# Patient Record
Sex: Male | Born: 1965
Health system: Southern US, Community
[De-identification: ages and names within clinical notes are randomized; demographics above are authoritative.]

## PROBLEM LIST (undated history)

## (undated) DIAGNOSIS — R197 Diarrhea, unspecified: Secondary | ICD-10-CM

## (undated) DIAGNOSIS — N400 Enlarged prostate without lower urinary tract symptoms: Secondary | ICD-10-CM

## (undated) DIAGNOSIS — Z8659 Personal history of other mental and behavioral disorders: Secondary | ICD-10-CM

## (undated) DIAGNOSIS — E785 Hyperlipidemia, unspecified: Secondary | ICD-10-CM

## (undated) DIAGNOSIS — F32A Depression, unspecified: Secondary | ICD-10-CM

## (undated) DIAGNOSIS — I10 Essential (primary) hypertension: Secondary | ICD-10-CM

## (undated) DIAGNOSIS — M545 Low back pain, unspecified: Secondary | ICD-10-CM

## (undated) DIAGNOSIS — F419 Anxiety disorder, unspecified: Secondary | ICD-10-CM

## (undated) DIAGNOSIS — R112 Nausea with vomiting, unspecified: Secondary | ICD-10-CM

## (undated) DIAGNOSIS — F329 Major depressive disorder, single episode, unspecified: Secondary | ICD-10-CM

## (undated) DIAGNOSIS — G4733 Obstructive sleep apnea (adult) (pediatric): Secondary | ICD-10-CM

## (undated) DIAGNOSIS — Z9889 Other specified postprocedural states: Secondary | ICD-10-CM

## (undated) DIAGNOSIS — G8929 Other chronic pain: Secondary | ICD-10-CM

## (undated) DIAGNOSIS — IMO0002 Reserved for concepts with insufficient information to code with codable children: Secondary | ICD-10-CM

## (undated) DIAGNOSIS — Z8489 Family history of other specified conditions: Secondary | ICD-10-CM

## (undated) DIAGNOSIS — K219 Gastro-esophageal reflux disease without esophagitis: Secondary | ICD-10-CM

## (undated) DIAGNOSIS — Z9989 Dependence on other enabling machines and devices: Secondary | ICD-10-CM

## (undated) HISTORY — DX: Other chronic pain: G89.29

## (undated) HISTORY — DX: Low back pain: M54.5

## (undated) HISTORY — DX: Low back pain, unspecified: M54.50

---
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
## 2001-09-01 HISTORY — PX: APPENDECTOMY: SHX54

## 2007-03-25 ENCOUNTER — Ambulatory Visit: Payer: Self-pay | Admitting: Cardiovascular Disease

## 2007-03-25 ENCOUNTER — Inpatient Hospital Stay (HOSPITAL_COMMUNITY): Admission: EM | Admit: 2007-03-25 | Discharge: 2007-03-27 | Payer: Self-pay | Admitting: Emergency Medicine

## 2007-03-26 ENCOUNTER — Encounter (INDEPENDENT_AMBULATORY_CARE_PROVIDER_SITE_OTHER): Payer: Self-pay | Admitting: *Deleted

## 2007-03-26 HISTORY — PX: TRANSTHORACIC ECHOCARDIOGRAM: SHX275

## 2007-03-30 ENCOUNTER — Ambulatory Visit: Payer: Self-pay | Admitting: Internal Medicine

## 2007-03-30 LAB — CONVERTED CEMR LAB
Hemoglobin, Urine: NEGATIVE
Nitrite: NEGATIVE
Urine Glucose: NEGATIVE mg/dL
WBC, UA: NONE SEEN cells/hpf

## 2007-04-06 ENCOUNTER — Ambulatory Visit: Payer: Self-pay | Admitting: Pulmonary Disease

## 2007-04-13 ENCOUNTER — Ambulatory Visit: Payer: Self-pay | Admitting: Internal Medicine

## 2007-04-28 ENCOUNTER — Ambulatory Visit (HOSPITAL_BASED_OUTPATIENT_CLINIC_OR_DEPARTMENT_OTHER): Admission: RE | Admit: 2007-04-28 | Discharge: 2007-04-28 | Payer: Self-pay | Admitting: Pulmonary Disease

## 2007-04-28 ENCOUNTER — Encounter: Payer: Self-pay | Admitting: Pulmonary Disease

## 2007-05-07 ENCOUNTER — Ambulatory Visit: Payer: Self-pay | Admitting: Pulmonary Disease

## 2007-05-12 ENCOUNTER — Ambulatory Visit: Payer: Self-pay | Admitting: Pulmonary Disease

## 2007-05-28 DIAGNOSIS — F32A Depression, unspecified: Secondary | ICD-10-CM | POA: Insufficient documentation

## 2007-05-28 DIAGNOSIS — Z87448 Personal history of other diseases of urinary system: Secondary | ICD-10-CM | POA: Insufficient documentation

## 2007-05-28 DIAGNOSIS — I10 Essential (primary) hypertension: Secondary | ICD-10-CM | POA: Insufficient documentation

## 2007-05-28 DIAGNOSIS — F329 Major depressive disorder, single episode, unspecified: Secondary | ICD-10-CM

## 2007-05-28 DIAGNOSIS — F411 Generalized anxiety disorder: Secondary | ICD-10-CM | POA: Insufficient documentation

## 2007-05-28 DIAGNOSIS — Z8739 Personal history of other diseases of the musculoskeletal system and connective tissue: Secondary | ICD-10-CM | POA: Insufficient documentation

## 2007-05-28 DIAGNOSIS — J301 Allergic rhinitis due to pollen: Secondary | ICD-10-CM | POA: Insufficient documentation

## 2007-05-29 ENCOUNTER — Encounter: Payer: Self-pay | Admitting: Internal Medicine

## 2007-05-29 DIAGNOSIS — G4733 Obstructive sleep apnea (adult) (pediatric): Secondary | ICD-10-CM | POA: Insufficient documentation

## 2007-06-22 ENCOUNTER — Ambulatory Visit: Payer: Self-pay | Admitting: Pulmonary Disease

## 2007-07-13 ENCOUNTER — Ambulatory Visit: Payer: Self-pay | Admitting: Internal Medicine

## 2007-07-13 DIAGNOSIS — J309 Allergic rhinitis, unspecified: Secondary | ICD-10-CM | POA: Insufficient documentation

## 2007-07-13 DIAGNOSIS — R42 Dizziness and giddiness: Secondary | ICD-10-CM | POA: Insufficient documentation

## 2007-09-16 ENCOUNTER — Encounter: Payer: Self-pay | Admitting: Internal Medicine

## 2007-10-14 ENCOUNTER — Ambulatory Visit: Payer: Self-pay | Admitting: Internal Medicine

## 2007-10-21 ENCOUNTER — Telehealth: Payer: Self-pay | Admitting: Pulmonary Disease

## 2007-11-23 ENCOUNTER — Ambulatory Visit: Payer: Self-pay | Admitting: Internal Medicine

## 2008-01-08 ENCOUNTER — Emergency Department (HOSPITAL_COMMUNITY): Admission: EM | Admit: 2008-01-08 | Discharge: 2008-01-08 | Payer: Self-pay | Admitting: Emergency Medicine

## 2008-01-10 ENCOUNTER — Telehealth (INDEPENDENT_AMBULATORY_CARE_PROVIDER_SITE_OTHER): Payer: Self-pay | Admitting: *Deleted

## 2008-01-11 ENCOUNTER — Encounter: Payer: Self-pay | Admitting: Endocrinology

## 2008-01-11 ENCOUNTER — Ambulatory Visit: Payer: Self-pay | Admitting: Internal Medicine

## 2008-01-11 DIAGNOSIS — K1370 Unspecified lesions of oral mucosa: Secondary | ICD-10-CM | POA: Insufficient documentation

## 2008-01-11 DIAGNOSIS — B009 Herpesviral infection, unspecified: Secondary | ICD-10-CM | POA: Insufficient documentation

## 2008-02-23 ENCOUNTER — Ambulatory Visit: Payer: Self-pay | Admitting: Internal Medicine

## 2008-02-24 ENCOUNTER — Encounter: Payer: Self-pay | Admitting: Internal Medicine

## 2008-03-09 ENCOUNTER — Telehealth: Payer: Self-pay | Admitting: Internal Medicine

## 2008-04-12 ENCOUNTER — Ambulatory Visit: Payer: Self-pay | Admitting: Internal Medicine

## 2008-04-12 DIAGNOSIS — L989 Disorder of the skin and subcutaneous tissue, unspecified: Secondary | ICD-10-CM | POA: Insufficient documentation

## 2008-04-12 LAB — CONVERTED CEMR LAB
ALT: 24 units/L (ref 0–53)
BUN: 12 mg/dL (ref 6–23)
Basophils Relative: 0.6 % (ref 0.0–3.0)
Bilirubin Urine: NEGATIVE
Bilirubin, Direct: 0.1 mg/dL (ref 0.0–0.3)
CO2: 30 meq/L (ref 19–32)
Chloride: 103 meq/L (ref 96–112)
Creatinine, Ser: 1.3 mg/dL (ref 0.4–1.5)
Direct LDL: 146.3 mg/dL
GFR calc Af Amer: 78 mL/min
GFR calc non Af Amer: 65 mL/min
HCT: 42.6 % (ref 39.0–52.0)
HDL: 37.2 mg/dL — ABNORMAL LOW (ref >39.0)
Hemoglobin, Urine: NEGATIVE
Ketones, ur: NEGATIVE mg/dL
Monocytes Relative: 9.6 % (ref 3.0–12.0)
Neutrophils Relative %: 62.8 % (ref 43.0–77.0)
PSA: 0.7 ng/mL (ref 0.10–4.00)
Platelets: 280 10*3/uL (ref 150–400)
Potassium: 4.5 meq/L (ref 3.5–5.1)
RBC: 5.23 M/uL (ref 4.22–5.81)
RDW: 12.7 % (ref 11.5–14.6)
Specific Gravity, Urine: 1.02 (ref 1.000–1.03)
TSH: 1.04 microintl units/mL (ref 0.35–5.50)
Total Protein, Urine: NEGATIVE mg/dL
Total Protein: 7.5 g/dL (ref 6.0–8.3)
Triglycerides: 125 mg/dL (ref 0–149)
Urine Glucose: NEGATIVE mg/dL

## 2008-04-21 ENCOUNTER — Encounter: Payer: Self-pay | Admitting: Internal Medicine

## 2008-11-09 ENCOUNTER — Telehealth: Payer: Self-pay | Admitting: Internal Medicine

## 2008-11-22 ENCOUNTER — Telehealth (INDEPENDENT_AMBULATORY_CARE_PROVIDER_SITE_OTHER): Payer: Self-pay | Admitting: *Deleted

## 2008-12-18 ENCOUNTER — Telehealth: Payer: Self-pay | Admitting: Internal Medicine

## 2008-12-25 ENCOUNTER — Telehealth (INDEPENDENT_AMBULATORY_CARE_PROVIDER_SITE_OTHER): Payer: Self-pay | Admitting: *Deleted

## 2008-12-27 ENCOUNTER — Telehealth (INDEPENDENT_AMBULATORY_CARE_PROVIDER_SITE_OTHER): Payer: Self-pay | Admitting: *Deleted

## 2009-01-11 ENCOUNTER — Telehealth: Payer: Self-pay | Admitting: Internal Medicine

## 2009-02-08 ENCOUNTER — Ambulatory Visit: Payer: Self-pay | Admitting: Internal Medicine

## 2009-02-08 DIAGNOSIS — R609 Edema, unspecified: Secondary | ICD-10-CM | POA: Insufficient documentation

## 2009-04-09 ENCOUNTER — Ambulatory Visit: Payer: Self-pay | Admitting: Internal Medicine

## 2009-04-09 LAB — CONVERTED CEMR LAB
ALT: 21 units/L (ref 0–53)
AST: 18 units/L (ref 0–37)
Basophils Absolute: 0.1 10*3/uL (ref 0.0–0.1)
Bilirubin Urine: NEGATIVE
Bilirubin, Direct: 0.2 mg/dL (ref 0.0–0.3)
CO2: 32 meq/L (ref 19–32)
Chloride: 105 meq/L (ref 96–112)
Cholesterol: 221 mg/dL — ABNORMAL HIGH (ref 0–200)
Eosinophils Relative: 3 % (ref 0.0–5.0)
GFR calc non Af Amer: 59.04 mL/min (ref >60)
HDL: 37.6 mg/dL — ABNORMAL LOW (ref >39.00)
Lymphocytes Relative: 21.4 % (ref 12.0–46.0)
Monocytes Relative: 8 % (ref 3.0–12.0)
Neutro Abs: 4.8 10*3/uL (ref 1.4–7.7)
Potassium: 4.2 meq/L (ref 3.5–5.1)
RDW: 12.3 % (ref 11.5–14.6)
Total Bilirubin: 0.9 mg/dL (ref 0.3–1.2)
Total Protein, Urine: NEGATIVE mg/dL
Urine Glucose: NEGATIVE mg/dL
WBC: 7.2 10*3/uL (ref 4.5–10.5)

## 2009-04-11 ENCOUNTER — Ambulatory Visit: Payer: Self-pay | Admitting: Internal Medicine

## 2009-04-11 DIAGNOSIS — R6882 Decreased libido: Secondary | ICD-10-CM | POA: Insufficient documentation

## 2009-04-12 LAB — CONVERTED CEMR LAB: Testosterone: 208.09 ng/dL — ABNORMAL LOW (ref 350.00–890.00)

## 2009-04-16 ENCOUNTER — Telehealth: Payer: Self-pay | Admitting: Internal Medicine

## 2009-04-19 ENCOUNTER — Encounter: Payer: Self-pay | Admitting: Internal Medicine

## 2009-04-20 ENCOUNTER — Ambulatory Visit: Payer: Self-pay | Admitting: Internal Medicine

## 2009-04-20 DIAGNOSIS — E291 Testicular hypofunction: Secondary | ICD-10-CM | POA: Insufficient documentation

## 2009-04-20 LAB — CONVERTED CEMR LAB
FSH: 2.4 milliintl units/mL (ref 1.4–18.1)
LH: 2.87 milliintl units/mL (ref 1.50–9.30)

## 2009-04-23 ENCOUNTER — Encounter: Payer: Self-pay | Admitting: Internal Medicine

## 2009-04-25 ENCOUNTER — Encounter: Payer: Self-pay | Admitting: Internal Medicine

## 2009-04-26 LAB — CONVERTED CEMR LAB
Sex Hormone Binding: 16 nmol/L (ref 13–71)
Testosterone-% Free: 2.7 % (ref 1.6–2.9)
Testosterone: 163.15 ng/dL — ABNORMAL LOW (ref 350–890)

## 2009-05-15 ENCOUNTER — Telehealth: Payer: Self-pay | Admitting: Endocrinology

## 2009-05-15 ENCOUNTER — Ambulatory Visit: Payer: Self-pay | Admitting: Endocrinology

## 2009-05-15 DIAGNOSIS — E230 Hypopituitarism: Secondary | ICD-10-CM | POA: Insufficient documentation

## 2009-05-15 LAB — CONVERTED CEMR LAB
Iron: 73 ug/dL (ref 42–165)
Transferrin: 248.5 mg/dL (ref 212.0–360.0)

## 2009-05-17 ENCOUNTER — Telehealth: Payer: Self-pay | Admitting: Endocrinology

## 2009-05-31 ENCOUNTER — Telehealth: Payer: Self-pay | Admitting: Endocrinology

## 2009-06-01 ENCOUNTER — Telehealth: Payer: Self-pay | Admitting: Endocrinology

## 2009-06-01 ENCOUNTER — Ambulatory Visit: Payer: Self-pay | Admitting: Endocrinology

## 2009-06-01 LAB — CONVERTED CEMR LAB
BUN: 20 mg/dL (ref 6–23)
Creatinine, Ser: 1.4 mg/dL (ref 0.4–1.5)

## 2009-06-03 ENCOUNTER — Encounter: Admission: RE | Admit: 2009-06-03 | Discharge: 2009-06-03 | Payer: Self-pay | Admitting: Endocrinology

## 2009-06-04 ENCOUNTER — Telehealth: Payer: Self-pay | Admitting: Internal Medicine

## 2009-06-11 ENCOUNTER — Encounter: Admission: RE | Admit: 2009-06-11 | Discharge: 2009-06-11 | Payer: Self-pay | Admitting: Endocrinology

## 2009-06-12 ENCOUNTER — Ambulatory Visit: Payer: Self-pay | Admitting: Endocrinology

## 2009-06-13 LAB — CONVERTED CEMR LAB: Testosterone: 292.39 ng/dL — ABNORMAL LOW (ref 350.00–890.00)

## 2009-07-12 ENCOUNTER — Ambulatory Visit: Payer: Self-pay | Admitting: Endocrinology

## 2009-07-12 LAB — CONVERTED CEMR LAB: Testosterone: 352.25 ng/dL (ref 350.00–890.00)

## 2009-07-13 ENCOUNTER — Ambulatory Visit: Payer: Self-pay | Admitting: Internal Medicine

## 2009-07-13 DIAGNOSIS — R5383 Other fatigue: Secondary | ICD-10-CM

## 2009-07-13 DIAGNOSIS — R5381 Other malaise: Secondary | ICD-10-CM | POA: Insufficient documentation

## 2009-10-09 ENCOUNTER — Ambulatory Visit: Payer: Self-pay | Admitting: Internal Medicine

## 2009-10-09 DIAGNOSIS — E785 Hyperlipidemia, unspecified: Secondary | ICD-10-CM | POA: Insufficient documentation

## 2009-10-09 LAB — CONVERTED CEMR LAB
Cholesterol: 203 mg/dL — ABNORMAL HIGH (ref 0–200)
Direct LDL: 145.2 mg/dL
HDL: 40.1 mg/dL (ref >39.00)
Total CHOL/HDL Ratio: 5
Triglycerides: 165 mg/dL — ABNORMAL HIGH (ref 0.0–149.0)

## 2009-11-06 ENCOUNTER — Ambulatory Visit: Payer: Self-pay | Admitting: Internal Medicine

## 2009-11-06 LAB — CONVERTED CEMR LAB
ALT: 26 units/L (ref 0–53)
AST: 21 units/L (ref 0–37)
Cholesterol: 146 mg/dL (ref 0–200)
HDL: 48.1 mg/dL (ref >39.00)
Total CHOL/HDL Ratio: 3
Triglycerides: 118 mg/dL (ref 0.0–149.0)
VLDL: 23.6 mg/dL (ref 0.0–40.0)

## 2009-11-13 ENCOUNTER — Telehealth: Payer: Self-pay | Admitting: Internal Medicine

## 2010-01-03 ENCOUNTER — Ambulatory Visit: Payer: Self-pay | Admitting: Endocrinology

## 2010-01-03 LAB — CONVERTED CEMR LAB: Testosterone: 372.9 ng/dL (ref 350.00–890.00)

## 2010-02-13 ENCOUNTER — Encounter: Payer: Self-pay | Admitting: Internal Medicine

## 2010-02-14 ENCOUNTER — Ambulatory Visit: Payer: Self-pay | Admitting: Internal Medicine

## 2010-04-26 ENCOUNTER — Telehealth: Payer: Self-pay | Admitting: Internal Medicine

## 2010-05-14 ENCOUNTER — Encounter: Payer: Self-pay | Admitting: Internal Medicine

## 2010-06-10 ENCOUNTER — Ambulatory Visit: Payer: Self-pay | Admitting: Pulmonary Disease

## 2010-10-02 NOTE — Assessment & Plan Note (Signed)
Summary: needs new c-pap/jd   Visit Type:  Initial Consult Copy to:  Dr Oliver Barre Primary Heaton Sarin/Referring Alejandro Adcox:  Corwin Levins MD  CC:  Pt here to become re-established. Needs supplies for CPAP.  History of Present Illness: 45/M , originally from from [place] to [place]  with severe obstructive sleep apnea .  PSG in 2008 showed AHI 29/h, wt was 252 lbs, CPAP was initiated at 12 cm based on autotitration data, then lowered to 10 cm  His usual bedtime is around 11 p.m. to midnight.  He  generally sleeps on his side.  He used to take a couple of hours sometimes to fall asleep, but now that he takes Ativan at night, his sleep latency is considerably decreased.  He wakes up 3  times occasionally due to back pain but mostly for bathroom visits.  He denies any postvoid sleep latency.  He finally gets out of bed at around 7 a.m. feeling tired, sleepy, with an occasional headache.  He has gained about 35 pounds since his last visit in 2008. Epworth Sleepiness Score 11/24 He has switched from [facility] to [facility] , since it is cheaper for him to obtain supplies. Wife has not noted snoring on current settings.  Preventive Screening-Counseling & Management  Alcohol-Tobacco     Smoking Status: never    What time do you typically go to bed?(between what hours): 11-12  How long does it take you to fall asleep? 5 min  How many times during the night do you wake up? 3x  What time do you get out of bed to start your day? 9:00 am  Do you drive or operate heavy machinery in your occupation? no  How much has your weight changed (up or down) over the past two years? (in pounds): 30 lbs increase  Have you ever had a sleep study before?  If yes,when and where: yes-2008  Do you currently use CPAP ? If so , at what pressure? yes-10  Do you wear oxygen at any time? If yes, how many liters per minute? no Current Medications (verified): 1)  Benazepril Hcl 40 Mg  Tabs (Benazepril Hcl) .Marland Kitchen.. 1  By Mouth Once Daily 2)  Cymbalta 60 Mg  Cpep (Duloxetine Hcl) .Marland Kitchen.. 1 By Mouth Once Daily 3)  Amlodipine Besylate 10 Mg  Tabs (Amlodipine Besylate) .... 1/2  By Mouth Once Daily 4)  Lorazepam 2 Mg  Tabs (Lorazepam) .... 1/2 By Mouth Two Times A Day As Needed 5)  Bystolic 10 Mg Tabs (Nebivolol Hcl) .Marland Kitchen.. 1 By Mouth Once Daily 6)  Hydrochlorothiazide 25 Mg Tabs (Hydrochlorothiazide) .... 1/2 By Mouth Once Daily 7)  Vitamin D3 .... Once Daily 8)  Clomid 50 Mg Tabs (Clomiphene Citrate) .... 1/2 Tab Qd 9)  Omega 3, 6, 9 10)  Multivitamin .Marland Kitchen.. 1 Daily 11)  Pravachol 40 Mg Tabs (Pravastatin Sodium) .Marland Kitchen.. 1 By Mouth Once Daily  Allergies (verified): No Known Drug Allergies  Past History:  Past Medical History: Last updated: 10/09/2009 Depression Hypertension Anxiety/panic UTI, hx of Chronic back pain, hx of Obstructive Sleep Apnea- CPAP Allergic rhinitis Hyperlipidemia  Past Surgical History: Last updated: 05/28/2007 Appendectomy  Family History: Last updated: 05/15/2009 Family History of CAD Male 1st degree relative Family History of Stroke M 1st degree relative Family History Diabetes 1st degree relative Family History Hypertension Family History High cholesterol father with CAD , CHF, s/p stent father with anaplastic thyroid cancer no testosterone or pituitary probs  Social History: Last updated: 10/14/2007  Never Smoked Alcohol use-yes - rare Married no children disabled - LBP and anxiety  Review of Systems       The patient complains of anxiety and depression.  The patient denies shortness of breath with activity, shortness of breath at rest, productive cough, non-productive cough, coughing up blood, chest pain, irregular heartbeats, acid heartburn, indigestion, loss of appetite, weight change, abdominal pain, difficulty swallowing, sore throat, tooth/dental problems, headaches, nasal congestion/difficulty breathing through nose, sneezing, itching, ear ache, hand/feet  swelling, joint stiffness or pain, rash, change in color of mucus, and fever.    Vital Signs:  Patient profile:   45 year old male Height:      67 inches Weight:      287 pounds O2 Sat:      94 % on Room air Temp:     98.7 degrees F oral Pulse rate:   67 / minute BP sitting:   122 / 86  (right arm) Cuff size:   large  Vitals Entered By: Zackery Barefoot CMA (June 10, 2010 4:26 PM)  O2 Flow:  Room air CC: Pt here to become re-established. Needs supplies for CPAP Comments Medications reviewed with patient Verified contact number and pharmacy with patient Zackery Barefoot [facility]  June 10, 2010 4:26 PM    Physical Exam  Additional Exam:  wt 287 June 10, 2010 Gen. Pleasant, well-nourished, in no distress, normal affect ENT - no lesions, no post nasal drip, class 3 airway Neck: No JVD, no thyromegaly, no carotid bruits Lungs: no use of accessory muscles, no dullness to percussion, clear without rales or rhonchi  Cardiovascular: Rhythm regular, heart sounds  normal, no murmurs or gallops, no peripheral edema Abdomen: soft and non-tender, no hepatosplenomegaly, BS normal. Musculoskeletal: No deformities, no cyanosis or clubbing Neuro:  alert, non focal     Impression & Recommendations:  Problem # 1:  SLEEP APNEA, OBSTRUCTIVE (ICD-327.23)  The pathophysiology of obstructive sleep apnea, it's cardiovascular consequences and modes of treatment including CPAP were discussed with the patient in great detail.  Compliance encouraged, wt loss emphasized, asked to avoid meds with sedative side effects, cautioned against driving when sleepy.  ct CPAP 10 cm, obtian download & increase if needed - he may need higher pressure due to wt gain CMN fille dout.  Orders: Consultation Level III (16109)  Patient Instructions: 1)  Copy sent to: dr Jonny Ruiz 2)  Please schedule a follow-up appointment in 1 year. 3)  Send in the chip to [facility] so I can look at download 4)  New mask  will be provided   Not Administered:    Pneumonia Vaccine not given due to: declined    Influenza Vaccine not given due to: declined   Appended Document: Orders Update    Clinical Lists Changes  Orders: Added new Referral order of DME Referral (DME) - Signed

## 2010-10-02 NOTE — Letter (Signed)
Summary: Physician's statement/[facility]  Physician's statement/[facility]   Imported By: Lester Reinbeck 02/15/2010 08:27:37  _____________________________________________________________________  External Attachment:    Type:   Image     Comment:   External Document

## 2010-10-02 NOTE — Letter (Signed)
Summary: LMN/[facility]  LMN/[facility]   Imported By: Lester  07/30/2010 12:49:27  _____________________________________________________________________  External Attachment:    Type:   Image     Comment:   External Document

## 2010-10-02 NOTE — Assessment & Plan Note (Signed)
Summary: 6 mo ov /nws $50   Vital Signs:  Patient profile:   45 year old male Height:      67 inches Weight:      282 pounds BMI:     44.33 O2 Sat:      96 % on Room air Temp:     97 degrees F oral Pulse rate:   64 / minute BP sitting:   118 / 90  (left arm) Cuff size:   large  Vitals Entered ByZella Ball Ewing (October 09, 2009 9:08 AM)  O2 Flow:  Room air CC: 6 Mo ROV/RE   CC:  6 Mo ROV/RE.  History of Present Illness: overall doing well, no complaints, Pt denies CP, sob, doe, wheezing, orthopnea, pnd, worsening LE edema, palps, dizziness or syncope  Pt denies new neuro symptoms such as headache, facial or extremity weakness   Trying to follow low chol diet but occasinally admits to noncompliacne.  Depressive  symptoms improved and less anxious, no panic recent although remains an issue.  No suicidal ideation.    Problems Prior to Update: 1)  Hyperlipidemia  (ICD-272.4) 2)  Fatigue  (ICD-780.79) 3)  Hypopituitarism  (ICD-253.2) 4)  Hypogonadism  (ICD-257.2) 5)  Libido, Decreased  (ICD-799.81) 6)  Peripheral Edema  (ICD-782.3) 7)  Skin Lesion  (ICD-709.9) 8)  Preventive Health Care  (ICD-V70.0) 9)  Hsv  (ICD-054.9) 10)  Oral Ulcer  (ICD-528.9) 11)  Dizziness  (ICD-780.4) 12)  Allergic Rhinitis  (ICD-477.9) 13)  Family History Diabetes 1st Degree Relative  (ICD-V18.0) 14)  Family History of Cad Male 1st Degree Relative <50  (ICD-V17.3) 15)  Sleep Apnea, Obstructive  (ICD-327.23) 16)  Back Pain, Chronic, Hx of  (ICD-V13.5) 17)  Anxiety  (ICD-300.00) 18)  Urinary Tract Infection, Hx of  (ICD-V13.00) 19)  Hay Fever  (ICD-477.0) 20)  Hypertension  (ICD-401.9) 21)  Depression  (ICD-311)  Medications Prior to Update: 1)  Benazepril Hcl 40 Mg  Tabs (Benazepril Hcl) .Marland Kitchen.. 1 By Mouth Once Daily 2)  Cymbalta 60 Mg  Cpep (Duloxetine Hcl) .Marland Kitchen.. 1 By Mouth Once Daily 3)  Amlodipine Besylate 10 Mg  Tabs (Amlodipine Besylate) .... 1/2  By Mouth Once Daily 4)  Lorazepam 2 Mg  Tabs  (Lorazepam) .... 1/2 By Mouth Two Times A Day As Needed 5)  Bystolic 10 Mg Tabs (Nebivolol Hcl) .Marland Kitchen.. 1 By Mouth Once Daily 6)  Hydrochlorothiazide 25 Mg Tabs (Hydrochlorothiazide) .... 1/2 By Mouth Once Daily 7)  Vitamin D3 .... Once Daily 8)  Clomid 50 Mg Tabs (Clomiphene Citrate) .... 1/2 Tab Qd 9)  Omega 3, 6, 9 10)  Multivitamin .Marland Kitchen.. 1 Daily  Current Medications (verified): 1)  Benazepril Hcl 40 Mg  Tabs (Benazepril Hcl) .Marland Kitchen.. 1 By Mouth Once Daily 2)  Cymbalta 60 Mg  Cpep (Duloxetine Hcl) .Marland Kitchen.. 1 By Mouth Once Daily 3)  Amlodipine Besylate 10 Mg  Tabs (Amlodipine Besylate) .... 1/2  By Mouth Once Daily 4)  Lorazepam 2 Mg  Tabs (Lorazepam) .... 1/2 By Mouth Two Times A Day As Needed 5)  Bystolic 10 Mg Tabs (Nebivolol Hcl) .Marland Kitchen.. 1 By Mouth Once Daily 6)  Hydrochlorothiazide 25 Mg Tabs (Hydrochlorothiazide) .... 1/2 By Mouth Once Daily 7)  Vitamin D3 .... Once Daily 8)  Clomid 50 Mg Tabs (Clomiphene Citrate) .... 1/2 Tab Qd 9)  Omega 3, 6, 9 10)  Multivitamin .Marland Kitchen.. 1 Daily 11)  Simvastatin 40 Mg Tabs (Simvastatin) .Marland Kitchen.. 1po Once Daily  Allergies (verified): No Known  Drug Allergies  Past History:  Past Surgical History: Last updated: 05/28/2007 Appendectomy  Social History: Last updated: 10/14/2007 Never Smoked Alcohol use-yes - rare Married no children disabled - LBP and anxiety  Risk Factors: Smoking Status: never (07/13/2007)  Past Medical History: Depression Hypertension Anxiety/panic UTI, hx of Chronic back pain, hx of Obstructive Sleep Apnea- CPAP Allergic rhinitis Hyperlipidemia  Review of Systems       all otherwise negative per pt -   Physical Exam  General:  alert and overweight-appearing.   Head:  normocephalic and atraumatic.   Eyes:  vision grossly intact, pupils equal, and pupils round.   Ears:  R ear normal and L ear normal.   Nose:  no external deformity and no nasal discharge.   Mouth:  no gingival abnormalities and pharynx pink and moist.     Neck:  supple and no masses.   Lungs:  normal respiratory effort and normal breath sounds.   Heart:  normal rate and regular rhythm.   Extremities:  no edema, no erythema  Psych:  not anxious appearing and not depressed appearing.     Impression & Recommendations:  Problem # 1:  HYPERTENSION (ICD-401.9)  His updated medication list for this problem includes:    Benazepril Hcl 40 Mg Tabs (Benazepril hcl) .Marland Kitchen... 1 by mouth once daily    Amlodipine Besylate 10 Mg Tabs (Amlodipine besylate) .Marland Kitchen... 1/2  by mouth once daily    Bystolic 10 Mg Tabs (Nebivolol hcl) .Marland Kitchen... 1 by mouth once daily    Hydrochlorothiazide 25 Mg Tabs (Hydrochlorothiazide) .Marland Kitchen... 1/2 by mouth once daily  BP today: 118/90 Prior BP: 130/86 (07/13/2009)  Labs Reviewed: K+: 4.2 (04/09/2009) Creat: : 1.4 (06/01/2009)   Chol: 221 (04/09/2009)   HDL: 37.60 (04/09/2009)   LDL: DEL (04/12/2008)   TG: 175.0 (04/09/2009) stable overall by hx and exam, ok to continue meds/tx as is   Problem # 2:  HYPERLIPIDEMIA (ICD-272.4)  Labs Reviewed: SGOT: 18 (04/09/2009)   SGPT: 21 (04/09/2009)   HDL:37.60 (04/09/2009), 37.2 (04/12/2008)  LDL:DEL (04/12/2008)  Chol:221 (04/09/2009), 205 (04/12/2008)  Trig:175.0 (04/09/2009), 125 (04/12/2008) to f/u lipids today, goal ldl les than 100;  consider simvastatin 40 mg per day  His updated medication list for this problem includes:    Simvastatin 40 Mg Tabs (Simvastatin) .Marland Kitchen... 1po once daily  Problem # 3:  DEPRESSION (ICD-311)  His updated medication list for this problem includes:    Cymbalta 60 Mg Cpep (Duloxetine hcl) .Marland Kitchen... 1 by mouth once daily    Lorazepam 2 Mg Tabs (Lorazepam) .Marland Kitchen... 1/2 by mouth two times a day as needed treat as above, f/u any worsening signs or symptoms   Complete Medication List: 1)  Benazepril Hcl 40 Mg Tabs (Benazepril hcl) .Marland Kitchen.. 1 by mouth once daily 2)  Cymbalta 60 Mg Cpep (Duloxetine hcl) .Marland Kitchen.. 1 by mouth once daily 3)  Amlodipine Besylate 10 Mg Tabs  (Amlodipine besylate) .... 1/2  by mouth once daily 4)  Lorazepam 2 Mg Tabs (Lorazepam) .... 1/2 by mouth two times a day as needed 5)  Bystolic 10 Mg Tabs (Nebivolol hcl) .Marland Kitchen.. 1 by mouth once daily 6)  Hydrochlorothiazide 25 Mg Tabs (Hydrochlorothiazide) .... 1/2 by mouth once daily 7)  Vitamin D3  .... Once daily 8)  Clomid 50 Mg Tabs (Clomiphene citrate) .... 1/2 tab qd 9)  Omega 3, 6, 9  10)  Multivitamin  .Marland Kitchen.. 1 daily 11)  Simvastatin 40 Mg Tabs (Simvastatin) .Marland Kitchen.. 1po once daily  Patient Instructions:  1)  Continue all previous medications as before this visit  2)  we will consider adding the simvastatin pending the cholesterol results 3)  Please schedule a follow-up appointment in 6 months with: 4)  CPX labs and: 5)  total testosterone 607.84 Prescriptions: LORAZEPAM 2 MG  TABS (LORAZEPAM) 1/2 by mouth two times a day as needed  #30 x 2   Entered and Authorized by:   Corwin Levins MD   Signed by:   Corwin Levins MD on 10/09/2009   Method used:   Print then Give to Patient   RxID:   [id number] HYDROCHLOROTHIAZIDE 25 MG TABS (HYDROCHLOROTHIAZIDE) 1/2 by mouth once daily  #90 x 3   Entered and Authorized by:   Corwin Levins MD   Signed by:   Corwin Levins MD on 10/09/2009   Method used:   Print then Give to Patient   RxID:   [id number] BYSTOLIC 10 MG TABS (NEBIVOLOL HCL) 1 by mouth once daily  #90 x 3   Entered and Authorized by:   Corwin Levins MD   Signed by:   Corwin Levins MD on 10/09/2009   Method used:   Print then Give to Patient   RxID:   [id number] AMLODIPINE BESYLATE 10 MG  TABS (AMLODIPINE BESYLATE) 1/2  by mouth once daily  #45 x 3   Entered and Authorized by:   Corwin Levins MD   Signed by:   Corwin Levins MD on 10/09/2009   Method used:   Print then Give to Patient   RxID:   [id number] CYMBALTA 60 MG  CPEP (DULOXETINE HCL) 1 by mouth once daily  #90 x 3   Entered and Authorized by:   Corwin Levins MD   Signed by:   Corwin Levins MD on 10/09/2009    Method used:   Print then Give to Patient   RxID:   [id number] BENAZEPRIL HCL 40 MG  TABS (BENAZEPRIL HCL) 1 by mouth once daily  #90 x 3   Entered and Authorized by:   Corwin Levins MD   Signed by:   Corwin Levins MD on 10/09/2009   Method used:   Print then Give to Patient   RxID:   [id number]

## 2010-10-02 NOTE — Letter (Signed)
Summary: Request for Sleep Study & LMN/[facility]  Request for Sleep Study & LMN/[facility]   Imported By: Sherian Rein 05/16/2010 13:23:58  _____________________________________________________________________  External Attachment:    Type:   Image     Comment:   External Document

## 2010-10-02 NOTE — Progress Notes (Signed)
Summary: alt RX  Phone Note Call from Patient Call back at Home Phone [phone]   Caller: Spouse Summary of Call: pt's spouse called stating that Simvastatin is causing stomach upset. pt is requesting alt medication. Initial call taken by: Margaret Pyle, CMA,  November 13, 2009 2:03 PM  Follow-up for Phone Call        ok to change to pravachol 40 once daily  - to robin to handle Follow-up by: Corwin Levins MD,  November 13, 2009 5:31 PM    New/Updated Medications: PRAVACHOL 40 MG TABS (PRAVASTATIN SODIUM) 1 by mouth once daily Prescriptions: PRAVACHOL 40 MG TABS (PRAVASTATIN SODIUM) 1 by mouth once daily  #30 x 11   Entered by:   Scharlene Gloss   Authorized by:   Corwin Levins MD   Signed by:   Scharlene Gloss on 11/14/2009   Method used:   F[facility]ed to ...       [facility]       [address]       [place], [place]  [address]       Ph: [phone]       Fax: [phone]   RxID:   [id number]   Appended Document: alt RX pt's spouse informed

## 2010-10-02 NOTE — Progress Notes (Signed)
Summary: Cymbalta  Phone Note From Pharmacy   [facility]: [facility] [phone] Call For: fax:  [phone]  Summary of Call: Byrd Hesselbach with the pharmacy above called requesting prescription refill for the patient on Cymbalta 40 Initial call taken by: Lucious Groves CMA,  April 26, 2010 2:05 PM  Follow-up for Phone Call        I have confirmed with patient this pharmacy is what he is now using.  Follow-up by: Zella Ball Ewing CMA Duncan Dull),  April 26, 2010 2:32 PM  Additional Follow-up for Phone Call Additional follow up Details #1::        cymbalta does not come in 40 mg - only in 30 and 60's;  ok to refill the 60's Additional Follow-up by: Corwin Levins MD,  April 26, 2010 2:39 PM    Prescriptions: CYMBALTA 60 MG  CPEP (DULOXETINE HCL) 1 by mouth once daily  #90 x 1   Entered by:   Scharlene Gloss CMA (AAMA)   Authorized by:   Corwin Levins MD   Signed by:   Scharlene Gloss CMA (AAMA) on 04/26/2010   Method used:   Printed then faxed to ...       [facility]       [address]       [place], [place]  [address]       Ph: [phone]       Fax: [phone]   RxID:   [id number]   Appended Document: Cymbalta Faxed hardcopy to [facility] [phone], [address]. [place], [place]

## 2010-10-02 NOTE — Assessment & Plan Note (Signed)
Summary: f/u appt/#/cd   Vital Signs:  Patient profile:   45 year old male Height:      67 inches (170.18 cm) Weight:      278.13 pounds (126.42 kg) O2 Sat:      96 % on Room air Temp:     98.0 degrees F (36.67 degrees C) oral Pulse rate:   64 / minute BP sitting:   124 / 84  (left arm) Cuff size:   large  Vitals Entered By: Josph Macho RMA (Jan 03, 2010 10:17 AM)  O2 Flow:  Room air CC: Follow-up visit/ pt is no longer taking Simvastatin/ CF   Referring Provider:  Dr Oliver Barre  CC:  Follow-up visit/ pt is no longer taking Simvastatin/ CF.  History of Present Illness: pt says he feels well except for decreased libido, but no erectile dysfunction.  Current Medications (verified): 1)  Benazepril Hcl 40 Mg  Tabs (Benazepril Hcl) .Marland Kitchen.. 1 By Mouth Once Daily 2)  Cymbalta 60 Mg  Cpep (Duloxetine Hcl) .Marland Kitchen.. 1 By Mouth Once Daily 3)  Amlodipine Besylate 10 Mg  Tabs (Amlodipine Besylate) .... 1/2  By Mouth Once Daily 4)  Lorazepam 2 Mg  Tabs (Lorazepam) .... 1/2 By Mouth Two Times A Day As Needed 5)  Bystolic 10 Mg Tabs (Nebivolol Hcl) .Marland Kitchen.. 1 By Mouth Once Daily 6)  Hydrochlorothiazide 25 Mg Tabs (Hydrochlorothiazide) .... 1/2 By Mouth Once Daily 7)  Vitamin D3 .... Once Daily 8)  Clomid 50 Mg Tabs (Clomiphene Citrate) .... 1/2 Tab Qd 9)  Omega 3, 6, 9 10)  Multivitamin .Marland Kitchen.. 1 Daily 11)  Simvastatin 40 Mg Tabs (Simvastatin) .Marland Kitchen.. 1po Once Daily 12)  Pravachol 40 Mg Tabs (Pravastatin Sodium) .Marland Kitchen.. 1 By Mouth Once Daily  Allergies (verified): No Known Drug Allergies  Past History:  Past Medical History: Last updated: 10/09/2009 Depression Hypertension Anxiety/panic UTI, hx of Chronic back pain, hx of Obstructive Sleep Apnea- CPAP Allergic rhinitis Hyperlipidemia  Review of Systems       pt has fatigue  Physical Exam  Genitalia:  Normal external male genitalia with no urethral discharge.  Additional Exam:  Testosterone              372.90 ng/dL                 045.40-981.19 PSA-Hyb                   0.72 ng/mL                  0.10-4.00 Vitamin B12               500 pg/mL           Impression & Recommendations:  Problem # 1:  HYPOGONADISM (ICD-257.2) well-controlled  Other Orders: TLB-Testosterone, Total (84403-TESTO) TLB-PSA (Prostate Specific Antigen) (84153-PSA) TLB-B12 + Folate Pnl ([id number]_95621-H08/MVH) Est. Patient Level III (84696)  Patient Instructions: 1)  tests are being ordered for you today.  a few days after the test(s), please call [phone] to hear your test results. 2)  pending the test results, please continue clomiphine 1/2 of 50 mg once daily. 3)  Please schedule a follow-up appointment in 1 year. 4)  (update: i left message on phone-tree:  rx as we discussed)

## 2010-10-09 ENCOUNTER — Ambulatory Visit (INDEPENDENT_AMBULATORY_CARE_PROVIDER_SITE_OTHER): Payer: No Typology Code available for payment source | Admitting: Internal Medicine

## 2010-10-09 ENCOUNTER — Ambulatory Visit (INDEPENDENT_AMBULATORY_CARE_PROVIDER_SITE_OTHER)
Admission: RE | Admit: 2010-10-09 | Discharge: 2010-10-09 | Disposition: A | Discharge status: Home / Self Care | Payer: No Typology Code available for payment source | Source: Ambulatory Visit | Attending: Internal Medicine | Admitting: Internal Medicine

## 2010-10-09 ENCOUNTER — Encounter: Payer: Self-pay | Admitting: Internal Medicine

## 2010-10-09 ENCOUNTER — Other Ambulatory Visit: Payer: Self-pay | Admitting: Internal Medicine

## 2010-10-09 DIAGNOSIS — J209 Acute bronchitis, unspecified: Secondary | ICD-10-CM

## 2010-10-09 DIAGNOSIS — F411 Generalized anxiety disorder: Secondary | ICD-10-CM

## 2010-10-09 DIAGNOSIS — I10 Essential (primary) hypertension: Secondary | ICD-10-CM

## 2010-10-17 NOTE — Assessment & Plan Note (Signed)
Summary: cold wont go away   Vital Signs:  Patient profile:   45 year old male Height:      67 inches Weight:      277.13 pounds BMI:     43.56 O2 Sat:      94 % on Room air Temp:     99.2 degrees F oral Pulse rate:   60 / minute BP sitting:   100 / 68  (left arm) Cuff size:   large  Vitals Entered By: Zella Ball Ewing CMA (AAMA) (October 09, 2010 3:28 PM)  O2 Flow:  Room air CC: Congestion, cough, ear pain and fever/RE   Primary Care Provider:  Corwin Levins MD  CC:  Congestion, cough, and ear pain and fever/RE.  History of Present Illness: here with c/o 10 days ongoing acute illness, mild to mod but grad worsening HA, fever, ear pain, ST, and not prod cough greenish sputum and mild sob;  Pt denies CP, wheezing, orthopnea, pnd, worsening LE edema, palps, dizziness or syncope  .  Pt denies new neuro symptoms such as headache, facial or extremity weakness  Pt denies polydipsia, polyuria  Overall good compliance with meds, trying to follow low chol diet, wt stable, little excercise however .  Overall good compliance with meds, and good tolerability.  No recent wt loss, night sweats, loss of appetite or other constitutional symptoms  Denies worsening depressive symptoms, suicidal ideation, or panic.    Preventive Screening-Counseling & Management      Drug Use:  no.    Problems Prior to Update: 1)  Bronchitis-acute  (ICD-466.0) 2)  Special Screening Malignant Neoplasm of Prostate  (ICD-V76.44) 3)  Hyperlipidemia  (ICD-272.4) 4)  Fatigue  (ICD-780.79) 5)  Hypopituitarism  (ICD-253.2) 6)  Hypogonadism  (ICD-257.2) 7)  Libido, Decreased  (ICD-799.81) 8)  Peripheral Edema  (ICD-782.3) 9)  Skin Lesion  (ICD-709.9) 10)  Preventive Health Care  (ICD-V70.0) 11)  Hsv  (ICD-054.9) 12)  Oral Ulcer  (ICD-528.9) 13)  Dizziness  (ICD-780.4) 14)  Allergic Rhinitis  (ICD-477.9) 15)  Family History Diabetes 1st Degree Relative  (ICD-V18.0) 16)  Family History of Cad Male 1st Degree Relative  <50  (ICD-V17.3) 17)  Sleep Apnea, Obstructive  (ICD-327.23) 18)  Back Pain, Chronic, Hx of  (ICD-V13.5) 19)  Anxiety  (ICD-300.00) 20)  Urinary Tract Infection, Hx of  (ICD-V13.00) 21)  Hay Fever  (ICD-477.0) 22)  Hypertension  (ICD-401.9) 23)  Depression  (ICD-311)  Medications Prior to Update: 1)  Benazepril Hcl 40 Mg  Tabs (Benazepril Hcl) .Marland Kitchen.. 1 By Mouth Once Daily 2)  Cymbalta 60 Mg  Cpep (Duloxetine Hcl) .Marland Kitchen.. 1 By Mouth Once Daily 3)  Amlodipine Besylate 10 Mg  Tabs (Amlodipine Besylate) .... 1/2  By Mouth Once Daily 4)  Lorazepam 2 Mg  Tabs (Lorazepam) .... 1/2 By Mouth Two Times A Day As Needed 5)  Bystolic 10 Mg Tabs (Nebivolol Hcl) .Marland Kitchen.. 1 By Mouth Once Daily 6)  Hydrochlorothiazide 25 Mg Tabs (Hydrochlorothiazide) .... 1/2 By Mouth Once Daily 7)  Vitamin D3 .... Once Daily 8)  Clomid 50 Mg Tabs (Clomiphene Citrate) .... 1/2 Tab Qd 9)  Omega 3, 6, 9 10)  Multivitamin .Marland Kitchen.. 1 Daily 11)  Pravachol 40 Mg Tabs (Pravastatin Sodium) .Marland Kitchen.. 1 By Mouth Once Daily  Current Medications (verified): 1)  Benazepril Hcl 40 Mg  Tabs (Benazepril Hcl) .Marland Kitchen.. 1 By Mouth Once Daily 2)  Cymbalta 60 Mg  Cpep (Duloxetine Hcl) .Marland Kitchen.. 1 By Mouth Once Daily  3)  Amlodipine Besylate 10 Mg  Tabs (Amlodipine Besylate) .... 1/2  By Mouth Once Daily 4)  Lorazepam 2 Mg  Tabs (Lorazepam) .... 1/2 By Mouth Two Times A Day As Needed 5)  Bystolic 10 Mg Tabs (Nebivolol Hcl) .Marland Kitchen.. 1 By Mouth Once Daily 6)  Hydrochlorothiazide 25 Mg Tabs (Hydrochlorothiazide) .... 1/2 By Mouth Once Daily 7)  Vitamin D3 .... Once Daily 8)  Clomid 50 Mg Tabs (Clomiphene Citrate) .... 1/2 Tab Qd 9)  Omega 3, 6, 9 10)  Multivitamin .Marland Kitchen.. 1 Daily 11)  Pravachol 40 Mg Tabs (Pravastatin Sodium) .Marland Kitchen.. 1 By Mouth Once Daily 12)  Levofloxacin 500 Mg Tabs (Levofloxacin) .Marland Kitchen.. 1po Once Daily 13)  Tussionex Pennkinetic Er 10-8 Mg/72ml Lqcr (Hydrocod Polst-Chlorphen Polst) .Marland Kitchen.. 1 Tsp By Mouth Two Times A Day As Needed  Allergies (verified): No Known  Drug Allergies  Past History:  Past Medical History: Last updated: 10/09/2009 Depression Hypertension Anxiety/panic UTI, hx of Chronic back pain, hx of Obstructive Sleep Apnea- CPAP Allergic rhinitis Hyperlipidemia  Past Surgical History: Last updated: 05/28/2007 Appendectomy  Social History: Last updated: 10/09/2010 Never Smoked Alcohol use-yes - rare Married no children disabled - LBP and anxiety Drug use-no  Risk Factors: Smoking Status: never (06/10/2010)  Social History: Never Smoked Alcohol use-yes - rare Married no children disabled - LBP and anxiety Drug use-no Drug Use:  no  Review of Systems       all otherwise negative per pt -    Physical Exam  General:  alert and overweight-appearing.  , mild to mod ill  Head:  normocephalic and atraumatic.   Eyes:  vision grossly intact, pupils equal, and pupils round.   Ears:  bilat tm's mild red, sinus nontender Nose:  nasal dischargemucosal pallor and mucosal edema.   Mouth:  pharyngeal erythema and fair dentition.   Neck:  supple and no masses.   Lungs:  normal respiratory effort and normal breath sounds.   Heart:  normal rate and regular rhythm.   Extremities:  no edema, no erythema  Psych:  not depressed appearing and slightly anxious.     Impression & Recommendations:  Problem # 1:  BRONCHITIS-ACUTE (ICD-466.0)  cant r/o pna - for  cxr;    His updated medication list for this problem includes:    Levofloxacin 500 Mg Tabs (Levofloxacin) .Marland Kitchen... 1po once daily    Tussionex Pennkinetic Er 10-8 Mg/42ml Lqcr (Hydrocod polst-chlorphen polst) .Marland Kitchen... 1 tsp by mouth two times a day as needed  Orders: T-2 View CXR, Same Day (71020.5TC)  Problem # 2:  HYPERTENSION (ICD-401.9)  His updated medication list for this problem includes:    Benazepril Hcl 40 Mg Tabs (Benazepril hcl) .Marland Kitchen... 1 by mouth once daily    Amlodipine Besylate 10 Mg Tabs (Amlodipine besylate) .Marland Kitchen... 1/2  by mouth once daily    Bystolic  10 Mg Tabs (Nebivolol hcl) .Marland Kitchen... 1 by mouth once daily    Hydrochlorothiazide 25 Mg Tabs (Hydrochlorothiazide) .Marland Kitchen... 1/2 by mouth once daily bordeline low today - ok to hold the HCT for 1 wk  Problem # 3:  ANXIETY (ICD-300.00)  His updated medication list for this problem includes:    Cymbalta 60 Mg Cpep (Duloxetine hcl) .Marland Kitchen... 1 by mouth once daily    Lorazepam 2 Mg Tabs (Lorazepam) .Marland Kitchen... 1/2 by mouth two times a day as needed  Discussed medication use and relaxation techniques.  stable overall by hx and exam, ok to continue meds/tx as is   Complete Medication List:  1)  Benazepril Hcl 40 Mg Tabs (Benazepril hcl) .Marland Kitchen.. 1 by mouth once daily 2)  Cymbalta 60 Mg Cpep (Duloxetine hcl) .Marland Kitchen.. 1 by mouth once daily 3)  Amlodipine Besylate 10 Mg Tabs (Amlodipine besylate) .... 1/2  by mouth once daily 4)  Lorazepam 2 Mg Tabs (Lorazepam) .... 1/2 by mouth two times a day as needed 5)  Bystolic 10 Mg Tabs (Nebivolol hcl) .Marland Kitchen.. 1 by mouth once daily 6)  Hydrochlorothiazide 25 Mg Tabs (Hydrochlorothiazide) .... 1/2 by mouth once daily 7)  Vitamin D3  .... Once daily 8)  Clomid 50 Mg Tabs (Clomiphene citrate) .... 1/2 tab qd 9)  Omega 3, 6, 9  10)  Multivitamin  .Marland Kitchen.. 1 daily 11)  Pravachol 40 Mg Tabs (Pravastatin sodium) .Marland Kitchen.. 1 by mouth once daily 12)  Levofloxacin 500 Mg Tabs (Levofloxacin) .Marland Kitchen.. 1po once daily 13)  Tussionex Pennkinetic Er 10-8 Mg/10ml Lqcr (Hydrocod polst-chlorphen polst) .Marland Kitchen.. 1 tsp by mouth two times a day as needed  Patient Instructions: 1)  Please take all new medications as prescribed 2)  Continue all previous medications as before this visit, except you can hold on taking the fluid pill for 1 wk 3)  Please go to Radiology in the basement level for your X-Ray today  4)  Please call the number on the [facility] for results of your testing  5)  Please schedule a follow-up appointment as needed. Prescriptions: Sandria Senter ER 10-8 MG/5ML LQCR (HYDROCOD POLST-CHLORPHEN  POLST) 1 tsp by mouth two times a day as needed  #6oz x 1   Entered and Authorized by:   Corwin Levins MD   Signed by:   Corwin Levins MD on 10/09/2010   Method used:   Print then Give to Patient   RxID:   [id number] LEVOFLOXACIN 500 MG TABS (LEVOFLOXACIN) 1po once daily  #10 x 0   Entered and Authorized by:   Corwin Levins MD   Signed by:   Corwin Levins MD on 10/09/2010   Method used:   Print then Give to Patient   RxID:   [id number]    Orders Added: 1)  T-2 View CXR, Same Day [71020.5TC] 2)  Est. Patient Level IV [84696]

## 2010-12-09 ENCOUNTER — Other Ambulatory Visit: Payer: Self-pay | Admitting: Internal Medicine

## 2011-01-14 NOTE — Discharge Summary (Signed)
NAME:  Darren [name], CREDIT NO.:  [id number]   MEDICAL RECORD NO.:  [id number]          PATIENT TYPE:  INP   LOCATION:  1424                         FACILITY:  [facility] [place]   PHYSICIAN:  Beckey Rutter, MD  DATE OF BIRTH:  1966/04/28   DATE OF ADMISSION:  03/25/2007  DATE OF DISCHARGE:  03/27/2007                               DISCHARGE SUMMARY   CHIEF COMPLAINT ON PRESENTATION:  Elevated blood pressure.   BRIEF HISTORY OF PRESENT ILLNESS:  This is a 45 years old gentleman sent  from the urologist's office because of elevated blood pressure.   HOSPITAL COURSE:  During hospitalization the patient was admitted to the  step-down unit.  He was started on labetalol drip and nitro as well with  minimal response in the blood pressure.  The patient then was given  Ativan and with that the blood pressure quickly trended towards normal.  It seems the patient is suffering from elevated blood pressure, mainly  secondary to the state of anxiety that he is experiencing.  The patient  is known to have a history of panic attacks and he was under a  psychiatrist's management for some time but since he moved down from [place] state to our state here he did not follow-up and he is not taking  medication for those panic attacks.   The patient then transferred to the floor and he is now stable for  discharge to follow up with his primary physician.  The patient does not  have a primary but he was recommended to follow up with the primary so  he can get his blood pressure under control.   It was also noted that he had some elevation in creatinine 1.49, but  secondary to his body habitus and the fact that he is a well-developed  man, I suspect this is likely his baseline and that will need to be  looked into as well and the patient and his wife are aware and agreeable  to this plan.   1. Overweight problem:  The patient was counseled to reduce weight      since he is obese now and with  that will come help to control his      blood pressure as well.   1. The second problem is the patient is obese with a thick neck and a      crowded oropharynx and he was snoring loudly on a daily basis.  I      suspect the patient had an element of  obstructive sleep apnea and      for that I recommended him to follow with a primary physician so he      can be sent for a sleep study.  Again the patient is aware and      agreeable to this plan.   1. Urinary tract infection:  The patient has a white blood count in      his urine and he was diagnosed in the urologist office.  I am not      sure why he would develop a urinary tract infection  being a male      with no obvious urological anomalies in his tract.  The patient's      plan is to follow up with his urologist and at this time he will be      discharged on ciprofloxacin 500 mg twice daily for three more days.      He was started on Cipro since admission and that will complete five      days of Cipro.   DISCHARGE DIAGNOSES:  1. Urinary tract infection.  2. Hypertensive urgency.  3. Panic attacks, likely causing number two.  4. Obesity.  5. Questionable obstructive sleep apnea, the patient was planned for a      sleep study.   DISCHARGE MEDICATIONS:  1. Metoprolol 50 mg p.o. b.i.d.  2. Norvasc 5 mg p.o. daily.  3. Lisinopril 10 mg p.o. daily.  4. Cipro 500 mg p.o. b.i.d. for three days.  5. Ativan 2 mg p.o. b.i.d. p.r.n., to dispense 20.   DISCHARGE PLAN:  The importance of follow-up with the primary physician  was stressed to him as well as the wife and he is agreeable to follow up  with his primary physician, though he does not have any as of now to  continue managing all this problem mentioned and encountered during this  hospitalization.      Beckey Rutter, MD  Electronically Signed     EME/MEDQ  D:  03/27/2007  T:  03/28/2007  Job:  [id number]   cc:   Beckey Rutter, MD

## 2011-01-14 NOTE — Procedures (Signed)
NAME:  Darren [name], Darren [name] NO.:  [id number]   MEDICAL RECORD NO.:  [id number]          PATIENT TYPE:  OUT   LOCATION:  SLEEP CENTER                 FACILITY:  [facility]   PHYSICIAN:  [name], MD      DATE OF BIRTH:  26-Feb-1966   DATE OF STUDY:  04/28/2007                            NOCTURNAL POLYSOMNOGRAM   REFERRING PHYSICIAN:   INDICATION FOR STUDY:  The patient is a 45 year old with panic  disorders, loud snoring, choking episodes and frequent arousals.   EPWORTH SLEEPINESS SCORE:  11.   Weight is 249 pounds, height 5 feet 7 inches. Neck size 17 inches.   This baseline polysomnogram was recorded with a sleep technologist in  attendance.  EEG, EOG, EMG, EKG and respiratory parameters and oximetry  were recorded. Sleep stages, respiratory data and arousals were scored  according to criteria laid out by the American Academy of Sleep  Medicine.   MEDICATIONS:  At home  1. Cymbalta.  2. Amlodipine.  3. Benazepril.  4. Lorazepam.  5. Septra.   SLEEP ARCHITECTURE:  Lights out was at 10:13 p.m. Sleep onset was at  11:27 p.m. with a sleep latency of 73 minutes. Sleep period time was  281.5 minutes with a total sleep time of 181 minutes leading to a sleep  maintenance efficiency of 64%. Awake during sleep was 103.5 minutes.  Only 2 minutes of REM sleep were recorded with a REM latency of 187  minutes. Sleep stages as a percentage of total sleep time was 27% stage  1 sleep, 72% stage 2 sleep and 1% of REM sleep.   Arousal data, there were a total of 91 arousals of which 69 were  nonspecific, 16 were associated with respiratory events and 6 PLMs.  The  arousal index was 30.2/hour.   Body position data, he spent 39 minutes of sleep in the supine position.   RESPIRATORY DATA:  There were a total of 5 obstructive apneas, 82  hypopneas, 0 central apneas and 0 mixed apneas leading to a total AHI of  28.8/hour. During 2 minutes of REM sleep 2 hypopneas were seen.  During  39 minutes of supine sleep 4 obstructive apneas and 25 hypopneas were  seen leading to a supine AHI of 44.6/hour.   OXYGEN DATA:  The lowest 02 saturation during sleep was 80% during stage  2 sleep with a respiratory event. The mean saturation during sleep was  90%. He spent 116.3 minutes with a saturation below 90%.   CARDIAC DATA:  Occasional PVCs were noted. The low heart rate was 48  beats per minute during non REM and 53 beats per minute during REM. The  high heart rate was 77 beats per minute during non REM and 69 beats per  minute during REM sleep.   MOVEMENT-PARASOMNIA:  He had a total of 16 limb movements of which 6  were associated with arousal leading to a limb-movement index of  5.3/hour and a limb-movement arousal index of 2/hour.   DISCUSSION:  Prolonged sleep-onset latency, severe sleep fragmentation,  frequent arousals, mostly nonspecific but quite a few associated with  respiratory events also, moderate-to-severe respiratory  disturbance with  apneas and hypopneas causing oxygen desaturations. This was worse in the  supine position   IMPRESSIONS-RECOMMENDATIONS:  1. Moderate-to-severe obstructive sleep apnea, worse in supine      position.  2. Insomnia of sleep onset and sleep maintenance with frequent      nonspecific arousal. This may be related to anxiety.  3. REM suppression which may be related to medications.   Recommendations  1. The treatment for this degree of sleep apnea would be weight loss      and CPAP therapy. Given his poor sleep efficiency in the lab I      might consider auto CPAP titration at home.  2. Maintain lean body weight.  3. Tools of sleep hygiene should be discussed.  4. Treatment of anxiety disorder.      [name], MD  Electronically Signed     RVA/MEDQ  D:  05/07/2007 12:58:02  T:  05/07/2007 16:01:44  Job:  [id number]   cc:   Corwin Levins, MD  [address]  [place]  [place] [address]

## 2011-01-14 NOTE — Assessment & Plan Note (Signed)
[facility]                             PULMONARY OFFICE NOTE   NAME:AFRICANOOrlan, [name]                        MRN:          [id number]  DATE:04/06/2007                            DOB:          October 17, 1965    HISTORY OF PRESENT ILLNESS:  Darren [name] is a 45 year old gentleman who  has moved down from [place] to [place] and presents for an  evaluation of obstructive sleep apnea as a cause of hypertension.  He  was recently hospitalized for hypertensive crisis requiring labetalol  and nitro drip.  This was accidentally detected at the urologist's  office.  During his hospital admission, loud snoring was noted.  It was  probably due to his obese body habitus that he may have some element of  obstructive sleep apnea.   He reports the sleepiness to be a 13/24 and describes sleepiness in  various situations such as sitting and reading, watching TV, or as a  passenger in a car.  He does admit to taking 2-3 naps in a day.  Waking  up not refreshed.  His usual bedtime is around 11 p.m. to midnight.  He  generally sleeps on his side.  He used to take a couple of hours  sometimes to fall asleep, but now that he takes Ativan at night, his  sleep latency is considerably decreased.  He wakes up 4-5 times  occasionally due to back pain but mostly for bathroom visits.  He denies  any postvoid sleep latency.  He finally gets out of bed at around 7 a.m.  feeling tired, sleepy, with an occasional headache.  He has gained about  35 pounds since he stopped working three years ago.   PAST MEDICAL HISTORY:  1. Uncontrolled hypertension with recent hypertensive crisis.  2. Chronic back pain due to lumbosacral radiculopathy with      ilioinguinal/genitofemoral peripheral nerve entrapment.  3. Testicular mass for which he was seeing the urologist.  4. Recent UTI.  5. Panic attacks and anxiety disorder.   PAST SURGICAL HISTORY:  Appendectomy in 2003.   ALLERGIES:   None known.   CURRENT MEDICATIONS:  1. Benazepril 40 mg daily.  2. Amlodipine 10 mg p.o. daily.  3. Cymbalta 60 mg daily.  4. Septra 800/160 1 tablet b.i.d.  5. Lorazepam 2 mg p.o. b.i.d. p.r.n. anxiety.  6. Hydrocodone/APAP 7.5/750 1 tablet q.3-4h. p.r.n.  7. Oxycodone/APAP 5/325 mg 1 tablet q.4-6h. p.r.n.   SOCIAL HISTORY:  He is a lifetime never-smoker.  He is married and lives  with his wife.  He is a social drinker.  He is disabled ever since he  developed chronic back pain while lifting a load during work.   FAMILY HISTORY:  Heart disease in his father.  Clotting disorders in his  sister.  Cancer in a paternal grandfather, unknown type.   REVIEW OF SYSTEMS:  His wife reports loud snoring, sleepiness during the  day.  Witnessed pause during sleep.  Occasional indigestion and teeth  grinding in sleep.  His legs do not bother him in sleep.  There is no  history of cataplexy, sleep paralysis, or parasomnias.   PHYSICAL EXAMINATION:  Weight 252 pounds.  Blood pressure 164/112, heart  rate 103 per minute, oxygen saturation 97% on room air.  HEENT:  Narrow pharyngeal space.  NECK:  Supple.  No JVD.  No lymphadenopathy.  Neck circumference was 18  inches.  CVS:  S1 and S2.  Normal chest.  Clear to auscultation.  ABDOMEN:  Soft, nontender.  NEUROLOGIC:  Nonfocal.  EXTREMITIES:  No edema.   Lab data from his recent admission showed a BUN and creatinine of 11 and  1.4.  Bicarbonate of 30.  Hemoglobin 15.2.  Cardiac enzymes were  negative.   Chest x-ray showed minimal cardiac enlargement.  No pulmonary  infiltrates.   IMPRESSION/PLAN:  Given his excessive daytime somnolence, loud snoring,  obese body habitus, pharyngeal exam, and witnessed apneas, obstructive  sleep apnea seems very likely, and an overnight polysomnogram will be  scheduled.  The pathophysiology of sleep apnea, its cardiovascular  consequences, and modes of treatment, including CPAP, were discussed  with the  patient in great detail.  He evidenced understanding.   If the AHI is significant, CPAP will be titrated during the same night.  Hopefully with treatment of his sleep apnea, we will be able to better  control his hypertension and chronic pain.  These issues were also  discussed with the patient.     [name], MD  Electronically Signed    RVA/MedQ  DD: 04/06/2007  DT: 04/06/2007  Job #: [id number]   cc:   Corwin Levins, MD

## 2011-01-14 NOTE — H&P (Signed)
NAME:  BYNUM, MCCULLARS NO.:  [id number]   MEDICAL RECORD NO.:  [id number]          PATIENT TYPE:  EMS   LOCATION:  ED                           FACILITY:  [facility]   PHYSICIAN:  Hind I Eda Paschal, MD      DATE OF BIRTH:  [birth year]   DATE OF ADMISSION:  03/25/2007  DATE OF DISCHARGE:                              HISTORY & PHYSICAL   PRIMARY CARE PHYSICIAN:  Unassigned.   UROLOGIST:  Excell Seltzer. Annabell Howells, M.D.   HISTORY OF PRESENT ILLNESS:  A 45 year old obese male with history of  panic attacks, history of chronic back pain, who was transferred from  the urologist's office, Dr. Annabell Howells, found to have extremely hypertensive,  and was sent to the ED for further evaluation.  The patient did see Dr.  Annabell Howells today for evaluation of what was felt by the patient as a right  testicular mass according to the patient, seen by Dr. Annabell Howells, where he  thought this could be due to folliculitis, and he was given p.o.  antibiotics for possible urinary tract infection.  The patient was seen  at Dr. Belva Crome office, where found systolic blood pressure of 260 and  then further evaluated by the emergency room.  The patient was seen in  the emergency room.  Denies any blurring of vision.  Denies any  headache.  Denies any shortness of breath or chest pain.  Denies any  numbness or weakness of his extremities.  Denies any nausea or vomiting  or abdominal pain.  The patient was completely asymptomatic when he was  seen, but systolic blood pressure was 250/142.  The patient denies any  previous history of hypertension.   PAST MEDICAL HISTORY:  1. History of back pain, which is chronic.  2. Panic attacks.   ALLERGIES:  No known drug allergies.   MEDICATIONS:  1. Vicodin as needed.  2. Percocet as needed.  3. Depo-Medrol 30% and lidocaine 70% injection at the backside every 6      months.   PAST SURGICAL HISTORY:  Appendectomy.   FAMILY HISTORY:  Positive for history of hypertension.  Mother has  a  history of hypertension.  Father history of hypertension, stroke and  heart attack.  Sister has a history of hypertension and  hypercholesterolemia.   SOCIAL HISTORY:  Lives with his wife.  Patient on disability for 5  years.  Used to work as a Engineer, production.  No history of smoking, alcohol or IV  drug use.   PHYSICAL EXAMINATION:  GENERAL:  The patient is lying comfortably on  bed.  No respiratory distress or shortness of breath.  VITAL SIGNS:  Temperature 98.1, blood pressure 238/164, pulse rate 106,  saturating 99% on room air.  HEENT:  Normocephalic, atraumatic.  Pupils equal, reactive to light and  accommodation.  There is no papal edema.  NECK:  No JVD.  No thyromegaly.  No lymphadenopathy.  HEART:  S1 and S2.  Loud second heart sound.  No added murmurs.  LUNGS:  Normal work of breathing.  Equal air entry.  ABDOMEN:  Soft, nontender.  Bowel  sound positive, and there is no  evidence of abdominal bruit.  GENITALIA:  Right testicular lump 1.1 cm, it looks like on the out  surface of the skin.  There is no discharge, no penile discharge, and no  inguinal lymphadenopathy.  EXTREMITIES:  Peripheral pulses intact, and there is no abnormal rashes.  CNS:  Alert and oriented x3 with no focal neurological finding.   LABORATORY DATA:  White blood cells 7.8, hemoglobin 15.2, hematocrit  44.2, platelet 223.  Sodium 140, potassium 3.8, chloride 104, carbon  dioxide 30, glucose 105, BUN 11 and creatinine 1.41.  Calcium 9.6, total  protein 7, albumin 4.1, AST 22, ALT 27.  Cardiac enzymes x1 negative.  Urinalysis that resulted protein of 300, white blood cells of 3-6.  Chest x-ray:  Minimal cardiac enlargement.  EKG:  Normal sinus rhythm  with nonspecific ST and T wave abnormalities.   ASSESSMENT AND PLAN:  1. Hypertensive urgency:  We will start the patient on labetalol drip.      Aim is keep the systolic blood pressure above 161 and less than      200.  We will start the patient on labetalol  100 mg p.o. b.i.d.,      and lisinopril 5 mg p.o. b.i.d. Further titration as the patient in      hospital.  At this time, there is no evidence of pulmonary edema or      metabolic cerebral encephalopathy.  We will get 2-D echo.  If blood      pressure not responsive to the above medical treatment, we will      consider ultrasound of the kidney to evaluate for renal artery      stenosis.  2. Positive urinary tract infection, as per patient's doctor, Dr.      Annabell Howells, prescribed some antibiotics for positive urinary tract      infection.  We will get urinalysis and culture, and start the      patient on Cipro p.o.  3. Obesity, weight loss.  4. Chronic back pain, to continue his home medication.  5. Deep venous thrombosis and gastrointestinal prophylaxis.      Hind Bosie Helper, MD  Electronically Signed     HIE/MEDQ  D:  03/25/2007  T:  03/26/2007  Job:  [id number]

## 2011-01-15 DIAGNOSIS — Z0279 Encounter for issue of other medical certificate: Secondary | ICD-10-CM

## 2011-02-05 ENCOUNTER — Other Ambulatory Visit: Payer: Self-pay | Admitting: Internal Medicine

## 2011-02-13 ENCOUNTER — Other Ambulatory Visit: Payer: Self-pay

## 2011-02-13 MED ORDER — DULOXETINE HCL 60 MG PO CPEP: 60.0000 mg | ORAL_CAPSULE | Freq: Every day | ORAL | Status: DC

## 2011-03-11 ENCOUNTER — Telehealth: Payer: Self-pay

## 2011-03-11 NOTE — Telephone Encounter (Signed)
Ok for letter to bank to the effect;  Pt last seen (date).  Pt has been determined to be completely disabled in [place] state prior to moving to [place], and is receiving Soc Sec Disability payments.  Given his last visit and review of his record, I agree that this patient is completely disabled permanently, but is able to manage his financial affairs.

## 2011-03-11 NOTE — Telephone Encounter (Signed)
Is the patient currently getting Soc Sec Disability payments?

## 2011-03-11 NOTE — Telephone Encounter (Signed)
The patient is receiving payments.

## 2011-03-11 NOTE — Telephone Encounter (Signed)
Patients wife is requesting a letter stating the patient is permanently disabled, signed and dated by MD. The patient and his wife are refinancing their home and the bank is requesting a letter signed by pts. MD. Donnella Sham he is permanently disabled. The Social Security award letter from 2006 was not enough for the bank, they also need one from his MD.

## 2011-03-12 NOTE — Telephone Encounter (Signed)
Completed letter, put on MD's desk to sign.

## 2011-03-12 NOTE — Telephone Encounter (Signed)
Called the patient informed wife letter is completed to come by the office to pickup.

## 2011-04-22 ENCOUNTER — Other Ambulatory Visit: Payer: Self-pay | Admitting: Internal Medicine

## 2011-04-22 ENCOUNTER — Other Ambulatory Visit (INDEPENDENT_AMBULATORY_CARE_PROVIDER_SITE_OTHER): Payer: No Typology Code available for payment source

## 2011-04-22 ENCOUNTER — Ambulatory Visit (INDEPENDENT_AMBULATORY_CARE_PROVIDER_SITE_OTHER): Payer: No Typology Code available for payment source | Admitting: Internal Medicine

## 2011-04-22 ENCOUNTER — Encounter: Payer: Self-pay | Admitting: Internal Medicine

## 2011-04-22 VITALS — BP 126/88 | HR 68 | Temp 99.4°F | Ht 67.0 in | Wt 279.1 lb

## 2011-04-22 DIAGNOSIS — M25519 Pain in unspecified shoulder: Secondary | ICD-10-CM

## 2011-04-22 DIAGNOSIS — Z0001 Encounter for general adult medical examination with abnormal findings: Secondary | ICD-10-CM | POA: Insufficient documentation

## 2011-04-22 DIAGNOSIS — M25521 Pain in right elbow: Secondary | ICD-10-CM

## 2011-04-22 DIAGNOSIS — M545 Low back pain, unspecified: Secondary | ICD-10-CM

## 2011-04-22 DIAGNOSIS — M25511 Pain in right shoulder: Secondary | ICD-10-CM | POA: Insufficient documentation

## 2011-04-22 DIAGNOSIS — M25522 Pain in left elbow: Secondary | ICD-10-CM

## 2011-04-22 DIAGNOSIS — M25529 Pain in unspecified elbow: Secondary | ICD-10-CM

## 2011-04-22 DIAGNOSIS — Z0000 Encounter for general adult medical examination without abnormal findings: Secondary | ICD-10-CM

## 2011-04-22 DIAGNOSIS — I10 Essential (primary) hypertension: Secondary | ICD-10-CM

## 2011-04-22 LAB — HEPATIC FUNCTION PANEL
AST: 20 U/L (ref 0–37)
Alkaline Phosphatase: 37 U/L — ABNORMAL LOW (ref 39–117)
Bilirubin, Direct: 0.1 mg/dL (ref 0.0–0.3)
Total Bilirubin: 0.5 mg/dL (ref 0.3–1.2)

## 2011-04-22 LAB — CBC WITH DIFFERENTIAL/PLATELET
Basophils Absolute: 0 10*3/uL (ref 0.0–0.1)
HCT: 44.3 % (ref 39.0–52.0)
Hemoglobin: 14.7 g/dL (ref 13.0–17.0)
Lymphs Abs: 2.1 10*3/uL (ref 0.7–4.0)
MCHC: 33.3 g/dL (ref 30.0–36.0)
MCV: 80.2 fl (ref 78.0–100.0)
Monocytes Absolute: 0.8 10*3/uL (ref 0.1–1.0)
Monocytes Relative: 8.2 % (ref 3.0–12.0)
Neutro Abs: 6.3 10*3/uL (ref 1.4–7.7)
RDW: 13.7 % (ref 11.5–14.6)

## 2011-04-22 LAB — LIPID PANEL
HDL: 41.7 mg/dL (ref >39.00)
Total CHOL/HDL Ratio: 5
Triglycerides: 268 mg/dL — ABNORMAL HIGH (ref 0.0–149.0)

## 2011-04-22 LAB — PSA: PSA: 0.71 ng/mL (ref 0.10–4.00)

## 2011-04-22 LAB — BASIC METABOLIC PANEL
Calcium: 9.4 mg/dL (ref 8.4–10.5)
GFR: 55.71 mL/min — ABNORMAL LOW (ref >60.00)
Potassium: 4 mEq/L (ref 3.5–5.1)
Sodium: 141 mEq/L (ref 135–145)

## 2011-04-22 LAB — URINALYSIS, ROUTINE W REFLEX MICROSCOPIC
Bilirubin Urine: NEGATIVE
Ketones, ur: NEGATIVE
Specific Gravity, Urine: 1.01 (ref 1.000–1.030)
Urine Glucose: NEGATIVE
Urobilinogen, UA: 0.2 (ref 0.0–1.0)

## 2011-04-22 MED ORDER — CARVEDILOL 6.25 MG PO TABS: 6.2500 mg | ORAL_TABLET | Freq: Two times a day (BID) | ORAL | Status: DC

## 2011-04-22 MED ORDER — BENAZEPRIL HCL 40 MG PO TABS: 40.0000 mg | ORAL_TABLET | Freq: Every day | ORAL | Status: DC

## 2011-04-22 NOTE — Progress Notes (Signed)
Subjective:    Patient ID: Darren Black, male    DOB: [birth year], 45 y.o.   MRN: [id number]  HPI Here for wellness and f/u;  Overall doing ok;  Pt denies CP, worsening SOB, DOE, wheezing, orthopnea, PND, worsening LE edema, palpitations, dizziness or syncope.  Pt denies neurological change such as new Headache, facial or extremity weakness.  Pt denies polydipsia, polyuria, or low sugar symptoms. Pt states overall good compliance with treatment and medications, good tolerability, and trying to follow lower cholesterol diet.  Pt denies worsening depressive symptoms, suicidal ideation or panic. No fever, wt loss, night sweats, loss of appetite, or other constitutional symptoms.  Pt states good ability with ADL's, low fall risk, home safety reviewed and adequate, no significant changes in hearing or vision, and occasionally active with exercise. Bystolic too epensive - needs to change.  Also with right shoudler pain, and decr ROM for 1 -2 mo, moderate, also with at same time pain to bilat epicondylar pain and swelling. Past Medical History  Diagnosis Date  . ALLERGIC RHINITIS 07/13/2007  . ANXIETY 05/28/2007  . BACK PAIN, CHRONIC, HX OF 05/28/2007  . DEPRESSION 05/28/2007  . DIZZINESS 07/13/2007  . FATIGUE 07/13/2009  . HAY FEVER 05/28/2007  . HSV 01/11/2008  . HYPERLIPIDEMIA 10/09/2009  . HYPERTENSION 05/28/2007  . HYPOGONADISM 04/20/2009  . HYPOPITUITARISM 05/15/2009  . LIBIDO, DECREASED 04/11/2009  . ORAL ULCER 01/11/2008  . PERIPHERAL EDEMA 02/08/2009  . SKIN LESION 04/12/2008  . SLEEP APNEA, OBSTRUCTIVE 05/29/2007  . URINARY TRACT INFECTION, HX OF 05/28/2007   No past surgical history on file.  reports that he has never smoked. He does not have any smokeless tobacco history on file. He reports that he drinks alcohol. He reports that he does not use illicit drugs. family history includes Cancer in his father; Coronary artery disease in his father; Diabetes in his other; Hyperlipidemia in his other;  Hypertension in his other; and Stroke in his other. No Known Allergies Current Outpatient Prescriptions on File Prior to Visit  Medication Sig Dispense Refill  . BYSTOLIC 10 MG tablet TAKE ONE TABLET BY MOUTH EVERY DAY  90 each  3  . DULoxetine (CYMBALTA) 60 MG capsule Take 1 capsule (60 mg total) by mouth daily.  90 capsule  2  . pravastatin (PRAVACHOL) 40 MG tablet Take 1 tablet (40 mg total) by mouth daily.  90 tablet  2   Review of Systems Review of Systems  Constitutional: Negative for diaphoresis, activity change, appetite change and unexpected weight change.  HENT: Negative for hearing loss, ear pain, facial swelling, mouth sores and neck stiffness.   Eyes: Negative for pain, redness and visual disturbance.  Respiratory: Negative for shortness of breath and wheezing.   Cardiovascular: Negative for chest pain and palpitations.  Gastrointestinal: Negative for diarrhea, blood in stool, abdominal distention and rectal pain.  Genitourinary: Negative for hematuria, flank pain and decreased urine volume.  Musculoskeletal: Negative for myalgias and joint swelling.  Skin: Negative for color change and wound.  Neurological: Negative for syncope and numbness.  Hematological: Negative for adenopathy.  Psychiatric/Behavioral: Negative for hallucinations, self-injury, decreased concentration and agitation.      Objective:   Physical Exam BP 126/88  Pulse 68  Temp(Src) 99.4 F (37.4 C) (Oral)  Ht 5\' 7"  (1.702 m)  Wt 279 lb 1.9 oz (126.608 kg)  BMI 43.72 kg/m2  SpO2 96% Physical Exam  VS noted Constitutional: Pt is oriented to person, place, and time. Appears well-developed and well-nourished.  HENT:  Head: Normocephalic and atraumatic.  Right Ear: External ear normal.  Left Ear: External ear normal.  Nose: Nose normal.  Mouth/Throat: Oropharynx is clear and moist.  Eyes: Conjunctivae and EOM are normal. Pupils are equal, round, and reactive to light.  Neck: Normal range of motion.  Neck supple. No JVD present. No tracheal deviation present.  Cardiovascular: Normal rate, regular rhythm, normal heart sounds and intact distal pulses.   Pulmonary/Chest: Effort normal and breath sounds normal.  Abdominal: Soft. Bowel sounds are normal. There is no tenderness.  Musculoskeletal: Normal range of motion. Exhibits no edema.  Lymphadenopathy:  Has no cervical adenopathy.  Neurological: Pt is alert and oriented to person, place, and time. Pt has normal reflexes. No cranial nerve deficit. Motor/dtr intact Skin: Skin is warm and dry. No rash noted.  Psychiatric:  Has  normal mood and affect. Behavior is normal.  1+ nervous Right shoudler with decreased ROM and tender rotater cuff, as well as bilat lateral epicondylar areas      Assessment & Plan:

## 2011-04-22 NOTE — Assessment & Plan Note (Signed)
bystolic too expensive - to change to coreg bid

## 2011-04-22 NOTE — Assessment & Plan Note (Signed)

## 2011-04-22 NOTE — Assessment & Plan Note (Signed)
?   rotater cuff tear - for ortho referral

## 2011-04-22 NOTE — Patient Instructions (Addendum)
Stop the bystolic when the current bottle runs out Start the generic for coreg instead of the bystolic Continue all other medications as before Please go to LAB in the Basement for the blood and/or urine tests to be done today Please call the phone number [phone] (the PhoneTree System) for results of testing in 2-3 days;  When calling, simply dial the number, and when prompted enter the MRN number above (the Medical Record Number) and the # key, then the message should start. You will be contacted regarding the referral for: orthopedic for the shoulder and arms and lower back Please return in 6 months, or sooner if needed Please check your blood pressure on a regular basis at home;  Your goal is to be most often less than 140/90

## 2011-06-16 LAB — BASIC METABOLIC PANEL
GFR calc Af Amer: >60
GFR calc non Af Amer: 50 — ABNORMAL LOW
Glucose, Bld: 88
Potassium: 3.3 — ABNORMAL LOW
Sodium: 138

## 2011-06-16 LAB — CARDIAC PANEL(CRET KIN+CKTOT+MB+TROPI)
Relative Index: INVALID
Relative Index: INVALID
Total CK: 51
Total CK: 57
Total CK: 73

## 2011-06-16 LAB — DIFFERENTIAL
Basophils Absolute: 0
Basophils Relative: 0
Monocytes Relative: 5
Neutro Abs: 9 — ABNORMAL HIGH
Neutrophils Relative %: 84 — ABNORMAL HIGH

## 2011-06-16 LAB — CBC
HCT: 44.5
Hemoglobin: 15.2
MCHC: 34.1
MCHC: 34.5
MCV: 77.1 — ABNORMAL LOW
MCV: 77.7 — ABNORMAL LOW
Platelets: 208
RDW: 13.3
RDW: 13.5

## 2011-06-16 LAB — COMPREHENSIVE METABOLIC PANEL
AST: 19
Albumin: 3.7
Alkaline Phosphatase: 68
BUN: 11
Calcium: 8.8
Creatinine, Ser: 1.49
GFR calc Af Amer: >60
Glucose, Bld: 105 — ABNORMAL HIGH
Potassium: 3.8
Total Protein: 7

## 2011-06-16 LAB — TSH: TSH: 2.16

## 2011-06-16 LAB — LIPID PANEL
HDL: 33 — ABNORMAL LOW
LDL Cholesterol: 139 — ABNORMAL HIGH
Total CHOL/HDL Ratio: 6.5
VLDL: 41 — ABNORMAL HIGH

## 2011-06-16 LAB — URINALYSIS, ROUTINE W REFLEX MICROSCOPIC
Bilirubin Urine: NEGATIVE
Glucose, UA: NEGATIVE
Hgb urine dipstick: NEGATIVE
Ketones, ur: NEGATIVE
Protein, ur: >300 — AB

## 2011-06-16 LAB — PHOSPHORUS: Phosphorus: 4.3

## 2011-06-16 LAB — POCT CARDIAC MARKERS
CKMB, poc: 1.2
Myoglobin, poc: 84.7

## 2011-06-16 LAB — URINE MICROSCOPIC-ADD ON

## 2011-08-07 ENCOUNTER — Ambulatory Visit (INDEPENDENT_AMBULATORY_CARE_PROVIDER_SITE_OTHER): Payer: No Typology Code available for payment source | Admitting: Pulmonary Disease

## 2011-08-07 ENCOUNTER — Encounter: Payer: Self-pay | Admitting: Pulmonary Disease

## 2011-08-07 VITALS — BP 138/92 | HR 73 | Temp 98.3°F | Ht 67.0 in | Wt 286.6 lb

## 2011-08-07 DIAGNOSIS — G4733 Obstructive sleep apnea (adult) (pediatric): Secondary | ICD-10-CM

## 2011-08-07 NOTE — Progress Notes (Signed)
  Subjective:    Patient ID: Darren Black, male    DOB: [birth year], 45 y.o.   MRN: [id number]  HPI  45/M , originally from from [place] for FU of severe obstructive sleep apnea .  PSG in 2008 showed AHI 29/h, wt was 252 lbs, CPAP was initiated at 12 cm based on autotitration data, then lowered to 10 cm  His usual bedtime is around 11 p.m. to midnight. He generally sleeps on his side. He used to take a couple of hours sometimes to fall asleep, but now that he takes Ativan at night, his sleep latency is considerably decreased. He wakes up 3 times occasionally due to back pain but mostly for bathroom visits. He denies any postvoid sleep latency. He finally gets out of bed at around 7 a.m. feeling tired, sleepy, with an occasional headache. He has gained about 35 pounds since his last visit in 2008.  Epworth Sleepiness Score 11/24  He has switched from [facility] to [facility] , since it is cheaper for him to obtain supplies.   08/07/2011 Wt 286, Wife has not noted snoring on current settings. Wakes up rested, no leak, mask ok, pressure ok, no daytime somnolence. On 3 meds for BP Takes ativan 3-4 times/ wk for anxiety  Review of Systems Patient denies significant dyspnea,cough, hemoptysis,  chest pain, palpitations, pedal edema, orthopnea, paroxysmal nocturnal dyspnea, lightheadedness, nausea, vomiting, abdominal or  leg pains      Objective:   Physical Exam Gen. Pleasant, obese, in no distress ENT - no lesions, no post nasal drip, class 2 airway Neck: No JVD, no thyromegaly, no carotid bruits Lungs: no use of accessory muscles, no dullness to percussion, decreased without rales or rhonchi  Cardiovascular: Rhythm regular, heart sounds  normal, no murmurs or gallops, no peripheral edema Musculoskeletal: No deformities, no cyanosis or clubbing , no tremors        Assessment & Plan:

## 2011-08-07 NOTE — Assessment & Plan Note (Signed)
PSG 2008 >> AHI 29/h, wt 252 lbs, CPAP initiated at 12 cm based on autotitration, then lowered to 10 cm  Recehck settings with download  - may need higher pressure again due to wt gain since study. Weight loss encouraged, compliance with goal of at least 4-6 hrs every night is the expectation. Advised against medications with sedative side effects Cautioned against driving when sleepy - understanding that sleepiness will vary on a day to day basis Discussed supplies

## 2011-08-07 NOTE — Patient Instructions (Signed)
We will get download data from the machine & adjust settings as needed

## 2011-08-28 ENCOUNTER — Other Ambulatory Visit (INDEPENDENT_AMBULATORY_CARE_PROVIDER_SITE_OTHER): Payer: No Typology Code available for payment source

## 2011-08-28 ENCOUNTER — Encounter: Payer: Self-pay | Admitting: Internal Medicine

## 2011-08-28 ENCOUNTER — Ambulatory Visit (INDEPENDENT_AMBULATORY_CARE_PROVIDER_SITE_OTHER): Payer: No Typology Code available for payment source | Admitting: Internal Medicine

## 2011-08-28 ENCOUNTER — Telehealth: Payer: Self-pay | Admitting: *Deleted

## 2011-08-28 VITALS — BP 122/80 | HR 76 | Temp 98.2°F | Ht 67.0 in | Wt 295.2 lb

## 2011-08-28 DIAGNOSIS — E785 Hyperlipidemia, unspecified: Secondary | ICD-10-CM

## 2011-08-28 DIAGNOSIS — E230 Hypopituitarism: Secondary | ICD-10-CM

## 2011-08-28 DIAGNOSIS — Z23 Encounter for immunization: Secondary | ICD-10-CM

## 2011-08-28 DIAGNOSIS — Z0000 Encounter for general adult medical examination without abnormal findings: Secondary | ICD-10-CM

## 2011-08-28 DIAGNOSIS — I10 Essential (primary) hypertension: Secondary | ICD-10-CM

## 2011-08-28 LAB — BASIC METABOLIC PANEL
Chloride: 100 mEq/L (ref 96–112)
GFR: 71.82 mL/min (ref >60.00)
Potassium: 3.2 mEq/L — ABNORMAL LOW (ref 3.5–5.1)

## 2011-08-28 MED ORDER — POTASSIUM CHLORIDE 10 MEQ PO TBCR: 10.0000 meq | EXTENDED_RELEASE_TABLET | Freq: Every day | ORAL | Status: DC

## 2011-08-28 NOTE — Progress Notes (Signed)
Subjective:    Patient ID: Darren Black, male    DOB: [birth year], 45 y.o.   MRN: [id number]  HPI  Here to f/u; stopped the clomid after a chiropracter rec'd and herbal suppelment.  Today is interested in information about the lap band, has tried [employer] and lower fat/lower chol diet but unalbe to lose wt.  No hx of DM, trying to avoid this.  BP has been well controlled on current meds.  Pt denies chest pain, increased sob or doe, wheezing, orthopnea, PND, increased LE swelling, palpitations, dizziness or syncope.  Pt denies new neurological symptoms such as new headache, or facial or extremity weakness or numbness   Pt denies polydipsia, polyuria,  Pt states overall good compliance with meds, trying to follow lower cholesterol diet, wt overall stable but little exercise however.  Does have sense of ongoing fatigue, but denies signficant hypersomnolence. Past Medical History  Diagnosis Date  . ALLERGIC RHINITIS 07/13/2007  . ANXIETY 05/28/2007  . BACK PAIN, CHRONIC, HX OF 05/28/2007  . DEPRESSION 05/28/2007  . DIZZINESS 07/13/2007  . FATIGUE 07/13/2009  . HAY FEVER 05/28/2007  . HSV 01/11/2008  . HYPERLIPIDEMIA 10/09/2009  . HYPERTENSION 05/28/2007  . HYPOGONADISM 04/20/2009  . HYPOPITUITARISM 05/15/2009  . LIBIDO, DECREASED 04/11/2009  . ORAL ULCER 01/11/2008  . PERIPHERAL EDEMA 02/08/2009  . SKIN LESION 04/12/2008  . SLEEP APNEA, OBSTRUCTIVE 05/29/2007  . URINARY TRACT INFECTION, HX OF 05/28/2007   No past surgical history on file.  reports that he has never smoked. He has never used smokeless tobacco. He reports that he drinks alcohol. He reports that he does not use illicit drugs. family history includes Cancer in his father; Coronary artery disease in his father; Diabetes in his other; Hyperlipidemia in his other; Hypertension in his other; and Stroke in his other. No Known Allergies Current Outpatient Prescriptions on File Prior to Visit  Medication Sig Dispense Refill  . amLODipine  (NORVASC) 10 MG tablet Take 1/2 by mouth daily       . benazepril (LOTENSIN) 40 MG tablet Take 1 tablet (40 mg total) by mouth daily.  90 tablet  3  . carvedilol (COREG) 6.25 MG tablet Take 1 tablet (6.25 mg total) by mouth 2 (two) times daily.  180 tablet  3  . Cholecalciferol (VITAMIN D-3 PO) Take by mouth daily.        . DULoxetine (CYMBALTA) 60 MG capsule Take 1 capsule (60 mg total) by mouth daily.  90 capsule  2  . hydrochlorothiazide 25 MG tablet Take 1/2 by mouth daily       . LORazepam (ATIVAN) 2 MG tablet Take 1/2 by mouth two times a day as needed       . Multiple Vitamin (MULTIVITAMIN) tablet Take 1 tablet by mouth daily.        . Omega 3-6-9 Fatty Acids (OMEGA 3-6-9 COMPLEX PO) Take by mouth daily.        . pravastatin (PRAVACHOL) 40 MG tablet Take 1 tablet (40 mg total) by mouth daily.  90 tablet  2  . clomiPHENE (CLOMID) 50 MG tablet Take 1/2 by mouth daily        Review of Systems Review of Systems  Constitutional: Negative for diaphoresis and unexpected weight change.  HENT: Negative for drooling and tinnitus.   Eyes: Negative for photophobia and visual disturbance.  Respiratory: Negative for choking and stridor.   Gastrointestinal: Negative for vomiting and blood in stool.  Genitourinary: Negative for hematuria and decreased urine  volume.  Musculoskeletal: Negative for gait problem.    Objective:   Physical Exam BP 122/80  Pulse 76  Temp(Src) 98.2 F (36.8 C) (Oral)  Ht 5\' 7"  (1.702 m)  Wt 295 lb 4 oz (133.925 kg)  BMI 46.24 kg/m2  SpO2 95% Physical Exam  VS noted, morbid obese Constitutional: Pt appears well-developed and well-nourished.  HENT: Head: Normocephalic.  Right Ear: External ear normal.  Left Ear: External ear normal.  Eyes: Conjunctivae and EOM are normal. Pupils are equal, round, and reactive to light.  Neck: Normal range of motion. Neck supple.  Cardiovascular: Normal rate and regular rhythm.   Pulmonary/Chest: Effort normal and breath sounds  normal.  Abd:  Soft, NT, non-distended, + BS Neurological: Pt is alert. No cranial nerve deficit.  Skin: Skin is warm. No erythema.  Psychiatric: Pt behavior is normal. Thought content normal.     Assessment & Plan:

## 2011-08-28 NOTE — Assessment & Plan Note (Addendum)
stable overall by hx and exam, most recent data reviewed with pt, and pt to continue medical treatment as before, for lab today, Pt states will re-start clomid if testosterone low

## 2011-08-28 NOTE — Assessment & Plan Note (Signed)
Ok to refer to bariatric surgury for evaluation for wt loss surgury such as lap band

## 2011-08-28 NOTE — Telephone Encounter (Signed)
[name] to call pt for new blood draw please

## 2011-08-28 NOTE — Assessment & Plan Note (Signed)
stable overall by hx and exam, most recent data reviewed with pt, and pt to continue medical treatment as before  BP Readings from Last 3 Encounters:  08/28/11 122/80  08/07/11 138/92  04/22/11 126/88

## 2011-08-28 NOTE — Assessment & Plan Note (Signed)
stable overall by hx and exam, most recent data reviewed with pt, and pt to continue medical treatment as before  Lab Results  Component Value Date   LDLCALC 74 11/06/2009

## 2011-08-28 NOTE — Patient Instructions (Signed)
You had the flu shot today Please go to LAB in the Basement for the blood and/or urine tests to be done today Please call the phone number [phone] (the PhoneTree System) for results of testing in 2-3 days;  When calling, simply dial the number, and when prompted enter the MRN number above (the Medical Record Number) and the # key, then the message should start. Continue all other medications as before If the testosterone is low, you should re-start the clomid You will be contacted regarding the referral for: Bariatric Surgury evaluation at [facility]

## 2011-08-28 NOTE — Telephone Encounter (Signed)
Darren Black downstairs in the lab called and states she received a lab add on sheet for a Hgb A1C that cannot be added on because the previous lab testing pt had done were not drawn in a purple tube. Pt will have to come in and be redrawn for the Hgb A1C. FYI

## 2011-09-01 NOTE — Telephone Encounter (Signed)
Patient informed. 

## 2011-09-05 ENCOUNTER — Other Ambulatory Visit: Payer: No Typology Code available for payment source

## 2011-09-05 DIAGNOSIS — E230 Hypopituitarism: Secondary | ICD-10-CM

## 2011-09-08 LAB — TESTOSTERONE, FREE, TOTAL, SHBG
Sex Hormone Binding: 18 nmol/L (ref 13–71)
Testosterone-% Free: 2.6 % (ref 1.6–2.9)
Testosterone: 258.91 ng/dL (ref 250–890)

## 2011-09-11 ENCOUNTER — Telehealth: Payer: Self-pay

## 2011-09-11 NOTE — Telephone Encounter (Signed)
[name] spouse called regarding A1C that pt was ordered to have?

## 2011-09-11 NOTE — Telephone Encounter (Signed)
Called the patients wife informed to have the patient to come to lab.

## 2011-09-11 NOTE — Telephone Encounter (Signed)
Put order in for lab. And called the patient to inform

## 2011-09-11 NOTE — Telephone Encounter (Signed)
Apparently we tried to do an addon that was not able to be done (sample too old?)  Ok for hgba1c 790.2 - to robin to coordinate

## 2011-09-15 ENCOUNTER — Other Ambulatory Visit (INDEPENDENT_AMBULATORY_CARE_PROVIDER_SITE_OTHER): Payer: No Typology Code available for payment source

## 2011-09-15 DIAGNOSIS — R7309 Other abnormal glucose: Secondary | ICD-10-CM

## 2011-09-15 LAB — HEMOGLOBIN A1C: Hgb A1c MFr Bld: 6.3 % (ref 4.6–6.5)

## 2011-09-18 ENCOUNTER — Ambulatory Visit: Payer: No Typology Code available for payment source | Admitting: Internal Medicine

## 2011-10-08 ENCOUNTER — Other Ambulatory Visit: Payer: Self-pay | Admitting: Internal Medicine

## 2011-10-10 ENCOUNTER — Encounter: Payer: Self-pay | Admitting: Internal Medicine

## 2011-10-10 ENCOUNTER — Ambulatory Visit (INDEPENDENT_AMBULATORY_CARE_PROVIDER_SITE_OTHER): Payer: No Typology Code available for payment source | Admitting: Internal Medicine

## 2011-10-10 DIAGNOSIS — I10 Essential (primary) hypertension: Secondary | ICD-10-CM

## 2011-10-10 NOTE — Patient Instructions (Signed)
Continue all other medications as before Your forms are to be filled out Please follow 1500 calorie diet, low fat/low cholesterol Please exercise by walking at least 30 minutes for 3 times per week Please return in 1 month

## 2011-10-12 ENCOUNTER — Encounter: Payer: Self-pay | Admitting: Internal Medicine

## 2011-10-12 NOTE — Assessment & Plan Note (Signed)
stable overall by hx and exam, most recent data reviewed with pt, and pt to continue medical treatment as before  BP Readings from Last 3 Encounters:  10/10/11 112/84  08/28/11 122/80  08/07/11 138/92

## 2011-10-12 NOTE — Assessment & Plan Note (Signed)
For initial visit today; d/w pt diet and excericse plan consisting of 1500 cal diet, and walking 30 min 3 times per wk; current wt and BMI noted as above adn pt advised to not eat other than three meals daily.

## 2011-10-12 NOTE — Progress Notes (Signed)
Subjective:    Patient ID: Darren Black, male    DOB: 1966/01/22, 46 y.o.   MRN: [id number]  HPI  Here for first visit under med supervised wt management;  To date pt has not tried any specific diet or exercise, seems unable to lose wt on his own, has ongoing HTN he hopes to improve as well as reduce wt by lap band in order to avoid risk of DM, worsening back pain, and cancer.  Pt denies chest pain, increased sob or doe, wheezing, orthopnea, PND, increased LE swelling, palpitations, dizziness or syncope.  Pt denies new neurological symptoms such as new headache, or facial or extremity weakness or numbness   Pt denies polydipsia, polyuria.  Pt denies fever, wt loss, night sweats, loss of appetite, or other constitutional symptoms  Overall good compliance with treatment, and good medicine tolerability. Past Medical History  Diagnosis Date  . ALLERGIC RHINITIS 07/13/2007  . ANXIETY 05/28/2007  . BACK PAIN, CHRONIC, HX OF 05/28/2007  . DEPRESSION 05/28/2007  . DIZZINESS 07/13/2007  . FATIGUE 07/13/2009  . HAY FEVER 05/28/2007  . HSV 01/11/2008  . HYPERLIPIDEMIA 10/09/2009  . HYPERTENSION 05/28/2007  . HYPOGONADISM 04/20/2009  . HYPOPITUITARISM 05/15/2009  . LIBIDO, DECREASED 04/11/2009  . ORAL ULCER 01/11/2008  . PERIPHERAL EDEMA 02/08/2009  . SKIN LESION 04/12/2008  . SLEEP APNEA, OBSTRUCTIVE 05/29/2007  . URINARY TRACT INFECTION, HX OF 05/28/2007   No past surgical history on file.  reports that he has never smoked. He has never used smokeless tobacco. He reports that he drinks alcohol. He reports that he does not use illicit drugs. family history includes Cancer in his father; Coronary artery disease in his father; Diabetes in his other; Hyperlipidemia in his other; Hypertension in his other; and Stroke in his other. No Known Allergies Current Outpatient Prescriptions on File Prior to Visit  Medication Sig Dispense Refill  . amLODipine (NORVASC) 10 MG tablet TAKE ONE-HALF TABLET BY MOUTH EVERY DAY   45 tablet  3  . benazepril (LOTENSIN) 40 MG tablet Take 1 tablet (40 mg total) by mouth daily.  90 tablet  3  . carvedilol (COREG) 6.25 MG tablet Take 1 tablet (6.25 mg total) by mouth 2 (two) times daily.  180 tablet  3  . Cholecalciferol (VITAMIN D-3 PO) Take by mouth daily.        . clomiPHENE (CLOMID) 50 MG tablet Take 1/2 by mouth daily       . DULoxetine (CYMBALTA) 60 MG capsule Take 1 capsule (60 mg total) by mouth daily.  90 capsule  2  . hydrochlorothiazide 25 MG tablet Take 1/2 by mouth daily       . LORazepam (ATIVAN) 2 MG tablet Take 1/2 by mouth two times a day as needed       . Multiple Vitamin (MULTIVITAMIN) tablet Take 1 tablet by mouth daily.        . Omega 3-6-9 Fatty Acids (OMEGA 3-6-9 COMPLEX PO) Take by mouth daily.        . potassium chloride (KLOR-CON 10) 10 MEQ CR tablet Take 1 tablet (10 mEq total) by mouth daily.  90 tablet  3  . pravastatin (PRAVACHOL) 40 MG tablet Take 1 tablet (40 mg total) by mouth daily.  90 tablet  2   Review of Systems All otherwise neg per pt     Objective:   Physical Exam BP 112/84  Pulse 74  Temp(Src) 97.8 F (36.6 C) (Oral)  Ht 5\' 7"  (1.702 m)  Wt 295 lb 8 oz (134.038 kg)  BMI 46.28 kg/m2  SpO2 93% Physical Exam  VS noted Constitutional: Pt appears well-developed and well-nourished.  HENT: Head: Normocephalic.  Right Ear: External ear normal.  Left Ear: External ear normal.  Eyes: Conjunctivae and EOM are normal. Pupils are equal, round, and reactive to light.  Neck: Normal range of motion. Neck supple.  Cardiovascular: Normal rate and regular rhythm.   Pulmonary/Chest: Effort normal and breath sounds normal.  Abd:  Soft, NT, non-distended, + BS Neurological: Pt is alert. No cranial nerve deficit.  Skin: Skin is warm. No erythema.  Psychiatric: Pt behavior is normal. Thought content normal.     Assessment & Plan:

## 2011-10-28 ENCOUNTER — Ambulatory Visit: Payer: No Typology Code available for payment source | Admitting: Internal Medicine

## 2011-11-07 ENCOUNTER — Telehealth: Payer: Self-pay | Admitting: Pulmonary Disease

## 2011-11-07 ENCOUNTER — Ambulatory Visit (INDEPENDENT_AMBULATORY_CARE_PROVIDER_SITE_OTHER): Payer: No Typology Code available for payment source | Admitting: Internal Medicine

## 2011-11-07 VITALS — BP 108/70 | HR 70 | Temp 97.9°F | Ht 67.0 in | Wt 281.1 lb

## 2011-11-07 DIAGNOSIS — S80219A Abrasion, unspecified knee, initial encounter: Secondary | ICD-10-CM

## 2011-11-07 DIAGNOSIS — IMO0002 Reserved for concepts with insufficient information to code with codable children: Secondary | ICD-10-CM

## 2011-11-07 DIAGNOSIS — I10 Essential (primary) hypertension: Secondary | ICD-10-CM

## 2011-11-07 DIAGNOSIS — S46911A Strain of unspecified muscle, fascia and tendon at shoulder and upper arm level, right arm, initial encounter: Secondary | ICD-10-CM

## 2011-11-07 NOTE — Progress Notes (Signed)
Subjective:    Patient ID: Darren Black, male    DOB: Aug 31, 1966, 46 y.o.   MRN: [id number]  HPI  Here to f/u; overall doing well but unfortunately incidentally fell mar 4 to right side with right shoulder strain and abrasion to below the right knee, pain overall improved adn right shoudler ROM improving last few days but still stiff, sore.  Has successfully lost 14 lbs with better diet, 1800 cal, walking 30 min 3 times per wk.  Pt denies chest pain, increased sob or doe, wheezing, orthopnea, PND, increased LE swelling, palpitations, dizziness or syncope.  Pt denies new neurological symptoms such as new headache, or facial or extremity weakness or numbness   Pt denies polydipsia, polyuria.   Past Medical History  Diagnosis Date  . ALLERGIC RHINITIS 07/13/2007  . ANXIETY 05/28/2007  . BACK PAIN, CHRONIC, HX OF 05/28/2007  . DEPRESSION 05/28/2007  . DIZZINESS 07/13/2007  . FATIGUE 07/13/2009  . HAY FEVER 05/28/2007  . HSV 01/11/2008  . HYPERLIPIDEMIA 10/09/2009  . HYPERTENSION 05/28/2007  . HYPOGONADISM 04/20/2009  . HYPOPITUITARISM 05/15/2009  . LIBIDO, DECREASED 04/11/2009  . ORAL ULCER 01/11/2008  . PERIPHERAL EDEMA 02/08/2009  . SKIN LESION 04/12/2008  . SLEEP APNEA, OBSTRUCTIVE 05/29/2007  . URINARY TRACT INFECTION, HX OF 05/28/2007   No past surgical history on file.  reports that he has never smoked. He has never used smokeless tobacco. He reports that he drinks alcohol. He reports that he does not use illicit drugs. family history includes Cancer in his father; Coronary artery disease in his father; Diabetes in his other; Hyperlipidemia in his other; Hypertension in his other; and Stroke in his other. No Known Allergies Current Outpatient Prescriptions on File Prior to Visit  Medication Sig Dispense Refill  . amLODipine (NORVASC) 10 MG tablet TAKE ONE-HALF TABLET BY MOUTH EVERY DAY  45 tablet  3  . benazepril (LOTENSIN) 40 MG tablet Take 1 tablet (40 mg total) by mouth daily.  90 tablet  3    . carvedilol (COREG) 6.25 MG tablet Take 1 tablet (6.25 mg total) by mouth 2 (two) times daily.  180 tablet  3  . Cholecalciferol (VITAMIN D-3 PO) Take by mouth daily.        . clomiPHENE (CLOMID) 50 MG tablet Take 1/2 by mouth daily       . DULoxetine (CYMBALTA) 60 MG capsule Take 1 capsule (60 mg total) by mouth daily.  90 capsule  2  . hydrochlorothiazide 25 MG tablet Take 1/2 by mouth daily       . LORazepam (ATIVAN) 2 MG tablet Take 1/2 by mouth two times a day as needed       . Multiple Vitamin (MULTIVITAMIN) tablet Take 1 tablet by mouth daily.        . Omega 3-6-9 Fatty Acids (OMEGA 3-6-9 COMPLEX PO) Take by mouth daily.        . potassium chloride (KLOR-CON 10) 10 MEQ CR tablet Take 1 tablet (10 mEq total) by mouth daily.  90 tablet  3  . pravastatin (PRAVACHOL) 40 MG tablet Take 1 tablet (40 mg total) by mouth daily.  90 tablet  2   Review of Systems Review of Systems  Constitutional: Negative for diaphoresis and unexpected weight change.  HENT: Negative for drooling and tinnitus.   Eyes: Negative for photophobia and visual disturbance.  Respiratory: Negative for choking and stridor.   Gastrointestinal: Negative for vomiting and blood in stool.  Genitourinary: Negative for hematuria and decreased  urine volume.     Objective:   Physical Exam BP 108/70  Pulse 70  Temp(Src) 97.9 F (36.6 C) (Oral)  Ht 5\' 7"  (1.702 m)  Wt 281 lb 2 oz (127.517 kg)  BMI 44.03 kg/m2  SpO2 94% Physical Exam  VS noted, obese, not ill appearing Constitutional: Pt appears well-developed and well-nourished.  HENT: Head: Normocephalic.  Right Ear: External ear normal.  Left Ear: External ear normal.  Eyes: Conjunctivae and EOM are normal. Pupils are equal, round, and reactive to light.  Neck: Normal range of motion. Neck supple.  Cardiovascular: Normal rate and regular rhythm.   Pulmonary/Chest: Effort normal and breath sounds normal.  Neurological: Pt is alert. No cranial nerve deficit.  Skin:  Skin is warm. No erythema. except for abrasion below right knee with scabbing but no red, tedner, swelling, d/c Psychiatric: Pt behavior is normal. Thought content normal.  Right shoulder with FROM but some discomfort , RUE o/w neurovasc intact    Assessment & Plan:

## 2011-11-07 NOTE — Patient Instructions (Signed)
Continue all other medications as before Your form was filled out today Please check your blood pressure on a regular basis; if becomes less than 100/90, you can start taking 1/2 of the amlodipine 10 mg pill Please return in 3 mo with Lab testing done 3-5 days before

## 2011-11-08 ENCOUNTER — Encounter: Payer: Self-pay | Admitting: Internal Medicine

## 2011-11-08 DIAGNOSIS — S80219A Abrasion, unspecified knee, initial encounter: Secondary | ICD-10-CM | POA: Insufficient documentation

## 2011-11-08 DIAGNOSIS — S46911A Strain of unspecified muscle, fascia and tendon at shoulder and upper arm level, right arm, initial encounter: Secondary | ICD-10-CM | POA: Insufficient documentation

## 2011-11-08 NOTE — Assessment & Plan Note (Signed)
BP borderline low, to cont meds as is for now, asympt, consider decreased amlodipine with any dizziness with BP decr with wt loss

## 2011-11-08 NOTE — Assessment & Plan Note (Signed)
S/p fall, mild, FROM, for tylenol prn,  to f/u any worsening symptoms or concerns, should not have to have PT or further evaluation

## 2011-11-08 NOTE — Assessment & Plan Note (Signed)
For cont'd attention to diet, increased activity, wt loss,  to f/u any worsening symptoms or concerns

## 2011-11-08 NOTE — Assessment & Plan Note (Signed)
Right, mild, no evidence cellulitis, reassured, cont neosporin daily ,  to f/u any worsening symptoms or concerns

## 2011-11-10 ENCOUNTER — Telehealth: Payer: Self-pay | Admitting: Pulmonary Disease

## 2011-11-10 NOTE — Telephone Encounter (Signed)
Results were to be faxed for dr Vassie Loll to review Tobe Sos

## 2011-11-10 NOTE — Telephone Encounter (Signed)
Download 1/30-2/13/13 He has mild leak on his mask but events seem well controlled on cpap 10 cm

## 2011-11-12 ENCOUNTER — Encounter: Payer: Self-pay | Admitting: *Deleted

## 2011-11-12 NOTE — Telephone Encounter (Signed)
ATC pt at home # but states it is a [employer] # and tried his cell and was disconnected. Will send letter to pt advising him to call office back

## 2011-11-17 ENCOUNTER — Telehealth: Payer: Self-pay | Admitting: Pulmonary Disease

## 2011-11-17 ENCOUNTER — Encounter: Payer: Self-pay | Admitting: Pulmonary Disease

## 2011-11-17 NOTE — Telephone Encounter (Signed)
LMOM for pt TCB....  Need to inform him of cpap download results which were---- Download 1/30-2/13/13  He has mild leak on his mask but events seem well controlled on cpap 10 cm

## 2011-11-18 NOTE — Telephone Encounter (Signed)
Spoke with pt's spouse and notified of recs per RA. She verbalized understanding and states nothing further needed.

## 2011-12-08 ENCOUNTER — Encounter: Payer: Self-pay | Admitting: Internal Medicine

## 2011-12-08 ENCOUNTER — Ambulatory Visit (INDEPENDENT_AMBULATORY_CARE_PROVIDER_SITE_OTHER): Payer: No Typology Code available for payment source | Admitting: Internal Medicine

## 2011-12-08 DIAGNOSIS — M25511 Pain in right shoulder: Secondary | ICD-10-CM | POA: Insufficient documentation

## 2011-12-08 DIAGNOSIS — M25519 Pain in unspecified shoulder: Secondary | ICD-10-CM

## 2011-12-08 DIAGNOSIS — I10 Essential (primary) hypertension: Secondary | ICD-10-CM

## 2011-12-08 MED ORDER — PRAVASTATIN SODIUM 40 MG PO TABS: 40.0000 mg | ORAL_TABLET | Freq: Every day | ORAL | Status: DC

## 2011-12-08 MED ORDER — NAPROXEN 500 MG PO TABS: 500.0000 mg | ORAL_TABLET | Freq: Two times a day (BID) | ORAL | Status: DC

## 2011-12-08 NOTE — Assessment & Plan Note (Signed)
stable overall by hx and exam, most recent data reviewed with pt, and pt to continue medical treatment as before  BP Readings from Last 3 Encounters:  12/08/11 110/70  11/07/11 108/70  10/10/11 112/84

## 2011-12-08 NOTE — Patient Instructions (Signed)
Take all new medications as prescribed - the anti-inflammatory You will be contacted regarding the referral for: orthopedic, and MRI right shoulder Continue all other medications as before Your weight loss form was filled out today Your pravastatin was refilled as well Please return in 1 month

## 2011-12-08 NOTE — Assessment & Plan Note (Addendum)
?   Ac joint djd flare vs rot cuff tear - for MRI right shoudler, refer ortho - [facility] per pt request, nsaid prn,  to f/u any worsening symptoms or concerns

## 2011-12-08 NOTE — Assessment & Plan Note (Signed)
Wt now down to 279 for start 295, but has not been excercising due to social factors, stress and right shoulder pain continuing, to increase exercise 30 min 3xwkly,  to f/u any worsening symptoms or concerns in 1 mo, for 1800 cal diet

## 2011-12-08 NOTE — Progress Notes (Signed)
Subjective:    Patient ID: Darren Black, male    DOB: [birth year], 46 y.o.   MRN: [id number]  HPI  Here to f/u wt loss, has lost 2 more lbs in the past month with attention to diet - low carb/1800 cal, low fat/chol diet;  But has not been walking for exercise as right shoulder pain has persisted, in fact worse in that he has a swollen area not improveing to the right ant shoulder and cannot abduct past about 90 degree.  Did have fall approx 1 mo, pain ongoing since that time, now moderate, chronic persistent, nothing makes better, laying on right shoulder makes worse/ Pt denies chest pain, increased sob or doe, wheezing, orthopnea, PND, increased LE swelling, palpitations, dizziness or syncope.  Pt denies new neurological symptoms such as new headache, or facial or extremity weakness or numbness   Pt denies polydipsia, polyuria/. Past Medical History  Diagnosis Date  . ALLERGIC RHINITIS 07/13/2007  . ANXIETY 05/28/2007  . BACK PAIN, CHRONIC, HX OF 05/28/2007  . DEPRESSION 05/28/2007  . DIZZINESS 07/13/2007  . FATIGUE 07/13/2009  . HAY FEVER 05/28/2007  . HSV 01/11/2008  . HYPERLIPIDEMIA 10/09/2009  . HYPERTENSION 05/28/2007  . HYPOGONADISM 04/20/2009  . HYPOPITUITARISM 05/15/2009  . LIBIDO, DECREASED 04/11/2009  . ORAL ULCER 01/11/2008  . PERIPHERAL EDEMA 02/08/2009  . SKIN LESION 04/12/2008  . SLEEP APNEA, OBSTRUCTIVE 05/29/2007  . URINARY TRACT INFECTION, HX OF 05/28/2007   No past surgical history on file.  reports that he has never smoked. He has never used smokeless tobacco. He reports that he drinks alcohol. He reports that he does not use illicit drugs. family history includes Cancer in his father; Coronary artery disease in his father; Diabetes in his other; Hyperlipidemia in his other; Hypertension in his other; and Stroke in his other. No Known Allergies Current Outpatient Prescriptions on File Prior to Visit  Medication Sig Dispense Refill  . amLODipine (NORVASC) 10 MG tablet TAKE ONE-HALF  TABLET BY MOUTH EVERY DAY  45 tablet  3  . benazepril (LOTENSIN) 40 MG tablet Take 1 tablet (40 mg total) by mouth daily.  90 tablet  3  . carvedilol (COREG) 6.25 MG tablet Take 1 tablet (6.25 mg total) by mouth 2 (two) times daily.  180 tablet  3  . Cholecalciferol (VITAMIN D-3 PO) Take by mouth daily.        . clomiPHENE (CLOMID) 50 MG tablet Take 1/2 by mouth daily       . DULoxetine (CYMBALTA) 60 MG capsule Take 1 capsule (60 mg total) by mouth daily.  90 capsule  2  . hydrochlorothiazide 25 MG tablet Take 1/2 by mouth daily       . LORazepam (ATIVAN) 2 MG tablet Take 1/2 by mouth two times a day as needed       . Multiple Vitamin (MULTIVITAMIN) tablet Take 1 tablet by mouth daily.        . Omega 3-6-9 Fatty Acids (OMEGA 3-6-9 COMPLEX PO) Take by mouth daily.        . potassium chloride (KLOR-CON 10) 10 MEQ CR tablet Take 1 tablet (10 mEq total) by mouth daily.  90 tablet  3  . DISCONTD: pravastatin (PRAVACHOL) 40 MG tablet Take 1 tablet (40 mg total) by mouth daily.  90 tablet  2   Review of Systems Review of Systems  Constitutional: Negative for diaphoresis and unexpected weight change.  HENT: Negative for drooling and tinnitus.   Eyes: Negative for photophobia and  visual disturbance.  Respiratory: Negative for choking and stridor.   Gastrointestinal: Negative for vomiting and blood in stool.  Genitourinary: Negative for hematuria and decreased urine volume.  Musculoskeletal: Negative for gait problem.    Objective:   Physical Exam BP 110/70  Pulse 70  Temp(Src) 98.2 F (36.8 C) (Oral)  Ht 5\' 7"  (1.702 m)  Wt 279 lb (126.554 kg)  BMI 43.70 kg/m2  SpO2 92% BP 110/70  Pulse 70  Temp(Src) 98.2 F (36.8 C) (Oral)  Ht 5\' 7"  (1.702 m)  Wt 279 lb (126.554 kg)  BMI 43.70 kg/m2  SpO2 92% Physical Exam  VS noted Constitutional: Pt appears well-developed and well-nourished.  HENT: Head: Normocephalic.  Right Ear: External ear normal.  Left Ear: External ear normal.  Eyes:  Conjunctivae and EOM are normal. Pupils are equal, round, and reactive to light.  Neck: Normal range of motion. Neck supple.  Cardiovascular: Normal rate and regular rhythm.   Pulmonary/Chest: Effort normal and breath sounds normal.  Abd:  Soft, NT, non-distended, + BS Neurological: Pt is alert. No cranial nerve deficit. motor/sens/dtr intact Skin: Skin is warm. No erythema.  Psychiatric: Pt behavior is normal. Thought content normal. 1+ nervous Right shouder with pain to abuct and forward elev to 90 degrees, tender with nondiscrete swelling I think mostly at the [facility] joint    Assessment & Plan:

## 2011-12-11 ENCOUNTER — Other Ambulatory Visit: Payer: Self-pay | Admitting: Orthopedic Surgery

## 2011-12-11 DIAGNOSIS — M25511 Pain in right shoulder: Secondary | ICD-10-CM

## 2011-12-14 ENCOUNTER — Other Ambulatory Visit: Payer: No Typology Code available for payment source

## 2011-12-20 ENCOUNTER — Ambulatory Visit
Admission: RE | Admit: 2011-12-20 | Discharge: 2011-12-20 | Disposition: A | Discharge status: Home / Self Care | Payer: No Typology Code available for payment source | Source: Ambulatory Visit | Attending: Orthopedic Surgery | Admitting: Orthopedic Surgery

## 2011-12-20 DIAGNOSIS — M25511 Pain in right shoulder: Secondary | ICD-10-CM

## 2012-01-07 ENCOUNTER — Encounter: Payer: Self-pay | Admitting: Internal Medicine

## 2012-01-07 ENCOUNTER — Ambulatory Visit (INDEPENDENT_AMBULATORY_CARE_PROVIDER_SITE_OTHER): Payer: No Typology Code available for payment source | Admitting: Internal Medicine

## 2012-01-07 VITALS — BP 112/70 | HR 67 | Temp 99.1°F | Ht 67.0 in | Wt 277.2 lb

## 2012-01-07 DIAGNOSIS — M25511 Pain in right shoulder: Secondary | ICD-10-CM

## 2012-01-07 DIAGNOSIS — M25519 Pain in unspecified shoulder: Secondary | ICD-10-CM

## 2012-01-07 DIAGNOSIS — I10 Essential (primary) hypertension: Secondary | ICD-10-CM

## 2012-01-07 DIAGNOSIS — F329 Major depressive disorder, single episode, unspecified: Secondary | ICD-10-CM

## 2012-01-07 NOTE — Patient Instructions (Signed)
Your form was filled out today Continue all other medications as before No need for other changes today Good Luck with your surgury next week of the right shoulder Please return in 1 month

## 2012-01-17 ENCOUNTER — Encounter: Payer: Self-pay | Admitting: Internal Medicine

## 2012-01-17 NOTE — Assessment & Plan Note (Signed)
Form for obesity/wt loss management filled out, to cont efforts at decreased calories, increased activity and wt loss

## 2012-01-17 NOTE — Assessment & Plan Note (Signed)
stable overall by hx and exam, pain ongoing, limits activity somewhat at this time, for surgury soon, to f/u any worsening symptoms or concerns

## 2012-01-17 NOTE — Assessment & Plan Note (Signed)
stable overall by hx and exam, most recent data reviewed with pt, and pt to continue medical treatment as before BP Readings from Last 3 Encounters:  01/07/12 112/70  12/08/11 110/70  11/07/11 108/70

## 2012-01-17 NOTE — Progress Notes (Signed)
Subjective:    Patient ID: Darren Black, male    DOB: 1966-02-12, 46 y.o.   MRN: [id number]  HPI  Here to f/u; unfortunately suffered the right shoudler injury recently now for surgury next wk, and has complicated his efforts at wt loss, has essentialy been unable to follow through as well as hoped with level of exercise, though has been trying to watch diet.  Plans to do better postop as he can.  Pt denies chest pain, increased sob or doe, wheezing, orthopnea, PND, increased LE swelling, palpitations, dizziness or syncope.  Pt denies new neurological symptoms such as new headache, or facial or extremity weakness or numbness  Pt denies polydipsia, polyuria.   Pt denies fever, wt loss, night sweats, loss of appetite, or other constitutional symptoms  Denies worsening depressive symptoms, suicidal ideation, or panic, though has ongoing anxiety, not increased recently.  Past Medical History  Diagnosis Date  . ALLERGIC RHINITIS 07/13/2007  . ANXIETY 05/28/2007  . BACK PAIN, CHRONIC, HX OF 05/28/2007  . DEPRESSION 05/28/2007  . DIZZINESS 07/13/2007  . FATIGUE 07/13/2009  . HAY FEVER 05/28/2007  . HSV 01/11/2008  . HYPERLIPIDEMIA 10/09/2009  . HYPERTENSION 05/28/2007  . HYPOGONADISM 04/20/2009  . HYPOPITUITARISM 05/15/2009  . LIBIDO, DECREASED 04/11/2009  . ORAL ULCER 01/11/2008  . PERIPHERAL EDEMA 02/08/2009  . SKIN LESION 04/12/2008  . SLEEP APNEA, OBSTRUCTIVE 05/29/2007  . URINARY TRACT INFECTION, HX OF 05/28/2007   No past surgical history on file.  reports that he has never smoked. He has never used smokeless tobacco. He reports that he drinks alcohol. He reports that he does not use illicit drugs. family history includes Cancer in his father; Coronary artery disease in his father; Diabetes in his other; Hyperlipidemia in his other; Hypertension in his other; and Stroke in his other. No Known Allergies Current Outpatient Prescriptions on File Prior to Visit  Medication Sig Dispense Refill  .  amLODipine (NORVASC) 10 MG tablet TAKE ONE-HALF TABLET BY MOUTH EVERY DAY  45 tablet  3  . benazepril (LOTENSIN) 40 MG tablet Take 1 tablet (40 mg total) by mouth daily.  90 tablet  3  . carvedilol (COREG) 6.25 MG tablet Take 1 tablet (6.25 mg total) by mouth 2 (two) times daily.  180 tablet  3  . Cholecalciferol (VITAMIN D-3 PO) Take by mouth daily.        . clomiPHENE (CLOMID) 50 MG tablet Take 1/2 by mouth daily       . DULoxetine (CYMBALTA) 60 MG capsule Take 1 capsule (60 mg total) by mouth daily.  90 capsule  2  . hydrochlorothiazide 25 MG tablet Take 1/2 by mouth daily       . LORazepam (ATIVAN) 2 MG tablet Take 1/2 by mouth two times a day as needed       . Multiple Vitamin (MULTIVITAMIN) tablet Take 1 tablet by mouth daily.        . naproxen (NAPROSYN) 500 MG tablet Take 1 tablet (500 mg total) by mouth 2 (two) times daily with a meal.  60 tablet  2  . Omega 3-6-9 Fatty Acids (OMEGA 3-6-9 COMPLEX PO) Take by mouth daily.        . potassium chloride (KLOR-CON 10) 10 MEQ CR tablet Take 1 tablet (10 mEq total) by mouth daily.  90 tablet  3  . pravastatin (PRAVACHOL) 40 MG tablet Take 1 tablet (40 mg total) by mouth daily.  90 tablet  3   Review of Systems Review  of Systems  Constitutional: Negative for diaphoresis  HENT: Negative for drooling and tinnitus.   Eyes: Negative for photophobia and visual disturbance.  Respiratory: Negative for choking and stridor.   Gastrointestinal: Negative for vomiting and blood in stool.  Genitourinary: Negative for hematuria and decreased urine volume.  Musculoskeletal: Negative for gait problem.  Skin: Negative for color change and wound.  Neurological: Negative for tremors and numbness.  Psychiatric/Behavioral: Negative for decreased concentration. The patient is not hyperactive.       Objective:   Physical Exam BP 112/70  Pulse 67  Temp(Src) 99.1 F (37.3 C) (Oral)  Ht 5\' 7"  (1.702 m)  Wt 277 lb 4 oz (125.76 kg)  BMI 43.42 kg/m2  SpO2  94% Physical Exam  VS noted, not ill appearing Constitutional: Pt appears well-developed and well-nourished.  HENT: Head: Normocephalic.  Right Ear: External ear normal.  Left Ear: External ear normal.  Eyes: Conjunctivae and EOM are normal. Pupils are equal, round, and reactive to light.  Neck: Normal range of motion. Neck supple.  Cardiovascular: Normal rate and regular rhythm.   Pulmonary/Chest: Effort normal and breath sounds normal.  Abd:  Soft, NT, non-distended, + BS Right shoulder not examined Neurological: Pt is alert. Not confused  Skin: Skin is warm. No erythema. No rash Psychiatric: Pt behavior is normal. Thought content normal. 1+ nervous    Assessment & Plan:

## 2012-01-17 NOTE — Assessment & Plan Note (Signed)
stable overall by hx and exam, most recent data reviewed with pt, and pt to continue medical treatment as before Lab Results  Component Value Date   WBC 9.4 04/22/2011   HGB 14.7 04/22/2011   HCT 44.3 04/22/2011   PLT 244.0 04/22/2011   GLUCOSE 165* 08/28/2011   CHOL 189 04/22/2011   TRIG 268.0* 04/22/2011   HDL 41.70 04/22/2011   LDLDIRECT 115.3 04/22/2011   LDLCALC 74 11/06/2009   ALT 19 04/22/2011   AST 20 04/22/2011   NA 138 08/28/2011   K 3.2* 08/28/2011   CL 100 08/28/2011   CREATININE 1.2 08/28/2011   BUN 18 08/28/2011   CO2 30 08/28/2011   TSH 1.45 08/28/2011   PSA 0.71 04/22/2011   HGBA1C 6.3 09/15/2011    

## 2012-01-21 HISTORY — PX: ROTATOR CUFF REPAIR: SHX139

## 2012-01-29 ENCOUNTER — Other Ambulatory Visit: Payer: Self-pay | Admitting: Internal Medicine

## 2012-02-09 ENCOUNTER — Encounter: Payer: Self-pay | Admitting: Internal Medicine

## 2012-02-09 ENCOUNTER — Ambulatory Visit (INDEPENDENT_AMBULATORY_CARE_PROVIDER_SITE_OTHER): Payer: No Typology Code available for payment source | Admitting: Internal Medicine

## 2012-02-09 DIAGNOSIS — F411 Generalized anxiety disorder: Secondary | ICD-10-CM

## 2012-02-09 DIAGNOSIS — I10 Essential (primary) hypertension: Secondary | ICD-10-CM

## 2012-02-09 MED ORDER — LORAZEPAM 2 MG PO TABS: 1.0000 mg | ORAL_TABLET | Freq: Two times a day (BID) | ORAL | Status: DC | PRN

## 2012-02-09 NOTE — Assessment & Plan Note (Signed)
stable overall by hx and exam, most recent data reviewed with pt, and pt to continue medical treatment as before BP Readings from Last 3 Encounters:  02/09/12 118/78  01/07/12 112/70  12/08/11 110/70

## 2012-02-09 NOTE — Assessment & Plan Note (Signed)
To cont wt loss efforts with less calories, more activity,  to f/u any worsening symptoms or concerns

## 2012-02-09 NOTE — Assessment & Plan Note (Signed)
stable overall by hx and exam, most recent data reviewed with pt, and pt to continue medical treatment as before Lab Results  Component Value Date   WBC 9.4 04/22/2011   HGB 14.7 04/22/2011   HCT 44.3 04/22/2011   PLT 244.0 04/22/2011   GLUCOSE 165* 08/28/2011   CHOL 189 04/22/2011   TRIG 268.0* 04/22/2011   HDL 41.70 04/22/2011   LDLDIRECT 115.3 04/22/2011   LDLCALC 74 11/06/2009   ALT 19 04/22/2011   AST 20 04/22/2011   NA 138 08/28/2011   K 3.2* 08/28/2011   CL 100 08/28/2011   CREATININE 1.2 08/28/2011   BUN 18 08/28/2011   CO2 30 08/28/2011   TSH 1.45 08/28/2011   PSA 0.71 04/22/2011   HGBA1C 6.3 09/15/2011   , for med refill today

## 2012-02-09 NOTE — Patient Instructions (Addendum)
Continue all other medications as before, and your lower calorie, low cholesterol diet You are given the lorazepam refill today Please have the pharmacy call with any refills you may need. Please continue your PT as you are doing Please keep your appointments with your specialists as you have planned - orthopedic Please start to be more active as you are able, with walking up to 40 min 3 times weekly

## 2012-02-09 NOTE — Progress Notes (Signed)
Subjective:    Patient ID: Darren Black, male    DOB: 07/08/1966, 46 y.o.   MRN: [id number]  HPI  Here to f/u with wife, Now s/p right shoulder surgury, getting PT, improving, only on ibuprofen now.  Pt denies chest pain, increased sob or doe, wheezing, orthopnea, PND, increased LE swelling, palpitations, dizziness or syncope.  Pt denies new neurological symptoms such as new headache, or facial or extremity weakness or numbness   Pt denies polydipsia, polyuria.  Denies worsening depressive symptoms, suicidal ideation,  though has ongoing anxiety, not increased recently, though needs lorazepam refill as has had several panic attacks.  Is ready to start being more active with walking as pain is better.  Overall has lost 20 lbs since start of wt loss supervision with less calorie and increased activity since feb 2013. Past Medical History  Diagnosis Date  . ALLERGIC RHINITIS 07/13/2007  . ANXIETY 05/28/2007  . BACK PAIN, CHRONIC, HX OF 05/28/2007  . DEPRESSION 05/28/2007  . DIZZINESS 07/13/2007  . FATIGUE 07/13/2009  . HAY FEVER 05/28/2007  . HSV 01/11/2008  . HYPERLIPIDEMIA 10/09/2009  . HYPERTENSION 05/28/2007  . HYPOGONADISM 04/20/2009  . HYPOPITUITARISM 05/15/2009  . LIBIDO, DECREASED 04/11/2009  . ORAL ULCER 01/11/2008  . PERIPHERAL EDEMA 02/08/2009  . SKIN LESION 04/12/2008  . SLEEP APNEA, OBSTRUCTIVE 05/29/2007  . URINARY TRACT INFECTION, HX OF 05/28/2007   No past surgical history on file.  reports that he has never smoked. He has never used smokeless tobacco. He reports that he drinks alcohol. He reports that he does not use illicit drugs. family history includes Cancer in his father; Coronary artery disease in his father; Diabetes in his other; Hyperlipidemia in his other; Hypertension in his other; and Stroke in his other. No Known Allergies Current Outpatient Prescriptions on File Prior to Visit  Medication Sig Dispense Refill  . amLODipine (NORVASC) 10 MG tablet TAKE ONE-HALF TABLET BY  MOUTH EVERY DAY  45 tablet  3  . benazepril (LOTENSIN) 40 MG tablet Take 1 tablet (40 mg total) by mouth daily.  90 tablet  3  . carvedilol (COREG) 6.25 MG tablet Take 1 tablet (6.25 mg total) by mouth 2 (two) times daily.  180 tablet  3  . Cholecalciferol (VITAMIN D-3 PO) Take by mouth daily.        . clomiPHENE (CLOMID) 50 MG tablet Take 1/2 by mouth daily       . DULoxetine (CYMBALTA) 60 MG capsule Take 1 capsule (60 mg total) by mouth daily.  90 capsule  2  . hydrochlorothiazide (HYDRODIURIL) 25 MG tablet TAKE ONE-HALF TABLET BY MOUTH EVERY DAY  90 tablet  3  . Multiple Vitamin (MULTIVITAMIN) tablet Take 1 tablet by mouth daily.        . Omega 3-6-9 Fatty Acids (OMEGA 3-6-9 COMPLEX PO) Take by mouth daily.        . potassium chloride (KLOR-CON 10) 10 MEQ CR tablet Take 1 tablet (10 mEq total) by mouth daily.  90 tablet  3  . pravastatin (PRAVACHOL) 40 MG tablet Take 1 tablet (40 mg total) by mouth daily.  90 tablet  3   Review of Systems Review of Systems  Constitutional: Negative for diaphoresis and unexpected weight change.  HENT: Negative for drooling and tinnitus.   Eyes: Negative for photophobia and visual disturbance.  Respiratory: Negative for choking and stridor.   Gastrointestinal: Negative for vomiting and blood in stool.  Genitourinary: Negative for hematuria and decreased urine volume.  Musculoskeletal:  Negative for gait problem.  Psychiatric/Behavioral: Negative for decreased concentration. The patient is not hyperactive.      Objective:   Physical Exam BP 118/78  Pulse 74  Temp(Src) 98.4 F (36.9 C) (Oral)  Resp 16  Wt 275 lb 8 oz (124.966 kg)  SpO2 96% Physical Exam  VS noted Constitutional: Pt appears well-developed and well-nourished.  HENT: Head: Normocephalic.  Right Ear: External ear normal.  Left Ear: External ear normal.  Eyes: Conjunctivae and EOM are normal. Pupils are equal, round, and reactive to light.  Neck: Normal range of motion. Neck supple.    Cardiovascular: Normal rate and regular rhythm.   Pulmonary/Chest: Effort normal and breath sounds normal.  Neurological: Pt is alert. No cranial nerve deficit.  \Right shoulder forward elev to 90 degrees only, but little pain Skin: Skin is warm. No erythema.  Psychiatric: Pt behavior is normal. Thought content normal. 1+ nervous    Assessment & Plan:

## 2012-03-10 ENCOUNTER — Ambulatory Visit (INDEPENDENT_AMBULATORY_CARE_PROVIDER_SITE_OTHER): Payer: No Typology Code available for payment source | Admitting: Internal Medicine

## 2012-03-10 ENCOUNTER — Encounter: Payer: Self-pay | Admitting: Internal Medicine

## 2012-03-10 VITALS — BP 110/84 | HR 75 | Temp 98.4°F | Ht 67.0 in | Wt 277.5 lb

## 2012-03-10 DIAGNOSIS — F3289 Other specified depressive episodes: Secondary | ICD-10-CM

## 2012-03-10 DIAGNOSIS — F329 Major depressive disorder, single episode, unspecified: Secondary | ICD-10-CM

## 2012-03-10 DIAGNOSIS — Z0000 Encounter for general adult medical examination without abnormal findings: Secondary | ICD-10-CM

## 2012-03-10 DIAGNOSIS — I10 Essential (primary) hypertension: Secondary | ICD-10-CM

## 2012-03-10 MED ORDER — DULOXETINE HCL 60 MG PO CPEP: 60.0000 mg | ORAL_CAPSULE | Freq: Every day | ORAL | Status: DC

## 2012-03-10 NOTE — Patient Instructions (Addendum)
Continue all other medications as before Please return in 1 month, or sooner if needed

## 2012-03-14 ENCOUNTER — Encounter: Payer: Self-pay | Admitting: Internal Medicine

## 2012-03-14 NOTE — Assessment & Plan Note (Signed)
Has lost some wt to date but now stablized more or less at current wt, form filled out, to check again next month but liekly will need gastric bypass to which he is interested

## 2012-03-14 NOTE — Progress Notes (Signed)
Subjective:    Patient ID: Darren Black, male    DOB: Apr 14, 1966, 46 y.o.   MRN: [id number]  HPI   Here to f/u, overall doing ok,  Right shoudler now out of sling, still doing PT for the shoulder and more active overall , feels brighter as far as outlook, Overall good compliance with treatment, and good medicine tolerability.  .Pt denies chest pain, increased sob or doe, wheezing, orthopnea, PND, increased LE swelling, palpitations, dizziness or syncope.  Pt denies new neurological symptoms such as new headache, or facial or extremity weakness or numbness   Pt denies polydipsia, polyuria.  Wt overall up 2 lbs from last visit despite following 1500 cal diet, trying to walk up to 3 times per wk at 30 min,  But overall wt still down from 295.8 to start.   Pt denies fever, wt loss, night sweats, loss of appetite, or other constitutional symptoms  Denies worsening depressive symptoms, suicidal ideation, or panic, though has ongoing anxiety, not increased recently.  Past Medical History  Diagnosis Date  . ALLERGIC RHINITIS 07/13/2007  . ANXIETY 05/28/2007  . BACK PAIN, CHRONIC, HX OF 05/28/2007  . DEPRESSION 05/28/2007  . DIZZINESS 07/13/2007  . FATIGUE 07/13/2009  . HAY FEVER 05/28/2007  . HSV 01/11/2008  . HYPERLIPIDEMIA 10/09/2009  . HYPERTENSION 05/28/2007  . HYPOGONADISM 04/20/2009  . HYPOPITUITARISM 05/15/2009  . LIBIDO, DECREASED 04/11/2009  . ORAL ULCER 01/11/2008  . PERIPHERAL EDEMA 02/08/2009  . SKIN LESION 04/12/2008  . SLEEP APNEA, OBSTRUCTIVE 05/29/2007  . URINARY TRACT INFECTION, HX OF 05/28/2007   No past surgical history on file.  reports that he has never smoked. He has never used smokeless tobacco. He reports that he drinks alcohol. He reports that he does not use illicit drugs. family history includes Cancer in his father; Coronary artery disease in his father; Diabetes in his other; Hyperlipidemia in his other; Hypertension in his other; and Stroke in his other. No Known  Allergies Current Outpatient Prescriptions on File Prior to Visit  Medication Sig Dispense Refill  . amLODipine (NORVASC) 10 MG tablet TAKE ONE-HALF TABLET BY MOUTH EVERY DAY  45 tablet  3  . benazepril (LOTENSIN) 40 MG tablet Take 1 tablet (40 mg total) by mouth daily.  90 tablet  3  . carvedilol (COREG) 6.25 MG tablet Take 1 tablet (6.25 mg total) by mouth 2 (two) times daily.  180 tablet  3  . Cholecalciferol (VITAMIN D-3 PO) Take by mouth daily.        . clomiPHENE (CLOMID) 50 MG tablet Take 1/2 by mouth daily       . hydrochlorothiazide (HYDRODIURIL) 25 MG tablet TAKE ONE-HALF TABLET BY MOUTH EVERY DAY  90 tablet  3  . LORazepam (ATIVAN) 2 MG tablet Take 0.5 tablets (1 mg total) by mouth 2 (two) times daily as needed for anxiety. Take 1/2 by mouth two times a day as needed  60 tablet  2  . Multiple Vitamin (MULTIVITAMIN) tablet Take 1 tablet by mouth daily.        . Omega 3-6-9 Fatty Acids (OMEGA 3-6-9 COMPLEX PO) Take by mouth daily.        . potassium chloride (KLOR-CON 10) 10 MEQ CR tablet Take 1 tablet (10 mEq total) by mouth daily.  90 tablet  3  . pravastatin (PRAVACHOL) 40 MG tablet Take 1 tablet (40 mg total) by mouth daily.  90 tablet  3  . DULoxetine (CYMBALTA) 60 MG capsule Take 1 capsule (60 mg  total) by mouth daily.  90 capsule  3   Review of Systems Review of Systems  Constitutional: Negative for diaphoresis and unexpected weight change.  HENT: Negative for tinnitus.   Eyes: Negative for photophobia and visual disturbance.  Respiratory: Negative for choking and stridor.   Gastrointestinal: Negative for vomiting and blood in stool.  Genitourinary: Negative for hematuria and decreased urine volume.  Musculoskeletal: Negative for gait problem.  Skin: Negative for color change and wound.  Neurological: Negative for tremors and numbness.  Psychiatric/Behavioral: Negative for decreased concentration. The patient is not hyperactive.      Objective:   Physical Exam BP 110/84   Pulse 75  Temp 98.4 F (36.9 C) (Oral)  Ht 5\' 7"  (1.702 m)  Wt 277 lb 8 oz (125.873 kg)  BMI 43.46 kg/m2  SpO2 96% Physical Exam  VS noted, not ill appearing Constitutional: Pt appears well-developed and well-nourished. Lavella Lemons HENT: Head: Normocephalic.  Right Ear: External ear normal.  Left Ear: External ear normal.  Eyes: Conjunctivae and EOM are normal. Pupils are equal, round, and reactive to light.  Neck: Normal range of motion. Neck supple.  Cardiovascular: Normal rate and regular rhythm.   Pulmonary/Chest: Effort normal and breath sounds normal.  Neurological: Pt is alert. Not confused Skin: Skin is warm. No erythema. No rash Psychiatric: Pt behavior is normal. Thought content normal.1+ nervous     Assessment & Plan:

## 2012-03-14 NOTE — Assessment & Plan Note (Signed)
stable overall by hx and exam, most recent data reviewed with pt, and pt to continue medical treatment as before Lab Results  Component Value Date   WBC 9.4 04/22/2011   HGB 14.7 04/22/2011   HCT 44.3 04/22/2011   PLT 244.0 04/22/2011   GLUCOSE 165* 08/28/2011   CHOL 189 04/22/2011   TRIG 268.0* 04/22/2011   HDL 41.70 04/22/2011   LDLDIRECT 115.3 04/22/2011   LDLCALC 74 11/06/2009   ALT 19 04/22/2011   AST 20 04/22/2011   NA 138 08/28/2011   K 3.2* 08/28/2011   CL 100 08/28/2011   CREATININE 1.2 08/28/2011   BUN 18 08/28/2011   CO2 30 08/28/2011   TSH 1.45 08/28/2011   PSA 0.71 04/22/2011   HGBA1C 6.3 09/15/2011

## 2012-03-14 NOTE — Assessment & Plan Note (Signed)
stable overall by hx and exam, most recent data reviewed with pt, and pt to continue medical treatment as before BP Readings from Last 3 Encounters:  03/10/12 110/84  02/09/12 118/78  01/07/12 112/70

## 2012-03-30 ENCOUNTER — Telehealth: Payer: Self-pay | Admitting: Internal Medicine

## 2012-03-30 DIAGNOSIS — Z0279 Encounter for issue of other medical certificate: Secondary | ICD-10-CM

## 2012-03-30 NOTE — Telephone Encounter (Signed)
Not sure to what she refers, beyond the monthly paperwork we have been filling out for his weight evaluations monthly;  I understood he had one more visit for this, and then a final letter of medical necessity for bariatric surgury was to be done  Please let me know if this is not what she refers to

## 2012-03-30 NOTE — Telephone Encounter (Signed)
Forms have been completed and patients wife contacted.

## 2012-03-30 NOTE — Telephone Encounter (Signed)
Caller: Kelly/Spouse; Phone Number: [phone]; Message from caller: Wife states that papers from insurance company were left last week to be completed and she wants to know if they have been completed.

## 2012-04-05 ENCOUNTER — Other Ambulatory Visit (INDEPENDENT_AMBULATORY_CARE_PROVIDER_SITE_OTHER): Payer: No Typology Code available for payment source

## 2012-04-05 DIAGNOSIS — Z0000 Encounter for general adult medical examination without abnormal findings: Secondary | ICD-10-CM

## 2012-04-05 LAB — CBC WITH DIFFERENTIAL/PLATELET
Basophils Relative: 0.5 % (ref 0.0–3.0)
Eosinophils Relative: 2.2 % (ref 0.0–5.0)
Hemoglobin: 14.8 g/dL (ref 13.0–17.0)
MCV: 80.1 fl (ref 78.0–100.0)
Monocytes Absolute: 0.5 10*3/uL (ref 0.1–1.0)
Neutrophils Relative %: 72.4 % (ref 43.0–77.0)
RBC: 5.54 Mil/uL (ref 4.22–5.81)
WBC: 7.2 10*3/uL (ref 4.5–10.5)

## 2012-04-05 LAB — BASIC METABOLIC PANEL
BUN: 18 mg/dL (ref 6–23)
Calcium: 9.4 mg/dL (ref 8.4–10.5)
Chloride: 102 mEq/L (ref 96–112)
Creatinine, Ser: 1.4 mg/dL (ref 0.4–1.5)
GFR: 58.22 mL/min — ABNORMAL LOW (ref >60.00)

## 2012-04-05 LAB — URINALYSIS, ROUTINE W REFLEX MICROSCOPIC
Bilirubin Urine: NEGATIVE
Nitrite: NEGATIVE
pH: 6.5 (ref 5.0–8.0)

## 2012-04-05 LAB — HEPATIC FUNCTION PANEL
ALT: 25 U/L (ref 0–53)
Total Bilirubin: 0.8 mg/dL (ref 0.3–1.2)

## 2012-04-05 LAB — LIPID PANEL
Cholesterol: 184 mg/dL (ref 0–200)
HDL: 42.6 mg/dL (ref >39.00)
LDL Cholesterol: 113 mg/dL — ABNORMAL HIGH (ref 0–99)
Triglycerides: 144 mg/dL (ref 0.0–149.0)
VLDL: 28.8 mg/dL (ref 0.0–40.0)

## 2012-04-05 LAB — PSA: PSA: 0.72 ng/mL (ref 0.10–4.00)

## 2012-04-09 ENCOUNTER — Encounter: Payer: Self-pay | Admitting: Internal Medicine

## 2012-04-09 ENCOUNTER — Ambulatory Visit (INDEPENDENT_AMBULATORY_CARE_PROVIDER_SITE_OTHER): Payer: No Typology Code available for payment source | Admitting: Internal Medicine

## 2012-04-09 VITALS — BP 112/84 | HR 68 | Temp 98.5°F | Ht 67.0 in | Wt 280.5 lb

## 2012-04-09 DIAGNOSIS — I10 Essential (primary) hypertension: Secondary | ICD-10-CM

## 2012-04-09 DIAGNOSIS — F329 Major depressive disorder, single episode, unspecified: Secondary | ICD-10-CM

## 2012-04-09 DIAGNOSIS — Z0000 Encounter for general adult medical examination without abnormal findings: Secondary | ICD-10-CM

## 2012-04-09 NOTE — Patient Instructions (Addendum)
Continue all other medications as before, for now Your form will be filled out and sent Good Luck with your surgury Please return in 6 months, or sooner if needed

## 2012-04-09 NOTE — Assessment & Plan Note (Signed)

## 2012-04-11 ENCOUNTER — Encounter: Payer: Self-pay | Admitting: Internal Medicine

## 2012-04-11 NOTE — Assessment & Plan Note (Signed)
Unable to lose significant wt with diet and exercise in the past 6 months, forms to be filled out, letter of med necessity to be done and sent with recommendation for surgical intervention

## 2012-04-11 NOTE — Progress Notes (Signed)
Subjective:    Patient ID: Darren Black, male    DOB: 11-May-1966, 46 y.o.   MRN: [id number]  HPI  Here to f/u for 7th/7 visits for medically supervised weight loss, unfortunately despite good faith effort at low carb/1500 cal diet with exercise by walking 30-45 min 3 times per wk, he has increased wt by 3 lbs since last visit and remains morbid obese, though has overall lost 15 lbs since the start.  Pt denies chest pain, increased sob or doe, wheezing, orthopnea, PND, increased LE swelling, palpitations, dizziness or syncope.  Pt denies new neurological symptoms such as new headache, or facial or extremity weakness or numbness   Pt denies polydipsia, polyuria.  Pt denies fever, wt loss, night sweats, loss of appetite, or other constitutional symptoms  Right shoulder now back to nearly FROM. Denies worsening depressive symptoms, suicidal ideation, or panic, though has ongoing anxiety. Past Medical History  Diagnosis Date  . ALLERGIC RHINITIS 07/13/2007  . ANXIETY 05/28/2007  . BACK PAIN, CHRONIC, HX OF 05/28/2007  . DEPRESSION 05/28/2007  . DIZZINESS 07/13/2007  . FATIGUE 07/13/2009  . HAY FEVER 05/28/2007  . HSV 01/11/2008  . HYPERLIPIDEMIA 10/09/2009  . HYPERTENSION 05/28/2007  . HYPOGONADISM 04/20/2009  . HYPOPITUITARISM 05/15/2009  . LIBIDO, DECREASED 04/11/2009  . ORAL ULCER 01/11/2008  . PERIPHERAL EDEMA 02/08/2009  . SKIN LESION 04/12/2008  . SLEEP APNEA, OBSTRUCTIVE 05/29/2007  . URINARY TRACT INFECTION, HX OF 05/28/2007   No past surgical history on file.  reports that he has never smoked. He has never used smokeless tobacco. He reports that he drinks alcohol. He reports that he does not use illicit drugs. family history includes Cancer in his father; Coronary artery disease in his father; Diabetes in his other; Hyperlipidemia in his other; Hypertension in his other; and Stroke in his other. No Known Allergies Current Outpatient Prescriptions on File Prior to Visit  Medication Sig Dispense  Refill  . amLODipine (NORVASC) 10 MG tablet TAKE ONE-HALF TABLET BY MOUTH EVERY DAY  45 tablet  3  . benazepril (LOTENSIN) 40 MG tablet Take 1 tablet (40 mg total) by mouth daily.  90 tablet  3  . carvedilol (COREG) 6.25 MG tablet Take 1 tablet (6.25 mg total) by mouth 2 (two) times daily.  180 tablet  3  . Cholecalciferol (VITAMIN D-3 PO) Take by mouth daily.        . clomiPHENE (CLOMID) 50 MG tablet Take 1/2 by mouth daily       . DULoxetine (CYMBALTA) 60 MG capsule Take 1 capsule (60 mg total) by mouth daily.  90 capsule  3  . hydrochlorothiazide (HYDRODIURIL) 25 MG tablet TAKE ONE-HALF TABLET BY MOUTH EVERY DAY  90 tablet  3  . LORazepam (ATIVAN) 2 MG tablet Take 0.5 tablets (1 mg total) by mouth 2 (two) times daily as needed for anxiety. Take 1/2 by mouth two times a day as needed  60 tablet  2  . Multiple Vitamin (MULTIVITAMIN) tablet Take 1 tablet by mouth daily.        . Omega 3-6-9 Fatty Acids (OMEGA 3-6-9 COMPLEX PO) Take by mouth daily.        . potassium chloride (KLOR-CON 10) 10 MEQ CR tablet Take 1 tablet (10 mEq total) by mouth daily.  90 tablet  3  . pravastatin (PRAVACHOL) 40 MG tablet Take 1 tablet (40 mg total) by mouth daily.  90 tablet  3   Review of Systems Review of Systems  Constitutional: Negative  for diaphoresis and unexpected weight change.  HENT: Negative for drooling and tinnitus.   Eyes: Negative for photophobia and visual disturbance.  Respiratory: Negative for choking and stridor.   Gastrointestinal: Negative for vomiting and blood in stool.  Genitourinary: Negative for hematuria and decreased urine volume.  Musculoskeletal: Negative for gait problem.  Skin: Negative for color change and wound.  Neurological: Negative for tremors and numbness.  Psychiatric/Behavioral: Negative for decreased concentration. The patient is not hyperactive.      Objective:   Physical Exam BP 112/84  Pulse 68  Temp 98.5 F (36.9 C) (Oral)  Ht 5\' 7"  (1.702 m)  Wt 280 lb 8  oz (127.234 kg)  BMI 43.93 kg/m2  SpO2 94% Physical Exam  VS noted Constitutional: Pt appears well-developed and well-nourished.  HENT: Head: Normocephalic.  Right Ear: External ear normal.  Left Ear: External ear normal.  Eyes: Conjunctivae and EOM are normal. Pupils are equal, round, and reactive to light.  Neck: Normal range of motion. Neck supple.  Cardiovascular: Normal rate and regular rhythm.   Pulmonary/Chest: Effort normal and breath sounds normal.  Abd:  Soft, NT, non-distended, + BS Neurological: Pt is alert. No cranial nerve deficit.  Skin: Skin is warm. No erythema.  Psychiatric: Pt behavior is normal. Thought content normal.  Not depressed affect    Assessment & Plan:

## 2012-04-11 NOTE — Assessment & Plan Note (Signed)
stable overall by hx and exam, most recent data reviewed with pt, and pt to continue medical treatment as before BP Readings from Last 3 Encounters:  04/09/12 112/84  03/10/12 110/84  02/09/12 118/78

## 2012-04-11 NOTE — Assessment & Plan Note (Signed)
stable overall by hx and exam, most recent data reviewed with pt, and pt to continue medical treatment as before Lab Results  Component Value Date   WBC 7.2 04/05/2012   HGB 14.8 04/05/2012   HCT 44.3 04/05/2012   PLT 230.0 04/05/2012   GLUCOSE 103* 04/05/2012   CHOL 184 04/05/2012   TRIG 144.0 04/05/2012   HDL 42.60 04/05/2012   LDLDIRECT 115.3 04/22/2011   LDLCALC 113* 04/05/2012   ALT 25 04/05/2012   AST 19 04/05/2012   NA 140 04/05/2012   K 4.2 04/05/2012   CL 102 04/05/2012   CREATININE 1.4 04/05/2012   BUN 18 04/05/2012   CO2 30 04/05/2012   TSH 1.12 04/05/2012   PSA 0.72 04/05/2012   HGBA1C 6.3 09/15/2011

## 2012-04-22 ENCOUNTER — Ambulatory Visit (INDEPENDENT_AMBULATORY_CARE_PROVIDER_SITE_OTHER): Payer: Medicare (Managed Care) | Admitting: Surgery

## 2012-04-22 ENCOUNTER — Encounter (INDEPENDENT_AMBULATORY_CARE_PROVIDER_SITE_OTHER): Payer: Self-pay | Admitting: Surgery

## 2012-04-22 DIAGNOSIS — Z6841 Body Mass Index (BMI) 40.0 and over, adult: Secondary | ICD-10-CM

## 2012-04-22 LAB — T4: T4, Total: 7.4 ug/dL (ref 5.0–12.5)

## 2012-04-22 NOTE — Progress Notes (Signed)
Chief Complaint:  Morbid obesity BMI 45   History of Present Illness:  Darren Black is an 46 y.o. male patient of Dr. Oliver Barre.  The patient and his wife came in today and they're obviously well versed with numerous questions regarding lapband. He has had several friends that have had this procedure with good success. We discussed expectations and his needs. He has had obese he has a problem for a number of years and has not been able to satisfactorily reduce his weight. [place]ci good They understand the fundamentals lapband we went over the again. This is what he was to proceeded to a point where we could begin workup.  Past Medical History  Diagnosis Date  . ALLERGIC RHINITIS 07/13/2007  . ANXIETY 05/28/2007  . BACK PAIN, CHRONIC, HX OF 05/28/2007  . DEPRESSION 05/28/2007  . DIZZINESS 07/13/2007  . FATIGUE 07/13/2009  . HAY FEVER 05/28/2007  . HSV 01/11/2008  . HYPERLIPIDEMIA 10/09/2009  . HYPERTENSION 05/28/2007  . HYPOGONADISM 04/20/2009  . HYPOPITUITARISM 05/15/2009  . LIBIDO, DECREASED 04/11/2009  . ORAL ULCER 01/11/2008  . PERIPHERAL EDEMA 02/08/2009  . SKIN LESION 04/12/2008  . SLEEP APNEA, OBSTRUCTIVE 05/29/2007  . URINARY TRACT INFECTION, HX OF 05/28/2007    Past Surgical History  Procedure Date  . Appendectomy 2003  . Shoulder surgery 01/21/2012    retator cuff RT    Current Outpatient Prescriptions  Medication Sig Dispense Refill  . amLODipine (NORVASC) 10 MG tablet TAKE ONE-HALF TABLET BY MOUTH EVERY DAY  45 tablet  3  . Cholecalciferol (VITAMIN D-3 PO) Take by mouth daily.        . DULoxetine (CYMBALTA) 60 MG capsule Take 1 capsule (60 mg total) by mouth daily.  90 capsule  3  . hydrochlorothiazide (HYDRODIURIL) 25 MG tablet TAKE ONE-HALF TABLET BY MOUTH EVERY DAY  90 tablet  3  . KLOR-CON M10 10 MEQ tablet       . LORazepam (ATIVAN) 2 MG tablet Take 0.5 tablets (1 mg total) by mouth 2 (two) times daily as needed for anxiety. Take 1/2 by mouth two times a day as needed  60  tablet  2  . Multiple Vitamin (MULTIVITAMIN) tablet Take 1 tablet by mouth daily.        . Omega 3-6-9 Fatty Acids (OMEGA 3-6-9 COMPLEX PO) Take by mouth daily.        Marland Kitchen oxyCODONE-acetaminophen (PERCOCET/ROXICET) 5-325 MG per tablet       . potassium chloride (KLOR-CON 10) 10 MEQ CR tablet Take 1 tablet (10 mEq total) by mouth daily.  90 tablet  3  . pravastatin (PRAVACHOL) 40 MG tablet Take 1 tablet (40 mg total) by mouth daily.  90 tablet  3  . benazepril (LOTENSIN) 40 MG tablet Take 1 tablet (40 mg total) by mouth daily.  90 tablet  3  . carvedilol (COREG) 6.25 MG tablet Take 1 tablet (6.25 mg total) by mouth 2 (two) times daily.  180 tablet  3  . clomiPHENE (CLOMID) 50 MG tablet Take 1/2 by mouth daily        Review of patient's allergies indicates no known allergies. Family History  Problem Relation Age of Onset  . Coronary artery disease Father   . Cancer Father     anaplastic thyroid cancer  . Stroke Other     1st degree relative  . Diabetes Other     1st degree relative  . Hypertension Other   . Hyperlipidemia Other    Social History:  reports that he has never smoked. He has never used smokeless tobacco. He reports that he drinks alcohol. He reports that he does not use illicit drugs.   REVIEW OF SYSTEMS - PERTINENT POSITIVES ONLY: noncontributory  Physical Exam:   Blood pressure 124/80, pulse 66, temperature 97 F (36.1 C), temperature source Temporal, height 5\' 7"  (1.702 m), weight 287 lb 9.6 oz (130.455 kg). Body mass index is 45.04 kg/(m^2).  Gen:  WDWN WMcommon lateral along NAD  Neurological: Alert and oriented to person, place, and time. Motor and sensory function is grossly intact  Head: Normocephalic and atraumatic.  Eyes: Conjunctivae are normal. Pupils are equal, round, and reactive to light. No scleral icterus.  Neck: Normal range of motion. Neck supple. No tracheal deviation or thyromegaly present.  Cardiovascular:  SR without murmurs or gallops.  No  carotid bruits Respiratory: Effort normal.  No respiratory distress. No chest wall tenderness. Breath sounds normal.  No wheezes, rales or rhonchi.  Abdomen:  Obese, nontender GU: Musculoskeletal: Normal range of motion. Extremities are nontender. No cyanosis, edema or clubbing noted Lymphadenopathy: No cervical, preauricular, postauricular or axillary adenopathy is present Skin: Skin is warm and dry. No rash noted. No diaphoresis. No erythema. No pallor. Pscyh: Normal mood and affect. Behavior is normal. Judgment and thought content normal.   LABORATORY RESULTS: No results found for this or any previous visit (from the past 48 hour(s)).  RADIOLOGY RESULTS: No results found.  Problem List: Patient Active Problem List  Diagnosis  . HSV  . HYPOPITUITARISM  . HYPOGONADISM  . HYPERLIPIDEMIA  . ANXIETY  . DEPRESSION  . SLEEP APNEA, OBSTRUCTIVE  . HYPERTENSION  . HAY FEVER  . ALLERGIC RHINITIS  . ORAL ULCER  . SKIN LESION  . FATIGUE  . PERIPHERAL EDEMA  . LIBIDO, DECREASED  . URINARY TRACT INFECTION, HX OF  . BACK PAIN, CHRONIC, HX OF  . Preventative health care  . Morbid obesity  . Knee abrasion  . Right shoulder pain    Assessment & Plan: Morbid obesity  Lapband    Matt B. Daphine Deutscher, MD, [facility] [phone] beeper [phone]  04/22/2012 11:45 AM

## 2012-04-22 NOTE — Patient Instructions (Addendum)

## 2012-04-27 ENCOUNTER — Encounter (HOSPITAL_COMMUNITY)
Admission: RE | Disposition: A | Discharge status: Home / Self Care | Payer: Self-pay | Source: Ambulatory Visit | Attending: Surgery

## 2012-04-27 ENCOUNTER — Ambulatory Visit (HOSPITAL_COMMUNITY)
Admission: RE | Admit: 2012-04-27 | Discharge: 2012-04-27 | Disposition: A | Discharge status: Home / Self Care | Payer: Medicare (Managed Care) | Source: Ambulatory Visit | Attending: Surgery | Admitting: Surgery

## 2012-04-27 DIAGNOSIS — Z01818 Encounter for other preprocedural examination: Secondary | ICD-10-CM | POA: Insufficient documentation

## 2012-04-27 HISTORY — PX: BREATH TEK H PYLORI: SHX5422

## 2012-04-27 SURGERY — BREATH TEST, FOR HELICOBACTER PYLORI

## 2012-04-28 ENCOUNTER — Encounter (HOSPITAL_COMMUNITY): Payer: Self-pay | Admitting: Surgery

## 2012-05-05 ENCOUNTER — Ambulatory Visit (HOSPITAL_COMMUNITY): Payer: Medicare (Managed Care)

## 2012-05-05 ENCOUNTER — Other Ambulatory Visit: Payer: Self-pay | Admitting: Internal Medicine

## 2012-05-08 ENCOUNTER — Ambulatory Visit: Payer: Medicare (Managed Care) | Admitting: *Deleted

## 2012-05-25 ENCOUNTER — Ambulatory Visit (HOSPITAL_COMMUNITY): Payer: Medicare (Managed Care)

## 2012-05-28 ENCOUNTER — Ambulatory Visit: Payer: No Typology Code available for payment source | Admitting: Internal Medicine

## 2012-06-05 ENCOUNTER — Ambulatory Visit: Payer: Medicare (Managed Care) | Admitting: *Deleted

## 2012-06-21 ENCOUNTER — Telehealth: Payer: Self-pay | Admitting: Internal Medicine

## 2012-06-21 MED ORDER — DULOXETINE HCL 60 MG PO CPEP: 60.0000 mg | ORAL_CAPSULE | Freq: Every day | ORAL | Status: DC

## 2012-06-21 NOTE — Telephone Encounter (Signed)
Done hardcopy to robin  

## 2012-06-21 NOTE — Telephone Encounter (Signed)
Spouse/Kalli calling about prescription for generic Cymbalta.  Has gotten this in the past from [place], now has found a place in [place] to obtain the prescription.   Would like Rx for generic Cymbalta either Faxed to [facility]  [phone]  (phone # [phone]).   If unable to send prescription to [facility], asking that prescription be written and she will pick it up to mail.  Wife/Kalli can be reached at  [phone] as needed.

## 2012-06-22 NOTE — Telephone Encounter (Signed)
Faxed hardcopy to number requested and called the patients wife to pickup hardcopy as well to mail

## 2012-08-23 ENCOUNTER — Other Ambulatory Visit: Payer: Self-pay | Admitting: Internal Medicine

## 2012-09-27 ENCOUNTER — Encounter: Payer: Self-pay | Admitting: Pulmonary Disease

## 2012-09-27 ENCOUNTER — Ambulatory Visit (INDEPENDENT_AMBULATORY_CARE_PROVIDER_SITE_OTHER): Payer: Medicare (Managed Care) | Admitting: Pulmonary Disease

## 2012-09-27 VITALS — BP 128/82 | HR 74 | Temp 98.1°F | Ht 67.0 in | Wt 272.0 lb

## 2012-09-27 DIAGNOSIS — G4733 Obstructive sleep apnea (adult) (pediatric): Secondary | ICD-10-CM

## 2012-09-27 NOTE — Patient Instructions (Signed)
Your CPAP is at 10 cm

## 2012-09-27 NOTE — Assessment & Plan Note (Signed)
Weight loss encouraged, compliance with goal of at least 4-6 hrs every night is the expectation. Advised against medications with sedative side effects Cautioned against driving when sleepy - understanding that sleepiness will vary on a day to day basis Ct cpap 10 cm - ok to get new CPAP if he qualifies

## 2012-09-27 NOTE — Progress Notes (Signed)
  Subjective:    Patient ID: Darren Black, male    DOB: 14-Jun-1966, 47 y.o.   MRN: [id number]  HPI 45/M , originally from from [place] for FU of severe obstructive sleep apnea .  PSG in 2008 showed AHI 29/h, wt was 252 lbs, CPAP was initiated at 12 cm based on autotitration data, then lowered to 10 cm  His usual bedtime is around 11 p.m. to midnight. He generally sleeps on his side. He used to take a couple of hours sometimes to fall asleep, but now that he takes Ativan at night, his sleep latency is considerably decreased. He wakes up 3 times occasionally due to back pain but mostly for bathroom visits. He denies any postvoid sleep latency. He finally gets out of bed at around 7 a.m. feeling tired, sleepy, with an occasional headache. He has gained about 35 pounds since his last visit in 2008.  Epworth Sleepiness Score 11/24  He has switched from [facility] to [facility] , since it is cheaper for him to obtain supplies.  Download 1/30-2/13/13  He has mild leak on his mask but events seem well controlled on cpap 10 cm    09/27/2012 Lost 14 lbs, 286-> 272 Sleeping well , Wife has not noted snoring on current settings.  Wakes up rested, no leak, mask ok, pressure ok, no daytime somnolence.  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 Also denies presyncope, palpitations, heartburn, abdominal pain, nausea, vomiting, diarrhea or change in bowel or urinary habits, dysuria,hematuria, rash, arthralgias, visual complaints, headache, numbness weakness or ataxia.     Objective:   Physical Exam  Gen. Pleasant, obese, in no distress ENT - no lesions, no post nasal drip Neck:  No JVD, no thyromegaly, no carotid bruits Lungs: no use of accessory muscles, no dullness to percussion, decreased without rales or rhonchi  Cardiovascular: Rhythm regular, heart sounds  normal, no murmurs or gallops, no peripheral edema Musculoskeletal: No deformities, no cyanosis or clubbing , no tremors        Assessment & Plan:

## 2012-10-06 ENCOUNTER — Other Ambulatory Visit: Payer: Self-pay | Admitting: Internal Medicine

## 2012-10-11 ENCOUNTER — Ambulatory Visit: Payer: No Typology Code available for payment source | Admitting: Internal Medicine

## 2012-10-19 ENCOUNTER — Telehealth: Payer: Self-pay

## 2012-10-19 NOTE — Telephone Encounter (Signed)
The patient would like a refill on his Clomid 50 mg please advise.  Request faxed to [facility] pharmacy in [place] [phone]

## 2012-10-19 NOTE — Telephone Encounter (Signed)
Called pt spoke with wife gave md response.../lmb 

## 2012-10-19 NOTE — Telephone Encounter (Signed)
I normally dont prescribe this  I believe he may need to call Dr Everardo All or his endocrinologist

## 2012-11-02 ENCOUNTER — Other Ambulatory Visit: Payer: Self-pay | Admitting: Internal Medicine

## 2013-01-12 DIAGNOSIS — Z0279 Encounter for issue of other medical certificate: Secondary | ICD-10-CM

## 2013-01-31 ENCOUNTER — Other Ambulatory Visit (INDEPENDENT_AMBULATORY_CARE_PROVIDER_SITE_OTHER): Payer: Medicare Other

## 2013-01-31 ENCOUNTER — Encounter: Payer: Self-pay | Admitting: Internal Medicine

## 2013-01-31 ENCOUNTER — Ambulatory Visit (INDEPENDENT_AMBULATORY_CARE_PROVIDER_SITE_OTHER): Payer: Medicare Other | Admitting: Internal Medicine

## 2013-01-31 VITALS — BP 110/72 | HR 74 | Temp 98.2°F | Ht 67.0 in | Wt 244.4 lb

## 2013-01-31 DIAGNOSIS — E785 Hyperlipidemia, unspecified: Secondary | ICD-10-CM

## 2013-01-31 DIAGNOSIS — I10 Essential (primary) hypertension: Secondary | ICD-10-CM

## 2013-01-31 DIAGNOSIS — M5416 Radiculopathy, lumbar region: Secondary | ICD-10-CM | POA: Insufficient documentation

## 2013-01-31 DIAGNOSIS — E230 Hypopituitarism: Secondary | ICD-10-CM

## 2013-01-31 DIAGNOSIS — F329 Major depressive disorder, single episode, unspecified: Secondary | ICD-10-CM

## 2013-01-31 LAB — HEPATIC FUNCTION PANEL
ALT: 67 U/L — ABNORMAL HIGH (ref 0–53)
AST: 39 U/L — ABNORMAL HIGH (ref 0–37)
Alkaline Phosphatase: 66 U/L (ref 39–117)
Bilirubin, Direct: 0.1 mg/dL (ref 0.0–0.3)
Total Bilirubin: 0.7 mg/dL (ref 0.3–1.2)

## 2013-01-31 LAB — CBC WITH DIFFERENTIAL/PLATELET
Basophils Absolute: 0.1 10*3/uL (ref 0.0–0.1)
Eosinophils Absolute: 0.2 10*3/uL (ref 0.0–0.7)
Lymphs Abs: 1.4 10*3/uL (ref 0.7–4.0)
MCHC: 34.7 g/dL (ref 30.0–36.0)
MCV: 77.8 fl — ABNORMAL LOW (ref 78.0–100.0)
Monocytes Absolute: 0.6 10*3/uL (ref 0.1–1.0)
Neutrophils Relative %: 72.3 % (ref 43.0–77.0)
Platelets: 206 10*3/uL (ref 150.0–400.0)
RDW: 15.3 % — ABNORMAL HIGH (ref 11.5–14.6)
WBC: 8.1 10*3/uL (ref 4.5–10.5)

## 2013-01-31 LAB — LIPID PANEL
HDL: 36.4 mg/dL — ABNORMAL LOW (ref >39.00)
LDL Cholesterol: 77 mg/dL (ref 0–99)
Total CHOL/HDL Ratio: 4
Triglycerides: 145 mg/dL (ref 0.0–149.0)

## 2013-01-31 LAB — BASIC METABOLIC PANEL
BUN: 23 mg/dL (ref 6–23)
Chloride: 101 mEq/L (ref 96–112)
Potassium: 4 mEq/L (ref 3.5–5.1)
Sodium: 139 mEq/L (ref 135–145)

## 2013-01-31 NOTE — Progress Notes (Signed)
Subjective:    Patient ID: Darren Black, male    DOB: July 19, 1966, 47 y.o.   MRN: [id number]  HPI  Here to f/u; overall doing ok,  Pt denies chest pain, increased sob or doe, wheezing, orthopnea, PND, increased LE swelling, palpitations, or syncope, but has had several episodes dizziness, worse with standing more quickly, worse in the past month, no syncope or falls or injury.   Pt denies polydipsia, polyuria, or low sugar symptoms such as weakness or confusion improved with po intake.  Pt denies new neurological symptoms such as new headache, or facial or extremity weakness or numbness.   Pt states overall good compliance with meds, has been trying to follow lower cholesterol diet.  Pt lost from 295 to 244 intentionally since aug last yr. Mentions chronic right sciatica/RLE weakness ongoing permanent after extensive prior surgical eval.  Denies worsening depressive symptoms, suicidal ideation, or panic Past Medical History  Diagnosis Date  . ALLERGIC RHINITIS 07/13/2007  . ANXIETY 05/28/2007  . BACK PAIN, CHRONIC, HX OF 05/28/2007  . DEPRESSION 05/28/2007  . DIZZINESS 07/13/2007  . FATIGUE 07/13/2009  . HAY FEVER 05/28/2007  . HSV 01/11/2008  . HYPERLIPIDEMIA 10/09/2009  . HYPERTENSION 05/28/2007  . HYPOGONADISM 04/20/2009  . HYPOPITUITARISM 05/15/2009  . LIBIDO, DECREASED 04/11/2009  . ORAL ULCER 01/11/2008  . PERIPHERAL EDEMA 02/08/2009  . SKIN LESION 04/12/2008  . SLEEP APNEA, OBSTRUCTIVE 05/29/2007  . URINARY TRACT INFECTION, HX OF 05/28/2007   Past Surgical History  Procedure Laterality Date  . Appendectomy  2003  . Shoulder surgery  01/21/2012    retator cuff RT  . Breath tek h pylori  04/27/2012    Procedure: BREATH TEK H PYLORI;  Surgeon: Valarie Merino, MD;  Location: [facility];  Service: General;  Laterality: N/A;    reports that he has never smoked. He has never used smokeless tobacco. He reports that  drinks alcohol. He reports that he does not use illicit drugs. family history  includes Cancer in his father; Coronary artery disease in his father; Diabetes in his other; Hyperlipidemia in his other; Hypertension in his other; and Stroke in his other. No Known Allergies Current Outpatient Prescriptions on File Prior to Visit  Medication Sig Dispense Refill  . amLODipine (NORVASC) 10 MG tablet TAKE ONE-HALF TABLET BY MOUTH EVERY DAY  45 tablet  3  . benazepril (LOTENSIN) 40 MG tablet TAKE ONE TABLET BY MOUTH EVERY DAY  90 tablet  3  . DULoxetine (CYMBALTA) 60 MG capsule Take 1 capsule (60 mg total) by mouth daily.  90 capsule  3  . hydrochlorothiazide (HYDRODIURIL) 25 MG tablet TAKE ONE-HALF TABLET BY MOUTH EVERY DAY  90 tablet  3  . KLOR-CON M10 10 MEQ tablet TAKE ONE TABLET BY MOUTH EVERY DAY  90 tablet  2  . LORazepam (ATIVAN) 2 MG tablet Take 0.5 tablets (1 mg total) by mouth 2 (two) times daily as needed for anxiety. Take 1/2 by mouth two times a day as needed  60 tablet  2  . Multiple Vitamin (MULTIVITAMIN) tablet Take 1 tablet by mouth daily.        . Omega 3-6-9 Fatty Acids (OMEGA 3-6-9 COMPLEX PO) Take by mouth daily.        Marland Kitchen oxyCODONE-acetaminophen (PERCOCET/ROXICET) 5-325 MG per tablet       . pravastatin (PRAVACHOL) 40 MG tablet TAKE ONE TABLET BY MOUTH EVERY DAY  90 tablet  2  . [DISCONTINUED] potassium chloride (KLOR-CON 10) 10 MEQ CR tablet  Take 1 tablet (10 mEq total) by mouth daily.  90 tablet  3   No current facility-administered medications on file prior to visit.   Review of Systems  Constitutional: Negative for unexpected weight change, or unusual diaphoresis  HENT: Negative for tinnitus.   Eyes: Negative for photophobia and visual disturbance.  Respiratory: Negative for choking and stridor.   Gastrointestinal: Negative for vomiting and blood in stool.  Genitourinary: Negative for hematuria and decreased urine volume.  Musculoskeletal: Negative for acute joint swelling Skin: Negative for color change and wound.  Neurological: Negative for  tremors and numbness other than noted  Psychiatric/Behavioral: Negative for decreased concentration or  hyperactivity.       Objective:   Physical Exam BP 110/72  Pulse 74  Temp(Src) 98.2 F (36.8 C) (Oral)  Ht 5\' 7"  (1.702 m)  Wt 244 lb 6 oz (110.848 kg)  BMI 38.27 kg/m2  SpO2 97% VS noted, less obese, not ill appearing Constitutional: Pt appears well-developed and well-nourished.  HENT: Head: NCAT.  Right Ear: External ear normal.  Left Ear: External ear normal.  Eyes: Conjunctivae and EOM are normal. Pupils are equal, round, and reactive to light.  Neck: Normal range of motion. Neck supple.  Cardiovascular: Normal rate and regular rhythm.   Pulmonary/Chest: Effort normal and breath sounds normal.  Abd:  Soft, NT, non-distended, + BS Neurological: Pt is alert. Not confused, motor 4+/5 RLE - chronic  Skin: Skin is warm. No erythema.  Psychiatric: Pt behavior is normal. Thought content normal. not depressed affect    Assessment & Plan:

## 2013-01-31 NOTE — Assessment & Plan Note (Addendum)
prob overcontrolled overall by history and exam with recent wt loss, recent data reviewed with pt, and pt to d/c coreg, consider d/c amlodipine as well,  to f/u any worsening symptoms or concerns BP Readings from Last 3 Encounters:  01/31/13 110/72  09/27/12 128/82  04/22/12 124/80

## 2013-01-31 NOTE — Assessment & Plan Note (Signed)
Due for f/u, will refer to endo,  to f/u any worsening symptoms or concerns

## 2013-01-31 NOTE — Patient Instructions (Signed)
OK to stop the carvedilol Please monitor you Blood Pressures, especially in about 5-7 days and after, and especially if any further dizzy episodes, b/c if the BP is still on the Lower side (such as 90-100 with the top number), we may need to stop the amlodipine as well Please continue all other medications as before You will be contacted regarding the referral for: endocrinology regarding the low pituitary function Please go to the LAB in the Basement (turn left off the elevator) for the tests to be done at the time you may have other testing with endocrinology soon  You will be contacted by phone if any changes need to be made immediately.  Otherwise, you will receive a letter about your results with an explanation  Please remember to sign up for My Chart if you have not done so, as this will be important to you in the future with finding out test results, communicating by private email, and scheduling acute appointments online when needed.  Please return in 6 months, or sooner if needed

## 2013-01-31 NOTE — Assessment & Plan Note (Signed)
stable overall by history and exam, recent data reviewed with pt, and pt to continue medical treatment as before,  to f/u any worsening symptoms or concerns Lab Results  Component Value Date   LDLCALC 77 01/31/2013

## 2013-01-31 NOTE — Assessment & Plan Note (Signed)
stable overall by history and exam, recent data reviewed with pt, and pt to continue medical treatment as before,  to f/u any worsening symptoms or concerns Lab Results  Component Value Date   WBC 8.1 01/31/2013   HGB 14.8 01/31/2013   HCT 42.7 01/31/2013   PLT 206.0 01/31/2013   GLUCOSE 91 01/31/2013   CHOL 142 01/31/2013   TRIG 145.0 01/31/2013   HDL 36.40* 01/31/2013   LDLDIRECT 115.3 04/22/2011   LDLCALC 77 01/31/2013   ALT 67* 01/31/2013   AST 39* 01/31/2013   NA 139 01/31/2013   K 4.0 01/31/2013   CL 101 01/31/2013   CREATININE 1.4 01/31/2013   BUN 23 01/31/2013   CO2 30 01/31/2013   TSH 1.12 04/05/2012   PSA 0.72 04/05/2012   HGBA1C 6.3 09/15/2011

## 2013-02-07 ENCOUNTER — Ambulatory Visit (INDEPENDENT_AMBULATORY_CARE_PROVIDER_SITE_OTHER): Payer: Medicare Other | Admitting: Internal Medicine

## 2013-02-07 ENCOUNTER — Encounter: Payer: Self-pay | Admitting: Internal Medicine

## 2013-02-07 VITALS — BP 122/74 | HR 75 | Temp 99.0°F | Resp 12 | Ht 67.0 in | Wt 240.0 lb

## 2013-02-07 DIAGNOSIS — E280 Estrogen excess: Secondary | ICD-10-CM

## 2013-02-07 DIAGNOSIS — E291 Testicular hypofunction: Secondary | ICD-10-CM

## 2013-02-07 DIAGNOSIS — E230 Hypopituitarism: Secondary | ICD-10-CM

## 2013-02-07 NOTE — Progress Notes (Signed)
Subjective:     Patient ID: Darren Black, male   DOB: [birth year], 47 y.o.   MRN: [id number]  HPI Darren Black is a pleasant 47 year old man, referred by his PCP, Dr. Jonny Ruiz, for evaluation and treatment of hypogonadism, with questionable hypopituitarism.  He was diagnosed with hypogonadism in 2010 by Dr. Everardo All. At that time, he was complaining of fatigue, weight gain, low libido, problems with erections. She was initially started on AndroGel, however this did not improve his symptoms, and he was also afraid not to transfer its to his wife. He was then started on Clomid, on which he felt a little better. She was on it for approximately 3 years, until he ran out on 10/2012.   I reviewed his testosterone levels available in Epic:  The patient has been trying hard to lose weight, and he was successful to lose 30 pounds in the last 4 months, and even more in the last year. He succeeded in doing that by mostly diet. He also uses their  [employer] vibrating platform and walks around the neighborhood, but this is limited by his back pain. Also for weight loss, he tried the Beta HCG diet last fall, which jumpstarted his weight loss, as it caused him to lose 20 lbs. His back pain is better on Cymbalta. He has anxiety and depression, both improved by Cymbalta recently. He has had obstructive sleep apnea, but is very compliant with his CPAP machine, which is adjusted every year.   No thyroid problems. Father had thyroid cancer. No family history of adrenal insufficiency or pituitary problems.  He admits for decreased libido, along with generalized fatigue.  He wakes up twice to go to the restroom, but otherwise sleeps well. No BPH. No increased PSA per review of the chart. + problems with erections: both getting and maintaining an erection No trauma to testes, testicular irradiation or surgery No h/o of mumps orchitis/h/o autoimmune ds. He grew and went through puberty like his peers No  incomplete/delayed sexual development  No breast discomfort/gynecomastia No loss of body hair (axillary/pubic)/decreased need for shaving No very small testes (<5 ml)/has shrinking testes in last 10 years No height loss No abnormal sense of smell  + hot flushes + personal history of infertility, he does not have children. Sperm count low in 1996. + FH of infertility - Sr. And brother No vision problem + head trauma - 2 concussions at 47 y/o  No worst HA of his life No known FH of hemochromatosis or pituitary tumors No chronic diseases. + chronic back pain. On Vicodin - once or twice a week: does not currently take steroids - last May for elbow sx  No more than 2 drinks a day of alcohol at a time No anabolic steroids use - has used AS when he was younger, not recently No herbal medicines other than green tea.  A TSH was obtained on 04/05/2012, and this was normal at 1.12. Hemoglobin A1c obtained on 09/15/2011 was 6.3%. Denies increased thirst and urination.  The patient had a brain MRI in 2010 showing no posterior pituitary bright spot.  PMH: I reviewed his chart including office notes, telephone notes, labs, and available scanned records. He also has a history of hypertension, hyperlipidemia, history of HSV, peripheral edema, anxiety/depression, chronic back pain, transaminitis, and morbid obesity.  Past Surgical History  Procedure Laterality Date  . Appendectomy  2003  . Shoulder surgery  01/21/2012    retator cuff RT  . Breath tek h pylori  04/27/2012    Procedure: BREATH TEK H PYLORI;  Surgeon: Valarie Merino, MD;  Location: [facility];  Service: General;  Laterality: N/A;   History   Social History  . Marital Status: Married    Spouse Name: N/A    Number of Children: 0. Has 2 adopted children.   Occupational History  . disabled, LBP and anxiety    Social History Main Topics  . Smoking status: Never Smoker   . Smokeless tobacco: Never Used  . Alcohol Use: Yes      Comment: rare  . Drug Use: No   Current Outpatient Prescriptions on File Prior to Visit  Medication Sig Dispense Refill  . amLODipine (NORVASC) 10 MG tablet TAKE ONE-HALF TABLET BY MOUTH EVERY DAY  45 tablet  3  . benazepril (LOTENSIN) 40 MG tablet TAKE ONE TABLET BY MOUTH EVERY DAY  90 tablet  3  . DULoxetine (CYMBALTA) 60 MG capsule Take 1 capsule (60 mg total) by mouth daily.  90 capsule  3  . hydrochlorothiazide (HYDRODIURIL) 25 MG tablet TAKE ONE-HALF TABLET BY MOUTH EVERY DAY  90 tablet  3  . KLOR-CON M10 10 MEQ tablet TAKE ONE TABLET BY MOUTH EVERY DAY  90 tablet  2  . LORazepam (ATIVAN) 2 MG tablet Take 0.5 tablets (1 mg total) by mouth 2 (two) times daily as needed for anxiety. Take 1/2 by mouth two times a day as needed  60 tablet  2  . Multiple Vitamin (MULTIVITAMIN) tablet Take 1 tablet by mouth daily.        . Omega 3-6-9 Fatty Acids (OMEGA 3-6-9 COMPLEX PO) Take by mouth daily.        . pravastatin (PRAVACHOL) 40 MG tablet TAKE ONE TABLET BY MOUTH EVERY DAY  90 tablet  2  . oxyCODONE-acetaminophen (PERCOCET/ROXICET) 5-325 MG per tablet       . [DISCONTINUED] potassium chloride (KLOR-CON 10) 10 MEQ CR tablet Take 1 tablet (10 mEq total) by mouth daily.  90 tablet  3   No current facility-administered medications on file prior to visit.   No Known Allergies  Family History  Problem Relation Age of Onset  . Coronary artery disease Father   . Cancer Father     anaplastic thyroid cancer  . Stroke Other     1st degree relative  . Diabetes Other     1st degree relative  . Hypertension Other   . Hyperlipidemia Other    Review of Systems- per history of present illness + Constitutional: + weight loss, + fatigue, + subjective hyperthermia, + hot flashes, + nocturia x2 Eyes: no blurry vision, no xerophthalmia ENT: no sore throat, no nodules palpated in throat, no dysphagia/odynophagia, no hoarseness Cardiovascular: no CP/SOB/palpitations/leg swelling Respiratory: no  cough/SOB Gastrointestinal: no N/V/D/C Musculoskeletal: + muscle/+ joint aches Skin: no rashes Neurological: no tremors/numbness/tingling/dizziness Psychiatric: + both depression/anxiety  Objective:   Physical Exam BP 122/74  Pulse 75  Temp(Src) 99 F (37.2 C) (Oral)  Resp 12  Ht 5\' 7"  (1.702 m)  Wt 240 lb (108.863 kg)  BMI 37.58 kg/m2  SpO2 96% Wt Readings from Last 3 Encounters:  02/07/13 240 lb (108.863 kg)  01/31/13 244 lb 6 oz (110.848 kg)  09/27/12 272 lb (123.378 kg)   Constitutional: overweight, in NAD Eyes: PERRLA, EOMI, no exophthalmos ENT: moist mucous membranes, no thyromegaly, no cervical lymphadenopathy Cardiovascular: RRR, No MRG Respiratory: CTA B Gastrointestinal: abdomen soft, NT, ND, BS+ Musculoskeletal: no deformities, strength intact in all 4 Skin:  moist, warm, no rashes Neurological: no tremor with outstretched hands, DTR normal in all 4 Genital exam: refused  Assessment:     1. Hypogonadism - questionable hypopituitarism    Plan:     1. Patient found to be hypogonadic in 2010, with both free and total testosterone < LLN. He was initially on AndroGel, from which he felt he did not benefit much, and he was also worried that he might be transferring to his wife. He then switched to Clomid, on which, he again did not feel a large defect, but better than on Androgel. He would like to establish care with me for his hypogonadism and also to find out whether he has hypopituitarism.  - Reviewing his MRI images and report from 2010, there was no pituitary tumor, however he had lack of the posterior pituitary bright spot, which can be found idiopatically, with an adenoma, or in the context of infiltrative diseases. He does not have a history of histiocytosis X, and his hemoglobin has not been elevated to indicate hemochromatosis. He has no signs of DI. There are no obvious signs of adrenal insufficiency, Cushing ds, acromegaly, or thyroid disorders, but will  check his hormonal axes.  - we did discuss that his low testosterone could have been caused by obstructive sleep apnea and obesity, however he did lose a lot of weight recently and he still feels fatigued, has low libido and has problems with erections. The fact that he was infertile and with a low sperm count in the past makes me think that his hypogonadism is probably permanent, and probably not related to his previous weight gain  - I will check his pituitary hormones along with tests for hemochromatosis (ferritin, TIBC), diabetes (hemoglobin A1c), and testicular tumors (beta-hCG, estradiol). I would also add a PSA, since his last one was a year ago although it was normal. - We discussed about possible routes for treatment, main ones being repeat a trial of AndroGel or repeat Clomid. I would probably favor AndroGel, but we did discuss about the cardiac risk of testosterone in the newer studies. His father had heart problems and had CABG at 100. Patient does not have a personal history of heart disease, although he does have risk factors. If we decide to start testosterone, I would target a level lower than the mid normal range. We discussed about correct administration of AndroGel, so that he doesn't rub it off or pass it to his wife - patient will return to have labs drawn tomorrow morning at 8 AM, fasting - I will see him back in 3 months  Component     Latest Ref Rng 02/08/2013  LH     1.50 - 9.30 mIU/mL 2.49  FSH     1.4 - 18.1 mIU/ML 1.4  Ferritin     22.0 - 322.0 ng/mL 97.0  TSH     0.35 - 5.50 uIU/mL 1.77  T3, Free     2.3 - 4.2 pg/mL 2.5  Free T4     0.60 - 1.60 ng/dL 4.09  hCG, Beta Chain, Quant, S      0.02  Cortisol, Plasma      17.6  PSA     0.10 - 4.00 ng/mL 0.77  Hemoglobin A1C     4.6 - 6.5 % 5.5   Dear Mr Vitelli,  Not all labs are back yet, but this is what I have so far: no thyroid, adrenal problem, no increased PSA (prostate marker), no diabetes or prediabetes,  no  hemochromatosis (increased iron load). This is good news. Will let you know about the rest when they become available.  Please let me know if you have any questions.  Sincerely,  Carlus Pavlov MD   02/28/2013: Component     Latest Ref Rng 02/08/2013  Testosterone     300 - 890 ng/dL 161 (L)  Sex Hormone Binding     13 - 71 nmol/L 22  Testosterone Free     47.0 - 244.0 pg/mL 57.7  Testosterone-% Freee.     1.6 - 2.9 % 2.4  Estradiol, Free      0.56 (H)  Estradiol      23  Results received      02/27/13  Somatomedin (IGF-I)     55 - 213 ng/mL 209   Dear Ms. Dibiasio,  Believe it or not, the estradiol level only came back today. It is a little bit high, therefore, I suggest that we check an ultrasound of your testes to make sure that there is no estrogen producing mass. Your free testosterone level was within the normal range although on the low side. The LH and FSH levels (pituitary hormones that stimulate testosterone secretion) were low, which can be secondary to higher estradiol level. Alternatively, you may have mild secondary hypogonadism, but I do not clear evidence that you have deficiencies of other pituitary hormones.  I will ordered a testicular ultrasound and we'll talk again when the results are back.  Please let me know if you have any questions.  Sincerely,  Carlus Pavlov MD   03/10/2013:  *RADIOLOGY REPORT*  Clinical Data: Hyperestrogenism  ULTRASOUND OF SCROTUM  Technique: Complete ultrasound examination of the testicles, epididymis, and other scrotal structures was performed.  Comparison: None.  Findings:  Right testis: The right testicle is of normal size and echotexture, measuring 4.2 x 2.3 x 2.8 cm. Normal and symmetric color Doppler flow is present.  Left testis: The left testicle is of normal size and echotexture measuring 4.2 x 2.3 x 2.5 cm. Normal and symmetric color Doppler flow is present.  Right epididymis: A cyst in the right  epididymal head measures 5 mm and appears benign. The epididymis is otherwise within normal limits peri  Left epididymis: Normal in size and appearance.  Hydrocele: There is small amount of simple fluid is present bilaterally, right greater than left.  Varicocele: Absent  IMPRESSION:  1. Normal appearance of the testicles bilaterally. 2. Small benign-appearing epididymal cyst versus spermatocele. 3. Small simple appearing hydroceles, right greater than left.   Original Report Authenticated By: Marin Roberts, M.D.   Dear Ms Cindie Laroche, The results of the ultrasounds are back: no testicular suspicious mass. This is great news. No intervention needed for now.  We can do 2 things: - have you follow with dr. Jonny Ruiz and return to me as needed in the future - schedule a new appointment for 6 months from now to recheck your testosterone level. If you decide for the second option, please call our front desk to move your appt from 05/2013 to 08/2013 [phone]). Please let me know if you have any questions. Sincerely, Carlus Pavlov MD

## 2013-02-07 NOTE — Patient Instructions (Addendum)
Please return in 3 months. I will send you the results through MyChart.

## 2013-02-08 ENCOUNTER — Ambulatory Visit (INDEPENDENT_AMBULATORY_CARE_PROVIDER_SITE_OTHER): Payer: Medicare Other

## 2013-02-08 DIAGNOSIS — E291 Testicular hypofunction: Secondary | ICD-10-CM

## 2013-02-08 DIAGNOSIS — E230 Hypopituitarism: Secondary | ICD-10-CM

## 2013-02-08 LAB — LUTEINIZING HORMONE: LH: 2.49 m[IU]/mL (ref 1.50–9.30)

## 2013-02-08 LAB — PROLACTIN: Prolactin: 11.3 ng/mL (ref 2.1–17.1)

## 2013-02-08 LAB — FOLLICLE STIMULATING HORMONE: FSH: 1.4 m[IU]/mL (ref 1.4–18.1)

## 2013-02-08 LAB — HEMOGLOBIN A1C: Hgb A1c MFr Bld: 5.5 % (ref 4.6–6.5)

## 2013-02-08 LAB — IRON AND TIBC
%SAT: 37 % (ref 20–55)
Iron: 99 ug/dL (ref 42–165)
TIBC: 269 ug/dL (ref 215–435)
UIBC: 170 ug/dL (ref 125–400)

## 2013-02-08 LAB — T4, FREE: Free T4: 0.79 ng/dL (ref 0.60–1.60)

## 2013-02-08 LAB — CORTISOL: Cortisol, Plasma: 17.6 ug/dL

## 2013-02-08 LAB — FERRITIN: Ferritin: 97 ng/mL (ref 22.0–322.0)

## 2013-02-09 LAB — ACTH: C206 ACTH: 26 pg/mL (ref 10–46)

## 2013-02-09 LAB — TESTOSTERONE, FREE, TOTAL, SHBG
Testosterone, Free: 57.7 pg/mL (ref 47.0–244.0)
Testosterone-% Free: 2.4 % (ref 1.6–2.9)

## 2013-02-17 ENCOUNTER — Encounter: Payer: Self-pay | Admitting: Internal Medicine

## 2013-02-17 NOTE — Telephone Encounter (Signed)
Please read note.

## 2013-02-25 ENCOUNTER — Telehealth: Payer: Self-pay | Admitting: *Deleted

## 2013-02-25 NOTE — Telephone Encounter (Signed)
Carollee Herter, can they at least result/fax the other 5 labs: testosterone, prolactin, IGF1, etc.? Thanks.

## 2013-02-25 NOTE — Telephone Encounter (Signed)
Darren Black is faxing all other labs.

## 2013-02-25 NOTE — Telephone Encounter (Signed)
Called [facility] and estradiol has still not resulted. Will check again next week.

## 2013-02-28 ENCOUNTER — Encounter: Payer: Self-pay | Admitting: Internal Medicine

## 2013-02-28 ENCOUNTER — Other Ambulatory Visit: Payer: Self-pay | Admitting: Endocrinology

## 2013-02-28 ENCOUNTER — Other Ambulatory Visit: Payer: Self-pay | Admitting: Internal Medicine

## 2013-02-28 DIAGNOSIS — E280 Estrogen excess: Secondary | ICD-10-CM

## 2013-02-28 LAB — ESTRADIOL, FREE: Estradiol: 23 pg/mL

## 2013-03-01 ENCOUNTER — Other Ambulatory Visit: Payer: Self-pay | Admitting: *Deleted

## 2013-03-07 ENCOUNTER — Ambulatory Visit
Admission: RE | Admit: 2013-03-07 | Discharge: 2013-03-07 | Disposition: A | Discharge status: Home / Self Care | Payer: Medicare Other | Source: Ambulatory Visit | Attending: Internal Medicine | Admitting: Internal Medicine

## 2013-03-09 ENCOUNTER — Encounter: Payer: Self-pay | Admitting: Internal Medicine

## 2013-04-27 ENCOUNTER — Other Ambulatory Visit: Payer: Self-pay | Admitting: Internal Medicine

## 2013-05-04 ENCOUNTER — Other Ambulatory Visit: Payer: Self-pay | Admitting: Internal Medicine

## 2013-05-09 ENCOUNTER — Ambulatory Visit: Payer: Medicare Other | Admitting: Internal Medicine

## 2013-05-17 ENCOUNTER — Other Ambulatory Visit: Payer: Self-pay | Admitting: Internal Medicine

## 2013-07-07 ENCOUNTER — Other Ambulatory Visit: Payer: Self-pay

## 2013-07-18 ENCOUNTER — Telehealth: Payer: Self-pay | Admitting: Pulmonary Disease

## 2013-07-18 NOTE — Telephone Encounter (Signed)
lmomtcb x1--is this a CMN?

## 2013-07-19 NOTE — Telephone Encounter (Signed)
LMTCBx2, CMN? Carron Curie, CMA

## 2013-07-19 NOTE — Telephone Encounter (Signed)
I have cmn on pt and will give to Alida.

## 2013-07-19 NOTE — Telephone Encounter (Signed)
cmn filled out and put in Dr Vassie Loll blue folder for signature. Left msg for Katie @ [facility] in ref to status of this cmn .Kandice Hams

## 2013-07-19 NOTE — Telephone Encounter (Signed)
Katie w/ [facility] returned call Spoke with Florentina Addison who reported that she had sent this CMN on patient on 11.13 and was calling to check on the status/when a response can be expected Carlyle Lipa that CMN was given to Alida who takes care of these for the department today Advised Florentina Addison that RA is not back in the office until Monday 11.24 Will send to Alida and Mindy to ensure that this gets done

## 2013-07-25 ENCOUNTER — Telehealth: Payer: Self-pay

## 2013-07-25 NOTE — Telephone Encounter (Signed)
[name] wife called and is hoping to get a refill of cymbalta faxed to [phone] (mail order)  Tele [phone])  Thanks!

## 2013-07-25 NOTE — Telephone Encounter (Signed)
Has this been signed Dr. Vassie Loll? Carron Curie, CMA

## 2013-07-25 NOTE — Telephone Encounter (Signed)
Y 

## 2013-07-26 MED ORDER — DULOXETINE HCL 60 MG PO CPEP: 60.0000 mg | ORAL_CAPSULE | Freq: Every day | ORAL | Status: DC

## 2013-07-26 NOTE — Telephone Encounter (Signed)
Called the patient informed sent in.  Printed the prescription and faxed to [facility] [place] [phone]

## 2013-08-01 ENCOUNTER — Ambulatory Visit: Payer: Medicare Other | Admitting: Internal Medicine

## 2013-08-02 ENCOUNTER — Ambulatory Visit: Payer: Medicare Other | Admitting: Internal Medicine

## 2013-08-10 ENCOUNTER — Other Ambulatory Visit: Payer: Self-pay | Admitting: Internal Medicine

## 2013-10-04 ENCOUNTER — Ambulatory Visit (INDEPENDENT_AMBULATORY_CARE_PROVIDER_SITE_OTHER): Payer: Managed Care, Other (non HMO) | Admitting: Internal Medicine

## 2013-10-04 ENCOUNTER — Encounter: Payer: Self-pay | Admitting: Internal Medicine

## 2013-10-04 VITALS — BP 148/100 | HR 87 | Temp 98.3°F | Wt 276.1 lb

## 2013-10-04 DIAGNOSIS — M545 Low back pain, unspecified: Secondary | ICD-10-CM

## 2013-10-04 DIAGNOSIS — G8929 Other chronic pain: Secondary | ICD-10-CM

## 2013-10-04 DIAGNOSIS — F411 Generalized anxiety disorder: Secondary | ICD-10-CM

## 2013-10-04 DIAGNOSIS — Z0000 Encounter for general adult medical examination without abnormal findings: Secondary | ICD-10-CM

## 2013-10-04 DIAGNOSIS — I10 Essential (primary) hypertension: Secondary | ICD-10-CM

## 2013-10-04 MED ORDER — AMLODIPINE BESYLATE 5 MG PO TABS: 5.0000 mg | ORAL_TABLET | Freq: Every day | ORAL | Status: DC

## 2013-10-04 MED ORDER — OXYCODONE-ACETAMINOPHEN 5-325 MG PO TABS: 1.0000 | ORAL_TABLET | Freq: Every day | ORAL | Status: DC | PRN

## 2013-10-04 NOTE — Progress Notes (Signed)
Pre-visit discussion using our clinic review tool. No additional management support is needed unless otherwise documented below in the visit note.  

## 2013-10-04 NOTE — Assessment & Plan Note (Signed)
stable overall by history and exam, recent data reviewed with pt, and pt to continue medical treatment as before,  to f/u any worsening symptoms or concerns Lab Results  Component Value Date   WBC 8.1 01/31/2013   HGB 14.8 01/31/2013   HCT 42.7 01/31/2013   PLT 206.0 01/31/2013   GLUCOSE 91 01/31/2013   CHOL 142 01/31/2013   TRIG 145.0 01/31/2013   HDL 36.40* 01/31/2013   LDLDIRECT 115.3 04/22/2011   LDLCALC 77 01/31/2013   ALT 67* 01/31/2013   AST 39* 01/31/2013   NA 139 01/31/2013   K 4.0 01/31/2013   CL 101 01/31/2013   CREATININE 1.4 01/31/2013   BUN 23 01/31/2013   CO2 30 01/31/2013   TSH 1.77 02/08/2013   PSA 0.77 02/08/2013   HGBA1C 5.5 02/08/2013

## 2013-10-04 NOTE — Assessment & Plan Note (Signed)
Overall stable, uses percocet very infreq, Please continue all other medications as before,  Refill done

## 2013-10-04 NOTE — Assessment & Plan Note (Signed)
Uncontrolled today - to re-start amlod 5 mg, cont other meds, cont to work on losing the wt again

## 2013-10-04 NOTE — Progress Notes (Signed)
Subjective:    Patient ID: Darren Black, male    DOB: 1966-05-28, 48 y.o.   MRN: [id number]  HPI  Here to f/u; overall doing ok,  Pt denies chest pain, increased sob or doe, wheezing, orthopnea, PND, increased LE swelling, palpitations, dizziness or syncope.  Pt denies polydipsia, polyuria, or low sugar symptoms such as weakness or confusion improved with po intake.  Pt denies new neurological symptoms such as new headache, or facial or extremity weakness or numbness.   Pt states overall good compliance with meds, has been trying to follow lower cholesterol diet, with wt overall stable,  but little exercise however. Wears CPAP at night, nose in intermittently sore, gets "dried out sinuses' Out of amlodipine, needs to re-start. Can check BP at home but not recently, and gained some wt over the holidays, back on better diet after the super bowl too. Denies worsening depressive symptoms, suicidal ideation, or panic Past Medical History  Diagnosis Date  . ALLERGIC RHINITIS 07/13/2007  . ANXIETY 05/28/2007  . BACK PAIN, CHRONIC, HX OF 05/28/2007  . DEPRESSION 05/28/2007  . DIZZINESS 07/13/2007  . FATIGUE 07/13/2009  . HAY FEVER 05/28/2007  . HSV 01/11/2008  . HYPERLIPIDEMIA 10/09/2009  . HYPERTENSION 05/28/2007  . HYPOGONADISM 04/20/2009  . HYPOPITUITARISM 05/15/2009  . LIBIDO, DECREASED 04/11/2009  . ORAL ULCER 01/11/2008  . PERIPHERAL EDEMA 02/08/2009  . SKIN LESION 04/12/2008  . SLEEP APNEA, OBSTRUCTIVE 05/29/2007  . URINARY TRACT INFECTION, HX OF 05/28/2007   Past Surgical History  Procedure Laterality Date  . Appendectomy  2003  . Shoulder surgery  01/21/2012    retator cuff RT  . Breath tek h pylori  04/27/2012    Procedure: BREATH TEK H PYLORI;  Surgeon: Valarie Merino, MD;  Location: [facility];  Service: General;  Laterality: N/A;    reports that he has never smoked. He has never used smokeless tobacco. He reports that he drinks alcohol. He reports that he does not use illicit  drugs. family history includes Cancer in his father; Coronary artery disease in his father; Diabetes in his other; Hyperlipidemia in his other; Hypertension in his other; Stroke in his other. No Known Allergies Current Outpatient Prescriptions on File Prior to Visit  Medication Sig Dispense Refill  . benazepril (LOTENSIN) 40 MG tablet TAKE ONE TABLET BY MOUTH EVERY DAY  90 tablet  3  . DULoxetine (CYMBALTA) 60 MG capsule Take 1 capsule (60 mg total) by mouth daily.  90 capsule  3  . hydrochlorothiazide (HYDRODIURIL) 25 MG tablet TAKE ONE-HALF TABLET BY MOUTH EVERY DAY  90 tablet  3  . KLOR-CON M10 10 MEQ tablet TAKE ONE TABLET BY MOUTH EVERY DAY  90 tablet  3  . LORazepam (ATIVAN) 2 MG tablet Take 0.5 tablets (1 mg total) by mouth 2 (two) times daily as needed for anxiety. Take 1/2 by mouth two times a day as needed  60 tablet  2  . Multiple Vitamin (MULTIVITAMIN) tablet Take 1 tablet by mouth daily.        . Omega 3-6-9 Fatty Acids (OMEGA 3-6-9 COMPLEX PO) Take by mouth daily.        . pravastatin (PRAVACHOL) 40 MG tablet TAKE ONE TABLET BY MOUTH ONCE DAILY  90 tablet  3  . [DISCONTINUED] potassium chloride (KLOR-CON 10) 10 MEQ CR tablet Take 1 tablet (10 mEq total) by mouth daily.  90 tablet  3   No current facility-administered medications on file prior to visit.   Pt  continues to have recurring LBP without change in severity, bowel or bladder change, fever, wt loss,  worsening LE pain/numbness/weakness, gait change or falls - only using the percocet 1-2 times per wk  Review of Systems  Constitutional: Negative for unexpected weight change, or unusual diaphoresis  HENT: Negative for tinnitus.   Eyes: Negative for photophobia and visual disturbance.  Respiratory: Negative for choking and stridor.   Gastrointestinal: Negative for vomiting and blood in stool.  Genitourinary: Negative for hematuria and decreased urine volume.  Musculoskeletal: Negative for acute joint swelling Skin:  Negative for color change and wound.  Neurological: Negative for tremors and numbness other than noted  Psychiatric/Behavioral: Negative for decreased concentration or  hyperactivity.       Objective:   Physical Exam BP 148/100  Pulse 87  Temp(Src) 98.3 F (36.8 C) (Oral)  Wt 276 lb 2 oz (125.249 kg)  SpO2 95% VS noted,  Constitutional: Pt appears well-developed and well-nourished.  HENT: Head: NCAT.  Right Ear: External ear normal.  Left Ear: External ear normal.  Eyes: Conjunctivae and EOM are normal. Pupils are equal, round, and reactive to light.  Neck: Normal range of motion. Neck supple.  Cardiovascular: Normal rate and regular rhythm.   Pulmonary/Chest: Effort normal and breath sounds normal.  Abd:  Soft, NT, non-distended, + BS Neurological: Pt is alert. Not confused  Skin: Skin is warm. No erythema.  Psychiatric: Pt behavior is normal. Thought content normal. mild nervous only, not depressed affect    Assessment & Plan:

## 2013-10-04 NOTE — Patient Instructions (Signed)
Please take all new medication as prescribed - the amlodipine 5 mg per day Please continue all other medications as before, and refills have been done if requested - the oxycodone Please have the pharmacy call with any other refills you may need.  Please return in 3 months, or sooner if needed, with Lab testing done 3-5 days before

## 2013-10-28 ENCOUNTER — Telehealth: Payer: Self-pay

## 2013-10-28 MED ORDER — AZITHROMYCIN 250 MG PO TABS: ORAL_TABLET | ORAL | Status: DC

## 2013-10-28 NOTE — Telephone Encounter (Signed)
The patient called and stated he was told he had a sinus infection at the dentist today.  He is hoping something for this can be called into the pharmacy    [phone]

## 2013-10-28 NOTE — Telephone Encounter (Signed)
Done erx 

## 2013-10-29 NOTE — Telephone Encounter (Signed)
Patient informed. 

## 2013-11-22 ENCOUNTER — Ambulatory Visit (INDEPENDENT_AMBULATORY_CARE_PROVIDER_SITE_OTHER): Payer: Managed Care, Other (non HMO) | Admitting: Pulmonary Disease

## 2013-11-22 ENCOUNTER — Encounter: Payer: Self-pay | Admitting: Pulmonary Disease

## 2013-11-22 VITALS — BP 132/82 | HR 76 | Temp 98.1°F | Ht 67.0 in | Wt 281.2 lb

## 2013-11-22 DIAGNOSIS — G4733 Obstructive sleep apnea (adult) (pediatric): Secondary | ICD-10-CM

## 2013-11-22 NOTE — Patient Instructions (Signed)
CPAP is set at 10 cm - working well Weight loss encouraged

## 2013-11-22 NOTE — Progress Notes (Signed)
   Subjective:    Patient ID: Darren ShearerJohn Black, male    DOB: 1966-08-29, 48 y.o.   MRN: [id number]  HPI  47/M , originally from from [place] for FU of severe obstructive sleep apnea .  PSG in 2008 showed AHI 29/h, wt was 252 lbs, CPAP was initiated at 12 cm based on autotitration data, then lowered to 10 cm    [facility] , since it is cheaper for him to obtain supplies.  Download 1/30-2/13/13     11/22/2013  Annual FU Gained 272 --> 281  Got new CPAP - download on 10 cm 10/2013 - good usage 9h, AHI 2/h, leak ok Sleeping well , Wife has not noted snoring on current settings.  Wakes up rested, no leak, mask ok, pressure ok, no daytime somnolence.  On 3 meds for BP  Takes ativan 3-4 times/ wk for anxiety     Review of Systems neg for any significant sore throat, dysphagia, itching, sneezing, nasal congestion or excess/ purulent secretions, fever, chills, sweats, unintended wt loss, pleuritic or exertional cp, hempoptysis, orthopnea pnd or change in chronic leg swelling. Also denies presyncope, palpitations, heartburn, abdominal pain, nausea, vomiting, diarrhea or change in bowel or urinary habits, dysuria,hematuria, rash, arthralgias, visual complaints, headache, numbness weakness or ataxia.     Objective:   Physical Exam  Gen. Pleasant, obese, in no distress ENT - no lesions, no post nasal drip Neck: No JVD, no thyromegaly, no carotid bruits Lungs: no use of accessory muscles, no dullness to percussion, decreased without rales or rhonchi  Cardiovascular: Rhythm regular, heart sounds  normal, no murmurs or gallops, no peripheral edema Musculoskeletal: No deformities, no cyanosis or clubbing , no tremors       Assessment & Plan:

## 2013-11-26 NOTE — Assessment & Plan Note (Signed)
CPAP is set at 10 cm & working well  Weight loss encouraged, compliance with goal of at least 4-6 hrs every night is the expectation. Advised against medications with sedative side effects Cautioned against driving when sleepy - understanding that sleepiness will vary on a day to day basis  

## 2014-01-02 ENCOUNTER — Other Ambulatory Visit (INDEPENDENT_AMBULATORY_CARE_PROVIDER_SITE_OTHER): Payer: Managed Care, Other (non HMO)

## 2014-01-02 DIAGNOSIS — Z125 Encounter for screening for malignant neoplasm of prostate: Secondary | ICD-10-CM

## 2014-01-02 DIAGNOSIS — E785 Hyperlipidemia, unspecified: Secondary | ICD-10-CM

## 2014-01-02 DIAGNOSIS — Z0000 Encounter for general adult medical examination without abnormal findings: Secondary | ICD-10-CM

## 2014-01-02 DIAGNOSIS — I10 Essential (primary) hypertension: Secondary | ICD-10-CM

## 2014-01-02 LAB — CBC WITH DIFFERENTIAL/PLATELET
Basophils Absolute: 0 10*3/uL (ref 0.0–0.1)
Basophils Relative: 0.6 % (ref 0.0–3.0)
Eosinophils Absolute: 0.2 10*3/uL (ref 0.0–0.7)
Eosinophils Relative: 2.2 % (ref 0.0–5.0)
HCT: 43.6 % (ref 39.0–52.0)
Hemoglobin: 14.7 g/dL (ref 13.0–17.0)
Lymphocytes Relative: 23.2 % (ref 12.0–46.0)
Lymphs Abs: 1.7 10*3/uL (ref 0.7–4.0)
MCHC: 33.8 g/dL (ref 30.0–36.0)
MCV: 81.6 fl (ref 78.0–100.0)
Monocytes Absolute: 0.6 10*3/uL (ref 0.1–1.0)
Monocytes Relative: 8.2 % (ref 3.0–12.0)
NEUTROS PCT: 65.8 % (ref 43.0–77.0)
Neutro Abs: 4.7 10*3/uL (ref 1.4–7.7)
PLATELETS: 224 10*3/uL (ref 150.0–400.0)
RBC: 5.34 Mil/uL (ref 4.22–5.81)
RDW: 14 % (ref 11.5–14.6)
WBC: 7.2 10*3/uL (ref 4.5–10.5)

## 2014-01-02 LAB — URINALYSIS, ROUTINE W REFLEX MICROSCOPIC
BILIRUBIN URINE: NEGATIVE
HGB URINE DIPSTICK: NEGATIVE
KETONES UR: NEGATIVE
LEUKOCYTES UA: NEGATIVE
Nitrite: NEGATIVE
PH: 6 (ref 5.0–8.0)
Specific Gravity, Urine: >=1.03 — AB (ref 1.000–1.030)
Total Protein, Urine: 100 — AB
UROBILINOGEN UA: 0.2 (ref 0.0–1.0)
Urine Glucose: 100 — AB

## 2014-01-02 LAB — BASIC METABOLIC PANEL
BUN: 16 mg/dL (ref 6–23)
CO2: 32 mEq/L (ref 19–32)
CREATININE: 1.4 mg/dL (ref 0.4–1.5)
Calcium: 9.6 mg/dL (ref 8.4–10.5)
Chloride: 100 mEq/L (ref 96–112)
GFR: 56.38 mL/min — AB (ref >60.00)
GLUCOSE: 80 mg/dL (ref 70–99)
POTASSIUM: 4.1 meq/L (ref 3.5–5.1)
Sodium: 138 mEq/L (ref 135–145)

## 2014-01-02 LAB — LIPID PANEL
CHOLESTEROL: 179 mg/dL (ref 0–200)
HDL: 45.5 mg/dL (ref >39.00)
LDL Cholesterol: 98 mg/dL (ref 0–99)
TRIGLYCERIDES: 176 mg/dL — AB (ref 0.0–149.0)
Total CHOL/HDL Ratio: 4
VLDL: 35.2 mg/dL (ref 0.0–40.0)

## 2014-01-02 LAB — HEPATIC FUNCTION PANEL
ALBUMIN: 4 g/dL (ref 3.5–5.2)
ALT: 28 U/L (ref 0–53)
AST: 25 U/L (ref 0–37)
Alkaline Phosphatase: 49 U/L (ref 39–117)
Bilirubin, Direct: 0.1 mg/dL (ref 0.0–0.3)
Total Bilirubin: 0.5 mg/dL (ref 0.2–1.2)
Total Protein: 6.8 g/dL (ref 6.0–8.3)

## 2014-01-02 LAB — PSA: PSA: 0.81 ng/mL (ref 0.10–4.00)

## 2014-01-02 LAB — TSH: TSH: 1.12 u[IU]/mL (ref 0.35–5.50)

## 2014-01-03 ENCOUNTER — Encounter: Payer: Self-pay | Admitting: Internal Medicine

## 2014-01-03 ENCOUNTER — Ambulatory Visit (INDEPENDENT_AMBULATORY_CARE_PROVIDER_SITE_OTHER): Payer: Managed Care, Other (non HMO) | Admitting: Internal Medicine

## 2014-01-03 VITALS — BP 118/84 | HR 87 | Temp 98.4°F | Ht 67.0 in | Wt 286.2 lb

## 2014-01-03 DIAGNOSIS — N183 Chronic kidney disease, stage 3 unspecified: Secondary | ICD-10-CM | POA: Insufficient documentation

## 2014-01-03 DIAGNOSIS — Z0000 Encounter for general adult medical examination without abnormal findings: Secondary | ICD-10-CM

## 2014-01-03 MED ORDER — OXYCODONE-ACETAMINOPHEN 5-325 MG PO TABS: 1.0000 | ORAL_TABLET | Freq: Every day | ORAL | Status: DC | PRN

## 2014-01-03 NOTE — Patient Instructions (Signed)
Please continue all other medications as before, and refills have been done if requested - the oxycodone  Please have the pharmacy call with any other refills you may need.  Please continue your efforts at being more active, low cholesterol diet, and weight control.  You are otherwise up to date with prevention measures today.  Your blood work was OK today  Please remember to sign up for MyChart if you have not done so, as this will be important to you in the future with finding out test results, communicating by private email, and scheduling acute appointments online when needed.  Please return in 6 months, or sooner if needed, with Lab testing done 3-5 days before

## 2014-01-03 NOTE — Assessment & Plan Note (Signed)
Stable, for f/u next visit

## 2014-01-03 NOTE — Progress Notes (Signed)
Pre visit review using our clinic review tool, if applicable. No additional management support is needed unless otherwise documented below in the visit note. 

## 2014-01-03 NOTE — Assessment & Plan Note (Signed)

## 2014-01-03 NOTE — Progress Notes (Signed)
Subjective:    Patient ID: Nash ShearerJohn Orlich, male    DOB: 02-26-1966, 48 y.o.   MRN: [id number]  HPI  Here for wellness and f/u;  Overall doing ok;  Pt denies CP, worsening SOB, DOE, wheezing, orthopnea, PND, worsening LE edema, palpitations, dizziness or syncope.  Pt denies neurological change such as new headache, facial or extremity weakness.  Pt denies polydipsia, polyuria, or low sugar symptoms. Pt states overall good compliance with treatment and medications, good tolerability, and has been trying to follow lower cholesterol diet.  Pt denies worsening depressive symptoms, suicidal ideation or panic. No fever, night sweats, wt loss, loss of appetite, or other constitutional symptoms.  Pt states good ability with ADL's, has low fall risk, home safety reviewed and adequate, no other significant changes in hearing or vision, and only occasionally active with exercise. Pt continues to have recurring LBP without change in severity, bowel or bladder change, fever, wt loss,  worsening LE pain/numbness/weakness, gait change or falls. Past Medical History  Diagnosis Date  . ALLERGIC RHINITIS 07/13/2007  . ANXIETY 05/28/2007  . BACK PAIN, CHRONIC, HX OF 05/28/2007  . DEPRESSION 05/28/2007  . DIZZINESS 07/13/2007  . FATIGUE 07/13/2009  . HAY FEVER 05/28/2007  . HSV 01/11/2008  . HYPERLIPIDEMIA 10/09/2009  . HYPERTENSION 05/28/2007  . HYPOGONADISM 04/20/2009  . HYPOPITUITARISM 05/15/2009  . LIBIDO, DECREASED 04/11/2009  . ORAL ULCER 01/11/2008  . PERIPHERAL EDEMA 02/08/2009  . SKIN LESION 04/12/2008  . SLEEP APNEA, OBSTRUCTIVE 05/29/2007  . URINARY TRACT INFECTION, HX OF 05/28/2007  . Chronic lower back pain 10/04/2013   Past Surgical History  Procedure Laterality Date  . Appendectomy  2003  . Shoulder surgery  01/21/2012    retator cuff RT  . Breath tek h pylori  04/27/2012    Procedure: BREATH TEK H PYLORI;  Surgeon: Valarie MerinoMatthew B Martin, MD;  [facility];  Service: General;  Laterality: N/A;    reports that he has never smoked. He has never used smokeless tobacco. He reports that he drinks alcohol. He reports that he does not use illicit drugs. family history includes Cancer in his father; Coronary artery disease in his father; Diabetes in his other; Hyperlipidemia in his other; Hypertension in his other; Stroke in his other. No Known Allergies Current Outpatient Prescriptions on File Prior to Visit  Medication Sig Dispense Refill  . amLODipine (NORVASC) 5 MG tablet Take 1 tablet (5 mg total) by mouth daily.  90 tablet  3  . benazepril (LOTENSIN) 40 MG tablet TAKE ONE TABLET BY MOUTH EVERY DAY  90 tablet  3  . DULoxetine (CYMBALTA) 60 MG capsule Take 1 capsule (60 mg total) by mouth daily.  90 capsule  3  . hydrochlorothiazide (HYDRODIURIL) 25 MG tablet TAKE ONE-HALF TABLET BY MOUTH EVERY DAY  90 tablet  3  . KLOR-CON M10 10 MEQ tablet TAKE ONE TABLET BY MOUTH EVERY DAY  90 tablet  3  . LORazepam (ATIVAN) 2 MG tablet Take 0.5 tablets (1 mg total) by mouth 2 (two) times daily as needed for anxiety. Take 1/2 by mouth two times a day as needed  60 tablet  2  . Multiple Vitamin (MULTIVITAMIN) tablet Take 1 tablet by mouth daily.        . Omega 3-6-9 Fatty Acids (OMEGA 3-6-9 COMPLEX PO) Take by mouth daily.        . pravastatin (PRAVACHOL) 40 MG tablet TAKE ONE TABLET BY MOUTH ONCE DAILY  90 tablet  3  . [  DISCONTINUED] potassium chloride (KLOR-CON 10) 10 MEQ CR tablet Take 1 tablet (10 mEq total) by mouth daily.  90 tablet  3   No current facility-administered medications on file prior to visit.   Review of Systems Constitutional: Negative for increased diaphoresis, other activity, appetite or other siginficant weight change  HENT: Negative for worsening hearing loss, ear pain, facial swelling, mouth sores and neck stiffness.   Eyes: Negative for other worsening pain, redness or visual disturbance.  Respiratory: Negative for shortness of breath and wheezing.   Cardiovascular: Negative for  chest pain and palpitations.  Gastrointestinal: Negative for diarrhea, blood in stool, abdominal distention or other pain Genitourinary: Negative for hematuria, flank pain or change in urine volume.  Musculoskeletal: Negative for myalgias or other joint complaints.  Skin: Negative for color change and wound.  Neurological: Negative for syncope and numbness. other than noted Hematological: Negative for adenopathy. or other swelling Psychiatric/Behavioral: Negative for hallucinations, self-injury, decreased concentration or other worsening agitation.      Objective:   Physical Exam BP 118/84  Pulse 87  Temp(Src) 98.4 F (36.9 C) (Oral)  Ht 5\' 7"  (1.702 m)  Wt 286 lb 4 oz (129.842 kg)  BMI 44.82 kg/m2  SpO2 95% VS noted,  Constitutional: Pt is oriented to person, place, and time. Appears well-developed and well-nourished.  Head: Normocephalic and atraumatic.  Right Ear: External ear normal.  Left Ear: External ear normal.  Nose: Nose normal.  Mouth/Throat: Oropharynx is clear and moist.  Eyes: Conjunctivae and EOM are normal. Pupils are equal, round, and reactive to light.  Neck: Normal range of motion. Neck supple. No JVD present. No tracheal deviation present.  Cardiovascular: Normal rate, regular rhythm, normal heart sounds and intact distal pulses.   Pulmonary/Chest: Effort normal and breath sounds without rales or wheezing  Abdominal: Soft. Bowel sounds are normal. NT. No HSM  Musculoskeletal: Normal range of motion. Exhibits no edema.  Lymphadenopathy:  Has no cervical adenopathy.  Neurological: Pt is alert and oriented to person, place, and time. Pt has normal reflexes. No cranial nerve deficit. Motor grossly intact Skin: Skin is warm and dry. No rash noted.  Psychiatric:  Has normal mood and affect. Behavior is normal.     Assessment & Plan:

## 2014-03-20 ENCOUNTER — Telehealth: Payer: Self-pay | Admitting: Pulmonary Disease

## 2014-03-20 NOTE — Telephone Encounter (Signed)
mindy please advise if you have received the paper work from [facility] for the pts  cpap supplies.  thanks

## 2014-03-21 NOTE — Telephone Encounter (Signed)
I have not received anything. This sounds like a CMN. Please advise alida thanks

## 2014-03-21 NOTE — Telephone Encounter (Signed)
This cmn is in Dr Annie MainAlva blue folder to be signed, I called Misty StanleyStacey @ [facility] and informed Dr Vassie LollAlva not in office today, once signed I will fax. Kandice Hams.Elisavet Buehrer

## 2014-04-23 ENCOUNTER — Other Ambulatory Visit: Payer: Self-pay | Admitting: Internal Medicine

## 2014-05-14 ENCOUNTER — Other Ambulatory Visit: Payer: Self-pay | Admitting: Internal Medicine

## 2014-05-17 ENCOUNTER — Telehealth: Payer: Self-pay | Admitting: Internal Medicine

## 2014-05-17 MED ORDER — DULOXETINE HCL 60 MG PO CPEP: 60.0000 mg | ORAL_CAPSULE | Freq: Every day | ORAL | Status: DC

## 2014-05-17 NOTE — Telephone Encounter (Signed)
Printed script and faxed to [facility] at [phone]

## 2014-05-17 NOTE — Telephone Encounter (Signed)
[name] spouse came by requesting refill on GENERIC Cymbalta.  She is requesting a year's supply of  GENERIC Cymbalta.  Refill [facility] fax [phone].  Phone # is [phone].

## 2014-05-19 DIAGNOSIS — Z0279 Encounter for issue of other medical certificate: Secondary | ICD-10-CM

## 2014-06-30 ENCOUNTER — Other Ambulatory Visit (INDEPENDENT_AMBULATORY_CARE_PROVIDER_SITE_OTHER): Payer: Managed Care, Other (non HMO)

## 2014-06-30 DIAGNOSIS — N183 Chronic kidney disease, stage 3 unspecified: Secondary | ICD-10-CM

## 2014-06-30 DIAGNOSIS — E785 Hyperlipidemia, unspecified: Secondary | ICD-10-CM

## 2014-06-30 LAB — LIPID PANEL
CHOLESTEROL: 329 mg/dL — AB (ref 0–200)
HDL: 22.6 mg/dL — ABNORMAL LOW (ref >39.00)
NonHDL: 306.4
Total CHOL/HDL Ratio: 15
Triglycerides: 313 mg/dL — ABNORMAL HIGH (ref 0.0–149.0)
VLDL: 62.6 mg/dL — AB (ref 0.0–40.0)

## 2014-06-30 LAB — BASIC METABOLIC PANEL
BUN: 17 mg/dL (ref 6–23)
CO2: 28 mEq/L (ref 19–32)
Calcium: 9.2 mg/dL (ref 8.4–10.5)
Chloride: 94 mEq/L — ABNORMAL LOW (ref 96–112)
Creatinine, Ser: 1.9 mg/dL — ABNORMAL HIGH (ref 0.4–1.5)
GFR: 40.04 mL/min — AB (ref >60.00)
GLUCOSE: 80 mg/dL (ref 70–99)
POTASSIUM: 3.7 meq/L (ref 3.5–5.1)
Sodium: 133 mEq/L — ABNORMAL LOW (ref 135–145)

## 2014-06-30 LAB — HEPATIC FUNCTION PANEL
ALBUMIN: 3.6 g/dL (ref 3.5–5.2)
ALK PHOS: 39 U/L (ref 39–117)
ALT: 80 U/L — ABNORMAL HIGH (ref 0–53)
AST: 46 U/L — ABNORMAL HIGH (ref 0–37)
BILIRUBIN TOTAL: 1 mg/dL (ref 0.2–1.2)
Bilirubin, Direct: 0.1 mg/dL (ref 0.0–0.3)
Total Protein: 7.8 g/dL (ref 6.0–8.3)

## 2014-06-30 LAB — LDL CHOLESTEROL, DIRECT: Direct LDL: 249.2 mg/dL

## 2014-07-04 ENCOUNTER — Ambulatory Visit (INDEPENDENT_AMBULATORY_CARE_PROVIDER_SITE_OTHER): Payer: Managed Care, Other (non HMO) | Admitting: Internal Medicine

## 2014-07-04 ENCOUNTER — Encounter: Payer: Self-pay | Admitting: Internal Medicine

## 2014-07-04 VITALS — BP 120/80 | HR 74 | Temp 98.5°F | Ht 67.0 in | Wt 265.1 lb

## 2014-07-04 DIAGNOSIS — N183 Chronic kidney disease, stage 3 unspecified: Secondary | ICD-10-CM

## 2014-07-04 DIAGNOSIS — Z0000 Encounter for general adult medical examination without abnormal findings: Secondary | ICD-10-CM

## 2014-07-04 DIAGNOSIS — G8929 Other chronic pain: Secondary | ICD-10-CM

## 2014-07-04 DIAGNOSIS — M545 Low back pain: Secondary | ICD-10-CM

## 2014-07-04 DIAGNOSIS — Z0189 Encounter for other specified special examinations: Secondary | ICD-10-CM

## 2014-07-04 DIAGNOSIS — I10 Essential (primary) hypertension: Secondary | ICD-10-CM

## 2014-07-04 DIAGNOSIS — E785 Hyperlipidemia, unspecified: Secondary | ICD-10-CM

## 2014-07-04 MED ORDER — OXYCODONE-ACETAMINOPHEN 5-325 MG PO TABS: 1.0000 | ORAL_TABLET | Freq: Every day | ORAL | Status: DC | PRN

## 2014-07-04 NOTE — Progress Notes (Signed)
Pre visit review using our clinic review tool, if applicable. No additional management support is needed unless otherwise documented below in the visit note. 

## 2014-07-04 NOTE — Assessment & Plan Note (Signed)
stable overall by history and exam, recent data reviewed with pt, and pt to continue medical treatment as before,  to f/u any worsening symptoms or concerns BP Readings from Last 3 Encounters:  07/04/14 120/80  01/03/14 118/84  11/22/13 132/82

## 2014-07-04 NOTE — Patient Instructions (Signed)
Please continue all other medications as before, and refills have been done if requested- the oxycodone  Please have the pharmacy call with any other refills you may need.  Please continue your efforts at being more active, low cholesterol diet, and weight control.  Please keep your appointments with your specialists as you may have planned  Please return in 2 -4 wks for repeat blood tests  You will be contacted by phone if any changes need to be made immediately.  Otherwise, you will receive a letter about your results with an explanation, but please check with MyChart first.  Please remember to sign up for MyChart if you have not done so, as this will be important to you in the future with finding out test results, communicating by private email, and scheduling acute appointments online when needed.  Please return in 6 months, or sooner if needed, with Lab testing done 3-5 days before

## 2014-07-04 NOTE — Assessment & Plan Note (Signed)
Severe worsening LDL off statin for > 2 wks, pt wanted to try diet alone, to re-start statin. Lower chol diet, f/u lab in 2-4 wks  Lab Results  Component Value Date   LDLCALC 98 01/02/2014

## 2014-07-04 NOTE — Assessment & Plan Note (Signed)
With mild worsening for unclear reason, pt declines eval for now, but will accept repeat lab 4 wks

## 2014-07-04 NOTE — Assessment & Plan Note (Signed)
Stable, for repeat oxycodone rx,  to f/u any worsening symptoms or concerns

## 2014-07-04 NOTE — Progress Notes (Signed)
Subjective:    Patient ID: Darren ShearerJohn Black, male    DOB: 03-12-1966, 48 y.o.   MRN: [id number]  HPI  Here to f/u; overall doing ok,  Pt denies chest pain, increased sob or doe, wheezing, orthopnea, PND, increased LE swelling, palpitations, dizziness or syncope.  Pt denies polydipsia, polyuria, or low sugar symptoms such as weakness or confusion improved with po intake.  Pt denies new neurological symptoms such as new headache, or facial or extremity weakness or numbness.   Pt states overall good compliance with meds, has been trying to follow lower cholesterol diet, with wt down 10 lbs intentionally,  but little exercise however due to ongoing chronic back pain.  Uses oxycodone infreq. Currently disabled.  Drinks plenty of fluids during the day. Overall good compliance with treatment, and good medicine tolerability. Past Medical History  Diagnosis Date  . ALLERGIC RHINITIS 07/13/2007  . ANXIETY 05/28/2007  . BACK PAIN, CHRONIC, HX OF 05/28/2007  . DEPRESSION 05/28/2007  . DIZZINESS 07/13/2007  . FATIGUE 07/13/2009  . HAY FEVER 05/28/2007  . HSV 01/11/2008  . HYPERLIPIDEMIA 10/09/2009  . HYPERTENSION 05/28/2007  . HYPOGONADISM 04/20/2009  . HYPOPITUITARISM 05/15/2009  . LIBIDO, DECREASED 04/11/2009  . ORAL ULCER 01/11/2008  . PERIPHERAL EDEMA 02/08/2009  . SKIN LESION 04/12/2008  . SLEEP APNEA, OBSTRUCTIVE 05/29/2007  . URINARY TRACT INFECTION, HX OF 05/28/2007  . Chronic lower back pain 10/04/2013   Past Surgical History  Procedure Laterality Date  . Appendectomy  2003  . Shoulder surgery  01/21/2012    retator cuff RT  . Breath tek h pylori  04/27/2012    Procedure: BREATH TEK H PYLORI;  Surgeon: Valarie MerinoMatthew B Martin, MD;  Location: [facility];  Service: General;  Laterality: N/A;    reports that he has never smoked. He has never used smokeless tobacco. He reports that he drinks alcohol. He reports that he does not use illicit drugs. family history includes Cancer in his father; Coronary artery  disease in his father; Diabetes in his other; Hyperlipidemia in his other; Hypertension in his other; Stroke in his other. No Known Allergies Current Outpatient Prescriptions on File Prior to Visit  Medication Sig Dispense Refill  . amLODipine (NORVASC) 5 MG tablet Take 1 tablet (5 mg total) by mouth daily. 90 tablet 3  . benazepril (LOTENSIN) 40 MG tablet TAKE ONE TABLET BY MOUTH ONCE DAILY 90 tablet 3  . DULoxetine (CYMBALTA) 60 MG capsule Take 1 capsule (60 mg total) by mouth daily. 90 capsule 3  . hydrochlorothiazide (HYDRODIURIL) 25 MG tablet TAKE ONE-HALF TABLET BY MOUTH EVERY DAY 90 tablet 3  . KLOR-CON M20 20 MEQ tablet TAKE ONE-HALF TABLET BY MOUTH ONCE DAILY 45 tablet 1  . LORazepam (ATIVAN) 2 MG tablet Take 0.5 tablets (1 mg total) by mouth 2 (two) times daily as needed for anxiety. Take 1/2 by mouth two times a day as needed 60 tablet 2  . Multiple Vitamin (MULTIVITAMIN) tablet Take 1 tablet by mouth daily.      . Omega 3-6-9 Fatty Acids (OMEGA 3-6-9 COMPLEX PO) Take by mouth daily.      . pravastatin (PRAVACHOL) 40 MG tablet TAKE ONE TABLET BY MOUTH ONCE DAILY 90 tablet 3  . [DISCONTINUED] potassium chloride (KLOR-CON 10) 10 MEQ CR tablet Take 1 tablet (10 mEq total) by mouth daily. 90 tablet 3   No current facility-administered medications on file prior to visit.   Review of Systems  Constitutional: Negative for unusual diaphoresis or other sweats  HENT: Negative for ringing in ear Eyes: Negative for double vision or worsening visual disturbance.  Respiratory: Negative for choking and stridor.   Gastrointestinal: Negative for vomiting or other signifcant bowel change Genitourinary: Negative for hematuria or decreased urine volume.  Musculoskeletal: Negative for other MSK pain or swelling Skin: Negative for color change and worsening wound.  Neurological: Negative for tremors and numbness other than noted  Psychiatric/Behavioral: Negative for decreased concentration or  agitation other than above       Objective:   Physical Exam BP 120/80 mmHg  Pulse 74  Temp(Src) 98.5 F (36.9 C) (Oral)  Ht 5\' 7"  (1.702 m)  Wt 265 lb 2 oz (120.26 kg)  BMI 41.51 kg/m2  SpO2 95% VS noted,  Constitutional: Pt appears well-developed, well-nourished.  HENT: Head: NCAT.  Right Ear: External ear normal.  Left Ear: External ear normal.  Eyes: . Pupils are equal, round, and reactive to light. Conjunctivae and EOM are normal Neck: Normal range of motion. Neck supple.  Cardiovascular: Normal rate and regular rhythm.   Pulmonary/Chest: Effort normal and breath sounds normal.  Abd:  Soft, NT, ND, + BS Spine lumbar mild diffuse chronic tender Neurological: Pt is alert. Not confused , motor grossly intact Skin: Skin is warm. No rash Psychiatric: Pt behavior is normal. No agitation.     Assessment & Plan:

## 2014-07-05 ENCOUNTER — Telehealth: Payer: Self-pay | Admitting: Internal Medicine

## 2014-07-05 NOTE — Telephone Encounter (Signed)
emmi emailed °

## 2014-07-23 ENCOUNTER — Other Ambulatory Visit: Payer: Self-pay | Admitting: Internal Medicine

## 2014-08-21 ENCOUNTER — Other Ambulatory Visit (INDEPENDENT_AMBULATORY_CARE_PROVIDER_SITE_OTHER): Payer: Managed Care, Other (non HMO)

## 2014-08-21 DIAGNOSIS — E785 Hyperlipidemia, unspecified: Secondary | ICD-10-CM

## 2014-08-21 LAB — BASIC METABOLIC PANEL
BUN: 17 mg/dL (ref 6–23)
CO2: 30 mEq/L (ref 19–32)
CREATININE: 1.4 mg/dL (ref 0.4–1.5)
Calcium: 8.7 mg/dL (ref 8.4–10.5)
Chloride: 100 mEq/L (ref 96–112)
GFR: 58.58 mL/min — AB (ref >60.00)
Glucose, Bld: 90 mg/dL (ref 70–99)
Potassium: 3.5 mEq/L (ref 3.5–5.1)
Sodium: 139 mEq/L (ref 135–145)

## 2014-08-21 LAB — LIPID PANEL
CHOL/HDL RATIO: 5
CHOLESTEROL: 190 mg/dL (ref 0–200)
HDL: 40.5 mg/dL (ref >39.00)
NonHDL: 149.5
Triglycerides: 213 mg/dL — ABNORMAL HIGH (ref 0.0–149.0)
VLDL: 42.6 mg/dL — AB (ref 0.0–40.0)

## 2014-08-21 LAB — LDL CHOLESTEROL, DIRECT: LDL DIRECT: 112.3 mg/dL

## 2014-09-12 ENCOUNTER — Encounter: Payer: Self-pay | Admitting: Internal Medicine

## 2014-09-12 ENCOUNTER — Ambulatory Visit (INDEPENDENT_AMBULATORY_CARE_PROVIDER_SITE_OTHER): Payer: Managed Care, Other (non HMO) | Admitting: Internal Medicine

## 2014-09-12 VITALS — BP 130/80 | HR 80 | Temp 98.5°F | Ht 67.0 in | Wt 280.0 lb

## 2014-09-12 DIAGNOSIS — J209 Acute bronchitis, unspecified: Secondary | ICD-10-CM

## 2014-09-12 DIAGNOSIS — I10 Essential (primary) hypertension: Secondary | ICD-10-CM

## 2014-09-12 MED ORDER — HYDROCODONE-HOMATROPINE 5-1.5 MG/5ML PO SYRP: 5.0000 mL | ORAL_SOLUTION | Freq: Four times a day (QID) | ORAL | Status: DC | PRN

## 2014-09-12 MED ORDER — OXYCODONE-ACETAMINOPHEN 5-325 MG PO TABS: 1.0000 | ORAL_TABLET | Freq: Every day | ORAL | Status: DC | PRN

## 2014-09-12 MED ORDER — CEPHALEXIN 500 MG PO CAPS: 500.0000 mg | ORAL_CAPSULE | Freq: Four times a day (QID) | ORAL | Status: DC

## 2014-09-12 NOTE — Progress Notes (Signed)
Pre visit review using our clinic review tool, if applicable. No additional management support is needed unless otherwise documented below in the visit note. 

## 2014-09-12 NOTE — Patient Instructions (Signed)
Please take all new medication as prescribed - the antibiotic  You can also take Delsym OTC for cough, and/or Mucinex (or it's generic off brand) for congestion, and tylenol as needed for pain.  Please continue all other medications as before, and refills have been done if requested - the percocet  Please have the pharmacy call with any other refills you may need.  Please keep your appointments with your specialists as you may have planned

## 2014-09-12 NOTE — Progress Notes (Signed)
Subjective:    Patient ID: Darren Black, male    DOB: March 24, 1966, 49 y.o.   MRN: [id number]  HPI  Here with acute onset mild to mod 2-3 days ST, HA, general weakness and malaise, with prod cough greenish sputum, but Pt denies chest pain, increased sob or doe, wheezing, orthopnea, PND, increased LE swelling, palpitations, dizziness or syncope.  Pt denies new neurological symptoms such as new headache, or facial or extremity weakness or numbness   Pt denies polydipsia, polyuria,   Pt states overall good compliance with meds.   Past Medical History  Diagnosis Date  . ALLERGIC RHINITIS 07/13/2007  . ANXIETY 05/28/2007  . BACK PAIN, CHRONIC, HX OF 05/28/2007  . DEPRESSION 05/28/2007  . DIZZINESS 07/13/2007  . FATIGUE 07/13/2009  . HAY FEVER 05/28/2007  . HSV 01/11/2008  . HYPERLIPIDEMIA 10/09/2009  . HYPERTENSION 05/28/2007  . HYPOGONADISM 04/20/2009  . HYPOPITUITARISM 05/15/2009  . LIBIDO, DECREASED 04/11/2009  . ORAL ULCER 01/11/2008  . PERIPHERAL EDEMA 02/08/2009  . SKIN LESION 04/12/2008  . SLEEP APNEA, OBSTRUCTIVE 05/29/2007  . URINARY TRACT INFECTION, HX OF 05/28/2007  . Chronic lower back pain 10/04/2013   Past Surgical History  Procedure Laterality Date  . Appendectomy  2003  . Shoulder surgery  01/21/2012    retator cuff RT  . Breath tek h pylori  04/27/2012    Procedure: BREATH TEK H PYLORI;  Surgeon: Valarie MerinoMatthew B Martin, MD;  Location: [facility];  Service: General;  Laterality: N/A;    reports that he has never smoked. He has never used smokeless tobacco. He reports that he drinks alcohol. He reports that he does not use illicit drugs. family history includes Cancer in his father; Coronary artery disease in his father; Diabetes in his other; Hyperlipidemia in his other; Hypertension in his other; Stroke in his other. No Known Allergies Current Outpatient Prescriptions on File Prior to Visit  Medication Sig Dispense Refill  . amLODipine (NORVASC) 5 MG tablet Take 1 tablet (5 mg total) by  mouth daily. 90 tablet 3  . benazepril (LOTENSIN) 40 MG tablet TAKE ONE TABLET BY MOUTH ONCE DAILY 90 tablet 3  . DULoxetine (CYMBALTA) 60 MG capsule Take 1 capsule (60 mg total) by mouth daily. 90 capsule 3  . hydrochlorothiazide (HYDRODIURIL) 25 MG tablet TAKE ONE-HALF TABLET BY MOUTH ONCE DAILY 90 tablet 1  . KLOR-CON M20 20 MEQ tablet TAKE ONE-HALF TABLET BY MOUTH ONCE DAILY 45 tablet 1  . LORazepam (ATIVAN) 2 MG tablet Take 0.5 tablets (1 mg total) by mouth 2 (two) times daily as needed for anxiety. Take 1/2 by mouth two times a day as needed 60 tablet 2  . Multiple Vitamin (MULTIVITAMIN) tablet Take 1 tablet by mouth daily.      . Omega 3-6-9 Fatty Acids (OMEGA 3-6-9 COMPLEX PO) Take by mouth daily.      . pravastatin (PRAVACHOL) 40 MG tablet TAKE ONE TABLET BY MOUTH ONCE DAILY 90 tablet 3  . [DISCONTINUED] potassium chloride (KLOR-CON 10) 10 MEQ CR tablet Take 1 tablet (10 mEq total) by mouth daily. 90 tablet 3   No current facility-administered medications on file prior to visit.   Review of Systems  All otherwise neg per pt      Objective:   Physical Exam BP 130/80 mmHg  Pulse 80  Temp(Src) 98.5 F (36.9 C) (Oral)  Ht 5\' 7"  (1.702 m)  Wt 280 lb (127.007 kg)  BMI 43.84 kg/m2  SpO2 96% VS noted,  Constitutional: Pt  appears well-developed, well-nourished.  HENT: Head: NCAT.  Right Ear: External ear normal.  Left Ear: External ear normal.  Eyes: . Pupils are equal, round, and reactive to light. Conjunctivae and EOM are normal Neck: Normal range of motion. Neck supple.  Cardiovascular: Normal rate and regular rhythm.   Pulmonary/Chest: Effort normal and breath sounds without wheezing, but few faint RLL crackles noted.  Neurological: Pt is alert. Not confused , motor grossly intact Skin: Skin is warm. No rash Psychiatric: Pt behavior is normal. No agitation.     Assessment & Plan:

## 2014-09-22 DIAGNOSIS — J209 Acute bronchitis, unspecified: Secondary | ICD-10-CM | POA: Insufficient documentation

## 2014-09-22 NOTE — Assessment & Plan Note (Addendum)
Mild to mod, declines cxr, for antibx course,  to f/u any worsening symptoms or concerns

## 2014-09-22 NOTE — Assessment & Plan Note (Signed)
stable overall by history and exam, recent data reviewed with pt, and pt to continue medical treatment as before,  to f/u any worsening symptoms or concerns BP Readings from Last 3 Encounters:  09/12/14 130/80  07/04/14 120/80  01/03/14 118/84

## 2014-09-27 ENCOUNTER — Encounter: Payer: Self-pay | Admitting: Internal Medicine

## 2014-09-27 MED ORDER — HYDROCODONE-HOMATROPINE 5-1.5 MG/5ML PO SYRP: 5.0000 mL | ORAL_SOLUTION | Freq: Four times a day (QID) | ORAL | Status: DC | PRN

## 2014-09-27 NOTE — Telephone Encounter (Signed)
Done hardcopy to robin  

## 2014-10-01 ENCOUNTER — Other Ambulatory Visit: Payer: Self-pay | Admitting: Internal Medicine

## 2014-11-13 ENCOUNTER — Other Ambulatory Visit: Payer: Self-pay | Admitting: Internal Medicine

## 2014-11-14 NOTE — Telephone Encounter (Signed)
I do not see Amlodipine on patient's medication list. Should patient be on this?

## 2014-11-15 ENCOUNTER — Encounter: Payer: Self-pay | Admitting: Internal Medicine

## 2014-11-15 MED ORDER — OXYCODONE-ACETAMINOPHEN 5-325 MG PO TABS: 1.0000 | ORAL_TABLET | Freq: Every day | ORAL | Status: DC | PRN

## 2014-11-15 NOTE — Telephone Encounter (Signed)
Sent refill on K.../lmb

## 2014-11-15 NOTE — Telephone Encounter (Signed)
K is ok , but norvasc is not needed  I dont how to do this since both are on the same request  To cherina to help please

## 2014-11-15 NOTE — Telephone Encounter (Signed)
Done hardcopy to Cherina  

## 2014-11-16 ENCOUNTER — Other Ambulatory Visit: Payer: Self-pay | Admitting: Internal Medicine

## 2014-11-16 ENCOUNTER — Encounter: Payer: Self-pay | Admitting: Internal Medicine

## 2014-11-16 NOTE — Telephone Encounter (Signed)
Pt.notified

## 2014-11-16 NOTE — Telephone Encounter (Signed)
Percocet rx too soon, as it was just done yesterday

## 2014-12-05 ENCOUNTER — Encounter: Payer: Self-pay | Admitting: Adult Health

## 2014-12-05 ENCOUNTER — Ambulatory Visit (INDEPENDENT_AMBULATORY_CARE_PROVIDER_SITE_OTHER): Payer: Medicare HMO | Admitting: Adult Health

## 2014-12-05 ENCOUNTER — Telehealth: Payer: Self-pay | Admitting: Adult Health

## 2014-12-05 VITALS — BP 120/88 | HR 80 | Temp 98.1°F | Ht 67.0 in | Wt 297.2 lb

## 2014-12-05 DIAGNOSIS — G4733 Obstructive sleep apnea (adult) (pediatric): Secondary | ICD-10-CM

## 2014-12-05 NOTE — Telephone Encounter (Signed)
Download results received Per TP: great usage.  Continue current regimen.  Download sent for scan Nothing further needed; will sign off

## 2014-12-05 NOTE — Progress Notes (Signed)
   Subjective:    Patient ID: Darren ShearerJohn Black, male    DOB: 1965-12-26, 49 y.o.   MRN: [id number]  HPI  47/M , originally from from [place] for FU of severe obstructive sleep apnea .  PSG in 2008 showed AHI 29/h, wt was 252 lbs, CPAP was initiated at 12 cm based on autotitration data, then lowered to 10 cm    [facility] , since it is cheaper for him to obtain supplies.  Download 1/30-2/13/13     11/22/2013  Annual FU Gained 272 --> 281  Got new CPAP - download on 10 cm 10/2013 - good usage 9h, AHI 2/h, leak ok Sleeping well , Wife has not noted snoring on current settings.  Wakes up rested, no leak, mask ok, pressure ok, no daytime somnolence.  On 3 meds for BP  Takes ativan 3-4 times/ wk for anxiety    12/05/2014 OSA follow up  Returns for 1 year follow up for sleep apnea Says he is doing well.  Wears every night 9-10 hrs . "cant live without it".  Wakes up rested. No daytime sleepiness.  Loves his nasal mask.  DME company good.  Continues to struggle with weight loss. Wt tr up 281>297  Encouraged on diet and wt loss.  No chest pain, orthopnea, edema .  Download requested.  No chest pain, orthopnea,edema or fever.    Review of Systems  Constitutional:   No  weight loss, night sweats,  Fevers, chills, fatigue, or  lassitude.  HEENT:   No headaches,  Difficulty swallowing,  Tooth/dental problems, or  Sore throat,                No sneezing, itching, ear ache, nasal congestion, post nasal drip,   CV:  No chest pain,  Orthopnea, PND, swelling in lower extremities, anasarca, dizziness, palpitations, syncope.   GI  No heartburn, indigestion, abdominal pain, nausea, vomiting, diarrhea, change in bowel habits, loss of appetite, bloody stools.   Resp: No shortness of breath with exertion or at rest.  No excess mucus, no productive cough,  No non-productive cough,  No coughing up of blood.  No change in color of mucus.  No wheezing.  No chest wall  deformity  Skin: no rash or lesions.  GU: no dysuria, change in color of urine, no urgency or frequency.  No flank pain, no hematuria            Objective:   Physical Exam GEN: A/Ox3; pleasant , NAD, morbidly obese   HEENT:  [place]/AT,  EACs-clear, TMs-wnl, NOSE-clear, THROAT-clear, no lesions, no postnasal drip or exudate noted. Class 2-3 airway   NECK:  Supple w/ fair ROM; no JVD; normal carotid impulses w/o bruits; no thyromegaly or nodules palpated; no lymphadenopathy.  RESP  Clear  P & A; w/o, wheezes/ rales/ or rhonchi.no accessory muscle use, no dullness to percussion  CARD:  RRR, no m/r/g  , no peripheral edema, pulses intact, no cyanosis or clubbing.  GI:   Soft & nt; nml bowel sounds; no organomegaly or masses detected.  Musco: Warm bil, no deformities or joint swelling noted.   Neuro: alert, no focal deficits noted.    Skin: Warm, no lesions or rashes         Assessment & Plan:

## 2014-12-05 NOTE — Progress Notes (Signed)
Reviewed & agree with plan  

## 2014-12-05 NOTE — Patient Instructions (Signed)
Continue on CPAP At bedtime   Weight loss encouraged follow up Dr. Vassie LollAlva  In 1 year and As needed

## 2014-12-05 NOTE — Assessment & Plan Note (Signed)
Doing well on CPAP  Download requested   Plan  Continue on CPAP At bedtime   Weight loss encouraged follow up Dr. Vassie LollAlva  In 1 year and As needed

## 2014-12-05 NOTE — Assessment & Plan Note (Signed)
Wt loss encouraged  

## 2015-01-03 ENCOUNTER — Other Ambulatory Visit (INDEPENDENT_AMBULATORY_CARE_PROVIDER_SITE_OTHER): Payer: Medicare HMO

## 2015-01-03 ENCOUNTER — Ambulatory Visit (INDEPENDENT_AMBULATORY_CARE_PROVIDER_SITE_OTHER): Payer: Medicare HMO | Admitting: Internal Medicine

## 2015-01-03 ENCOUNTER — Encounter: Payer: Self-pay | Admitting: Internal Medicine

## 2015-01-03 VITALS — BP 128/84 | HR 78 | Temp 98.4°F | Resp 20 | Ht 67.0 in | Wt 299.1 lb

## 2015-01-03 DIAGNOSIS — E785 Hyperlipidemia, unspecified: Secondary | ICD-10-CM | POA: Diagnosis not present

## 2015-01-03 DIAGNOSIS — Z0000 Encounter for general adult medical examination without abnormal findings: Secondary | ICD-10-CM

## 2015-01-03 DIAGNOSIS — I10 Essential (primary) hypertension: Secondary | ICD-10-CM

## 2015-01-03 DIAGNOSIS — R7989 Other specified abnormal findings of blood chemistry: Secondary | ICD-10-CM

## 2015-01-03 LAB — CBC WITH DIFFERENTIAL/PLATELET
BASOS PCT: 0.4 % (ref 0.0–3.0)
Basophils Absolute: 0 10*3/uL (ref 0.0–0.1)
EOS PCT: 2.8 % (ref 0.0–5.0)
Eosinophils Absolute: 0.2 10*3/uL (ref 0.0–0.7)
HCT: 43.7 % (ref 39.0–52.0)
HEMOGLOBIN: 15.1 g/dL (ref 13.0–17.0)
LYMPHS PCT: 19.5 % (ref 12.0–46.0)
Lymphs Abs: 1.4 10*3/uL (ref 0.7–4.0)
MCHC: 34.4 g/dL (ref 30.0–36.0)
MCV: 79.7 fl (ref 78.0–100.0)
Monocytes Absolute: 0.7 10*3/uL (ref 0.1–1.0)
Monocytes Relative: 8.8 % (ref 3.0–12.0)
Neutro Abs: 5.1 10*3/uL (ref 1.4–7.7)
Neutrophils Relative %: 68.5 % (ref 43.0–77.0)
Platelets: 273 10*3/uL (ref 150.0–400.0)
RBC: 5.49 Mil/uL (ref 4.22–5.81)
RDW: 13.8 % (ref 11.5–15.5)
WBC: 7.4 10*3/uL (ref 4.0–10.5)

## 2015-01-03 LAB — BASIC METABOLIC PANEL
BUN: 16 mg/dL (ref 6–23)
CHLORIDE: 100 meq/L (ref 96–112)
CO2: 30 mEq/L (ref 19–32)
CREATININE: 1.23 mg/dL (ref 0.40–1.50)
Calcium: 9.4 mg/dL (ref 8.4–10.5)
GFR: 66.8 mL/min (ref >60.00)
Glucose, Bld: 98 mg/dL (ref 70–99)
Potassium: 3.7 mEq/L (ref 3.5–5.1)
Sodium: 136 mEq/L (ref 135–145)

## 2015-01-03 LAB — URINALYSIS, ROUTINE W REFLEX MICROSCOPIC
Bilirubin Urine: NEGATIVE
HGB URINE DIPSTICK: NEGATIVE
Ketones, ur: NEGATIVE
LEUKOCYTES UA: NEGATIVE
Nitrite: NEGATIVE
PH: 6 (ref 5.0–8.0)
RBC / HPF: NONE SEEN (ref 0–?)
UROBILINOGEN UA: 0.2 (ref 0.0–1.0)
Urine Glucose: NEGATIVE
WBC, UA: NONE SEEN (ref 0–?)

## 2015-01-03 LAB — HEPATIC FUNCTION PANEL
ALBUMIN: 4.5 g/dL (ref 3.5–5.2)
ALK PHOS: 48 U/L (ref 39–117)
ALT: 35 U/L (ref 0–53)
AST: 27 U/L (ref 0–37)
BILIRUBIN DIRECT: 0.1 mg/dL (ref 0.0–0.3)
BILIRUBIN TOTAL: 0.5 mg/dL (ref 0.2–1.2)
Total Protein: 6.9 g/dL (ref 6.0–8.3)

## 2015-01-03 LAB — LDL CHOLESTEROL, DIRECT: LDL DIRECT: 131 mg/dL

## 2015-01-03 LAB — LIPID PANEL
CHOL/HDL RATIO: 4
Cholesterol: 225 mg/dL — ABNORMAL HIGH (ref 0–200)
HDL: 55.8 mg/dL (ref >39.00)
NONHDL: 169.2
Triglycerides: 228 mg/dL — ABNORMAL HIGH (ref 0.0–149.0)
VLDL: 45.6 mg/dL — ABNORMAL HIGH (ref 0.0–40.0)

## 2015-01-03 LAB — PSA: PSA: 0.77 ng/mL (ref 0.10–4.00)

## 2015-01-03 LAB — TSH: TSH: 2.28 u[IU]/mL (ref 0.35–4.50)

## 2015-01-03 MED ORDER — OXYCODONE-ACETAMINOPHEN 5-325 MG PO TABS: 1.0000 | ORAL_TABLET | Freq: Every day | ORAL | Status: DC | PRN

## 2015-01-03 MED ORDER — PHENTERMINE HCL 37.5 MG PO CAPS: 37.5000 mg | ORAL_CAPSULE | ORAL | Status: DC

## 2015-01-03 NOTE — Assessment & Plan Note (Signed)
Ok for limited rx phenetermine asd,  to f/u any worsening symptoms or concerns

## 2015-01-03 NOTE — Progress Notes (Signed)
Subjective:    Patient ID: Darren Black, male    DOB: [birth year], 49 y.o.   MRN: [id number]  HPI  Here for wellness and f/u;  Overall doing ok;  Pt denies Chest pain, worsening SOB, DOE, wheezing, orthopnea, PND, worsening LE edema, palpitations, dizziness or syncope.  Pt denies neurological change such as new headache, facial or extremity weakness.  Pt denies polydipsia, polyuria, or low sugar symptoms. Pt states overall good compliance with treatment and medications, good tolerability, and has been trying to follow appropriate diet.  Pt denies worsening depressive symptoms, suicidal ideation or panic. No fever, night sweats, wt loss, loss of appetite, or other constitutional symptoms.  Pt states good ability with ADL's, has low fall risk, home safety reviewed and adequate, no other significant changes in hearing or vision.  Goes to gym 5 days per wk, with persistent gain., but pain limits activity . Alleve helps somewhat Wt Readings from Last 3 Encounters:  01/03/15 299 lb 1.3 oz (135.662 kg)  12/05/14 297 lb 3.2 oz (134.809 kg)  09/12/14 280 lb (127.007 kg)  Pt continues to have recurring LBP without change in severity, bowel or bladder change, fever, wt loss,  worsening LE pain/numbness/weakness, gait change or falls.  Using CPAP at night.   Past Medical History  Diagnosis Date  . ALLERGIC RHINITIS 07/13/2007  . ANXIETY 05/28/2007  . BACK PAIN, CHRONIC, HX OF 05/28/2007  . DEPRESSION 05/28/2007  . DIZZINESS 07/13/2007  . FATIGUE 07/13/2009  . HAY FEVER 05/28/2007  . HSV 01/11/2008  . HYPERLIPIDEMIA 10/09/2009  . HYPERTENSION 05/28/2007  . HYPOGONADISM 04/20/2009  . HYPOPITUITARISM 05/15/2009  . LIBIDO, DECREASED 04/11/2009  . ORAL ULCER 01/11/2008  . PERIPHERAL EDEMA 02/08/2009  . SKIN LESION 04/12/2008  . SLEEP APNEA, OBSTRUCTIVE 05/29/2007  . URINARY TRACT INFECTION, HX OF 05/28/2007  . Chronic lower back pain 10/04/2013   Past Surgical History  Procedure Laterality Date  . Appendectomy   2003  . Shoulder surgery  01/21/2012    retator cuff RT  . Breath tek h pylori  04/27/2012    Procedure: BREATH TEK H PYLORI;  Surgeon: Valarie MerinoMatthew B Martin, MD;  Location: [facility];  Service: General;  Laterality: N/A;    reports that he has never smoked. He has never used smokeless tobacco. He reports that he drinks alcohol. He reports that he does not use illicit drugs. family history includes Cancer in his father; Coronary artery disease in his father; Diabetes in his other; Hyperlipidemia in his other; Hypertension in his other; Stroke in his other. No Known Allergies Current Outpatient Prescriptions on File Prior to Visit  Medication Sig Dispense Refill  . amLODipine (NORVASC) 5 MG tablet TAKE ONE TABLET BY MOUTH ONCE DAILY 90 tablet 0  . benazepril (LOTENSIN) 40 MG tablet TAKE ONE TABLET BY MOUTH ONCE DAILY 90 tablet 3  . DULoxetine (CYMBALTA) 60 MG capsule Take 1 capsule (60 mg total) by mouth daily. 90 capsule 3  . hydrochlorothiazide (HYDRODIURIL) 25 MG tablet TAKE ONE-HALF TABLET BY MOUTH ONCE DAILY 90 tablet 1  . KLOR-CON M20 20 MEQ tablet TAKE ONE-HALF TABLET BY MOUTH ONCE DAILY 45 tablet 0  . LORazepam (ATIVAN) 2 MG tablet Take 0.5 tablets (1 mg total) by mouth 2 (two) times daily as needed for anxiety. Take 1/2 by mouth two times a day as needed 60 tablet 2  . Multiple Vitamin (MULTIVITAMIN) tablet Take 1 tablet by mouth daily.      Ailene Ards. Omega 3-6-9 Fatty Acids (OMEGA  3-6-9 COMPLEX PO) Take by mouth daily.      Marland Kitchen. oxyCODONE-acetaminophen (PERCOCET/ROXICET) 5-325 MG per tablet Take 1 tablet by mouth daily as needed for severe pain. 30 tablet 0  . pravastatin (PRAVACHOL) 40 MG tablet TAKE ONE TABLET BY MOUTH ONCE DAILY 90 tablet 3  . [DISCONTINUED] potassium chloride (KLOR-CON 10) 10 MEQ CR tablet Take 1 tablet (10 mEq total) by mouth daily. 90 tablet 3   No current facility-administered medications on file prior to visit.   Review of Systems Constitutional: Negative for increased  diaphoresis, other activity, appetite or siginficant weight change other than noted HENT: Negative for worsening hearing loss, ear pain, facial swelling, mouth sores and neck stiffness.   Eyes: Negative for other worsening pain, redness or visual disturbance.  Respiratory: Negative for shortness of breath and wheezing  Cardiovascular: Negative for chest pain and palpitations.  Gastrointestinal: Negative for diarrhea, blood in stool, abdominal distention or other pain Genitourinary: Negative for hematuria, flank pain or change in urine volume.  Musculoskeletal: Negative for myalgias or other joint complaints.  Skin: Negative for color change and wound or drainage.  Neurological: Negative for syncope and numbness. other than noted Hematological: Negative for adenopathy. or other swelling Psychiatric/Behavioral: Negative for hallucinations, SI, self-injury, decreased concentration or other worsening agitation.      Objective:   Physical Exam BP 128/84 mmHg  Pulse 78  Temp(Src) 98.4 F (36.9 C) (Oral)  Resp 20  Ht 5\' 7"  (1.702 m)  Wt 299 lb 1.3 oz (135.662 kg)  BMI 46.83 kg/m2  SpO2 95% VS noted,  Constitutional: Pt is oriented to person, place, and time. Appears well-developed and well-nourished, in no significant distress Head: Normocephalic and atraumatic.  Right Ear: External ear normal.  Left Ear: External ear normal.  Nose: Nose normal.  Mouth/Throat: Oropharynx is clear and moist.  Eyes: Conjunctivae and EOM are normal. Pupils are equal, round, and reactive to light.  Neck: Normal range of motion. Neck supple. No JVD present. No tracheal deviation present or significant neck LA or mass Cardiovascular: Normal rate, regular rhythm, normal heart sounds and intact distal pulses.   Pulmonary/Chest: Effort normal and breath sounds without rales or wheezing  Abdominal: Soft. Bowel sounds are normal. NT. No HSM  Musculoskeletal: Normal range of motion. Exhibits no edema.    Lymphadenopathy:  Has no cervical adenopathy.  Neurological: Pt is alert and oriented to person, place, and time. Pt has normal reflexes. No cranial nerve deficit. Motor grossly intact Skin: Skin is warm and dry. No rash noted.  Psychiatric:  Has normal mood and affect. Behavior is normal.     Assessment & Plan:

## 2015-01-03 NOTE — Patient Instructions (Signed)
Please take all new medication as prescribed - the phentermine  Please continue all other medications as before, and refills have been done if requested.  Please have the pharmacy call with any other refills you may need.  Please continue your efforts at being more active, low cholesterol diet, and weight control.  You are otherwise up to date with prevention measures today.  Please keep your appointments with your specialists as you may have planned  Please go to the LAB in the Basement (turn left off the elevator) for the tests to be done today  You will be contacted by phone if any changes need to be made immediately.  Otherwise, you will receive a letter about your results with an explanation, but please check with MyChart first.  Please remember to sign up for MyChart if you have not done so, as this will be important to you in the future with finding out test results, communicating by private email, and scheduling acute appointments online when needed.  Please return in 6 months, or sooner if needed 

## 2015-01-03 NOTE — Addendum Note (Signed)
Addended by: Corwin LevinsJOHN, Markez Dowland W on: 01/03/2015 02:59 PM   Modules accepted: Orders

## 2015-01-03 NOTE — Assessment & Plan Note (Signed)

## 2015-01-03 NOTE — Progress Notes (Signed)
Pre visit review using our clinic review tool, if applicable. No additional management support is needed unless otherwise documented below in the visit note. 

## 2015-01-30 DIAGNOSIS — Z0279 Encounter for issue of other medical certificate: Secondary | ICD-10-CM

## 2015-02-15 ENCOUNTER — Other Ambulatory Visit: Payer: Self-pay | Admitting: Internal Medicine

## 2015-02-21 ENCOUNTER — Other Ambulatory Visit: Payer: Self-pay | Admitting: Internal Medicine

## 2015-03-21 ENCOUNTER — Telehealth: Payer: Self-pay | Admitting: Internal Medicine

## 2015-03-21 DIAGNOSIS — N390 Urinary tract infection, site not specified: Secondary | ICD-10-CM

## 2015-03-21 NOTE — Telephone Encounter (Signed)
Pt wife called in and said that pt was seen at [facility] and he is wanting to see if Dr Jonny RuizJohn can refer him to another Urologist to get a 2nd opinion??  [facility] wanting [facility] and they just want to make sure it is the right thing to do?      Best number [phone]

## 2015-03-22 NOTE — Telephone Encounter (Signed)
Unfortunately there is only one urology group in [place], and to see another doctor in the same group is often awkward though may be possible;  Would the pt want a different doctor same practice in [place], or consider [place], [place], [place] , [facility] or even [place] or [place]?

## 2015-03-22 NOTE — Telephone Encounter (Signed)
Referral done for UTI hx, since I have no specific information otherwise, or why surgury was recommended

## 2015-03-22 NOTE — Telephone Encounter (Signed)
Pt would like to be referred to another urology group in [place] or [place]

## 2015-03-26 ENCOUNTER — Other Ambulatory Visit: Payer: Self-pay | Admitting: Urology

## 2015-04-04 ENCOUNTER — Telehealth: Payer: Self-pay | Admitting: *Deleted

## 2015-04-04 MED ORDER — DULOXETINE HCL 60 MG PO CPEP: 60.0000 mg | ORAL_CAPSULE | Freq: Every day | ORAL | Status: DC

## 2015-04-04 NOTE — Telephone Encounter (Signed)
Left msg on triage needing husband cymbalta,  fax to pharamcy in [place] @ [phone].  Called wife back no answer LMOM will fax to # given...[name]

## 2015-04-06 ENCOUNTER — Encounter (HOSPITAL_BASED_OUTPATIENT_CLINIC_OR_DEPARTMENT_OTHER): Payer: Self-pay | Admitting: *Deleted

## 2015-04-06 NOTE — Progress Notes (Signed)
SPOKE W/ WIFE.  NPO AFTER MN.  ARRIVE AT 0600.  NEEDS ISTAT AND EKG.  WILL TAKE NORVASC AND CYMBALTA AM DOS W/ SIPS OF WATER.

## 2015-04-10 ENCOUNTER — Telehealth: Payer: Self-pay

## 2015-04-10 NOTE — Telephone Encounter (Signed)
Patient called to educate on Medicare Wellness apt. LVM for the patient to call back to educate and schedule for wellness visit.   

## 2015-04-10 NOTE — H&P (Signed)
Active Problems Problems  1. Benign prostatic hyperplasia with urinary obstruction (N40.1,N13.8) 2. Detrusor instability (N32.81) 3. Diverticulum, bladder acquired (N32.3) 4. Essential hypertension (I10) 5. Incomplete bladder emptying (R33.9) 6. Intermittent urinary stream (R39.13) 7. Meatal stenosis (N35.9) 8. Prostatitis, chronic (N41.1) 9. Sebaceous cyst (L72.3) 10. Weak urinary stream (R39.12)  History of Present Illness Darren Black returns today in f/u from treatment of his UTI. His UA is clear today. A flowrate showed a PF of only 76ml/sec but the average was 1ml/sec and the flow was very prolonged. His PVR is . He reports that he is voiding well with a good stream and feels empty despite the study findings.   Past Medical History Problems  1. History of Acute cystitis with hematuria (N30.01) 2. History of Anxiety (F41.9) 3. History of Arthritis 4. Essential hypertension (I10) 5. History of depression (Z86.59) 6. History of hypercholesterolemia (Z86.39) 7. History of sleep apnea (Z87.09) 8. Intermittent urinary stream (R39.13) 9. History of Panic attack (F41.0) 10. Prostatitis, chronic (N41.1) 11. Sebaceous cyst (L72.3) 12. Weak urinary stream (R39.12)  Surgical History Problems  1. History of Appendectomy 2. History of Shoulder Surgery Right  Current Meds 1. AmLODIPine Besylate 10 MG Oral Tablet; take 1/2 tablet daily;  Therapy: (Recorded:27Jun2016) to Recorded 2. Ampicillin 500 MG Oral Capsule; TAKE 1 CAPSULE 4 times daily;  Therapy: 30Jun2016 to (Last Rx:30Jun2016)  Requested for: 05Jul2016; Status: ACTIVE  - Renewal Denied Ordered 3. Benazepril HCl - 40 MG Oral Tablet;  Therapy: (Recorded:18Aug2008) to Recorded 4. Cymbalta 60 MG Oral Capsule Delayed Release Particles;  Therapy: (Recorded:18Aug2008) to Recorded 5. Hydrochlorothiazide 25 MG Oral Tablet; take 1/2 tablet daily;  Therapy: (Recorded:27Jun2016) to Recorded 6. Klor-Con 10 10 MEQ Oral Tablet Extended  Release;  Therapy: (Recorded:27Jun2016) to Recorded 7. LORazepam 2 MG Oral Tablet;  Therapy: (Recorded:27Jun2016) to Recorded 8. Lycopene 10 MG Oral Capsule;  Therapy: (Recorded:27Jun2016) to Recorded 9. Multi Vitamin/Minerals TABS;  Therapy: (Recorded:27Jun2016) to Recorded 10. Omega-3 CAPS;   Therapy: (Recorded:27Jun2016) to Recorded 11. Oxycodone-Acetaminophen 5-325 MG Oral Tablet;   Therapy: (Recorded:27Jun2016) to Recorded 12. Phentermine HCl - 37.5 MG Oral Tablet;   Therapy: (Recorded:27Jun2016) to Recorded  Allergies Medication  1. No Known Drug Allergies  Family History Problems  1. Family history of Acute Myocardial Infarction : Father 2. Family history of Arthritis : Mother 3. Family history of Diabetes Mellitus : Father 4. Family history of Hypercholesterolemia : Mother 5. Family history of Hypertension : Mother 27. Family history of Hypertension : Brother 7. Family history of Nephrolithiasis : Brother 8. Family history of Renal Failure : Mother 66. Family history of Throat cancer : Father  Social History Problems  1. Denied: Alcohol Use 2. Caffeine Use   1 3. Disabled 4. Father deceased 74. Marital History - Currently Married 6. Never a smoker 7. Denied: Tobacco Use  Vitals Vital Signs [Data Includes: Last 1 Day]  Recorded: 20Jul2016 10:15AM  Blood Pressure: 143 / 100 Temperature: 97.8 F Heart Rate: 75  Physical Exam Constitutional: Well nourished and well developed . No acute distress.  Pulmonary: No respiratory distress and normal respiratory rhythm and effort.  Cardiovascular: Heart rate and rhythm are normal . No peripheral edema.  Genitourinary: Examination of the penis demonstrates meatal stenosis (with a pinhole opening. ). The penis is circumcised.    Results/Data Urine [Data Includes: Last 1 Day]   20Jul2016  COLOR YELLOW   APPEARANCE CLEAR   SPECIFIC GRAVITY 1.010   pH 5.5   GLUCOSE NEG mg/dL  BILIRUBIN NEG  KETONE NEG mg/dL  BLOOD  NEG   PROTEIN NEG mg/dL  UROBILINOGEN 0.2 mg/dL  NITRITE NEG   LEUKOCYTE ESTERASE NEG    The following images/tracing/specimen were independently visualized:  Renal US today shows an 11.41cm right kidney with a 7.24mm hyperechoic area in the RMP that is possibly a stone. There are no masses or hydro. The right kidney is 11.85cm with a 2.06 cm LMP cyst that appears simple. There are no stones or hydro. His bladder volume is after a second void. The bladder wall is about 8mm thick and there is no prostate middle lobe.  The following clinical lab reports were reviewed:  UA and recent culture reviewed.  Flow Rate: Voided 117 ml. A peak flow rate of 83ml/s and mean flow rate of 41ml/s . Flow is very reduced and sustained.  PVR: Ultrasound PVR 536 ml. Selected Results  URINE CULTURE 27Jun2016 03:24PM Darren Black  SOURCE : CLEAN CATCH SPECIMEN TYPE: CLEAN CATCH   Test Name Result Flag Reference  CULTURE, URINE Culture, Urine    ===== COLONY COUNT: =====  >=100,000 COLONIES/ML   FINAL REPORT: ENTEROCOCCUS SPECIES   SENSITIVITY FOR: ENTEROCOCCUS SPECIES    AMPICILLIN                             SENSITIVE        <=2    LEVOFLOXACIN                           SENSITIVE          1    NITROFURANTOIN                         SENSITIVE       <=16    VANCOMYCIN                             SENSITIVE          1    TETRACYCLINE                           SENSITIVE        <=1    END OF REPORT   Assessment Assessed  1. Meatal stenosis (N35.9) 2. Weak urinary stream (R39.12) 3. History of Acute cystitis with hematuria (N30.01) 4. Incomplete bladder emptying (R33.9)  The UTI has cleared but he has meatal stenosis with an elevated PVR and quite reduced stream.  He has a small left renal cyst and a possible 7mm right renal stone.   Plan Health Maintenance  1. UA With REFLEX; [Do Not Release]; Status:Resulted - Requires Verification;   Done:  20Jul2016 10:19AM Incomplete bladder emptying  2.  RENAL U/S COMPLETE; Status:Resulted - Requires Verification;   Done: 20Jul2016  11:39AM Meatal stenosis  3. Follow-up Schedule Surgery Office  Follow-up  Status: Complete  Done: 20Jul2016  I am going to get him set up for a Retrograde urethrogram with cystoscopy and meatal dilation. I reviewed the risks of bleeding, infection, urethral injury, recurrent stricturing and the need for self dilation, thrombotic events and anesthetic complications.   Signatures Electronically signed by : Darren Black, M.D.; Apr 06 2015 11:23AM EST

## 2015-04-12 ENCOUNTER — Encounter (HOSPITAL_BASED_OUTPATIENT_CLINIC_OR_DEPARTMENT_OTHER): Payer: Self-pay

## 2015-04-12 ENCOUNTER — Ambulatory Visit (HOSPITAL_BASED_OUTPATIENT_CLINIC_OR_DEPARTMENT_OTHER): Payer: Medicare HMO | Admitting: Anesthesiology

## 2015-04-12 ENCOUNTER — Encounter (HOSPITAL_BASED_OUTPATIENT_CLINIC_OR_DEPARTMENT_OTHER)
Admission: RE | Disposition: A | Discharge status: Home / Self Care | Payer: Self-pay | Source: Ambulatory Visit | Attending: Urology

## 2015-04-12 ENCOUNTER — Ambulatory Visit (HOSPITAL_BASED_OUTPATIENT_CLINIC_OR_DEPARTMENT_OTHER)
Admission: RE | Admit: 2015-04-12 | Discharge: 2015-04-12 | Disposition: A | Discharge status: Home / Self Care | Payer: Medicare HMO | Source: Ambulatory Visit | Attending: Urology | Admitting: Urology

## 2015-04-12 DIAGNOSIS — N3289 Other specified disorders of bladder: Secondary | ICD-10-CM | POA: Diagnosis not present

## 2015-04-12 DIAGNOSIS — Z79899 Other long term (current) drug therapy: Secondary | ICD-10-CM | POA: Diagnosis not present

## 2015-04-12 DIAGNOSIS — Z833 Family history of diabetes mellitus: Secondary | ICD-10-CM | POA: Insufficient documentation

## 2015-04-12 DIAGNOSIS — N401 Enlarged prostate with lower urinary tract symptoms: Secondary | ICD-10-CM | POA: Insufficient documentation

## 2015-04-12 DIAGNOSIS — Z6841 Body Mass Index (BMI) 40.0 and over, adult: Secondary | ICD-10-CM | POA: Diagnosis not present

## 2015-04-12 DIAGNOSIS — N359 Urethral stricture, unspecified: Secondary | ICD-10-CM | POA: Insufficient documentation

## 2015-04-12 DIAGNOSIS — F329 Major depressive disorder, single episode, unspecified: Secondary | ICD-10-CM | POA: Insufficient documentation

## 2015-04-12 DIAGNOSIS — R3913 Splitting of urinary stream: Secondary | ICD-10-CM | POA: Insufficient documentation

## 2015-04-12 DIAGNOSIS — N411 Chronic prostatitis: Secondary | ICD-10-CM | POA: Insufficient documentation

## 2015-04-12 DIAGNOSIS — R3914 Feeling of incomplete bladder emptying: Secondary | ICD-10-CM | POA: Diagnosis not present

## 2015-04-12 DIAGNOSIS — E780 Pure hypercholesterolemia: Secondary | ICD-10-CM | POA: Insufficient documentation

## 2015-04-12 DIAGNOSIS — R3912 Poor urinary stream: Secondary | ICD-10-CM | POA: Diagnosis not present

## 2015-04-12 DIAGNOSIS — F419 Anxiety disorder, unspecified: Secondary | ICD-10-CM | POA: Diagnosis not present

## 2015-04-12 DIAGNOSIS — N3281 Overactive bladder: Secondary | ICD-10-CM | POA: Insufficient documentation

## 2015-04-12 DIAGNOSIS — M1990 Unspecified osteoarthritis, unspecified site: Secondary | ICD-10-CM | POA: Insufficient documentation

## 2015-04-12 DIAGNOSIS — G4730 Sleep apnea, unspecified: Secondary | ICD-10-CM | POA: Diagnosis not present

## 2015-04-12 DIAGNOSIS — Z87440 Personal history of urinary (tract) infections: Secondary | ICD-10-CM | POA: Insufficient documentation

## 2015-04-12 DIAGNOSIS — I10 Essential (primary) hypertension: Secondary | ICD-10-CM | POA: Insufficient documentation

## 2015-04-12 DIAGNOSIS — N138 Other obstructive and reflux uropathy: Secondary | ICD-10-CM | POA: Insufficient documentation

## 2015-04-12 HISTORY — DX: Obstructive sleep apnea (adult) (pediatric): Z99.89

## 2015-04-12 HISTORY — DX: Reserved for concepts with insufficient information to code with codable children: IMO0002

## 2015-04-12 HISTORY — DX: Other specified postprocedural states: Z98.890

## 2015-04-12 HISTORY — DX: Family history of other specified conditions: Z84.89

## 2015-04-12 HISTORY — DX: Major depressive disorder, single episode, unspecified: F32.9

## 2015-04-12 HISTORY — DX: Other specified postprocedural states: R11.2

## 2015-04-12 HISTORY — DX: Anxiety disorder, unspecified: F41.9

## 2015-04-12 HISTORY — DX: Hyperlipidemia, unspecified: E78.5

## 2015-04-12 HISTORY — DX: Obstructive sleep apnea (adult) (pediatric): G47.33

## 2015-04-12 HISTORY — PX: URETHROGRAM: SHX6163

## 2015-04-12 HISTORY — DX: Depression, unspecified: F32.A

## 2015-04-12 HISTORY — PX: CYSTOSCOPY WITH URETHRAL DILATATION: SHX5125

## 2015-04-12 HISTORY — DX: Personal history of other mental and behavioral disorders: Z86.59

## 2015-04-12 HISTORY — DX: Benign prostatic hyperplasia without lower urinary tract symptoms: N40.0

## 2015-04-12 HISTORY — DX: Essential (primary) hypertension: I10

## 2015-04-12 LAB — POCT I-STAT 4, (NA,K, GLUC, HGB,HCT)
GLUCOSE: 105 mg/dL — AB (ref 65–99)
HEMATOCRIT: 43 % (ref 39.0–52.0)
HEMOGLOBIN: 14.6 g/dL (ref 13.0–17.0)
Potassium: 3.5 mmol/L (ref 3.5–5.1)
Sodium: 139 mmol/L (ref 135–145)

## 2015-04-12 SURGERY — CYSTOSCOPY, WITH URETHRAL DILATION
Anesthesia: General

## 2015-04-12 MED ORDER — ACETAMINOPHEN 325 MG PO TABS
650.0000 mg | ORAL_TABLET | ORAL | Status: DC | PRN
  Filled 2015-04-12: qty 2

## 2015-04-12 MED ORDER — FENTANYL CITRATE (PF) 100 MCG/2ML IJ SOLN
25.0000 ug | INTRAMUSCULAR | Status: DC | PRN
  Filled 2015-04-12: qty 1

## 2015-04-12 MED ORDER — ONDANSETRON HCL 4 MG/2ML IJ SOLN
INTRAMUSCULAR | Status: DC | PRN
  Administered 2015-04-12: 4 mg via INTRAVENOUS

## 2015-04-12 MED ORDER — IOHEXOL 350 MG/ML SOLN
INTRAVENOUS | Status: DC | PRN
  Administered 2015-04-12: 23 mL via URETHRAL

## 2015-04-12 MED ORDER — PROPOFOL 10 MG/ML IV BOLUS
INTRAVENOUS | Status: DC | PRN
  Administered 2015-04-12: 350 mg via INTRAVENOUS

## 2015-04-12 MED ORDER — STERILE WATER FOR IRRIGATION IR SOLN
Status: DC | PRN
  Administered 2015-04-12: 3000 mL via INTRAVESICAL

## 2015-04-12 MED ORDER — MIDAZOLAM HCL 2 MG/2ML IJ SOLN
INTRAMUSCULAR | Status: AC
  Filled 2015-04-12: qty 2

## 2015-04-12 MED ORDER — FENTANYL CITRATE (PF) 100 MCG/2ML IJ SOLN
INTRAMUSCULAR | Status: AC
  Filled 2015-04-12: qty 4

## 2015-04-12 MED ORDER — LIDOCAINE HCL (CARDIAC) 20 MG/ML IV SOLN
INTRAVENOUS | Status: DC | PRN
Start: 2015-04-12 — End: 2015-04-12
  Administered 2015-04-12: 100 mg via INTRAVENOUS

## 2015-04-12 MED ORDER — KETOROLAC TROMETHAMINE 30 MG/ML IJ SOLN
30.0000 mg | Freq: Once | INTRAMUSCULAR | Status: DC
  Filled 2015-04-12: qty 1

## 2015-04-12 MED ORDER — ACETAMINOPHEN 650 MG RE SUPP
650.0000 mg | RECTAL | Status: DC | PRN
  Filled 2015-04-12: qty 1

## 2015-04-12 MED ORDER — CIPROFLOXACIN IN D5W 400 MG/200ML IV SOLN
INTRAVENOUS | Status: AC
  Filled 2015-04-12: qty 200

## 2015-04-12 MED ORDER — SODIUM CHLORIDE 0.9 % IJ SOLN
3.0000 mL | INTRAMUSCULAR | Status: DC | PRN
  Filled 2015-04-12: qty 3

## 2015-04-12 MED ORDER — LACTATED RINGERS IV SOLN
INTRAVENOUS | Status: DC
  Administered 2015-04-12 (×3): via INTRAVENOUS
  Filled 2015-04-12: qty 1000

## 2015-04-12 MED ORDER — OXYCODONE HCL 5 MG PO TABS
5.0000 mg | ORAL_TABLET | ORAL | Status: DC | PRN
  Filled 2015-04-12: qty 2

## 2015-04-12 MED ORDER — MIDAZOLAM HCL 5 MG/5ML IJ SOLN
INTRAMUSCULAR | Status: DC | PRN
  Administered 2015-04-12 (×2): 1 mg via INTRAVENOUS

## 2015-04-12 MED ORDER — CIPROFLOXACIN IN D5W 400 MG/200ML IV SOLN
400.0000 mg | INTRAVENOUS | Status: AC
  Administered 2015-04-12: 400 mg via INTRAVENOUS
  Filled 2015-04-12: qty 200

## 2015-04-12 MED ORDER — PROMETHAZINE HCL 25 MG/ML IJ SOLN
6.2500 mg | INTRAMUSCULAR | Status: DC | PRN
  Filled 2015-04-12: qty 1

## 2015-04-12 MED ORDER — SODIUM CHLORIDE 0.9 % IJ SOLN
3.0000 mL | Freq: Two times a day (BID) | INTRAMUSCULAR | Status: DC
  Filled 2015-04-12: qty 3

## 2015-04-12 MED ORDER — PHENAZOPYRIDINE HCL 200 MG PO TABS
200.0000 mg | ORAL_TABLET | Freq: Three times a day (TID) | ORAL | Status: DC | PRN
Start: 2015-04-12 — End: 2016-01-17

## 2015-04-12 MED ORDER — SODIUM CHLORIDE 0.9 % IV SOLN
250.0000 mL | INTRAVENOUS | Status: DC | PRN
  Filled 2015-04-12: qty 250

## 2015-04-12 MED ORDER — DEXAMETHASONE SODIUM PHOSPHATE 10 MG/ML IJ SOLN
INTRAMUSCULAR | Status: DC | PRN
  Administered 2015-04-12: 10 mg via INTRAVENOUS

## 2015-04-12 MED ORDER — ACETAMINOPHEN 10 MG/ML IV SOLN
INTRAVENOUS | Status: DC | PRN
  Administered 2015-04-12: 1000 mg via INTRAVENOUS

## 2015-04-12 MED ORDER — SODIUM CHLORIDE 0.9 % IR SOLN
Status: DC | PRN
  Administered 2015-04-12: 4000 mL via INTRAVESICAL

## 2015-04-12 MED ORDER — FENTANYL CITRATE (PF) 100 MCG/2ML IJ SOLN
INTRAMUSCULAR | Status: DC | PRN
  Administered 2015-04-12 (×2): 25 ug via INTRAVENOUS

## 2015-04-12 SURGICAL SUPPLY — 24 items
BAG DRAIN URO-CYSTO SKYTR STRL (DRAIN) ×2 IMPLANT
BALLN NEPHROSTOMY (BALLOONS) ×2
BALLOON NEPHROSTOMY (BALLOONS) ×1 IMPLANT
CANISTER SUCT LVC 12 LTR MEDI- (MISCELLANEOUS) IMPLANT
CATH FOLEY 2WAY SLVR  5CC 16FR (CATHETERS)
CATH FOLEY 2WAY SLVR 5CC 16FR (CATHETERS) IMPLANT
CLOTH BEACON ORANGE TIMEOUT ST (SAFETY) ×2 IMPLANT
ELECT REM PT RETURN 9FT ADLT (ELECTROSURGICAL) ×2
ELECTRODE REM PT RTRN 9FT ADLT (ELECTROSURGICAL) ×1 IMPLANT
GLOVE SURG SS PI 8.0 STRL IVOR (GLOVE) ×2 IMPLANT
GOWN STRL REUS W/ TWL LRG LVL3 (GOWN DISPOSABLE) ×1 IMPLANT
GOWN STRL REUS W/ TWL XL LVL3 (GOWN DISPOSABLE) ×1 IMPLANT
GOWN STRL REUS W/TWL LRG LVL3 (GOWN DISPOSABLE) ×1
GOWN STRL REUS W/TWL XL LVL3 (GOWN DISPOSABLE) ×1
GUIDEWIRE STR DUAL SENSOR (WIRE) IMPLANT
MANIFOLD NEPTUNE II (INSTRUMENTS) IMPLANT
NDL SAFETY ECLIPSE 18X1.5 (NEEDLE) IMPLANT
NEEDLE HYPO 18GX1.5 SHARP (NEEDLE)
NEEDLE HYPO 22GX1.5 SAFETY (NEEDLE) IMPLANT
NS IRRIG 500ML POUR BTL (IV SOLUTION) IMPLANT
PACK CYSTO (CUSTOM PROCEDURE TRAY) ×2 IMPLANT
SYR 20CC LL (SYRINGE) IMPLANT
SYR 30ML LL (SYRINGE) IMPLANT
WATER STERILE IRR 3000ML UROMA (IV SOLUTION) ×2 IMPLANT

## 2015-04-12 NOTE — Anesthesia Procedure Notes (Signed)
Procedure Name: LMA Insertion Date/Time: 04/12/2015 7:35 AM Performed by: Norva Pavlov Pre-anesthesia Checklist: Patient identified, Emergency Drugs available, Suction available and Patient being monitored Patient Re-evaluated:Patient Re-evaluated prior to inductionOxygen Delivery Method: Circle System Utilized Preoxygenation: Pre-oxygenation with 100% oxygen Intubation Type: IV induction Ventilation: Mask ventilation without difficulty LMA: LMA inserted LMA Size: 5.0 Number of attempts: 1 Airway Equipment and Method: bite block Placement Confirmation: positive ETCO2 Tube secured with: Tape Dental Injury: Teeth and Oropharynx as per pre-operative assessment  Comments: Placed by Iline Oven, CRNA

## 2015-04-12 NOTE — Brief Op Note (Signed)
04/12/2015  8:02 AM  PATIENT:  Darren Black  49 y.o. male  PRE-OPERATIVE DIAGNOSIS:  meatal stenosis  POST-OPERATIVE DIAGNOSIS:  meatal stenosis with anterior urethral stricture disease and bladder wall lesion. PROCEDURE:  Procedure(s): CYSTOSCOPY WITH URETHRAL DILATATION, RETROGRADE AND URETHROGRAM (N/A) URETHROGRAM (N/A) BIOPSY WITH FULGURATION  SURGEON:  Surgeon(s) and Role:    * Bjorn Pippin, MD - Primary  PHYSICIAN ASSISTANT:   ASSISTANTS: none   ANESTHESIA:   general  EBL:  Total I/O In: 100 [I.V.:100] Out: -   BLOOD ADMINISTERED:none  DRAINS: none   LOCAL MEDICATIONS USED:  NONE  SPECIMEN:  Source of Specimen:  bladder biopsy  DISPOSITION OF SPECIMEN:  PATHOLOGY  COUNTS:  YES  TOURNIQUET:  * No tourniquets in log *  DICTATION: .Other Dictation: Dictation Number [id number]  PLAN OF CARE: Discharge to home after PACU  PATIENT DISPOSITION:  PACU - hemodynamically stable.   Delay start of Pharmacological VTE agent (>24hrs) due to surgical blood loss or risk of bleeding: not applicable

## 2015-04-12 NOTE — Anesthesia Postprocedure Evaluation (Signed)
  Anesthesia Post-op Note  Patient: Darren Black  Procedure(s) Performed: Procedure(s) (LRB): CYSTOSCOPY WITH URETHRAL DILATATION, RETROGRADE AND URETHROGRAM (N/A) URETHROGRAM (N/A)  Patient Location: PACU  Anesthesia Type: General  Level of Consciousness: awake and alert   Airway and Oxygen Therapy: Patient Spontanous Breathing  Post-op Pain: mild  Post-op Assessment: Post-op Vital signs reviewed, Patient's Cardiovascular Status Stable, Respiratory Function Stable, Patent Airway and No signs of Nausea or vomiting  Last Vitals:  Filed Vitals:   04/12/15 0921  BP: 123/76  Pulse: 67  Temp: 37 C  Resp: 16    Post-op Vital Signs: stable   Complications: No apparent anesthesia complications

## 2015-04-12 NOTE — Op Note (Signed)
NAME:  Darren Black, Darren Black NO.:  [id number]  MEDICAL RECORD NO.:  [id number]  LOCATION:                                 FACILITY:  PHYSICIAN:  Kendall Justo. Annabell Howells, M.D.    DATE OF BIRTH:  1965/12/04  DATE OF PROCEDURE:  04/12/2015 DATE OF DISCHARGE:                              OPERATIVE REPORT   PROCEDURE: 1. Retrograde urethrogram. 2. Balloon urethral dilation. 3. Cystoscopy with bladder biopsy and fulguration.  PREOPERATIVE DIAGNOSIS:  Meatal stenosis.  POSTOPERATIVE DIAGNOSIS:  Meatal stenosis with mild anterior urethral stricture disease and bladder wall lesions.  SURGEON:  Subhan Hoopes. Annabell Howells, M.D.  ANESTHESIA:  General.  SPECIMENS:  Bladder biopsies x2.  DRAINS:  None.  BLOOD LOSS:  None.  COMPLICATIONS:  None.  INDICATIONS:  [name] is a 49 year old white male with obstructive voiding symptoms who was found to have severe meatal stenosis with a reduced flow on office examination.  He is to undergo retrograde urethrogram with urethral dilation and cystoscopy today.  FINDINGS AND PROCEDURE:  He was given Cipro.  He was taken to the operating room where general anesthetic was induced.  He was placed in lithotomy position.  His perineum and genitalia were prepped with Betadine solution and he was draped in usual sterile fashion.  PAS hose had been placed prior to prepping.  A 5-French open-end catheter was passed through the urethral meatus and contrast was instilled.  This revealed some mild narrowing of the anterior urethra, but a normal-caliber bulb and the contrast flowed through the prostate into the bladder.  After completion of the retrograde pyelogram, a guidewire was passed into the bladder and a 24- French 15-cm balloon was placed over the wire across the meatus to the bulb.  The balloon was then inflated to 18 atmospheres of pressure and held for a minute and then deflated.  Cystoscopy was performed using a 23-French scope and 30 and 70  degree lenses.  Examination revealed some mild mucosal changes of the anterior urethra consistent with stricture, but no other significant strictures were noted.  The bulb was unremarkable.  The external sphincter was intact.  The prostatic urethra was about 3 cm in length with bilobar hyperplasia and a high bladder neck with some obstruction.  The bladder wall had mild trabeculation.  There were some diffuse patchy bladder wall lesions that had some evidence of possible hemosiderin deposition most consistent with inflammatory change, but it was felt biopsy was indicated.  The ureteral orifices were unremarkable as well. No other tumors or stones were noted.  After initial cystoscopic inspection, cup biopsy forceps was used to obtain 2 biopsies from the posterior wall from the representative lesions.  A Bugbee electrode was then used to fulgurate the biopsy sites to control hemostasis.  Saline was initially used as an irrigant, but changed to water for fulguration.  At this point, the cystoscope was removed.  The red rubber catheter was used to drain the bladder.  The patient was taken down from lithotomy position.  His anesthetic was reversed.  He was moved to recovery room in stable condition.  There were no complications.     Excell Seltzer. Annabell Howells, M.D.  JJW/MEDQ  D:  04/12/2015  T:  04/12/2015  Job:  [id number]

## 2015-04-12 NOTE — Discharge Instructions (Signed)
CYSTOSCOPY HOME CARE INSTRUCTIONS ° °Activity: °Rest for the remainder of the day.  Do not drive or operate equipment today.  You may resume normal activities in one to two days as instructed by your physician.  ° °Meals: °Drink plenty of liquids and eat light foods such as gelatin or soup this evening.  You may return to a normal meal plan tomorrow. ° °Return to Work: °You may return to work in one to two days or as instructed by your physician. ° °Special Instructions / Symptoms: °Call your physician if any of these symptoms occur: ° ° -persistent or heavy bleeding ° -bleeding which continues after first few urination ° -large blood clots that are difficult to pass ° -urine stream diminishes or stops completely ° -fever equal to or higher than 101 degrees Farenheit. ° -cloudy urine with a strong, foul odor ° -severe pain ° °Females should always wipe from front to back after elimination.  You may feel some burning pain when you urinate.  This should disappear with time.  Applying moist heat to the lower abdomen or a hot tub bath may help relieve the pain. \ ° °Follow-Up / Date of Return Visit to Your Physician:  °Post Anesthesia Home Care Instructions ° °Activity: °Get plenty of rest for the remainder of the day. A responsible adult should stay with you for 24 hours following the procedure.  °For the next 24 hours, DO NOT: °-Drive a car °-Operate machinery °-Drink alcoholic beverages °-Take any medication unless instructed by your physician °-Make any legal decisions or sign important papers. ° °Meals: °Start with liquid foods such as gelatin or soup. Progress to regular foods as tolerated. Avoid greasy, spicy, heavy foods. If nausea and/or vomiting occur, drink only clear liquids until the nausea and/or vomiting subsides. Call your physician if vomiting continues. ° °Special Instructions/Symptoms: °Your throat may feel dry or sore from the anesthesia or the breathing tube placed in your throat during surgery. If  this causes discomfort, gargle with warm salt water. The discomfort should disappear within 24 hours. ° °If you had a scopolamine patch placed behind your ear for the management of post- operative nausea and/or vomiting: ° °1. The medication in the patch is effective for 72 hours, after which it should be removed.  Wrap patch in a tissue and discard in the trash. Wash hands thoroughly with soap and water. °2. You may remove the patch earlier than 72 hours if you experience unpleasant side effects which may include dry mouth, dizziness or visual disturbances. °3. Avoid touching the patch. Wash your hands with soap and water after contact with the patch. °  ° °Call for an appointment to arrange follow-up. ° °Patient Signature:  ________________________________________________________ ° °Nurse's Signature:  ________________________________________________________ ° °

## 2015-04-12 NOTE — Transfer of Care (Signed)
Last Vitals:  Filed Vitals:   04/12/15 0612  BP: 153/82  Pulse: 70  Temp: 37 C  Resp: 18   Immediate Anesthesia Transfer of Care Note  Patient: Darren Black  Procedure(s) Performed: Procedure(s) (LRB): CYSTOSCOPY WITH URETHRAL DILATATION, RETROGRADE AND URETHROGRAM (N/A) URETHROGRAM (N/A)  Patient Location: PACU  Anesthesia Type: General  Level of Consciousness: awake, alert  and oriented  Airway & Oxygen Therapy: Patient Spontanous Breathing and Patient connected to face mask oxygen  Post-op Assessment: Report given to PACU RN and Post -op Vital signs reviewed and stable  Post vital signs: Reviewed and stable  Complications: No apparent anesthesia complications

## 2015-04-12 NOTE — Anesthesia Preprocedure Evaluation (Signed)
Anesthesia Evaluation  Patient identified by MRN, date of birth, ID band Patient awake    Reviewed: Allergy & Precautions, NPO status , Patient's Chart, lab work & pertinent test results  Airway Mallampati: III  TM Distance: <3 FB Neck ROM: Full    Dental no notable dental hx.    Pulmonary sleep apnea and Continuous Positive Airway Pressure Ventilation ,  breath sounds clear to auscultation  Pulmonary exam normal       Cardiovascular hypertension, Normal cardiovascular examRhythm:Regular Rate:Normal     Neuro/Psych negative neurological ROS  negative psych ROS   GI/Hepatic negative GI ROS, Neg liver ROS,   Endo/Other  Morbid obesity  Renal/GU negative Renal ROS  negative genitourinary   Musculoskeletal negative musculoskeletal ROS (+)   Abdominal   Peds negative pediatric ROS (+)  Hematology negative hematology ROS (+)   Anesthesia Other Findings   Reproductive/Obstetrics negative OB ROS                             Anesthesia Physical Anesthesia Plan  ASA: III  Anesthesia Plan: General   Post-op Pain Management:    Induction: Intravenous  Airway Management Planned: LMA  Additional Equipment:   Intra-op Plan:   Post-operative Plan: Extubation in OR  Informed Consent: I have reviewed the patients History and Physical, chart, labs and discussed the procedure including the risks, benefits and alternatives for the proposed anesthesia with the patient or authorized representative who has indicated his/her understanding and acceptance.   Dental advisory given  Plan Discussed with: CRNA and Surgeon  Anesthesia Plan Comments:         Anesthesia Quick Evaluation

## 2015-04-12 NOTE — Interval H&P Note (Signed)
History and Physical Interval Note:  04/12/2015 7:25 AM  Darren Black  has presented today for surgery, with the diagnosis of meatal stenosis  The various methods of treatment have been discussed with the patient and family. After consideration of risks, benefits and other options for treatment, the patient has consented to  Procedure(s): CYSTOSCOPY WITH URETHRAL DILATATION, RETROGRADE AND URETHROGRAM (N/A) URETHROGRAM (N/A) as a surgical intervention .  The patient's history has been reviewed, patient examined, no change in status, stable for surgery.  I have reviewed the patient's chart and labs.  Questions were answered to the patient's satisfaction.     Elida Harbin,Kinnick J

## 2015-04-13 ENCOUNTER — Encounter (HOSPITAL_BASED_OUTPATIENT_CLINIC_OR_DEPARTMENT_OTHER): Payer: Self-pay | Admitting: Urology

## 2015-04-18 ENCOUNTER — Other Ambulatory Visit: Payer: Self-pay | Admitting: Internal Medicine

## 2015-05-02 ENCOUNTER — Telehealth: Payer: Self-pay

## 2015-05-02 NOTE — Telephone Encounter (Signed)
Call to the patient regarding AWV; States that he appreciates the call but is really not interested in this right now. Stated if he changes his mind in the future and would like to set some wellness goals to give me a call.

## 2015-05-29 ENCOUNTER — Telehealth: Payer: Self-pay | Admitting: Internal Medicine

## 2015-05-29 MED ORDER — OXYCODONE-ACETAMINOPHEN 5-325 MG PO TABS: 1.0000 | ORAL_TABLET | Freq: Every day | ORAL | Status: DC | PRN

## 2015-05-29 NOTE — Telephone Encounter (Signed)
Pt requesting refill for oxyCODONE-acetaminophen (PERCOCET/ROXICET) 5-325 MG per tablet [[id number]]

## 2015-05-29 NOTE — Telephone Encounter (Signed)
Done hardcopy to Dahlia  

## 2015-05-30 NOTE — Telephone Encounter (Signed)
Pt informed, Rx in cabinet for pt pick up  

## 2015-06-22 DIAGNOSIS — G478 Other sleep disorders: Secondary | ICD-10-CM | POA: Diagnosis not present

## 2015-06-22 DIAGNOSIS — G4733 Obstructive sleep apnea (adult) (pediatric): Secondary | ICD-10-CM | POA: Diagnosis not present

## 2015-07-04 ENCOUNTER — Encounter: Payer: Self-pay | Admitting: Internal Medicine

## 2015-07-04 ENCOUNTER — Ambulatory Visit (INDEPENDENT_AMBULATORY_CARE_PROVIDER_SITE_OTHER): Payer: Medicare HMO | Admitting: Internal Medicine

## 2015-07-04 VITALS — BP 132/86 | HR 83 | Temp 98.3°F | Ht 67.0 in | Wt 293.0 lb

## 2015-07-04 DIAGNOSIS — Z0189 Encounter for other specified special examinations: Secondary | ICD-10-CM

## 2015-07-04 DIAGNOSIS — R339 Retention of urine, unspecified: Secondary | ICD-10-CM | POA: Diagnosis not present

## 2015-07-04 DIAGNOSIS — E785 Hyperlipidemia, unspecified: Secondary | ICD-10-CM | POA: Diagnosis not present

## 2015-07-04 DIAGNOSIS — Z0000 Encounter for general adult medical examination without abnormal findings: Secondary | ICD-10-CM

## 2015-07-04 DIAGNOSIS — I10 Essential (primary) hypertension: Secondary | ICD-10-CM

## 2015-07-04 MED ORDER — BENAZEPRIL HCL 40 MG PO TABS: 40.0000 mg | ORAL_TABLET | Freq: Every day | ORAL | Status: DC

## 2015-07-04 MED ORDER — OXYCODONE-ACETAMINOPHEN 5-325 MG PO TABS: 1.0000 | ORAL_TABLET | Freq: Every day | ORAL | Status: DC | PRN

## 2015-07-04 MED ORDER — HYDROCHLOROTHIAZIDE 25 MG PO TABS: 12.5000 mg | ORAL_TABLET | Freq: Every day | ORAL | Status: DC

## 2015-07-04 MED ORDER — TAMSULOSIN HCL 0.4 MG PO CAPS: 0.4000 mg | ORAL_CAPSULE | Freq: Every day | ORAL | Status: DC

## 2015-07-04 MED ORDER — ROSUVASTATIN CALCIUM 20 MG PO TABS: 20.0000 mg | ORAL_TABLET | Freq: Every day | ORAL | Status: DC

## 2015-07-04 NOTE — Progress Notes (Signed)
Pre visit review using our clinic review tool, if applicable. No additional management support is needed unless otherwise documented below in the visit note. 

## 2015-07-04 NOTE — Progress Notes (Signed)
Subjective:    Patient ID: Darren ShearerJohn Black, male    DOB: 04-13-1966, 49 y.o.   MRN: [id number]  HPI  Here to f/u; overall doing ok,  Pt denies chest pain, increasing sob or doe, wheezing, orthopnea, PND, increased LE swelling, palpitations, dizziness or syncope.  Pt denies new neurological symptoms such as new headache, or facial or extremity weakness or numbness.  Pt denies polydipsia, polyuria, or low sugar episode.   Pt denies new neurological symptoms such as new headache, or facial or extremity weakness or numbness.   Pt states overall good compliance with meds, mostly trying to follow appropriate diet, with wt overall stable,  but little exercise however.  Does have some mild urinary retention symptoms ongoing, asks for flomax to be changed to generic so is more affordable Past Medical History  Diagnosis Date  . Chronic lower back pain     nerve damage w/ leg pain  . Anxiety   . Depression   . Hyperlipidemia   . Meatal stenosis   . PONV (postoperative nausea and vomiting)   . Family history of adverse reaction to anesthesia     MOTHER--- HARD TO WAKE  . OSA on CPAP     moderate to severe OSA  per study 04-28-2007 uses CPAP  . Hypertension   . History of panic attacks   . BPH (benign prostatic hypertrophy)    Past Surgical History  Procedure Laterality Date  . Breath tek h pylori  04/27/2012    Procedure: BREATH TEK H PYLORI;  Surgeon: Valarie MerinoMatthew B Martin, MD;  [facility];  Service: General;  Laterality: N/A;  . Rotator cuff repair Right 01-21-2012  . Appendectomy  2003  . Transthoracic echocardiogram  03-26-2007    normal LVSF, ef 60%,  mild AV calcification without stenosis,  mild MR and TR,  mild LAE  . Cystoscopy with urethral dilatation N/A 04/12/2015    Procedure: CYSTOSCOPY WITH URETHRAL DILATATION, RETROGRADE AND URETHROGRAM WITH BLADDER BIOPSY;  Surgeon: Bjorn PippinJohn Wrenn, MD;  [facility];  Service: Urology;  Laterality: N/A;  . Urethrogram N/A  04/12/2015    Procedure: URETHROGRAM;  Surgeon: Bjorn PippinJohn Wrenn, MD;  [facility];  Service: Urology;  Laterality: N/A;    reports that he has never smoked. He has never used smokeless tobacco. He reports that he drinks alcohol. He reports that he does not use illicit drugs. family history includes Cancer in his father; Coronary artery disease in his father; Diabetes in his other; Hyperlipidemia in his other; Hypertension in his other; Stroke in his other. No Known Allergies Current Outpatient Prescriptions on File Prior to Visit  Medication Sig Dispense Refill  . amLODipine (NORVASC) 10 MG tablet Take 5 mg by mouth every morning.    . DULoxetine (CYMBALTA) 60 MG capsule Take 1 capsule (60 mg total) by mouth daily. (Patient taking differently: Take 60 mg by mouth every morning. ) 90 capsule 3  . KLOR-CON M20 20 MEQ tablet TAKE ONE-HALF TABLET BY MOUTH ONCE DAILY (Patient taking differently: TAKE ONE-HALF TABLET BY MOUTH ONCE DAILY--  TAKES IN AM) 45 tablet 3  . LORazepam (ATIVAN) 0.5 MG tablet Take 0.5 mg by mouth every 8 (eight) hours.    . Lycopene 10 MG CAPS Take 1 capsule by mouth every evening.    . Melatonin 10 MG TABS Take 10 mg by mouth at bedtime.    . Omega 3-6-9 Fatty Acids (OMEGA 3-6-9 COMPLEX PO) Take 1 capsule by mouth  daily.     . Multiple Vitamin (MULTIVITAMIN) tablet Take 1 tablet by mouth daily.      . phenazopyridine (PYRIDIUM) 200 MG tablet Take 1 tablet (200 mg total) by mouth 3 (three) times daily as needed for pain. (Patient not taking: Reported on 07/04/2015) 10 tablet 0  . phentermine 37.5 MG capsule Take 1 capsule (37.5 mg total) by mouth every morning. (Patient not taking: Reported on 07/04/2015) 30 capsule 2  . [DISCONTINUED] potassium chloride (KLOR-CON 10) 10 MEQ CR tablet Take 1 tablet (10 mEq total) by mouth daily. 90 tablet 3   No current facility-administered medications on file prior to visit.   Review of Systems  Constitutional: Negative for  unusual diaphoresis or night sweats HENT: Negative for ringing in ear or discharge Eyes: Negative for double vision or worsening visual disturbance.  Respiratory: Negative for choking and stridor.   Gastrointestinal: Negative for vomiting or other signifcant bowel change Genitourinary: Negative for hematuria or change in urine volume.  Musculoskeletal: Negative for other MSK pain or swelling Skin: Negative for color change and worsening wound.  Neurological: Negative for tremors and numbness other than noted  Psychiatric/Behavioral: Negative for decreased concentration or agitation other than above       Objective:   Physical Exam BP 132/86 mmHg  Pulse 83  Temp(Src) 98.3 F (36.8 C) (Oral)  Ht  (1.702 m)  Wt 293 lb (132.904 kg)  BMI 45.88 kg/m2  SpO2 95% VS noted,  Constitutional: Pt appears in no significant distress HENT: Head: NCAT.  Right Ear: External ear normal.  Left Ear: External ear normal.  Eyes: . Pupils are equal, round, and reactive to light. Conjunctivae and EOM are normal Neck: Normal range of motion. Neck supple.  Cardiovascular: Normal rate and regular rhythm.   Pulmonary/Chest: Effort normal and breath sounds without rales or wheezing.  Abd:  Soft, NT, ND, + BS Neurological: Pt is alert. Not confused , motor grossly intact Skin: Skin is warm. No rash, no LE edema Psychiatric: Pt behavior is normal. No agitation.     Assessment & Plan:

## 2015-07-04 NOTE — Patient Instructions (Signed)
Ok to stop the pravachol, and change to crestor 20 mg per day  Please continue all other medications as before, and refills have been done if requested - the percocet  OK to take the generic flomax as well  Please have the pharmacy call with any other refills you may need.  Please continue your efforts at being more active, low cholesterol diet, and weight control.  Please keep your appointments with your specialists as you may have planned  Please return in 6 months, or sooner if needed, with Lab testing done 3-5 days before

## 2015-07-06 NOTE — Assessment & Plan Note (Signed)
Uncontrolled,  Lab Results  Component Value Date   LDLCALC 98 01/02/2014   For change pravachol to crestor 20 mg for better LDL control, cont diet

## 2015-07-06 NOTE — Assessment & Plan Note (Signed)
stable overall by history and exam, recent data reviewed with pt, and pt to continue medical treatment as before,  to f/u any worsening symptoms or concerns BP Readings from Last 3 Encounters:  07/04/15 132/86  04/12/15 123/76  01/03/15 128/84

## 2015-07-06 NOTE — Assessment & Plan Note (Signed)
Mild to mod, for re-start flomax at generic,has f/u with urology planned, ,  to f/u any worsening symptoms or concerns

## 2015-07-30 ENCOUNTER — Telehealth: Payer: Self-pay | Admitting: Internal Medicine

## 2015-07-30 NOTE — Telephone Encounter (Signed)
[name] Spouse came by and dropped off a letter for Dr. Jonny RuizJohn to read and requested refills on Oxycodone /acetamin 5-325 mg tab & for Lorazepam (a very old script) 2 mg tab.  [name] Spouse stated pt's panic attacks are worsening.  When you call pt's Spouse, per her request, ONLY speak to her regarding the information in the letter she dropped off.  Letter was put in Dr. Raphael GibneyJohn's box for pick up.

## 2015-08-01 MED ORDER — LORAZEPAM 0.5 MG PO TABS: 0.5000 mg | ORAL_TABLET | Freq: Two times a day (BID) | ORAL | Status: DC | PRN

## 2015-08-01 MED ORDER — OXYCODONE-ACETAMINOPHEN 5-325 MG PO TABS: 1.0000 | ORAL_TABLET | Freq: Every day | ORAL | Status: DC | PRN

## 2015-08-01 NOTE — Telephone Encounter (Signed)
I have reviewed the letter  We will keep the information offered into his file  I normally do not have a role in the records being made available to [employer]; these are simply made available on request  i would not feel comfortable with increased lorazepam dosing as this can lead to addiction and withdrawal problems  Please let me know if I can refer to Psychiatry, as this could be helpful

## 2015-08-02 NOTE — Telephone Encounter (Signed)
[name] spouse advised in detail but requested an appt to discuss with Dr Jonny RuizJohn in person

## 2015-08-02 NOTE — Telephone Encounter (Signed)
Pt informed, Rx in cabinet for pt pick up  

## 2015-08-06 DIAGNOSIS — N401 Enlarged prostate with lower urinary tract symptoms: Secondary | ICD-10-CM | POA: Diagnosis not present

## 2015-08-06 DIAGNOSIS — R3914 Feeling of incomplete bladder emptying: Secondary | ICD-10-CM | POA: Diagnosis not present

## 2015-09-06 ENCOUNTER — Telehealth: Payer: Self-pay | Admitting: *Deleted

## 2015-09-06 MED ORDER — OXYCODONE-ACETAMINOPHEN 5-325 MG PO TABS: 1.0000 | ORAL_TABLET | Freq: Every day | ORAL | Status: DC | PRN

## 2015-09-06 NOTE — Telephone Encounter (Signed)
Received call pt is needing refill on his pain med Oxycodone...[name]/lmb

## 2015-09-06 NOTE — Telephone Encounter (Signed)
Done hardcopy to Dahlia  

## 2015-09-06 NOTE — Telephone Encounter (Signed)
Notified pt wife rx ready for pick-up../lmb 

## 2015-10-02 ENCOUNTER — Other Ambulatory Visit: Payer: Self-pay | Admitting: Internal Medicine

## 2015-10-02 MED ORDER — OXYCODONE-ACETAMINOPHEN 5-325 MG PO TABS: 1.0000 | ORAL_TABLET | Freq: Every day | ORAL | Status: DC | PRN

## 2015-10-02 NOTE — Telephone Encounter (Signed)
Done hardcopy to Corinne - to give to wife at her appt today

## 2015-10-11 ENCOUNTER — Other Ambulatory Visit: Payer: Self-pay | Admitting: Internal Medicine

## 2015-10-18 DIAGNOSIS — N359 Urethral stricture, unspecified: Secondary | ICD-10-CM | POA: Diagnosis not present

## 2015-10-18 DIAGNOSIS — N401 Enlarged prostate with lower urinary tract symptoms: Secondary | ICD-10-CM | POA: Diagnosis not present

## 2015-10-18 DIAGNOSIS — R3914 Feeling of incomplete bladder emptying: Secondary | ICD-10-CM | POA: Diagnosis not present

## 2015-10-18 DIAGNOSIS — Z0000 Encounter for general adult medical examination without abnormal findings: Secondary | ICD-10-CM | POA: Diagnosis not present

## 2015-10-18 DIAGNOSIS — R8271 Bacteriuria: Secondary | ICD-10-CM | POA: Diagnosis not present

## 2015-11-06 ENCOUNTER — Telehealth: Payer: Self-pay | Admitting: *Deleted

## 2015-11-06 NOTE — Telephone Encounter (Signed)
Wife left msg on triage pt needing refills on his Percocet & Lorazepam.../lmb

## 2015-11-07 MED ORDER — OXYCODONE-ACETAMINOPHEN 5-325 MG PO TABS: 1.0000 | ORAL_TABLET | Freq: Every day | ORAL | Status: DC | PRN

## 2015-11-07 MED ORDER — LORAZEPAM 0.5 MG PO TABS: 0.5000 mg | ORAL_TABLET | Freq: Two times a day (BID) | ORAL | Status: DC | PRN

## 2015-11-07 NOTE — Telephone Encounter (Signed)
Both Done hardcopy to Corinne ' 

## 2015-11-08 NOTE — Telephone Encounter (Signed)
Per corrine rx was picked up yesterday...[name]/LMB

## 2015-12-04 ENCOUNTER — Telehealth: Payer: Self-pay | Admitting: *Deleted

## 2015-12-04 MED ORDER — OXYCODONE-ACETAMINOPHEN 5-325 MG PO TABS: 1.0000 | ORAL_TABLET | Freq: Every day | ORAL | Status: DC | PRN

## 2015-12-04 NOTE — Telephone Encounter (Signed)
Requesting refill on his Oxycodone...lmb

## 2015-12-04 NOTE — Telephone Encounter (Signed)
Done hardcopy to Corinne  

## 2015-12-05 NOTE — Telephone Encounter (Signed)
Notified pt wife rx ready for pick-up../lmb 

## 2015-12-10 DIAGNOSIS — Z0000 Encounter for general adult medical examination without abnormal findings: Secondary | ICD-10-CM | POA: Diagnosis not present

## 2015-12-10 DIAGNOSIS — N401 Enlarged prostate with lower urinary tract symptoms: Secondary | ICD-10-CM | POA: Diagnosis not present

## 2015-12-10 DIAGNOSIS — R3914 Feeling of incomplete bladder emptying: Secondary | ICD-10-CM | POA: Diagnosis not present

## 2015-12-10 DIAGNOSIS — R3912 Poor urinary stream: Secondary | ICD-10-CM | POA: Diagnosis not present

## 2015-12-25 ENCOUNTER — Ambulatory Visit: Payer: Medicare HMO | Admitting: Pulmonary Disease

## 2016-01-02 ENCOUNTER — Other Ambulatory Visit (INDEPENDENT_AMBULATORY_CARE_PROVIDER_SITE_OTHER): Payer: Medicare HMO

## 2016-01-02 ENCOUNTER — Ambulatory Visit (INDEPENDENT_AMBULATORY_CARE_PROVIDER_SITE_OTHER): Payer: Medicare HMO | Admitting: Internal Medicine

## 2016-01-02 VITALS — BP 140/76 | HR 78 | Temp 98.8°F | Resp 20 | Wt 297.0 lb

## 2016-01-02 DIAGNOSIS — R6889 Other general symptoms and signs: Secondary | ICD-10-CM

## 2016-01-02 DIAGNOSIS — N183 Chronic kidney disease, stage 3 unspecified: Secondary | ICD-10-CM

## 2016-01-02 DIAGNOSIS — R7989 Other specified abnormal findings of blood chemistry: Secondary | ICD-10-CM

## 2016-01-02 DIAGNOSIS — I10 Essential (primary) hypertension: Secondary | ICD-10-CM

## 2016-01-02 DIAGNOSIS — Z0001 Encounter for general adult medical examination with abnormal findings: Secondary | ICD-10-CM | POA: Diagnosis not present

## 2016-01-02 DIAGNOSIS — E785 Hyperlipidemia, unspecified: Secondary | ICD-10-CM

## 2016-01-02 DIAGNOSIS — M545 Low back pain: Secondary | ICD-10-CM

## 2016-01-02 DIAGNOSIS — G8929 Other chronic pain: Secondary | ICD-10-CM

## 2016-01-02 LAB — CBC WITH DIFFERENTIAL/PLATELET
BASOS PCT: 0.5 % (ref 0.0–3.0)
Basophils Absolute: 0 10*3/uL (ref 0.0–0.1)
EOS ABS: 0.3 10*3/uL (ref 0.0–0.7)
Eosinophils Relative: 3.3 % (ref 0.0–5.0)
HCT: 43.8 % (ref 39.0–52.0)
Hemoglobin: 14.7 g/dL (ref 13.0–17.0)
Lymphocytes Relative: 20 % (ref 12.0–46.0)
Lymphs Abs: 1.6 10*3/uL (ref 0.7–4.0)
MCHC: 33.5 g/dL (ref 30.0–36.0)
MCV: 79.6 fl (ref 78.0–100.0)
Monocytes Absolute: 0.7 10*3/uL (ref 0.1–1.0)
Monocytes Relative: 9.1 % (ref 3.0–12.0)
Neutro Abs: 5.2 10*3/uL (ref 1.4–7.7)
Neutrophils Relative %: 67.1 % (ref 43.0–77.0)
Platelets: 232 10*3/uL (ref 150.0–400.0)
RBC: 5.5 Mil/uL (ref 4.22–5.81)
RDW: 13.9 % (ref 11.5–15.5)
WBC: 7.8 10*3/uL (ref 4.0–10.5)

## 2016-01-02 LAB — HEPATIC FUNCTION PANEL
ALT: 29 U/L (ref 0–53)
AST: 20 U/L (ref 0–37)
Albumin: 4.4 g/dL (ref 3.5–5.2)
Alkaline Phosphatase: 48 U/L (ref 39–117)
Bilirubin, Direct: 0.1 mg/dL (ref 0.0–0.3)
Total Bilirubin: 0.5 mg/dL (ref 0.2–1.2)
Total Protein: 6.9 g/dL (ref 6.0–8.3)

## 2016-01-02 LAB — URINALYSIS, ROUTINE W REFLEX MICROSCOPIC
BILIRUBIN URINE: NEGATIVE
HGB URINE DIPSTICK: NEGATIVE
KETONES UR: NEGATIVE
Leukocytes, UA: NEGATIVE
NITRITE: NEGATIVE
RBC / HPF: NONE SEEN (ref 0–?)
Specific Gravity, Urine: 1.015 (ref 1.000–1.030)
URINE GLUCOSE: NEGATIVE
UROBILINOGEN UA: 0.2 (ref 0.0–1.0)
pH: 6.5 (ref 5.0–8.0)

## 2016-01-02 LAB — LIPID PANEL
CHOL/HDL RATIO: 5
CHOLESTEROL: 206 mg/dL — AB (ref 0–200)
HDL: 39.4 mg/dL (ref >39.00)
NonHDL: 166.54
TRIGLYCERIDES: 206 mg/dL — AB (ref 0.0–149.0)
VLDL: 41.2 mg/dL — AB (ref 0.0–40.0)

## 2016-01-02 LAB — BASIC METABOLIC PANEL
BUN: 14 mg/dL (ref 6–23)
CHLORIDE: 101 meq/L (ref 96–112)
CO2: 31 mEq/L (ref 19–32)
CREATININE: 1.3 mg/dL (ref 0.40–1.50)
Calcium: 9.5 mg/dL (ref 8.4–10.5)
GFR: 62.4 mL/min (ref >60.00)
Glucose, Bld: 90 mg/dL (ref 70–99)
Potassium: 3.6 mEq/L (ref 3.5–5.1)
Sodium: 139 mEq/L (ref 135–145)

## 2016-01-02 LAB — TSH: TSH: 1.7 u[IU]/mL (ref 0.35–4.50)

## 2016-01-02 LAB — PSA: PSA: 1.15 ng/mL (ref 0.10–4.00)

## 2016-01-02 LAB — LDL CHOLESTEROL, DIRECT: LDL DIRECT: 139 mg/dL

## 2016-01-02 MED ORDER — DOXAZOSIN MESYLATE 4 MG PO TABS: 4.0000 mg | ORAL_TABLET | Freq: Every day | ORAL | Status: DC

## 2016-01-02 MED ORDER — LORAZEPAM 0.5 MG PO TABS: 0.5000 mg | ORAL_TABLET | Freq: Two times a day (BID) | ORAL | Status: DC | PRN

## 2016-01-02 MED ORDER — OXYCODONE-ACETAMINOPHEN 5-325 MG PO TABS: 1.0000 | ORAL_TABLET | Freq: Every day | ORAL | Status: DC | PRN

## 2016-01-02 NOTE — Progress Notes (Signed)
Pre visit review using our clinic review tool, if applicable. No additional management support is needed unless otherwise documented below in the visit note. 

## 2016-01-02 NOTE — Progress Notes (Signed)
Subjective:    Patient ID: Darren Black, male    DOB: 15-Jun-1966, 50 y.o.   MRN: [id number]  HPI  Here for wellness and f/u;  Overall doing ok;  Pt denies Chest pain, worsening SOB, DOE, wheezing, orthopnea, PND, worsening LE edema, palpitations, dizziness or syncope.  Pt denies neurological change such as new headache, facial or extremity weakness.  Pt denies polydipsia, polyuria, or low sugar symptoms. Pt states overall good compliance with treatment and medications, good tolerability, and has been trying to follow appropriate diet.  Pt denies worsening depressive symptoms, suicidal ideation or panic. No fever, night sweats, wt loss, loss of appetite, or other constitutional symptoms.  Pt states good ability with ADL's, has low fall risk, home safety reviewed and adequate, no other significant changes in hearing or vision, and only occasionally active with exercise.  Has seen urology recenlty, with stoping flomax due to rash, also stop the avodart, and to start now dozosin 4 mg qhs.  Declines tetanus today.  Crestor too expensive, so not taking, trying to follow lower chol diet Pt continues to have recurring LBP without change in severity, bowel or bladder change, fever, wt loss,  worsening LE pain/numbness/weakness, gait change or falls. Past Medical History  Diagnosis Date  . Chronic lower back pain     nerve damage w/ leg pain  . Anxiety   . Depression   . Hyperlipidemia   . Meatal stenosis   . PONV (postoperative nausea and vomiting)   . Family history of adverse reaction to anesthesia     MOTHER--- HARD TO WAKE  . OSA on CPAP     moderate to severe OSA  per study 04-28-2007 uses CPAP  . Hypertension   . History of panic attacks   . BPH (benign prostatic hypertrophy)    Past Surgical History  Procedure Laterality Date  . Breath tek h pylori  04/27/2012    Procedure: BREATH TEK H PYLORI;  Surgeon: Valarie MerinoMatthew B Martin, MD;  [facility];  Service: General;  Laterality: N/A;  .  Rotator cuff repair Right 01-21-2012  . Appendectomy  2003  . Transthoracic echocardiogram  03-26-2007    normal LVSF, ef 60%,  mild AV calcification without stenosis,  mild MR and TR,  mild LAE  . Cystoscopy with urethral dilatation N/A 04/12/2015    Procedure: CYSTOSCOPY WITH URETHRAL DILATATION, RETROGRADE AND URETHROGRAM WITH BLADDER BIOPSY;  Surgeon: Bjorn PippinJohn Wrenn, MD;  [facility];  Service: Urology;  Laterality: N/A;  . Urethrogram N/A 04/12/2015    Procedure: URETHROGRAM;  Surgeon: Bjorn PippinJohn Wrenn, MD;  [facility];  Service: Urology;  Laterality: N/A;    reports that he has never smoked. He has never used smokeless tobacco. He reports that he drinks alcohol. He reports that he does not use illicit drugs. family history includes Cancer in his father; Coronary artery disease in his father; Diabetes in his other; Hyperlipidemia in his other; Hypertension in his other; Stroke in his other. Allergies  Allergen Reactions  . Flomax [Tamsulosin Hcl] Rash   Current Outpatient Prescriptions on File Prior to Visit  Medication Sig Dispense Refill  . amLODipine (NORVASC) 10 MG tablet Take 5 mg by mouth every morning.    . benazepril (LOTENSIN) 40 MG tablet Take 1 tablet (40 mg total) by mouth daily. 90 tablet 3  . DULoxetine (CYMBALTA) 60 MG capsule Take 1 capsule (60 mg total) by mouth daily. (Patient taking differently: Take 60 mg by mouth  every morning. ) 90 capsule 3  . hydrochlorothiazide (HYDRODIURIL) 25 MG tablet Take 0.5 tablets (12.5 mg total) by mouth daily. 90 tablet 3  . KLOR-CON M20 20 MEQ tablet TAKE ONE-HALF TABLET BY MOUTH ONCE DAILY (Patient taking differently: TAKE ONE-HALF TABLET BY MOUTH ONCE DAILY--  TAKES IN AM) 45 tablet 3  . LORazepam (ATIVAN) 0.5 MG tablet Take 1 tablet (0.5 mg total) by mouth 2 (two) times daily as needed for anxiety. 60 tablet 2  . Lycopene 10 MG CAPS Take 1 capsule by mouth every evening.    . Omega 3-6-9 Fatty  Acids (OMEGA 3-6-9 COMPLEX PO) Take 1 capsule by mouth daily.     Marland Kitchen oxyCODONE-acetaminophen (PERCOCET/ROXICET) 5-325 MG tablet Take 1 tablet by mouth daily as needed for severe pain. 30 tablet 0  . phenazopyridine (PYRIDIUM) 200 MG tablet Take 1 tablet (200 mg total) by mouth 3 (three) times daily as needed for pain. 10 tablet 0  . pravastatin (PRAVACHOL) 40 MG tablet TAKE ONE TABLET BY MOUTH ONCE DAILY 90 tablet 0  . rosuvastatin (CRESTOR) 20 MG tablet Take 1 tablet (20 mg total) by mouth daily. 90 tablet 3  . [DISCONTINUED] potassium chloride (KLOR-CON 10) 10 MEQ CR tablet Take 1 tablet (10 mEq total) by mouth daily. 90 tablet 3   No current facility-administered medications on file prior to visit.    Review of Systems Constitutional: Negative for increased diaphoresis, or other activity, appetite or siginficant weight change other than noted HENT: Negative for worsening hearing loss, ear pain, facial swelling, mouth sores and neck stiffness.   Eyes: Negative for other worsening pain, redness or visual disturbance.  Respiratory: Negative for choking or stridor Cardiovascular: Negative for other chest pain and palpitations.  Gastrointestinal: Negative for worsening diarrhea, blood in stool, or abdominal distention Genitourinary: Negative for hematuria, flank pain or change in urine volume.  Musculoskeletal: Negative for myalgias or other joint complaints.  Skin: Negative for other color change and wound or drainage.  Neurological: Negative for syncope and numbness. other than noted Hematological: Negative for adenopathy. or other swelling Psychiatric/Behavioral: Negative for hallucinations, SI, self-injury, decreased concentration or other worsening agitation.      Objective:   Physical Exam BP 140/76 mmHg  Pulse 78  Temp(Src) 98.8 F (37.1 C) (Oral)  Resp 20  Wt 297 lb (134.718 kg)  SpO2 93% VS noted,  Constitutional: Pt is oriented to person, place, and time. Appears  well-developed and well-nourished, in no significant distress Head: Normocephalic and atraumatic  Eyes: Conjunctivae and EOM are normal. Pupils are equal, round, and reactive to light Right Ear: External ear normal.  Left Ear: External ear normal Nose: Nose normal.  Mouth/Throat: Oropharynx is clear and moist  Neck: Normal range of motion. Neck supple. No JVD present. No tracheal deviation present or significant neck LA or mass Cardiovascular: Normal rate, regular rhythm, normal heart sounds and intact distal pulses  Pulmonary/Chest: Effort normal and breath sounds without rales or wheezing  Abdominal: Soft. Bowel sounds are normal. NT. No HSM  Musculoskeletal: Normal range of motion. Exhibits no edema Lymphadenopathy: Has no cervical adenopathy.  Neurological: Pt is alert and oriented to person, place, and time. Pt has normal reflexes. No cranial nerve deficit. Motor grossly intact Skin: Skin is warm and dry. No rash noted or new ulcers Psychiatric:  Has normal mood and affect. Behavior is normal.      Assessment & Plan:

## 2016-01-02 NOTE — Patient Instructions (Signed)
Please continue all other medications as before, and refills have been done if requested - the percocet and ativan  Please have the pharmacy call with any other refills you may need.  Please continue your efforts at being more active, low cholesterol diet, and weight control.  You are otherwise up to date with prevention measures today.  Please keep your appointments with your specialists as you may have planned  Please go to the LAB in the Basement (turn left off the elevator) for the tests to be done today  You will be contacted by phone if any changes need to be made immediately.  Otherwise, you will receive a letter about your results with an explanation, but please check with MyChart first.  Please remember to sign up for MyChart if you have not done so, as this will be important to you in the future with finding out test results, communicating by private email, and scheduling acute appointments online when needed.  Please return in 6 months, or sooner if needed

## 2016-01-05 NOTE — Assessment & Plan Note (Signed)
stable overall by history and exam, recent data reviewed with pt, and pt to continue medical treatment as before,  to f/u any worsening symptoms or concerns Lab Results  Component Value Date   LDLCALC 98 01/02/2014

## 2016-01-05 NOTE — Assessment & Plan Note (Signed)

## 2016-01-05 NOTE — Assessment & Plan Note (Signed)
stable overall by history and exam,  and pt to continue medical treatment as before,  to f/u any worsening symptoms or concerns, for med refill 

## 2016-01-05 NOTE — Assessment & Plan Note (Addendum)
stable overall by history and exam, recent data reviewed with pt, and pt to continue medical treatment as before,  to f/u any worsening symptoms or concerns BP Readings from Last 3 Encounters:  01/02/16 140/76  07/04/15 132/86  04/12/15 123/76   In addition to the time spent performing CPE, I spent an additional 15 minutes face to face,in which greater than 50% of this time was spent in counseling and coordination of care for patient's illness as documented.

## 2016-01-05 NOTE — Assessment & Plan Note (Signed)
stable overall by history and exam, recent data reviewed with pt, and pt to continue medical treatment as before,  to f/u any worsening symptoms or concerns Lab Results  Component Value Date   CREATININE 1.30 01/02/2016

## 2016-01-08 ENCOUNTER — Telehealth: Payer: Self-pay

## 2016-01-08 NOTE — Telephone Encounter (Signed)
Faxed OV notes and PAF back to [facility].

## 2016-01-17 ENCOUNTER — Encounter: Payer: Self-pay | Admitting: Pulmonary Disease

## 2016-01-17 ENCOUNTER — Ambulatory Visit (INDEPENDENT_AMBULATORY_CARE_PROVIDER_SITE_OTHER): Payer: Medicare HMO | Admitting: Pulmonary Disease

## 2016-01-17 VITALS — BP 128/90 | HR 77 | Ht 67.0 in | Wt 298.6 lb

## 2016-01-17 DIAGNOSIS — I10 Essential (primary) hypertension: Secondary | ICD-10-CM

## 2016-01-17 DIAGNOSIS — G4733 Obstructive sleep apnea (adult) (pediatric): Secondary | ICD-10-CM | POA: Diagnosis not present

## 2016-01-17 NOTE — Progress Notes (Signed)
   Subjective:    Patient ID: Darren ShearerJohn Black, male    DOB: 05-Apr-1966, 50 y.o.   MRN: [id number]  HPI  49/M , originally from from [place] for FU of severe obstructive sleep apnea .  PSG in 2008 showed AHI 29/h, wt was 252 lbs, CPAP was initiated at 12 cm based on autotitration data, then lowered to 10 cm    [facility] to [facility] , since it is cheaper for him to obtain supplies.    01/17/2016  Chief Complaint  Patient presents with  . Follow-up    Pt states doing well with CPAP- denies any problems with mask or machine. He is averaging 8 hrs of sleep every night with CPAP.      Got new CPAP in 2015  He has good results with CPAP-sleeps 6-8 hours sometimes even 10 hours and wakes up feeling rested denies daytime fatigue or somnolence, wife has not noted any snoring  Download confirms good usage and no residuals on 10 cm no significant leak  His weight is unchanged and in fact he is gained about 40 pounds since his sleep study  On 3 meds for BP  Takes ativan 3-4 times/ wk for anxiety   His insurance has changed and he is having to pay more for out of network DME  Review of Systems neg for any significant sore throat, dysphagia, itching, sneezing, nasal congestion or excess/ purulent secretions, fever, chills, sweats, unintended wt loss, pleuritic or exertional cp, hempoptysis, orthopnea pnd or change in chronic leg swelling. Also denies presyncope, palpitations, heartburn, abdominal pain, nausea, vomiting, diarrhea or change in bowel or urinary habits, dysuria,hematuria, rash, arthralgias, visual complaints, headache, numbness weakness or ataxia.     Objective:   Physical Exam  Gen. Pleasant, obese, in no distress ENT - no lesions, no post nasal drip Neck: No JVD, no thyromegaly, no carotid bruits Lungs: no use of accessory muscles, no dullness to percussion, decreased without rales or rhonchi  Cardiovascular: Rhythm regular, heart sounds  normal, no  murmurs or gallops, no peripheral edema Musculoskeletal: No deformities, no cyanosis or clubbing , no tremors       Assessment & Plan:

## 2016-01-17 NOTE — Assessment & Plan Note (Signed)
CPAP machine is set at 10 cm and is very effective Weight loss is encouraged We will set you up with his supplier within  Network  Weight loss encouraged, compliance with goal of at least 4-6 hrs every night is the expectation. Advised against medications with sedative side effects Cautioned against driving when sleepy - understanding that sleepiness will vary on a day to day basis

## 2016-01-17 NOTE — Patient Instructions (Signed)
Your CPAP machine is set at 10 cm and is very effective Weight loss is encouraged We will set you up with his supplier within  network

## 2016-01-18 NOTE — Assessment & Plan Note (Signed)
Benefits of CPAP discussed 

## 2016-01-28 ENCOUNTER — Other Ambulatory Visit: Payer: Self-pay | Admitting: Internal Medicine

## 2016-02-04 ENCOUNTER — Telehealth: Payer: Self-pay | Admitting: *Deleted

## 2016-02-04 MED ORDER — OXYCODONE-ACETAMINOPHEN 5-325 MG PO TABS: 1.0000 | ORAL_TABLET | Freq: Every day | ORAL | Status: DC | PRN

## 2016-02-04 NOTE — Telephone Encounter (Signed)
Notified pt wife rx ready for pick-up../lmb 

## 2016-02-04 NOTE — Telephone Encounter (Signed)
done

## 2016-02-04 NOTE — Telephone Encounter (Signed)
Receive call pt is requesting renewal on his Oxycodone. Last filled 01/02/2016. MD is out of the office this week pls advise on refill...[name]/lmb

## 2016-02-06 ENCOUNTER — Other Ambulatory Visit: Payer: Self-pay | Admitting: Internal Medicine

## 2016-03-05 ENCOUNTER — Telehealth: Payer: Self-pay | Admitting: *Deleted

## 2016-03-05 MED ORDER — OXYCODONE-ACETAMINOPHEN 5-325 MG PO TABS: 1.0000 | ORAL_TABLET | Freq: Every day | ORAL | Status: DC | PRN

## 2016-03-05 NOTE — Telephone Encounter (Signed)
Done hardcopy to Corinne  

## 2016-03-05 NOTE — Telephone Encounter (Signed)
Medication placed up front for pick up patient is aware

## 2016-03-05 NOTE — Telephone Encounter (Signed)
Wife left msg on triage pt needing refill on his pian med Oxycodone...[name]/lmb

## 2016-04-01 ENCOUNTER — Telehealth: Payer: Self-pay | Admitting: *Deleted

## 2016-04-01 MED ORDER — OXYCODONE-ACETAMINOPHEN 5-325 MG PO TABS: 1.0000 | ORAL_TABLET | Freq: Every day | ORAL | 0 refills | Status: DC | PRN

## 2016-04-01 NOTE — Telephone Encounter (Signed)
Done hardcopy to Corinne  

## 2016-04-01 NOTE — Telephone Encounter (Signed)
Req refill on his pain med "Oxycodone".../lmb 

## 2016-04-02 NOTE — Telephone Encounter (Signed)
Medication placed up front, patient aware

## 2016-04-08 DIAGNOSIS — R3121 Asymptomatic microscopic hematuria: Secondary | ICD-10-CM | POA: Diagnosis not present

## 2016-04-08 DIAGNOSIS — R3914 Feeling of incomplete bladder emptying: Secondary | ICD-10-CM | POA: Diagnosis not present

## 2016-04-23 ENCOUNTER — Other Ambulatory Visit: Payer: Self-pay | Admitting: Internal Medicine

## 2016-05-02 ENCOUNTER — Telehealth: Payer: Self-pay | Admitting: *Deleted

## 2016-05-02 MED ORDER — OXYCODONE-ACETAMINOPHEN 5-325 MG PO TABS: 1.0000 | ORAL_TABLET | Freq: Every day | ORAL | 0 refills | Status: DC | PRN

## 2016-05-02 MED ORDER — LORAZEPAM 0.5 MG PO TABS: 0.5000 mg | ORAL_TABLET | Freq: Two times a day (BID) | ORAL | 5 refills | Status: DC | PRN

## 2016-05-02 MED ORDER — DULOXETINE HCL 60 MG PO CPEP: 60.0000 mg | ORAL_CAPSULE | Freq: Every morning | ORAL | 2 refills | Status: DC

## 2016-05-02 NOTE — Telephone Encounter (Signed)
Both Done hardcopy to Corinne ' 

## 2016-05-02 NOTE — Telephone Encounter (Signed)
Notified pt wife rx's are ready for pick=up. Wife also states forgot to mention he is also needing rx for cymbalta fax to pharmacy [place] @ [phone]. Inform wife we will fax rx...[name]/lmb

## 2016-05-02 NOTE — Telephone Encounter (Signed)
Wife left msg on triage stating needing a refill on husband Darren Black & Oxycodone...[name]/lmb

## 2016-06-02 ENCOUNTER — Telehealth: Payer: Self-pay | Admitting: *Deleted

## 2016-06-02 NOTE — Telephone Encounter (Signed)
Left msg on triage requesting refill on husband Oxycodone.MD is out of office today will hold until he return tomorrow for response....[name]/lmb

## 2016-06-03 MED ORDER — OXYCODONE-ACETAMINOPHEN 5-325 MG PO TABS: 1.0000 | ORAL_TABLET | Freq: Every day | ORAL | 0 refills | Status: DC | PRN

## 2016-06-03 NOTE — Telephone Encounter (Signed)
Notified pt wife rx ready for pick-up../lmb 

## 2016-06-03 NOTE — Telephone Encounter (Signed)
Done hardcopy to Corinne  

## 2016-06-16 DIAGNOSIS — N401 Enlarged prostate with lower urinary tract symptoms: Secondary | ICD-10-CM | POA: Diagnosis not present

## 2016-06-16 DIAGNOSIS — R3914 Feeling of incomplete bladder emptying: Secondary | ICD-10-CM | POA: Diagnosis not present

## 2016-06-16 DIAGNOSIS — N359 Urethral stricture, unspecified: Secondary | ICD-10-CM | POA: Diagnosis not present

## 2016-06-30 ENCOUNTER — Other Ambulatory Visit: Payer: Self-pay | Admitting: *Deleted

## 2016-06-30 ENCOUNTER — Telehealth: Payer: Self-pay | Admitting: *Deleted

## 2016-06-30 NOTE — Telephone Encounter (Signed)
Wife left msg on triage req refill on husband Hydrocodone. MD out of office today will hold until return back in office tomorrow for approval.../lmb

## 2016-07-01 MED ORDER — OXYCODONE-ACETAMINOPHEN 5-325 MG PO TABS: 1.0000 | ORAL_TABLET | Freq: Every day | ORAL | 0 refills | Status: DC | PRN

## 2016-07-01 NOTE — Telephone Encounter (Signed)
Called pt wife no answer LMOM rx ready for pick-up.../lmb 

## 2016-07-01 NOTE — Telephone Encounter (Signed)
Done hardcopy to Corinne  

## 2016-07-08 ENCOUNTER — Other Ambulatory Visit (INDEPENDENT_AMBULATORY_CARE_PROVIDER_SITE_OTHER): Payer: Medicare HMO

## 2016-07-08 ENCOUNTER — Encounter: Payer: Self-pay | Admitting: Internal Medicine

## 2016-07-08 ENCOUNTER — Ambulatory Visit (INDEPENDENT_AMBULATORY_CARE_PROVIDER_SITE_OTHER): Payer: Medicare HMO | Admitting: Internal Medicine

## 2016-07-08 VITALS — BP 138/78 | HR 81 | Temp 98.3°F | Resp 20 | Wt 282.0 lb

## 2016-07-08 DIAGNOSIS — I10 Essential (primary) hypertension: Secondary | ICD-10-CM

## 2016-07-08 DIAGNOSIS — Z0001 Encounter for general adult medical examination with abnormal findings: Secondary | ICD-10-CM

## 2016-07-08 DIAGNOSIS — E785 Hyperlipidemia, unspecified: Secondary | ICD-10-CM

## 2016-07-08 DIAGNOSIS — R972 Elevated prostate specific antigen [PSA]: Secondary | ICD-10-CM | POA: Diagnosis not present

## 2016-07-08 DIAGNOSIS — N183 Chronic kidney disease, stage 3 unspecified: Secondary | ICD-10-CM

## 2016-07-08 LAB — BASIC METABOLIC PANEL
BUN: 12 mg/dL (ref 6–23)
CHLORIDE: 100 meq/L (ref 96–112)
CO2: 33 mEq/L — ABNORMAL HIGH (ref 19–32)
Calcium: 9.6 mg/dL (ref 8.4–10.5)
Creatinine, Ser: 1.37 mg/dL (ref 0.40–1.50)
GFR: 58.61 mL/min — AB (ref >60.00)
GLUCOSE: 113 mg/dL — AB (ref 70–99)
POTASSIUM: 3.4 meq/L — AB (ref 3.5–5.1)
SODIUM: 139 meq/L (ref 135–145)

## 2016-07-08 LAB — HEPATIC FUNCTION PANEL
ALT: 18 U/L (ref 0–53)
AST: 16 U/L (ref 0–37)
Albumin: 4.4 g/dL (ref 3.5–5.2)
Alkaline Phosphatase: 42 U/L (ref 39–117)
BILIRUBIN DIRECT: 0.1 mg/dL (ref 0.0–0.3)
BILIRUBIN TOTAL: 0.6 mg/dL (ref 0.2–1.2)
Total Protein: 6.8 g/dL (ref 6.0–8.3)

## 2016-07-08 LAB — PSA: PSA: 1.27 ng/mL (ref 0.10–4.00)

## 2016-07-08 LAB — LIPID PANEL
CHOL/HDL RATIO: 4
Cholesterol: 181 mg/dL (ref 0–200)
HDL: 47.5 mg/dL (ref >39.00)
LDL CALC: 101 mg/dL — AB (ref 0–99)
NonHDL: 133.93
Triglycerides: 164 mg/dL — ABNORMAL HIGH (ref 0.0–149.0)
VLDL: 32.8 mg/dL (ref 0.0–40.0)

## 2016-07-08 NOTE — Patient Instructions (Signed)

## 2016-07-08 NOTE — Progress Notes (Signed)
Pre visit review using our clinic review tool, if applicable. No additional management support is needed unless otherwise documented below in the visit note. 

## 2016-07-08 NOTE — Progress Notes (Signed)
Subjective:    Patient ID: Darren Black, male    DOB: 01-30-1966, 50 y.o.   MRN: [id number]  HPI Here to f/u; overall doing ok,  Pt denies chest pain, increasing sob or doe, wheezing, orthopnea, PND, increased LE swelling, palpitations, dizziness or syncope.  Pt denies new neurological symptoms such as new headache, or facial or extremity weakness or numbness.  Pt denies polydipsia, polyuria, or low sugar episode.   Pt denies new neurological symptoms such as new headache, or facial or extremity weakness or numbness.   Pt states overall good compliance with meds, mostly trying to follow appropriate diet, with wt overall stable,  but little exercise however. No other significant changes  Wt down with better diet and more active. Wt Readings from Last 3 Encounters:  07/08/16 282 lb (127.9 kg)  01/17/16 298 lb 9.6 oz (135.4 kg)  01/02/16 297 lb (134.7 kg)  Declines flu shot.  Denies urinary symptoms such as dysuria, frequency, urgency, flank pain, hematuria or n/v, fever, chills. Past Medical History:  Diagnosis Date  . Anxiety   . BPH (benign prostatic hypertrophy)   . Chronic lower back pain    nerve damage w/ leg pain  . Depression   . Family history of adverse reaction to anesthesia    MOTHER--- HARD TO WAKE  . History of panic attacks   . Hyperlipidemia   . Hypertension   . Meatal stenosis   . OSA on CPAP    moderate to severe OSA  per study 04-28-2007 uses CPAP  . PONV (postoperative nausea and vomiting)    Past Surgical History:  Procedure Laterality Date  . APPENDECTOMY  2003  . BREATH TEK H PYLORI  04/27/2012   Procedure: BREATH TEK H PYLORI;  Surgeon: Valarie MerinoMatthew B Martin, MD;  [facility];  Service: General;  Laterality: N/A;  . CYSTOSCOPY WITH URETHRAL DILATATION N/A 04/12/2015   Procedure: CYSTOSCOPY WITH URETHRAL DILATATION, RETROGRADE AND URETHROGRAM WITH BLADDER BIOPSY;  Surgeon: Darren PippinJohn Wrenn, MD;  [facility];  Service: Urology;   Laterality: N/A;  . ROTATOR CUFF REPAIR Right 01-21-2012  . TRANSTHORACIC ECHOCARDIOGRAM  03-26-2007   normal LVSF, ef 60%,  mild AV calcification without stenosis,  mild MR and TR,  mild LAE  . URETHROGRAM N/A 04/12/2015   Procedure: URETHROGRAM;  Surgeon: Darren PippinJohn Wrenn, MD;  [facility];  Service: Urology;  Laterality: N/A;    reports that he has never smoked. He has never used smokeless tobacco. He reports that he drinks alcohol. He reports that he does not use drugs. family history includes Cancer in his father; Coronary artery disease in his father; Diabetes in his other; Hyperlipidemia in his other; Hypertension in his other; Stroke in his other. Allergies  Allergen Reactions  . Flomax [Tamsulosin Hcl] Rash   Current Outpatient Prescriptions on File Prior to Visit  Medication Sig Dispense Refill  . amLODipine (NORVASC) 10 MG tablet Take 5 mg by mouth every morning.    Marland Kitchen. amLODipine (NORVASC) 5 MG tablet TAKE ONE TABLET BY MOUTH ONCE DAILY 90 tablet 3  . benazepril (LOTENSIN) 40 MG tablet Take 1 tablet (40 mg total) by mouth daily. 90 tablet 3  . doxazosin (CARDURA) 4 MG tablet Take 1 tablet (4 mg total) by mouth at bedtime. 90 tablet 3  . DULoxetine (CYMBALTA) 60 MG capsule Take 1 capsule (60 mg total) by mouth every morning. 90 capsule 2  . hydrochlorothiazide (HYDRODIURIL) 25 MG tablet Take 0.5 tablets (  12.5 mg total) by mouth daily. 90 tablet 3  . KLOR-CON M20 20 MEQ tablet TAKE ONE-HALF TABLET BY MOUTH ONCE DAILY 45 tablet 3  . LORazepam (ATIVAN) 0.5 MG tablet Take 1 tablet (0.5 mg total) by mouth 2 (two) times daily as needed for anxiety. To fill sept 2, 2017 60 tablet 5  . Lycopene 10 MG CAPS Take 1 capsule by mouth every evening.    . Omega 3-6-9 Fatty Acids (OMEGA 3-6-9 COMPLEX PO) Take 1 capsule by mouth daily.     Marland Kitchen. oxyCODONE-acetaminophen (PERCOCET/ROXICET) 5-325 MG tablet Take 1 tablet by mouth daily as needed for severe pain. To fill Jul 03, 2016 30  tablet 0  . pravastatin (PRAVACHOL) 40 MG tablet TAKE ONE TABLET BY MOUTH ONCE DAILY 90 tablet 0  . [DISCONTINUED] potassium chloride (KLOR-CON 10) 10 MEQ CR tablet Take 1 tablet (10 mEq total) by mouth daily. 90 tablet 3   No current facility-administered medications on file prior to visit.    Review of Systems  Constitutional: Negative for unusual diaphoresis or night sweats HENT: Negative for ear swelling or discharge Eyes: Negative for worsening visual haziness  Respiratory: Negative for choking and stridor.   Gastrointestinal: Negative for distension or worsening eructation Genitourinary: Negative for retention or change in urine volume.  Musculoskeletal: Negative for other MSK pain or swelling Skin: Negative for color change and worsening wound Neurological: Negative for tremors and numbness other than noted  Psychiatric/Behavioral: Negative for decreased concentration or agitation other than above   All other system neg per pt    Objective:   Physical Exam BP 138/78   Pulse 81   Temp 98.3 F (36.8 C) (Oral)   Resp 20   Wt 282 lb (127.9 kg)   SpO2 95%   BMI 44.17 kg/m  VS noted,  Constitutional: Pt appears in no apparent distress HENT: Head: NCAT.  Right Ear: External ear normal.  Left Ear: External ear normal.  Eyes: . Pupils are equal, round, and reactive to light. Conjunctivae and EOM are normal Neck: Normal range of motion. Neck supple.  Cardiovascular: Normal rate and regular rhythm.   Pulmonary/Chest: Effort normal and breath sounds without rales or wheezing.  Neurological: Pt is alert. Not confused , motor grossly intact Skin: Skin is warm. No rash, no LE edema Psychiatric: Pt behavior is normal. No agitation.  No other exam changes noted    Assessment & Plan:

## 2016-07-14 NOTE — Assessment & Plan Note (Signed)
Asympt, also for recheck psa,  Lab Results  Component Value Date   PSA 1.27 07/08/2016   PSA 1.15 01/02/2016   PSA 0.77 01/03/2015

## 2016-07-14 NOTE — Assessment & Plan Note (Signed)
stable overall by history and exam, recent data reviewed with pt, and pt to continue medical treatment as before,  to f/u any worsening symptoms or concerns Lab Results  Component Value Date   LDLCALC 101 (H) 07/08/2016

## 2016-07-14 NOTE — Assessment & Plan Note (Signed)
stable overall by history and exam, recent data reviewed with pt, and pt to continue medical treatment as before,  to f/u any worsening symptoms or concerns BP Readings from Last 3 Encounters:  07/08/16 138/78  01/17/16 128/90  01/02/16 140/76

## 2016-07-14 NOTE — Assessment & Plan Note (Signed)
Lab Results  Component Value Date   CREATININE 1.37 07/08/2016   stable overall by history and exam, recent data reviewed with pt, and pt to continue medical treatment as before,  to f/u any worsening symptoms or concerns

## 2016-07-21 ENCOUNTER — Telehealth: Payer: Self-pay | Admitting: Emergency Medicine

## 2016-07-21 NOTE — Telephone Encounter (Signed)
Pts wife came in and stated patient needs a prescription refill on his hydrochlorothiazide (HYDRODIURIL) 25 MG tablet. Pharmacy [facility] Please advise thanks.

## 2016-07-22 ENCOUNTER — Telehealth: Payer: Self-pay

## 2016-07-22 MED ORDER — HYDROCHLOROTHIAZIDE 25 MG PO TABS: 12.5000 mg | ORAL_TABLET | Freq: Every day | ORAL | 3 refills | Status: DC

## 2016-07-22 NOTE — Telephone Encounter (Signed)
Prescription sent

## 2016-07-31 ENCOUNTER — Telehealth: Payer: Self-pay | Admitting: Internal Medicine

## 2016-07-31 MED ORDER — OXYCODONE-ACETAMINOPHEN 5-325 MG PO TABS: 1.0000 | ORAL_TABLET | Freq: Every day | ORAL | 0 refills | Status: DC | PRN

## 2016-07-31 MED ORDER — BENAZEPRIL HCL 40 MG PO TABS: 40.0000 mg | ORAL_TABLET | Freq: Every day | ORAL | 3 refills | Status: DC

## 2016-07-31 MED ORDER — PRAVASTATIN SODIUM 40 MG PO TABS: 40.0000 mg | ORAL_TABLET | Freq: Every day | ORAL | 3 refills | Status: DC

## 2016-07-31 NOTE — Telephone Encounter (Signed)
Percocet Done hardcopy to Corinne   Other 2 done erx to new pharmacy

## 2016-07-31 NOTE — Telephone Encounter (Signed)
[facility] [address]-- please change--  oxyCODONE-acetaminophen (PERCOCET/ROXICET) 5-325 MG tablet [[id number]][[id number]]   pravastatin (PRAVACHOL) 40 MG tablet [[id number]][[id number]]   benazepril (LOTENSIN) 40 MG tablet [[id number]][[id number]]   Patient is requesting these 3 be refilled. Patient is out of medication.

## 2016-07-31 NOTE — Telephone Encounter (Signed)
Placed up front for pick up patient aware 

## 2016-08-28 ENCOUNTER — Telehealth: Payer: Self-pay | Admitting: *Deleted

## 2016-08-28 MED ORDER — OXYCODONE-ACETAMINOPHEN 5-325 MG PO TABS: 1.0000 | ORAL_TABLET | Freq: Every day | ORAL | 0 refills | Status: DC | PRN

## 2016-08-28 NOTE — Telephone Encounter (Signed)
Done hardcopy to Corinne  

## 2016-08-28 NOTE — Telephone Encounter (Signed)
Rec'd call from pt wife requesting refill on his Oxycodone...[name]/lmb

## 2016-08-28 NOTE — Telephone Encounter (Signed)
Notified pt wife rx ready for pick-up../lmb 

## 2016-09-29 ENCOUNTER — Telehealth: Payer: Self-pay | Admitting: *Deleted

## 2016-09-29 NOTE — Telephone Encounter (Signed)
Rec'd call wife requesting refill on husband Oxycodone. MD out of office today will hold for approval when he return tomorrow,.../lmb

## 2016-09-30 MED ORDER — OXYCODONE-ACETAMINOPHEN 5-325 MG PO TABS: 1.0000 | ORAL_TABLET | Freq: Every day | ORAL | 0 refills | Status: DC | PRN

## 2016-09-30 NOTE — Telephone Encounter (Signed)
Placed up front for pickup ° °

## 2016-09-30 NOTE — Telephone Encounter (Signed)
Done hardcopy to Corinne  

## 2016-10-24 ENCOUNTER — Telehealth: Payer: Self-pay

## 2016-10-24 ENCOUNTER — Telehealth: Payer: Self-pay | Admitting: Internal Medicine

## 2016-10-24 MED ORDER — OXYCODONE-ACETAMINOPHEN 5-325 MG PO TABS: 1.0000 | ORAL_TABLET | Freq: Every day | ORAL | 0 refills | Status: DC | PRN

## 2016-10-24 NOTE — Telephone Encounter (Signed)
Pt spouse lmom for rf of oxycodone. Please advise.

## 2016-10-24 NOTE — Telephone Encounter (Signed)
Called and talk pt wife Rx oxycodone ready to pick up front desk

## 2016-10-24 NOTE — Telephone Encounter (Signed)
Done hardcopy to Anna  

## 2016-11-04 ENCOUNTER — Other Ambulatory Visit: Payer: Self-pay | Admitting: Internal Medicine

## 2016-11-04 NOTE — Telephone Encounter (Signed)
Done hardcopy to Anna  

## 2016-11-05 NOTE — Telephone Encounter (Signed)
[facility]...[name]/lmb

## 2016-11-24 ENCOUNTER — Telehealth: Payer: Self-pay | Admitting: *Deleted

## 2016-11-24 NOTE — Telephone Encounter (Signed)
Rec'd call from pt wife requesting refill on his Oxycodone, also they are planning a trip and have fly they have not flown in over 20 years wanting MD to rx something to keep calm while going and coming back...[name]/lmb

## 2016-11-25 MED ORDER — OXYCODONE-ACETAMINOPHEN 5-325 MG PO TABS: 1.0000 | ORAL_TABLET | Freq: Every day | ORAL | 0 refills | Status: DC | PRN

## 2016-11-25 MED ORDER — ALPRAZOLAM 0.5 MG PO TABS: 0.5000 mg | ORAL_TABLET | Freq: Two times a day (BID) | ORAL | 0 refills | Status: DC | PRN

## 2016-11-25 NOTE — Telephone Encounter (Signed)
Notified pt wife rx ready for pick-up../lmb 

## 2016-11-25 NOTE — Telephone Encounter (Signed)
rx x 2 -= Done hardcopy to [employer] [facility] [employer]

## 2016-11-25 NOTE — Telephone Encounter (Signed)
Front desk

## 2016-12-01 ENCOUNTER — Ambulatory Visit (INDEPENDENT_AMBULATORY_CARE_PROVIDER_SITE_OTHER): Payer: Medicare HMO | Admitting: Internal Medicine

## 2016-12-01 ENCOUNTER — Encounter: Payer: Self-pay | Admitting: Internal Medicine

## 2016-12-01 DIAGNOSIS — R69 Illness, unspecified: Secondary | ICD-10-CM

## 2016-12-01 DIAGNOSIS — J111 Influenza due to unidentified influenza virus with other respiratory manifestations: Secondary | ICD-10-CM | POA: Insufficient documentation

## 2016-12-01 MED ORDER — HYDROCODONE-HOMATROPINE 5-1.5 MG/5ML PO SYRP: 5.0000 mL | ORAL_SOLUTION | Freq: Three times a day (TID) | ORAL | 0 refills | Status: DC | PRN

## 2016-12-01 MED ORDER — OSELTAMIVIR PHOSPHATE 75 MG PO CAPS: 75.0000 mg | ORAL_CAPSULE | Freq: Two times a day (BID) | ORAL | 0 refills | Status: DC

## 2016-12-01 MED ORDER — FLUTICASONE PROPIONATE 50 MCG/ACT NA SUSP: 2.0000 | Freq: Every day | NASAL | 6 refills | Status: DC

## 2016-12-01 NOTE — Patient Instructions (Signed)
We have sent  In cough medicine for the cough which you can use as needed.   This is a viral cold and should go away in about 1 week.  We have sent in tamiflu in case this is the flu. Take 1 pill twice a day for 5 days.

## 2016-12-01 NOTE — Assessment & Plan Note (Signed)
Rx for tamiflu as symptoms are consistent and in range. Rx for hycodan for cough and flonase for congestion.

## 2016-12-01 NOTE — Progress Notes (Signed)
   Subjective:    Patient ID: Darren Black, male    DOB: [birth year], 51 y.o.   MRN: [id number]  HPI The patient is a 51 YO man coming in for flu like symptoms. Started about 1 day ago. Wife sick with the same thing 2 days before. Having body aches, fevers, chills, headaches. Some cough and cold symptoms as well. He is not taking anything. Tried otc cough medicine which did not help. Non-productive cough. No SOB with exertion. Not taking any allergy medicines now.   Review of Systems  Constitutional: Positive for activity change, appetite change, chills, fatigue and fever. Negative for unexpected weight change.  HENT: Positive for congestion, postnasal drip, rhinorrhea and sore throat. Negative for ear discharge, ear pain, sinus pain, sinus pressure and trouble swallowing.   Eyes: Negative.   Respiratory: Positive for cough. Negative for chest tightness, shortness of breath and wheezing.   Cardiovascular: Negative.   Gastrointestinal: Negative.   Musculoskeletal: Positive for myalgias.  Neurological: Positive for headaches.      Objective:   Physical Exam  Constitutional: He is oriented to person, place, and time. He appears well-developed and well-nourished.  HENT:  Head: Normocephalic and atraumatic.  Right Ear: External ear normal.  Left Ear: External ear normal.  Oropharynx with redness and clear drainage, nose with drainage, no sinus pressure  Eyes: EOM are normal.  Neck: Normal range of motion. No JVD present.  Cardiovascular: Normal rate and regular rhythm.   Pulmonary/Chest: Effort normal. No respiratory distress. He has no wheezes. He has no rales. He exhibits no tenderness.  Abdominal: Soft.  Lymphadenopathy:    He has no cervical adenopathy.  Neurological: He is alert and oriented to person, place, and time.  Skin: Skin is warm and dry.   Vitals:   12/01/16 1421  BP: (!) 144/94  Pulse: 96  Resp: 14  Temp: (!) 100.6 F (38.1 C)  TempSrc: Oral  SpO2: 94%  Weight:  281 lb (127.5 kg)  Height:  (1.702 m)      Assessment & Plan:

## 2016-12-26 ENCOUNTER — Telehealth: Payer: Self-pay | Admitting: *Deleted

## 2016-12-26 MED ORDER — OXYCODONE-ACETAMINOPHEN 5-325 MG PO TABS: 1.0000 | ORAL_TABLET | Freq: Every day | ORAL | 0 refills | Status: DC | PRN

## 2016-12-26 NOTE — Telephone Encounter (Signed)
Rec'd call pt requesting refill for Oxycodone...[name]

## 2016-12-26 NOTE — Telephone Encounter (Signed)
Done hardcopy to Shirron  

## 2016-12-30 NOTE — Telephone Encounter (Signed)
Called pt, LVM. Script is at front desk.

## 2017-01-06 ENCOUNTER — Ambulatory Visit: Payer: Medicare HMO | Admitting: Internal Medicine

## 2017-01-21 ENCOUNTER — Encounter: Payer: Self-pay | Admitting: Internal Medicine

## 2017-01-21 ENCOUNTER — Other Ambulatory Visit (INDEPENDENT_AMBULATORY_CARE_PROVIDER_SITE_OTHER): Payer: Medicare HMO

## 2017-01-21 ENCOUNTER — Ambulatory Visit (INDEPENDENT_AMBULATORY_CARE_PROVIDER_SITE_OTHER): Payer: Medicare HMO | Admitting: Internal Medicine

## 2017-01-21 VITALS — BP 114/80 | HR 88 | Ht 67.0 in | Wt 282.0 lb

## 2017-01-21 DIAGNOSIS — Z114 Encounter for screening for human immunodeficiency virus [HIV]: Secondary | ICD-10-CM | POA: Diagnosis not present

## 2017-01-21 DIAGNOSIS — Z0000 Encounter for general adult medical examination without abnormal findings: Secondary | ICD-10-CM | POA: Diagnosis not present

## 2017-01-21 DIAGNOSIS — R69 Illness, unspecified: Secondary | ICD-10-CM | POA: Diagnosis not present

## 2017-01-21 DIAGNOSIS — Z23 Encounter for immunization: Secondary | ICD-10-CM | POA: Diagnosis not present

## 2017-01-21 DIAGNOSIS — I10 Essential (primary) hypertension: Secondary | ICD-10-CM | POA: Diagnosis not present

## 2017-01-21 LAB — HEPATIC FUNCTION PANEL
ALK PHOS: 44 U/L (ref 39–117)
ALT: 19 U/L (ref 0–53)
AST: 19 U/L (ref 0–37)
Albumin: 4.8 g/dL (ref 3.5–5.2)
Bilirubin, Direct: 0.2 mg/dL (ref 0.0–0.3)
TOTAL PROTEIN: 7.2 g/dL (ref 6.0–8.3)
Total Bilirubin: 0.9 mg/dL (ref 0.2–1.2)

## 2017-01-21 LAB — CBC WITH DIFFERENTIAL/PLATELET
BASOS PCT: 0.8 % (ref 0.0–3.0)
Basophils Absolute: 0.1 10*3/uL (ref 0.0–0.1)
EOS PCT: 3.5 % (ref 0.0–5.0)
Eosinophils Absolute: 0.3 10*3/uL (ref 0.0–0.7)
HEMATOCRIT: 45.1 % (ref 39.0–52.0)
Hemoglobin: 15.1 g/dL (ref 13.0–17.0)
LYMPHS ABS: 1.6 10*3/uL (ref 0.7–4.0)
Lymphocytes Relative: 19.6 % (ref 12.0–46.0)
MCHC: 33.5 g/dL (ref 30.0–36.0)
MCV: 80.8 fl (ref 78.0–100.0)
MONOS PCT: 9.1 % (ref 3.0–12.0)
Monocytes Absolute: 0.7 10*3/uL (ref 0.1–1.0)
NEUTROS ABS: 5.4 10*3/uL (ref 1.4–7.7)
NEUTROS PCT: 67 % (ref 43.0–77.0)
PLATELETS: 235 10*3/uL (ref 150.0–400.0)
RBC: 5.58 Mil/uL (ref 4.22–5.81)
RDW: 14.2 % (ref 11.5–15.5)
WBC: 8.1 10*3/uL (ref 4.0–10.5)

## 2017-01-21 LAB — URINALYSIS, ROUTINE W REFLEX MICROSCOPIC
BILIRUBIN URINE: NEGATIVE
Hgb urine dipstick: NEGATIVE
Ketones, ur: NEGATIVE
Leukocytes, UA: NEGATIVE
Nitrite: NEGATIVE
PH: 6 (ref 5.0–8.0)
RBC / HPF: NONE SEEN (ref 0–?)
SPECIFIC GRAVITY, URINE: 1.01 (ref 1.000–1.030)
Urine Glucose: NEGATIVE
Urobilinogen, UA: 0.2 (ref 0.0–1.0)
WBC UA: NONE SEEN (ref 0–?)

## 2017-01-21 LAB — PSA: PSA: 1.11 ng/mL (ref 0.10–4.00)

## 2017-01-21 LAB — LIPID PANEL
Cholesterol: 217 mg/dL — ABNORMAL HIGH (ref 0–200)
HDL: 49.1 mg/dL (ref >39.00)
LDL Cholesterol: 137 mg/dL — ABNORMAL HIGH (ref 0–99)
NONHDL: 167.94
Total CHOL/HDL Ratio: 4
Triglycerides: 156 mg/dL — ABNORMAL HIGH (ref 0.0–149.0)
VLDL: 31.2 mg/dL (ref 0.0–40.0)

## 2017-01-21 LAB — BASIC METABOLIC PANEL
BUN: 17 mg/dL (ref 6–23)
CALCIUM: 9.9 mg/dL (ref 8.4–10.5)
CO2: 28 meq/L (ref 19–32)
Chloride: 99 mEq/L (ref 96–112)
Creatinine, Ser: 1.43 mg/dL (ref 0.40–1.50)
GFR: 55.66 mL/min — AB (ref >60.00)
Glucose, Bld: 93 mg/dL (ref 70–99)
Potassium: 3.6 mEq/L (ref 3.5–5.1)
SODIUM: 139 meq/L (ref 135–145)

## 2017-01-21 LAB — TSH: TSH: 2.14 u[IU]/mL (ref 0.35–4.50)

## 2017-01-21 MED ORDER — OXYCODONE-ACETAMINOPHEN 5-325 MG PO TABS: 1.0000 | ORAL_TABLET | Freq: Every day | ORAL | 0 refills | Status: DC | PRN

## 2017-01-21 NOTE — Patient Instructions (Addendum)
You had the Tdap tetanus shot today  Please continue all other medications as before, and refills have been done if requested - the pain medication  Please have the pharmacy call with any other refills you may need.  Please continue your efforts at being more active, low cholesterol diet, and weight control.  You are otherwise up to date with prevention measures today.  Please keep your appointments with your specialists as you may have planned  Please go to the LAB in the Basement (turn left off the elevator) for the tests to be done today  You will be contacted by phone if any changes need to be made immediately.  Otherwise, you will receive a letter about your results with an explanation, but please check with MyChart first.  Please remember to sign up for MyChart if you have not done so, as this will be important to you in the future with finding out test results, communicating by private email, and scheduling acute appointments online when needed.  Please return in 6 months, or sooner if needed

## 2017-01-21 NOTE — Progress Notes (Signed)
Subjective:    Patient ID: Darren Black, male    DOB: [birth year], 51 y.o.   MRN: [id number]  HPI  Here for wellness and f/u;  Overall doing ok;  Pt denies Chest pain, worsening SOB, DOE, wheezing, orthopnea, PND, worsening LE edema, palpitations, dizziness or syncope.  Pt denies neurological change such as new headache, facial or extremity weakness.  Pt denies polydipsia, polyuria, or low sugar symptoms. Pt states overall good compliance with treatment and medications, good tolerability, and has been trying to follow appropriate diet.  Pt denies worsening depressive symptoms, suicidal ideation or panic. No fever, night sweats, wt loss, loss of appetite, or other constitutional symptoms.  Pt states good ability with ADL's, has low fall risk, home safety reviewed and adequate, no other significant changes in hearing or vision, and only occasionally active with exercise.  Asks for cologuard in liue of colonoscopy but not at this time. Wt Readings from Last 3 Encounters:  01/21/17 282 lb (127.9 kg)  12/01/16 281 lb (127.5 kg)  07/08/16 282 lb (127.9 kg)   BP Readings from Last 3 Encounters:  01/21/17 114/80  12/01/16 (!) 144/94  07/08/16 138/78   Past Medical History:  Diagnosis Date  . Anxiety   . BPH (benign prostatic hypertrophy)   . Chronic lower back pain    nerve damage w/ leg pain  . Depression   . Family history of adverse reaction to anesthesia    MOTHER--- HARD TO WAKE  . History of panic attacks   . Hyperlipidemia   . Hypertension   . Meatal stenosis   . OSA on CPAP    moderate to severe OSA  per study 04-28-2007 uses CPAP  . PONV (postoperative nausea and vomiting)    Past Surgical History:  Procedure Laterality Date  . APPENDECTOMY  2003  . BREATH TEK H PYLORI  04/27/2012   Procedure: BREATH TEK H PYLORI;  Surgeon: Valarie Merino, MD;  [facility];  Service: General;  Laterality: N/A;  . CYSTOSCOPY WITH URETHRAL DILATATION N/A 04/12/2015   Procedure:  CYSTOSCOPY WITH URETHRAL DILATATION, RETROGRADE AND URETHROGRAM WITH BLADDER BIOPSY;  Surgeon: Bjorn Pippin, MD;  Location: [facility];  Service: Urology;  Laterality: N/A;  . ROTATOR CUFF REPAIR Right 01-21-2012  . TRANSTHORACIC ECHOCARDIOGRAM  03-26-2007   normal LVSF, ef 60%,  mild AV calcification without stenosis,  mild MR and TR,  mild LAE  . URETHROGRAM N/A 04/12/2015   Procedure: URETHROGRAM;  Surgeon: Bjorn Pippin, MD;  Location: [facility];  Service: Urology;  Laterality: N/A;    reports that he has never smoked. He has never used smokeless tobacco. He reports that he drinks alcohol. He reports that he does not use drugs. family history includes Cancer in his father; Coronary artery disease in his father; Diabetes in his other; Hyperlipidemia in his other; Hypertension in his other; Stroke in his other. Allergies  Allergen Reactions  . Flomax [Tamsulosin Hcl] Rash   Current Outpatient Prescriptions on File Prior to Visit  Medication Sig Dispense Refill  . ALPRAZolam (XANAX) 0.5 MG tablet Take 1 tablet (0.5 mg total) by mouth 2 (two) times daily as needed for anxiety. 10 tablet 0  . amLODipine (NORVASC) 10 MG tablet Take 5 mg by mouth every morning.    Marland Kitchen amLODipine (NORVASC) 5 MG tablet TAKE ONE TABLET BY MOUTH ONCE DAILY 90 tablet 3  . benazepril (LOTENSIN) 40 MG tablet Take 1 tablet (40 mg total) by mouth daily.  90 tablet 3  . doxazosin (CARDURA) 4 MG tablet Take 1 tablet (4 mg total) by mouth at bedtime. 90 tablet 3  . DULoxetine (CYMBALTA) 60 MG capsule Take 1 capsule (60 mg total) by mouth every morning. 90 capsule 2  . fluticasone (FLONASE) 50 MCG/ACT nasal spray Place 2 sprays into both nostrils daily. 16 g 6  . hydrochlorothiazide (HYDRODIURIL) 25 MG tablet Take 0.5 tablets (12.5 mg total) by mouth daily. 90 tablet 3  . HYDROcodone-homatropine (HYCODAN) 5-1.5 MG/5ML syrup Take 5 mLs by mouth every 8 (eight) hours as needed for cough. 120 mL 0  .  KLOR-CON M20 20 MEQ tablet TAKE ONE-HALF TABLET BY MOUTH ONCE DAILY 45 tablet 3  . LORazepam (ATIVAN) 0.5 MG tablet Take 1 tablet (0.5 mg total) by mouth 2 (two) times daily as needed for anxiety. 60 tablet 5  . Lycopene 10 MG CAPS Take 1 capsule by mouth every evening.    . Omega 3-6-9 Fatty Acids (OMEGA 3-6-9 COMPLEX PO) Take 1 capsule by mouth daily.     . pravastatin (PRAVACHOL) 40 MG tablet Take 1 tablet (40 mg total) by mouth daily. 90 tablet 3  . [DISCONTINUED] potassium chloride (KLOR-CON 10) 10 MEQ CR tablet Take 1 tablet (10 mEq total) by mouth daily. 90 tablet 3   No current facility-administered medications on file prior to visit.    Review of Systems Constitutional: Negative for other unusual diaphoresis, sweats, appetite or weight changes HENT: Negative for other worsening hearing loss, ear pain, facial swelling, mouth sores or neck stiffness.   Eyes: Negative for other worsening pain, redness or other visual disturbance.  Respiratory: Negative for other stridor or swelling Cardiovascular: Negative for other palpitations or other chest pain  Gastrointestinal: Negative for worsening diarrhea or loose stools, blood in stool, distention or other pain Genitourinary: Negative for hematuria, flank pain or other change in urine volume.  Musculoskeletal: Negative for myalgias or other joint swelling.  Skin: Negative for other color change, or other wound or worsening drainage.  Neurological: Negative for other syncope or numbness. Hematological: Negative for other adenopathy or swelling Psychiatric/Behavioral: Negative for hallucinations, other worsening agitation, SI, self-injury, or new decreased concentration All other system neg per pt    Objective:   Physical Exam BP 114/80   Pulse 88   Ht 5\' 7"  (1.702 m)   Wt 282 lb (127.9 kg)   SpO2 99%   BMI 44.17 kg/m  VS noted, morbid obese Constitutional: Pt is oriented to person, place, and time. Appears well-developed and  well-nourished, in no significant distress and comfortable Head: Normocephalic and atraumatic  Eyes: Conjunctivae and EOM are normal. Pupils are equal, round, and reactive to light Right Ear: External ear normal without discharge Left Ear: External ear normal without discharge Nose: Nose without discharge or deformity Mouth/Throat: Oropharynx is without other ulcerations and moist  Neck: Normal range of motion. Neck supple. No JVD present. No tracheal deviation present or significant neck LA or mass Cardiovascular: Normal rate, regular rhythm, normal heart sounds and intact distal pulses. Pulmonary/Chest: WOB normal and breath sounds without rales or wheezing  Abdominal: Soft. Bowel sounds are normal. NT. No HSM  Musculoskeletal: Normal range of motion. Exhibits no edema Lymphadenopathy: Has no other cervical adenopathy.  Neurological: Pt is alert and oriented to person, place, and time. Pt has normal reflexes. No cranial nerve deficit. Motor grossly intact, Gait intact Skin: Skin is warm and dry. No rash noted or new ulcerations Psychiatric:  Has dysphoric mood  and affect. Behavior is normal without agitation No other exam findings    Assessment & Plan:

## 2017-01-22 ENCOUNTER — Telehealth: Payer: Self-pay

## 2017-01-22 DIAGNOSIS — Z0279 Encounter for issue of other medical certificate: Secondary | ICD-10-CM

## 2017-01-22 LAB — HIV ANTIBODY (ROUTINE TESTING W REFLEX): HIV: NONREACTIVE

## 2017-01-22 NOTE — Telephone Encounter (Signed)
LVM for pt's wife informing her [employer] paperwork has been filled out and the original was ready for pick up at the front desk.

## 2017-01-23 NOTE — Assessment & Plan Note (Signed)

## 2017-01-23 NOTE — Telephone Encounter (Signed)
Froms Faxed 5/25, sent to scan &Charged for.

## 2017-01-23 NOTE — Assessment & Plan Note (Signed)
stable overall by history and exam, recent data reviewed with pt, and pt to continue medical treatment as before,  to f/u any worsening symptoms or concerns BP Readings from Last 3 Encounters:  01/21/17 114/80  12/01/16 (!) 144/94  07/08/16 138/78

## 2017-02-07 ENCOUNTER — Other Ambulatory Visit: Payer: Self-pay | Admitting: Internal Medicine

## 2017-02-16 DIAGNOSIS — N35011 Post-traumatic bulbous urethral stricture: Secondary | ICD-10-CM | POA: Diagnosis not present

## 2017-02-16 DIAGNOSIS — N401 Enlarged prostate with lower urinary tract symptoms: Secondary | ICD-10-CM | POA: Diagnosis not present

## 2017-02-16 DIAGNOSIS — R3914 Feeling of incomplete bladder emptying: Secondary | ICD-10-CM | POA: Diagnosis not present

## 2017-02-20 ENCOUNTER — Telehealth: Payer: Self-pay | Admitting: *Deleted

## 2017-02-20 MED ORDER — OXYCODONE-ACETAMINOPHEN 5-325 MG PO TABS: 1.0000 | ORAL_TABLET | Freq: Every day | ORAL | 0 refills | Status: DC | PRN

## 2017-02-20 NOTE — Telephone Encounter (Signed)
Called pt, LVM informing that script is ready At front desk

## 2017-02-20 NOTE — Telephone Encounter (Signed)
Rec'd call from pt wife requesting a refill on his Oxycodone...[name]/lmb

## 2017-02-20 NOTE — Telephone Encounter (Signed)
Done Done hardcopy to Shirron  

## 2017-03-03 ENCOUNTER — Telehealth: Payer: Self-pay | Admitting: *Deleted

## 2017-03-03 MED ORDER — DULOXETINE HCL 60 MG PO CPEP: 60.0000 mg | ORAL_CAPSULE | Freq: Every morning | ORAL | 2 refills | Status: DC

## 2017-03-03 NOTE — Telephone Encounter (Signed)
Rec'd call frpm pt wife stating pt is needing refills sent to [facility] on his [facility]. Fax # [phone]. Verified chart pt is up-to-date inform will faxed to speciality pharmacy...Ishmael Holter/lmnb

## 2017-03-23 ENCOUNTER — Other Ambulatory Visit: Payer: Self-pay | Admitting: Internal Medicine

## 2017-03-23 NOTE — Telephone Encounter (Signed)
Pt would like a refill of oxyCODONE-acetaminophen (PERCOCET/ROXICET) 5-325 MG tablet  ° ° °

## 2017-03-24 MED ORDER — OXYCODONE-ACETAMINOPHEN 5-325 MG PO TABS: 1.0000 | ORAL_TABLET | Freq: Every day | ORAL | 0 refills | Status: DC | PRN

## 2017-03-24 NOTE — Telephone Encounter (Signed)
Informed pt via VM Script at front

## 2017-03-24 NOTE — Telephone Encounter (Signed)
Done hardcopy to Shirron  

## 2017-04-22 ENCOUNTER — Telehealth: Payer: Self-pay | Admitting: Internal Medicine

## 2017-04-22 MED ORDER — OXYCODONE-ACETAMINOPHEN 5-325 MG PO TABS: 1.0000 | ORAL_TABLET | Freq: Every day | ORAL | 0 refills | Status: DC | PRN

## 2017-04-22 NOTE — Telephone Encounter (Signed)
[name] wife informed Script at front desk

## 2017-04-22 NOTE — Telephone Encounter (Signed)
Done hardcopy to Shirron  

## 2017-04-22 NOTE — Telephone Encounter (Signed)
Check [place] registry last filled 03/25/2017.../lmb  

## 2017-04-22 NOTE — Telephone Encounter (Signed)
Patients spouse called in requesting refill on oxycodone.

## 2017-05-21 ENCOUNTER — Telehealth: Payer: Self-pay | Admitting: Internal Medicine

## 2017-05-21 ENCOUNTER — Other Ambulatory Visit: Payer: Self-pay | Admitting: Internal Medicine

## 2017-05-21 NOTE — Telephone Encounter (Signed)
Done hardcopy to Shirron  

## 2017-05-21 NOTE — Telephone Encounter (Signed)
Requesting refill for oxycodone 

## 2017-05-21 NOTE — Telephone Encounter (Signed)
Faxed

## 2017-05-22 MED ORDER — OXYCODONE-ACETAMINOPHEN 5-325 MG PO TABS: 1.0000 | ORAL_TABLET | Freq: Every day | ORAL | 0 refills | Status: DC | PRN

## 2017-05-22 NOTE — Telephone Encounter (Signed)
Pt informed Script at front desk 

## 2017-05-22 NOTE — Telephone Encounter (Signed)
Done hardcopy to Shirron  

## 2017-06-22 ENCOUNTER — Telehealth: Payer: Self-pay | Admitting: Internal Medicine

## 2017-06-22 MED ORDER — OXYCODONE-ACETAMINOPHEN 5-325 MG PO TABS: 1.0000 | ORAL_TABLET | Freq: Every day | ORAL | 0 refills | Status: DC | PRN

## 2017-06-22 NOTE — Telephone Encounter (Signed)
oxyCODONE-acetaminophen (PERCOCET/ROXICET) 5-325 MG tablet   Patient's wife called and is requesting a refill on this medication. Please advise.

## 2017-06-22 NOTE — Telephone Encounter (Signed)
[place] controlled substance database checked.  Ok to fill medication. rx printed 

## 2017-06-22 NOTE — Telephone Encounter (Signed)
Informed pt Script at front desk  

## 2017-06-25 ENCOUNTER — Ambulatory Visit: Payer: Medicare HMO | Admitting: Gastroenterology

## 2017-07-19 ENCOUNTER — Other Ambulatory Visit: Payer: Self-pay | Admitting: Internal Medicine

## 2017-07-20 ENCOUNTER — Telehealth: Payer: Self-pay | Admitting: Internal Medicine

## 2017-07-20 MED ORDER — OXYCODONE-ACETAMINOPHEN 5-325 MG PO TABS: 1.0000 | ORAL_TABLET | Freq: Every day | ORAL | 0 refills | Status: DC | PRN

## 2017-07-20 NOTE — Telephone Encounter (Signed)
Pt wife called for a refill of his oxyCODONE-acetaminophen (PERCOCET/ROXICET) 5-325 MG tablet Please advise

## 2017-07-20 NOTE — Telephone Encounter (Signed)
Done erx 

## 2017-07-20 NOTE — Telephone Encounter (Signed)
Notified pt wife [facility].../lmb 

## 2017-07-20 NOTE — Telephone Encounter (Signed)
Check  registry last filled 06/22/2017.../lmb  

## 2017-07-28 ENCOUNTER — Encounter: Payer: Self-pay | Admitting: Internal Medicine

## 2017-07-28 ENCOUNTER — Ambulatory Visit (INDEPENDENT_AMBULATORY_CARE_PROVIDER_SITE_OTHER): Payer: Medicare HMO | Admitting: Internal Medicine

## 2017-07-28 VITALS — BP 126/88 | HR 73 | Temp 97.9°F | Ht 61.0 in | Wt 281.0 lb

## 2017-07-28 DIAGNOSIS — I10 Essential (primary) hypertension: Secondary | ICD-10-CM | POA: Diagnosis not present

## 2017-07-28 DIAGNOSIS — R739 Hyperglycemia, unspecified: Secondary | ICD-10-CM | POA: Insufficient documentation

## 2017-07-28 DIAGNOSIS — E785 Hyperlipidemia, unspecified: Secondary | ICD-10-CM | POA: Diagnosis not present

## 2017-07-28 DIAGNOSIS — Z0000 Encounter for general adult medical examination without abnormal findings: Secondary | ICD-10-CM

## 2017-07-28 DIAGNOSIS — Z23 Encounter for immunization: Secondary | ICD-10-CM | POA: Diagnosis not present

## 2017-07-28 LAB — POCT GLYCOSYLATED HEMOGLOBIN (HGB A1C): Hemoglobin A1C: 5.7

## 2017-07-28 NOTE — Assessment & Plan Note (Signed)
a1c normal, asympt, stable overall by history and exam, recent data reviewed with pt, and pt to continue medical treatment as before,  to f/u any worsening symptoms or concerns

## 2017-07-28 NOTE — Progress Notes (Signed)
Subjective:    Patient ID: Darren Black, male    DOB: [birth year], 51 y.o.   MRN: [id number]  HPI  Here to f/u; overall doing ok,  Pt denies chest pain, increasing sob or doe, wheezing, orthopnea, PND, increased LE swelling, palpitations, dizziness or syncope.  Pt denies new neurological symptoms such as new headache, or facial or extremity weakness or numbness.  Pt denies polydipsia, polyuria, or low sugar episode.  Pt states overall good compliance with meds, mostly trying to follow appropriate diet, with wt overall stable,  but little exercise however.  Declines colonoscopy , but ok for cologuard  Good compliacne with CPAP, does not bother him at all.  No new compliants Wt Readings from Last 3 Encounters:  07/28/17 281 lb (127.5 kg)  01/21/17 282 lb (127.9 kg)  12/01/16 281 lb (127.5 kg)   Past Medical History:  Diagnosis Date  . Anxiety   . BPH (benign prostatic hypertrophy)   . Chronic lower back pain    nerve damage w/ leg pain  . Depression   . Family history of adverse reaction to anesthesia    MOTHER--- HARD TO WAKE  . History of panic attacks   . Hyperlipidemia   . Hypertension   . Meatal stenosis   . OSA on CPAP    moderate to severe OSA  per study 04-28-2007 uses CPAP  . PONV (postoperative nausea and vomiting)    Past Surgical History:  Procedure Laterality Date  . APPENDECTOMY  2003  . BREATH TEK H PYLORI  04/27/2012   Procedure: BREATH TEK H PYLORI;  Surgeon: Valarie MerinoMatthew B Martin, MD;  [facility];  Service: General;  Laterality: N/A;  . CYSTOSCOPY WITH URETHRAL DILATATION N/A 04/12/2015   Procedure: CYSTOSCOPY WITH URETHRAL DILATATION, RETROGRADE AND URETHROGRAM WITH BLADDER BIOPSY;  Surgeon: Bjorn PippinJohn Wrenn, MD;  [facility];  Service: Urology;  Laterality: N/A;  . ROTATOR CUFF REPAIR Right 01-21-2012  . TRANSTHORACIC ECHOCARDIOGRAM  03-26-2007   normal LVSF, ef 60%,  mild AV calcification without stenosis,  mild MR and TR,  mild LAE  .  URETHROGRAM N/A 04/12/2015   Procedure: URETHROGRAM;  Surgeon: Bjorn PippinJohn Wrenn, MD;  [facility] [place];  Service: Urology;  Laterality: N/A;    reports that  has never smoked. he has never used smokeless tobacco. He reports that he drinks alcohol. He reports that he does not use drugs. family history includes Cancer in his father; Coronary artery disease in his father; Diabetes in his other; Hyperlipidemia in his other; Hypertension in his other; Stroke in his other. Allergies  Allergen Reactions  . Flomax [Tamsulosin Hcl] Rash   Current Outpatient Medications on File Prior to Visit  Medication Sig Dispense Refill  . ALPRAZolam (XANAX) 0.5 MG tablet Take 1 tablet (0.5 mg total) by mouth 2 (two) times daily as needed for anxiety. 10 tablet 0  . amLODipine (NORVASC) 10 MG tablet Take 5 mg by mouth every morning.    Marland Kitchen. amLODipine (NORVASC) 5 MG tablet TAKE ONE TABLET BY MOUTH ONCE DAILY 90 tablet 3  . benazepril (LOTENSIN) 40 MG tablet TAKE ONE TABLET BY MOUTH ONCE DAILY 90 tablet 1  . doxazosin (CARDURA) 4 MG tablet Take 1 tablet (4 mg total) by mouth at bedtime. 90 tablet 3  . DULoxetine (CYMBALTA) 60 MG capsule Take 1 capsule (60 mg total) by mouth every morning. 90 capsule 2  . fluticasone (FLONASE) 50 MCG/ACT nasal spray Place 2 sprays into both nostrils  daily. 16 g 6  . hydrochlorothiazide (HYDRODIURIL) 25 MG tablet Take 0.5 tablets (12.5 mg total) by mouth daily. 90 tablet 3  . HYDROcodone-homatropine (HYCODAN) 5-1.5 MG/5ML syrup Take 5 mLs by mouth every 8 (eight) hours as needed for cough. 120 mL 0  . KLOR-CON M20 20 MEQ tablet TAKE ONE-HALF TABLET BY MOUTH ONCE DAILY 45 tablet 3  . LORazepam (ATIVAN) 0.5 MG tablet TAKE 1 TABLET BY MOUTH TWICE DAILY AS NEEDED FOR ANXIETY 60 tablet 5  . Lycopene 10 MG CAPS Take 1 capsule by mouth every evening.    . Omega 3-6-9 Fatty Acids (OMEGA 3-6-9 COMPLEX PO) Take 1 capsule by mouth daily.     Marland Kitchen. oxyCODONE-acetaminophen (PERCOCET/ROXICET)  5-325 MG tablet Take 1 tablet daily as needed by mouth for severe pain. 30 tablet 0  . pravastatin (PRAVACHOL) 40 MG tablet Take 1 tablet (40 mg total) by mouth daily. 90 tablet 3  . [DISCONTINUED] potassium chloride (KLOR-CON 10) 10 MEQ CR tablet Take 1 tablet (10 mEq total) by mouth daily. 90 tablet 3   No current facility-administered medications on file prior to visit.    Review of Systems  Constitutional: Negative for other unusual diaphoresis or sweats HENT: Negative for ear discharge or swelling Eyes: Negative for other worsening visual disturbances Respiratory: Negative for stridor or other swelling  Gastrointestinal: Negative for worsening distension or other blood Genitourinary: Negative for retention or other urinary change Musculoskeletal: Negative for other MSK pain or swelling Skin: Negative for color change or other new lesions Neurological: Negative for worsening tremors and other numbness  Psychiatric/Behavioral: Negative for worsening agitation or other fatigue All other system neg per pt    Objective:   Physical Exam BP 126/88   Pulse 73   Temp 97.9 F (36.6 C) (Oral)   Ht 5\' 1"  (1.549 m)   Wt 281 lb (127.5 kg)   SpO2 97%   BMI 53.09 kg/m  VS noted,  Constitutional: Pt appears in NAD HENT: Head: NCAT.  Right Ear: External ear normal.  Left Ear: External ear normal.  Eyes: . Pupils are equal, round, and reactive to light. Conjunctivae and EOM are normal Nose: without d/c or deformity Neck: Neck supple. Gross normal ROM Cardiovascular: Normal rate and regular rhythm.   Pulmonary/Chest: Effort normal and breath sounds without rales or wheezing.  Neurological: Pt is alert. At baseline orientation, motor grossly intact Skin: Skin is warm. No rashes, other new lesions, no LE edema Psychiatric: Pt behavior is normal without agitation  No other exam findings  POCT glycosylated hemoglobin (Hb A1C)  Order: [id number]  Status:  Final result Visible to patient:   No (Not Released) Dx:  Hyperglycemia  Component 14:19  Hemoglobin A1C 5.7             Assessment & Plan:

## 2017-07-28 NOTE — Assessment & Plan Note (Signed)
stable overall by history and exam, recent data reviewed with pt, and pt to continue medical treatment as before,  to f/u any worsening symptoms or concerns BP Readings from Last 3 Encounters:  07/28/17 126/88  01/21/17 114/80  12/01/16 (!) 144/94

## 2017-07-28 NOTE — Assessment & Plan Note (Signed)
stable overall by history and exam, recent data reviewed with pt, and pt to continue medical treatment as before,  to f/u any worsening symptoms or concerns  Lab Results  Component Value Date   LDLCALC 137 (H) 01/21/2017  for lower chol diet, declines med change

## 2017-07-28 NOTE — Patient Instructions (Addendum)
You had the flu shot today  Shirron will be asked to sign you up for the cologuard  Please continue all other medications as before, and refills have been done if requested.  Please have the pharmacy call with any other refills you may need.  Please continue your efforts at being more active, low cholesterol diet, and weight control.  Please keep your appointments with your specialists as you may have planned  Your A1c was OK today  Please return in 6 months, or sooner if needed, with Lab testing done 3-5 days before

## 2017-08-17 ENCOUNTER — Telehealth: Payer: Self-pay | Admitting: Internal Medicine

## 2017-08-17 MED ORDER — OXYCODONE-ACETAMINOPHEN 5-325 MG PO TABS: 1.0000 | ORAL_TABLET | Freq: Every day | ORAL | 0 refills | Status: DC | PRN

## 2017-08-17 NOTE — Telephone Encounter (Signed)
Done erx 

## 2017-08-17 NOTE — Telephone Encounter (Signed)
Copied from CRM [id number]. Topic: Quick Communication - See Telephone Encounter >> Aug 17, 2017 10:28 AM Diana EvesHoyt, Maryann B wrote: CRM for notification. See Telephone encounter for:  Pt needing refill on oxycodone  08/17/17.

## 2017-08-17 NOTE — Telephone Encounter (Signed)
Notified pt wife rx has been sent to [facility]../lmb 

## 2017-08-17 NOTE — Telephone Encounter (Signed)
Check  registry last filled 07/20/2017.../lmb  

## 2017-09-15 ENCOUNTER — Other Ambulatory Visit: Payer: Self-pay | Admitting: Internal Medicine

## 2017-09-15 MED ORDER — OXYCODONE-ACETAMINOPHEN 5-325 MG PO TABS: 1.0000 | ORAL_TABLET | Freq: Every day | ORAL | 0 refills | Status: DC | PRN

## 2017-09-15 NOTE — Telephone Encounter (Signed)
Done erx 

## 2017-10-08 ENCOUNTER — Other Ambulatory Visit: Payer: Self-pay | Admitting: Internal Medicine

## 2017-10-13 ENCOUNTER — Telehealth: Payer: Self-pay | Admitting: Internal Medicine

## 2017-10-13 MED ORDER — OXYCODONE-ACETAMINOPHEN 5-325 MG PO TABS: 1.0000 | ORAL_TABLET | Freq: Every day | ORAL | 0 refills | Status: DC | PRN

## 2017-10-13 NOTE — Telephone Encounter (Signed)
Called pt no answer LMOM rx sent to [facility].../lmb 

## 2017-10-13 NOTE — Telephone Encounter (Signed)
Done erx 

## 2017-10-13 NOTE — Telephone Encounter (Signed)
Pt requesting a refill for a controlled med, hydrocodone. LOV  07/28/17 NOV  01/26/18

## 2017-10-13 NOTE — Telephone Encounter (Signed)
Copied from CRM [id number]. Topic: Quick Communication - Rx Refill/Question >> Oct 13, 2017  9:56 AM Stephannie LiSimmons, Carsen Leaf L, NT wrote: Medication: hydrocodone  5-15 mg Has the patient contacted their pharmacy? {yes  (Agent: If no, request that the patient contact the pharmacy for the refill.) Preferred Pharmacy (with phone number or street name):[facility] [address]  [phone] Agent: Please be advised that RX refills may take up to 3 business days. We ask that you follow-up with your phar

## 2017-11-09 ENCOUNTER — Other Ambulatory Visit: Payer: Self-pay | Admitting: Internal Medicine

## 2017-11-09 MED ORDER — OXYCODONE-ACETAMINOPHEN 5-325 MG PO TABS: 1.0000 | ORAL_TABLET | Freq: Every day | ORAL | 0 refills | Status: DC | PRN

## 2017-11-09 NOTE — Telephone Encounter (Signed)
Done erx 

## 2017-11-09 NOTE — Telephone Encounter (Signed)
Copied from CRM #[id number]. Topic: Quick Communication - See Telephone Encounter >> Nov 09, 2017 10:36 AM Windy KalataMichael, [phone] L, NT wrote: CRM for notification. See Telephone encounter for:  11/09/17.  Patient spouse is calling and states he needs a refill on oxyCODONE-acetaminophen (PERCOCET/ROXICET) 5-325 MG tablet. Please advise.   [facility] [place] [address]  [address] [place] [address]  Phone: [phone] Fax: [phone]

## 2017-11-09 NOTE — Telephone Encounter (Signed)
Last OV: 07/28/17 PCP: Jonny RuizJohn Pharmacy: [facility] - [address], [place] - [address] [phone] (Phone) [phone] (Fax)

## 2017-12-15 ENCOUNTER — Other Ambulatory Visit: Payer: Self-pay | Admitting: Internal Medicine

## 2017-12-15 MED ORDER — OXYCODONE-ACETAMINOPHEN 5-325 MG PO TABS: 1.0000 | ORAL_TABLET | Freq: Every day | ORAL | 0 refills | Status: DC | PRN

## 2017-12-15 NOTE — Telephone Encounter (Unsigned)
Copied from CRM [id number]. Topic: Quick Communication - Rx Refill/Question >> Dec 15, 2017 10:20 AM Arlyss Gandyichardson, Darren Black N, NT wrote: Medication: oxyCODONE-acetaminophen (PERCOCET/ROXICET) 5-325 MG tablet Has the patient contacted their pharmacy? Yes.   (Agent: If no, request that the patient contact the pharmacy for the refill.) Preferred Pharmacy (with phone number or street name): [facility] [place] [address] [phone] (Phone) [phone] (Fax)     Agent: Please be advised that RX refills may take up to 3 business days. We ask that you follow-up with your pharmacy.

## 2017-12-15 NOTE — Telephone Encounter (Signed)
Refill  Request  Oxycodone  LOV 07/28/2017  Pharmacy on file

## 2017-12-15 NOTE — Telephone Encounter (Signed)
Done erx 

## 2017-12-28 ENCOUNTER — Other Ambulatory Visit: Payer: Self-pay | Admitting: Internal Medicine

## 2017-12-28 MED ORDER — DULOXETINE HCL 60 MG PO CPEP: 60.0000 mg | ORAL_CAPSULE | Freq: Every morning | ORAL | 3 refills | Status: DC

## 2017-12-28 NOTE — Telephone Encounter (Signed)
Copied from CRM [id number]. Topic: Quick Communication - Rx Refill/Question >> Dec 28, 2017  8:46 AM Leafy Ro wrote: Medication: generic cymbalta 60 mg #100 Has the patient contacted their pharmacy? No (Agent: If no, request that the patient contact the pharmacy for the refill.) Preferred Pharmacy (with phone number or street name[facility] phone number [phone] and fax # [phone]

## 2018-01-03 ENCOUNTER — Other Ambulatory Visit: Payer: Self-pay | Admitting: Internal Medicine

## 2018-01-12 ENCOUNTER — Telehealth: Payer: Self-pay | Admitting: Internal Medicine

## 2018-01-12 MED ORDER — OXYCODONE-ACETAMINOPHEN 5-325 MG PO TABS: 1.0000 | ORAL_TABLET | Freq: Every day | ORAL | 0 refills | Status: DC | PRN

## 2018-01-12 NOTE — Telephone Encounter (Signed)
Done erx 

## 2018-01-12 NOTE — Telephone Encounter (Signed)
Copied from CRM [id number]. Topic: Quick Communication - Rx Refill/Question >> Jan 12, 2018 10:21 AM Percival Spanish wrote: Medication  oxyCODONE-acetaminophen (PERCOCET/ROXICET) 5-325 MG tablet  [facility]acy   [facility] [address]    Agent: Please be advised that RX refills may take up to 3 business days. We ask that you follow-up with your pharmacy.

## 2018-01-19 DIAGNOSIS — R1313 Dysphagia, pharyngeal phase: Secondary | ICD-10-CM | POA: Diagnosis not present

## 2018-01-19 DIAGNOSIS — J310 Chronic rhinitis: Secondary | ICD-10-CM | POA: Diagnosis not present

## 2018-01-26 ENCOUNTER — Ambulatory Visit (INDEPENDENT_AMBULATORY_CARE_PROVIDER_SITE_OTHER): Payer: Medicare HMO | Admitting: Internal Medicine

## 2018-01-26 ENCOUNTER — Encounter: Payer: Self-pay | Admitting: Internal Medicine

## 2018-01-26 ENCOUNTER — Other Ambulatory Visit (INDEPENDENT_AMBULATORY_CARE_PROVIDER_SITE_OTHER): Payer: Medicare HMO

## 2018-01-26 ENCOUNTER — Other Ambulatory Visit: Payer: Self-pay | Admitting: Internal Medicine

## 2018-01-26 VITALS — BP 120/82 | HR 76 | Temp 98.3°F | Ht 61.0 in | Wt 281.0 lb

## 2018-01-26 DIAGNOSIS — Z0000 Encounter for general adult medical examination without abnormal findings: Secondary | ICD-10-CM

## 2018-01-26 DIAGNOSIS — R739 Hyperglycemia, unspecified: Secondary | ICD-10-CM

## 2018-01-26 DIAGNOSIS — I10 Essential (primary) hypertension: Secondary | ICD-10-CM | POA: Diagnosis not present

## 2018-01-26 LAB — URINALYSIS, ROUTINE W REFLEX MICROSCOPIC
Bilirubin Urine: NEGATIVE
Hgb urine dipstick: NEGATIVE
KETONES UR: NEGATIVE
Leukocytes, UA: NEGATIVE
Nitrite: NEGATIVE
PH: 6 (ref 5.0–8.0)
RBC / HPF: NONE SEEN (ref 0–?)
SPECIFIC GRAVITY, URINE: 1.015 (ref 1.000–1.030)
TOTAL PROTEIN, URINE-UPE24: NEGATIVE
UROBILINOGEN UA: 0.2 (ref 0.0–1.0)
Urine Glucose: NEGATIVE
WBC, UA: NONE SEEN (ref 0–?)

## 2018-01-26 LAB — CBC WITH DIFFERENTIAL/PLATELET
BASOS PCT: 1.1 % (ref 0.0–3.0)
Basophils Absolute: 0.1 10*3/uL (ref 0.0–0.1)
EOS PCT: 3.8 % (ref 0.0–5.0)
Eosinophils Absolute: 0.3 10*3/uL (ref 0.0–0.7)
HCT: 40.4 % (ref 39.0–52.0)
HEMOGLOBIN: 13.6 g/dL (ref 13.0–17.0)
LYMPHS ABS: 2.4 10*3/uL (ref 0.7–4.0)
Lymphocytes Relative: 32.8 % (ref 12.0–46.0)
MCHC: 33.7 g/dL (ref 30.0–36.0)
MCV: 78.6 fl (ref 78.0–100.0)
MONO ABS: 0.5 10*3/uL (ref 0.1–1.0)
Monocytes Relative: 6.2 % (ref 3.0–12.0)
NEUTROS ABS: 4.1 10*3/uL (ref 1.4–7.7)
NEUTROS PCT: 56.1 % (ref 43.0–77.0)
Platelets: 219 10*3/uL (ref 150.0–400.0)
RBC: 5.14 Mil/uL (ref 4.22–5.81)
RDW: 13.3 % (ref 11.5–15.5)
WBC: 7.3 10*3/uL (ref 4.0–10.5)

## 2018-01-26 LAB — LIPID PANEL
Cholesterol: 189 mg/dL (ref 0–200)
HDL: 32.5 mg/dL — AB (ref >39.00)
LDL Cholesterol: 120 mg/dL — ABNORMAL HIGH (ref 0–99)
NONHDL: 156.22
Total CHOL/HDL Ratio: 6
Triglycerides: 179 mg/dL — ABNORMAL HIGH (ref 0.0–149.0)
VLDL: 35.8 mg/dL (ref 0.0–40.0)

## 2018-01-26 LAB — HEPATIC FUNCTION PANEL
ALT: 31 U/L (ref 0–53)
AST: 27 U/L (ref 0–37)
Albumin: 4.4 g/dL (ref 3.5–5.2)
Alkaline Phosphatase: 47 U/L (ref 39–117)
BILIRUBIN TOTAL: 0.4 mg/dL (ref 0.2–1.2)
Bilirubin, Direct: 0.1 mg/dL (ref 0.0–0.3)
Total Protein: 7.1 g/dL (ref 6.0–8.3)

## 2018-01-26 LAB — PSA: PSA: 1.11 ng/mL (ref 0.10–4.00)

## 2018-01-26 LAB — BASIC METABOLIC PANEL
BUN: 23 mg/dL (ref 6–23)
CO2: 29 mEq/L (ref 19–32)
Calcium: 9.4 mg/dL (ref 8.4–10.5)
Chloride: 101 mEq/L (ref 96–112)
Creatinine, Ser: 1.51 mg/dL — ABNORMAL HIGH (ref 0.40–1.50)
GFR: 52.06 mL/min — AB (ref >60.00)
GLUCOSE: 96 mg/dL (ref 70–99)
POTASSIUM: 3.6 meq/L (ref 3.5–5.1)
SODIUM: 139 meq/L (ref 135–145)

## 2018-01-26 LAB — TSH: TSH: 1.9 u[IU]/mL (ref 0.35–4.50)

## 2018-01-26 LAB — HEMOGLOBIN A1C: Hgb A1c MFr Bld: 5.7 % (ref 4.6–6.5)

## 2018-01-26 MED ORDER — FLUTICASONE PROPIONATE 50 MCG/ACT NA SUSP: 2.0000 | Freq: Every day | NASAL | 6 refills | Status: DC

## 2018-01-26 NOTE — Progress Notes (Signed)
Subjective:    Patient ID: Darren Black, male    DOB: 05-30-1966, 52 y.o.   MRN: [id number]  HPI  Here for wellness and f/u;  Overall doing ok;  Pt denies Chest pain, worsening SOB, DOE, wheezing, orthopnea, PND, worsening LE edema, palpitations, dizziness or syncope.  Pt denies neurological change such as new headache, facial or extremity weakness.  Pt denies polydipsia, polyuria, or low sugar symptoms. Pt states overall good compliance with treatment and medications, good tolerability, and has been trying to follow appropriate diet.  Pt denies worsening depressive symptoms, suicidal ideation or panic. No fever, night sweats, wt loss, loss of appetite, or other constitutional symptoms.  Pt states good ability with ADL's, has low fall risk, home safety reviewed and adequate, no other significant changes in hearing or vision, and only occasionally active with exercise.  No new complaints or interval change. Pt continues to have recurring LBP without change in severity, bowel or bladder change, fever, wt loss,  worsening LE pain/numbness/weakness, gait change or falls.   Past Medical History:  Diagnosis Date  . Anxiety   . BPH (benign prostatic hypertrophy)   . Chronic lower back pain    nerve damage w/ leg pain  . Depression   . Family history of adverse reaction to anesthesia    MOTHER--- HARD TO WAKE  . History of panic attacks   . Hyperlipidemia   . Hypertension   . Meatal stenosis   . OSA on CPAP    moderate to severe OSA  per study 04-28-2007 uses CPAP  . PONV (postoperative nausea and vomiting)    Past Surgical History:  Procedure Laterality Date  . APPENDECTOMY  2003  . BREATH TEK H PYLORI  04/27/2012   Procedure: BREATH TEK H PYLORI;  Surgeon: Valarie Merino, MD;  [facility];  Service: General;  Laterality: N/A;  . CYSTOSCOPY WITH URETHRAL DILATATION N/A 04/12/2015   Procedure: CYSTOSCOPY WITH URETHRAL DILATATION, RETROGRADE AND URETHROGRAM WITH BLADDER BIOPSY;   Surgeon: Bjorn Pippin, MD;  [facility];  Service: Urology;  Laterality: N/A;  . ROTATOR CUFF REPAIR Right 01-21-2012  . TRANSTHORACIC ECHOCARDIOGRAM  03-26-2007   normal LVSF, ef 60%,  mild AV calcification without stenosis,  mild MR and TR,  mild LAE  . URETHROGRAM N/A 04/12/2015   Procedure: URETHROGRAM;  Surgeon: Bjorn Pippin, MD;  Location: [facility];  Service: Urology;  Laterality: N/A;    reports that he has never smoked. He has never used smokeless tobacco. He reports that he drinks alcohol. He reports that he does not use drugs. family history includes Cancer in his father; Coronary artery disease in his father; Diabetes in his other; Hyperlipidemia in his other; Hypertension in his other; Stroke in his other. Allergies  Allergen Reactions  . Flomax [Tamsulosin Hcl] Rash   Current Outpatient Medications on File Prior to Visit  Medication Sig Dispense Refill  . ALPRAZolam (XANAX) 0.5 MG tablet Take 1 tablet (0.5 mg total) by mouth 2 (two) times daily as needed for anxiety. 10 tablet 0  . amLODipine (NORVASC) 10 MG tablet Take 5 mg by mouth every morning.    Marland Kitchen amLODipine (NORVASC) 5 MG tablet TAKE ONE TABLET BY MOUTH ONCE DAILY 90 tablet 3  . doxazosin (CARDURA) 4 MG tablet Take 1 tablet (4 mg total) by mouth at bedtime. 90 tablet 3  . DULoxetine (CYMBALTA) 60 MG capsule Take 1 capsule (60 mg total) by mouth every morning. 90 capsule  3  . hydrochlorothiazide (HYDRODIURIL) 25 MG tablet TAKE ONE-HALF TABLET BY MOUTH ONCE DAILY 45 tablet 7  . HYDROcodone-homatropine (HYCODAN) 5-1.5 MG/5ML syrup Take 5 mLs by mouth every 8 (eight) hours as needed for cough. 120 mL 0  . KLOR-CON M20 20 MEQ tablet TAKE ONE-HALF TABLET BY MOUTH ONCE DAILY 45 tablet 3  . LORazepam (ATIVAN) 0.5 MG tablet TAKE 1 TABLET BY MOUTH TWICE DAILY AS NEEDED FOR ANXIETY 60 tablet 5  . Lycopene 10 MG CAPS Take 1 capsule by mouth every evening.    . Omega 3-6-9 Fatty Acids (OMEGA 3-6-9  COMPLEX PO) Take 1 capsule by mouth daily.     Marland Kitchen oxyCODONE-acetaminophen (PERCOCET/ROXICET) 5-325 MG tablet Take 1 tablet by mouth daily as needed for severe pain. 30 tablet 0  . pravastatin (PRAVACHOL) 40 MG tablet TAKE ONE TABLET BY MOUTH ONCE DAILY 90 tablet 0  . [DISCONTINUED] potassium chloride (KLOR-CON 10) 10 MEQ CR tablet Take 1 tablet (10 mEq total) by mouth daily. 90 tablet 3   No current facility-administered medications on file prior to visit.    Review of Systems Constitutional: Negative for other unusual diaphoresis, sweats, appetite or weight changes HENT: Negative for other worsening hearing loss, ear pain, facial swelling, mouth sores or neck stiffness.   Eyes: Negative for other worsening pain, redness or other visual disturbance.  Respiratory: Negative for other stridor or swelling Cardiovascular: Negative for other palpitations or other chest pain  Gastrointestinal: Negative for worsening diarrhea or loose stools, blood in stool, distention or other pain Genitourinary: Negative for hematuria, flank pain or other change in urine volume.  Musculoskeletal: Negative for myalgias or other joint swelling.  Skin: Negative for other color change, or other wound or worsening drainage.  Neurological: Negative for other syncope or numbness. Hematological: Negative for other adenopathy or swelling Psychiatric/Behavioral: Negative for hallucinations, other worsening agitation, SI, self-injury, or new decreased concentration \\All  other system neg per pt    Objective:   Physical Exam BP 120/82   Pulse 76   Temp 98.3 F (36.8 C) (Oral)   Ht  (1.549 m)   Wt 281 lb (127.5 kg)   SpO2 98%   BMI 53.09 kg/m \\VS  noted,  Constitutional: Pt is oriented to person, place, and time. Appears well-developed and well-nourished, in no significant distress and comfortable Head: Normocephalic and atraumatic  Eyes: Conjunctivae and EOM are normal. Pupils are equal, round, and reactive to  light Right Ear: External ear normal without discharge Left Ear: External ear normal without discharge Nose: Nose without discharge or deformity Mouth/Throat: Oropharynx is without other ulcerations and moist  Neck: Normal range of motion. Neck supple. No JVD present. No tracheal deviation present or significant neck LA or mass Cardiovascular: Normal rate, regular rhythm, normal heart sounds and intact distal pulses.   Pulmonary/Chest: WOB normal and breath sounds without rales or wheezing  Abdominal: Soft. Bowel sounds are normal. NT. No HSM  Musculoskeletal: Normal range of motion. Exhibits no edema Lymphadenopathy: Has no other cervical adenopathy.  Neurological: Pt is alert and oriented to person, place, and time. Pt has normal reflexes. No cranial nerve deficit. Motor grossly intact, Gait intact Skin: Skin is warm and dry. No rash noted or new ulcerations Psychiatric:  Has normal mood and affect. Behavior is normal without agitation No other exam findings       Assessment & Plan:

## 2018-01-26 NOTE — Patient Instructions (Addendum)
We will try to sign you up for the Cologuard  Please continue all other medications as before, and refills have been done if requested.  Please have the pharmacy call with any other refills you may need.  Please continue your efforts at being more active, low cholesterol diet, and weight control.  You are otherwise up to date with prevention measures today.  Please keep your appointments with your specialists as you may have planned  Please go to the LAB in the Basement (turn left off the elevator) for the tests to be done today  You will be contacted by phone if any changes need to be made immediately.  Otherwise, you will receive a letter about your results with an explanation, but please check with MyChart first.  Please remember to sign up for MyChart if you have not done so, as this will be important to you in the future with finding out test results, communicating by private email, and scheduling acute appointments online when needed.  Please return in 6 months, or sooner if needed

## 2018-01-31 NOTE — Assessment & Plan Note (Signed)

## 2018-01-31 NOTE — Assessment & Plan Note (Signed)
stable overall by history and exam, recent data reviewed with pt, and pt to continue medical treatment as before,  to f/u any worsening symptoms or concerns BP Readings from Last 3 Encounters:  01/26/18 120/82  07/28/17 126/88  01/21/17 114/80

## 2018-01-31 NOTE — Assessment & Plan Note (Signed)
stable overall by history and exam, recent data reviewed with pt, and pt to continue medical treatment as before,  to f/u any worsening symptoms or concerns Lab Results  Component Value Date   HGBA1C 5.7 01/26/2018

## 2018-02-06 ENCOUNTER — Other Ambulatory Visit: Payer: Self-pay | Admitting: Internal Medicine

## 2018-02-09 ENCOUNTER — Telehealth: Payer: Self-pay

## 2018-02-09 MED ORDER — OXYCODONE-ACETAMINOPHEN 5-325 MG PO TABS: 1.0000 | ORAL_TABLET | Freq: Every day | ORAL | 0 refills | Status: DC | PRN

## 2018-02-09 NOTE — Telephone Encounter (Signed)
Done erx 

## 2018-02-09 NOTE — Addendum Note (Signed)
Addended by: Corwin LevinsJOHN, JAMES W on: 02/09/2018 12:37 PM   Modules accepted: Orders

## 2018-02-09 NOTE — Telephone Encounter (Signed)
Copied from CRM [id number] [phone]. Topic: Quick Communication - Rx Refill/Question >> Feb 09, 2018  9:30 AM Percival SpanishKennedy, Cheryl W wrote: Medication   oxyCODONE-acetaminophen (PERCOCET/ROXICET) 5-325 MG tablet   Preferred [facility] [address]   Agent: Please be advised that RX refills may take up to 3 business days. We ask that you follow-up with your pharmacy.

## 2018-02-10 DIAGNOSIS — Z1211 Encounter for screening for malignant neoplasm of colon: Secondary | ICD-10-CM | POA: Diagnosis not present

## 2018-02-10 DIAGNOSIS — Z1212 Encounter for screening for malignant neoplasm of rectum: Secondary | ICD-10-CM | POA: Diagnosis not present

## 2018-02-10 LAB — COLOGUARD: Cologuard: NEGATIVE

## 2018-03-12 ENCOUNTER — Other Ambulatory Visit: Payer: Self-pay | Admitting: Internal Medicine

## 2018-03-12 MED ORDER — OXYCODONE-ACETAMINOPHEN 5-325 MG PO TABS: 1.0000 | ORAL_TABLET | Freq: Every day | ORAL | 0 refills | Status: DC | PRN

## 2018-03-12 NOTE — Telephone Encounter (Signed)
Rx refill request: oxycodone-acetaminophen 5-325 mg   Last filled: 02/09/18 #30  LOV: 01/26/18  PCP: Jonny RuizJohn  Pharmacy: verified

## 2018-03-12 NOTE — Telephone Encounter (Signed)
Done erx 

## 2018-03-12 NOTE — Telephone Encounter (Signed)
Copied from CRM [id number]. Topic: Inquiry >> Mar 12, 2018  7:27 AM Yvonna Alanisobinson, Andra M wrote: Reason for CRM: Patient's wife called requesting a refill of OxyCODONE-acetaminophen (PERCOCET/ROXICET) 5-325 MG tablet for the patient. [facility] [address], [place] - [address] [phone] (Phone)  [phone] (Fax).       Thank You!!!

## 2018-04-12 ENCOUNTER — Other Ambulatory Visit: Payer: Self-pay | Admitting: Internal Medicine

## 2018-04-12 MED ORDER — OXYCODONE-ACETAMINOPHEN 5-325 MG PO TABS: 1.0000 | ORAL_TABLET | Freq: Every day | ORAL | 0 refills | Status: DC | PRN

## 2018-04-12 NOTE — Telephone Encounter (Signed)
Refill of percocet  LOV 07/12/18 Dr[facility]Jones  [facility]  #[facility]  [facility]ills  [facility]  [address]

## 2018-04-12 NOTE — Telephone Encounter (Signed)
Done erx 

## 2018-04-12 NOTE — Telephone Encounter (Signed)
Copied from CRM #[id number]. Topic: Quick Communication - Rx Refill/Question >> Apr 12, 2018  8:23 AM Darletta MollL813 [phone] [id number], Lumin L wrote: Medication: oxyCODONE-acetaminophen (PERCOCET/ROXICET) 5-325 MG tablet  Has the patient contacted their pharmacy? Yes.   (Agent: If no, request that the patient contact the pharmacy for the refill.) (Agent: If yes, when and what did the pharmacy advise?)  Preferred Pharmacy (with phone number or street name): [facility] [place] - [address] [place] [address] Phone: [phone] Fax: [phone]   Agent: Please be advised that RX refills may take up to 3 business days. We ask that you follow-up with your pharmacy.

## 2018-05-11 ENCOUNTER — Other Ambulatory Visit: Payer: Self-pay | Admitting: Internal Medicine

## 2018-05-11 MED ORDER — OXYCODONE-ACETAMINOPHEN 5-325 MG PO TABS: 1.0000 | ORAL_TABLET | Freq: Every day | ORAL | 0 refills | Status: DC | PRN

## 2018-05-11 NOTE — Telephone Encounter (Signed)
Copied from CRM [id number]. Topic: Quick Communication - Rx Refill/Question >> May 11, 2018  2:39 PM Arlyss Gandy, NT wrote: Medication: oxyCODONE-acetaminophen (PERCOCET/ROXICET) 5-325 MG tablet   Has the patient contacted their pharmacy? Yes.   (Agent: If no, request that the patient contact the pharmacy for the refill.) (Agent: If yes, when and what did the pharmacy advise?)  Preferred Pharmacy (with phone number or street name): [facility] - [place], [place] - [address] [phone] (Phone) [phone] (Fax)    Agent: Please be advised that RX refills may take up to 3 business days. We ask that you follow-up with your pharmacy.

## 2018-05-11 NOTE — Telephone Encounter (Signed)
Done erx x 2 

## 2018-06-17 ENCOUNTER — Telehealth: Payer: Self-pay | Admitting: Internal Medicine

## 2018-06-17 MED ORDER — DOXAZOSIN MESYLATE 4 MG PO TABS: 4.0000 mg | ORAL_TABLET | Freq: Every day | ORAL | 3 refills | Status: DC

## 2018-06-17 NOTE — Telephone Encounter (Signed)
Copied from CRM [id number]. Topic: Quick Communication - See Telephone Encounter >> Jun 17, 2018  3:59 PM Trula Slade wrote: CRM for notification. See Telephone encounter for: 06/17/18. Patient's wife said the patient needs a refill on his Doxazosin medication.  Dr Donnetta Hail the patient's Urologist usually writes this prescription but he doesn't have an appt with him for 3 mths so they are asking his PCP to write the prescription and have it sent to [facility] [address]

## 2018-06-17 NOTE — Telephone Encounter (Signed)
dOne erx 

## 2018-06-17 NOTE — Addendum Note (Signed)
Addended by: Corwin Levins on: 06/17/2018 05:27 PM   Modules accepted: Orders

## 2018-07-07 ENCOUNTER — Other Ambulatory Visit: Payer: Self-pay | Admitting: Internal Medicine

## 2018-07-07 ENCOUNTER — Other Ambulatory Visit: Payer: Self-pay | Admitting: Emergency Medicine

## 2018-07-07 ENCOUNTER — Other Ambulatory Visit (INDEPENDENT_AMBULATORY_CARE_PROVIDER_SITE_OTHER): Payer: Medicare HMO

## 2018-07-07 ENCOUNTER — Telehealth: Payer: Self-pay | Admitting: Internal Medicine

## 2018-07-07 DIAGNOSIS — R300 Dysuria: Secondary | ICD-10-CM | POA: Diagnosis not present

## 2018-07-07 DIAGNOSIS — R739 Hyperglycemia, unspecified: Secondary | ICD-10-CM | POA: Diagnosis not present

## 2018-07-07 LAB — URINALYSIS, ROUTINE W REFLEX MICROSCOPIC
Bilirubin Urine: NEGATIVE
Ketones, ur: NEGATIVE
Nitrite: NEGATIVE
SPECIFIC GRAVITY, URINE: 1.015 (ref 1.000–1.030)
Total Protein, Urine: 30 — AB
Urine Glucose: NEGATIVE
Urobilinogen, UA: 0.2 (ref 0.0–1.0)
pH: 7 (ref 5.0–8.0)

## 2018-07-07 LAB — HEPATIC FUNCTION PANEL
ALBUMIN: 4.4 g/dL (ref 3.5–5.2)
ALT: 21 U/L (ref 0–53)
AST: 16 U/L (ref 0–37)
Alkaline Phosphatase: 49 U/L (ref 39–117)
BILIRUBIN TOTAL: 0.8 mg/dL (ref 0.2–1.2)
Bilirubin, Direct: 0.2 mg/dL (ref 0.0–0.3)
Total Protein: 7.2 g/dL (ref 6.0–8.3)

## 2018-07-07 LAB — LIPID PANEL
CHOL/HDL RATIO: 4
CHOLESTEROL: 193 mg/dL (ref 0–200)
HDL: 44.7 mg/dL (ref >39.00)
LDL CALC: 128 mg/dL — AB (ref 0–99)
NonHDL: 147.92
TRIGLYCERIDES: 101 mg/dL (ref 0.0–149.0)
VLDL: 20.2 mg/dL (ref 0.0–40.0)

## 2018-07-07 LAB — BASIC METABOLIC PANEL
BUN: 21 mg/dL (ref 6–23)
CHLORIDE: 101 meq/L (ref 96–112)
CO2: 30 meq/L (ref 19–32)
Calcium: 9.5 mg/dL (ref 8.4–10.5)
Creatinine, Ser: 1.51 mg/dL — ABNORMAL HIGH (ref 0.40–1.50)
GFR: 51.97 mL/min — ABNORMAL LOW (ref >60.00)
GLUCOSE: 116 mg/dL — AB (ref 70–99)
Potassium: 3.6 mEq/L (ref 3.5–5.1)
SODIUM: 139 meq/L (ref 135–145)

## 2018-07-07 LAB — HEMOGLOBIN A1C: Hgb A1c MFr Bld: 5.9 % (ref 4.6–6.5)

## 2018-07-07 MED ORDER — LEVOFLOXACIN 500 MG PO TABS: 500.0000 mg | ORAL_TABLET | Freq: Every day | ORAL | 0 refills | Status: AC

## 2018-07-07 NOTE — Telephone Encounter (Signed)
Labs ordered.

## 2018-07-07 NOTE — Telephone Encounter (Signed)
Copied from CRM [id number]. Topic: General - Other >> Jul 07, 2018  8:07 AM Gerrianne Scale wrote: Reason for CRM: pt calling wanting to come in for a urine sample when he come in for blood work he has burning when he urinate he has had a UTI before and he states that he knows what this is he like to use the  [facility] [address] [place], [place] - [address] [phone] (Phone) [phone] (Fax)

## 2018-07-09 LAB — URINE CULTURE
MICRO NUMBER: 91336533
Result:: NO GROWTH
SPECIMEN QUALITY:: ADEQUATE

## 2018-07-12 ENCOUNTER — Ambulatory Visit (INDEPENDENT_AMBULATORY_CARE_PROVIDER_SITE_OTHER): Payer: Medicare HMO | Admitting: Internal Medicine

## 2018-07-12 VITALS — BP 118/76 | HR 98 | Temp 98.2°F | Ht 61.0 in | Wt 271.0 lb

## 2018-07-12 DIAGNOSIS — Z0000 Encounter for general adult medical examination without abnormal findings: Secondary | ICD-10-CM

## 2018-07-12 DIAGNOSIS — N183 Chronic kidney disease, stage 3 unspecified: Secondary | ICD-10-CM

## 2018-07-12 DIAGNOSIS — I10 Essential (primary) hypertension: Secondary | ICD-10-CM

## 2018-07-12 DIAGNOSIS — Z23 Encounter for immunization: Secondary | ICD-10-CM | POA: Diagnosis not present

## 2018-07-12 DIAGNOSIS — E785 Hyperlipidemia, unspecified: Secondary | ICD-10-CM

## 2018-07-12 DIAGNOSIS — R739 Hyperglycemia, unspecified: Secondary | ICD-10-CM

## 2018-07-12 MED ORDER — OXYCODONE-ACETAMINOPHEN 5-325 MG PO TABS: 1.0000 | ORAL_TABLET | Freq: Every day | ORAL | 0 refills | Status: DC | PRN

## 2018-07-12 MED ORDER — ROSUVASTATIN CALCIUM 20 MG PO TABS: 20.0000 mg | ORAL_TABLET | Freq: Every day | ORAL | 3 refills | Status: DC

## 2018-07-12 MED ORDER — BENAZEPRIL HCL 40 MG PO TABS: 40.0000 mg | ORAL_TABLET | Freq: Every day | ORAL | 3 refills | Status: DC

## 2018-07-12 NOTE — Patient Instructions (Addendum)
You had the flu shot today  Ok to change the pravastatin to crestor 20 mg per day  You will be contacted regarding the referral for: Ct Cardiac Scoring  Please continue all other medications as before, and refills have been done if requested.  Please have the pharmacy call with any other refills you may need.  Please keep your appointments with your specialists as you may have planned  Please return in 6 months, or sooner if needed, with Lab testing done 3-5 days before

## 2018-07-12 NOTE — Assessment & Plan Note (Addendum)
stable overall by history and exam, recent data reviewed with pt, and pt to change pravachol to crestor 20 qd,  to f/u any worsening symptoms or concerns, for CT cardiac scoring per pt request

## 2018-07-12 NOTE — Progress Notes (Signed)
Subjective:    Patient ID: Darren Black, male    DOB: [birth year], 52 y.o.   MRN: [id number]  HPI  Here to f/u; overall doing ok,  Pt denies chest pain, increasing sob or doe, wheezing, orthopnea, PND, increased LE swelling, palpitations, dizziness or syncope.  Pt denies new neurological symptoms such as new headache, or facial or extremity weakness or numbness.  Pt denies polydipsia, polyuria, or low sugar episode.  Pt states overall good compliance with meds, mostly trying to follow appropriate diet, with wt overall stable,  but little exercise however.  Has strong FH or 1st and 2nd degree relatives with early CV disease, asks for CT cardiac scoring, willing to pay out of pocket if necessary Wt Readings from Last 3 Encounters:  07/12/18 271 lb (122.9 kg)  01/26/18 281 lb (127.5 kg)  07/28/17 281 lb (127.5 kg)   Past Medical History:  Diagnosis Date  . Anxiety   . BPH (benign prostatic hypertrophy)   . Chronic lower back pain    nerve damage w/ leg pain  . Depression   . Family history of adverse reaction to anesthesia    MOTHER--- HARD TO WAKE  . History of panic attacks   . Hyperlipidemia   . Hypertension   . Meatal stenosis   . OSA on CPAP    moderate to severe OSA  per study 04-28-2007 uses CPAP  . PONV (postoperative nausea and vomiting)    Past Surgical History:  Procedure Laterality Date  . APPENDECTOMY  2003  . BREATH TEK H PYLORI  04/27/2012   Procedure: BREATH TEK H PYLORI;  Surgeon: Valarie Merino, MD;  Location: [facility];  Service: General;  Laterality: N/A;  . CYSTOSCOPY WITH URETHRAL DILATATION N/A 04/12/2015   Procedure: CYSTOSCOPY WITH URETHRAL DILATATION, RETROGRADE AND URETHROGRAM WITH BLADDER BIOPSY;  Surgeon: Bjorn Pippin, MD;  Location[facility];  Service: Urology;  Laterality: N/A;  . ROTATOR CUFF REPAIR Right 01-21-2012  . TRANSTHORACIC ECHOCARDIOGRAM  03-26-2007   normal LVSF, ef 60%,  mild AV calcification without stenosis,  mild MR  and TR,  mild LAE  . URETHROGRAM N/A 04/12/2015   Procedure: URETHROGRAM;  Surgeon: Bjorn Pippin, MD;  Location: [facility];  Service: Urology;  Laterality: N/A;    reports that he has never smoked. He has never used smokeless tobacco. He reports that he drinks alcohol. He reports that he does not use drugs. family history includes Cancer in his father; Coronary artery disease in his father; Diabetes in his other; Hyperlipidemia in his other; Hypertension in his other; Stroke in his other. Allergies  Allergen Reactions  . Flomax [Tamsulosin Hcl] Rash   Current Outpatient Medications on File Prior to Visit  Medication Sig Dispense Refill  . ALPRAZolam (XANAX) 0.5 MG tablet Take 1 tablet (0.5 mg total) by mouth 2 (two) times daily as needed for anxiety. 10 tablet 0  . amLODipine (NORVASC) 5 MG tablet TAKE 1 TABLET BY MOUTH ONCE DAILY 90 tablet 3  . doxazosin (CARDURA) 4 MG tablet Take 1 tablet (4 mg total) by mouth at bedtime. 90 tablet 3  . DULoxetine (CYMBALTA) 60 MG capsule Take 1 capsule (60 mg total) by mouth every morning. 90 capsule 3  . fluticasone (FLONASE) 50 MCG/ACT nasal spray Place 2 sprays into both nostrils daily. 16 g 6  . hydrochlorothiazide (HYDRODIURIL) 25 MG tablet TAKE ONE-HALF TABLET BY MOUTH ONCE DAILY 45 tablet 7  . HYDROcodone-homatropine (HYCODAN) 5-1.5 MG/5ML syrup Take  5 mLs by mouth every 8 (eight) hours as needed for cough. 120 mL 0  . levofloxacin (LEVAQUIN) 500 MG tablet Take 1 tablet (500 mg total) by mouth daily for 10 days. 10 tablet 0  . LORazepam (ATIVAN) 0.5 MG tablet TAKE 1 TABLET BY MOUTH TWICE DAILY AS NEEDED FOR ANXIETY 60 tablet 5  . Lycopene 10 MG CAPS Take 1 capsule by mouth every evening.    . Omega 3-6-9 Fatty Acids (OMEGA 3-6-9 COMPLEX PO) Take 1 capsule by mouth daily.     . potassium chloride SA (K-DUR,KLOR-CON) 20 MEQ tablet TAKE 1/2 (ONE-HALF) TABLET BY MOUTH ONCE DAILY 45 tablet 3  . [DISCONTINUED] potassium chloride (KLOR-CON  10) 10 MEQ CR tablet Take 1 tablet (10 mEq total) by mouth daily. 90 tablet 3   No current facility-administered medications on file prior to visit.    Review of Systems  Constitutional: Negative for other unusual diaphoresis or sweats HENT: Negative for ear discharge or swelling Eyes: Negative for other worsening visual disturbances Respiratory: Negative for stridor or other swelling  Gastrointestinal: Negative for worsening distension or other blood Genitourinary: Negative for retention or other urinary change Musculoskeletal: Negative for other MSK pain or swelling Skin: Negative for color change or other new lesions Neurological: Negative for worsening tremors and other numbness  Psychiatric/Behavioral: Negative for worsening agitation or other fatigue All other system neg per pt    Objective:   Physical Exam BP 118/76   Pulse 98   Temp 98.2 F (36.8 C) (Oral)   Ht 5\' 1"  (1.549 m)   Wt 271 lb (122.9 kg)   SpO2 97%   BMI 51.21 kg/m  VS noted,  Constitutional: Pt appears in NAD HENT: Head: NCAT.  Right Ear: External ear normal.  Left Ear: External ear normal.  Eyes: . Pupils are equal, round, and reactive to light. Conjunctivae and EOM are normal Nose: without d/c or deformity Neck: Neck supple. Gross normal ROM Cardiovascular: Normal rate and regular rhythm.   Pulmonary/Chest: Effort normal and breath sounds without rales or wheezing.  Abd:  Soft, NT, ND, + BS, no organomegaly Neurological: Pt is alert. At baseline orientation, motor grossly intact Skin: Skin is warm. No rashes, other new lesions, no LE edema Psychiatric: Pt behavior is normal without agitation  No other exam findings Lab Results  Component Value Date   WBC 7.3 01/26/2018   HGB 13.6 01/26/2018   HCT 40.4 01/26/2018   PLT 219.0 01/26/2018   GLUCOSE 116 (H) 07/07/2018   CHOL 193 07/07/2018   TRIG 101.0 07/07/2018   HDL 44.70 07/07/2018   LDLDIRECT 139.0 01/02/2016   LDLCALC 128 (H) 07/07/2018    ALT 21 07/07/2018   AST 16 07/07/2018   NA 139 07/07/2018   K 3.6 07/07/2018   CL 101 07/07/2018   CREATININE 1.51 (H) 07/07/2018   BUN 21 07/07/2018   CO2 30 07/07/2018   TSH 1.90 01/26/2018   PSA 1.11 01/26/2018   HGBA1C 5.9 07/07/2018       Assessment & Plan:

## 2018-07-12 NOTE — Assessment & Plan Note (Signed)
stable overall by history and exam, recent data reviewed with pt, and pt to continue medical treatment as before,  to f/u any worsening symptoms or concerns  

## 2018-08-03 ENCOUNTER — Ambulatory Visit
Admission: RE | Admit: 2018-08-03 | Discharge: 2018-08-03 | Disposition: A | Discharge status: Home / Self Care | Payer: Self-pay | Source: Ambulatory Visit | Attending: Internal Medicine | Admitting: Internal Medicine

## 2018-08-03 ENCOUNTER — Inpatient Hospital Stay: Admission: RE | Admit: 2018-08-03 | Payer: Self-pay | Source: Ambulatory Visit

## 2018-08-03 DIAGNOSIS — I10 Essential (primary) hypertension: Secondary | ICD-10-CM

## 2018-08-03 DIAGNOSIS — E785 Hyperlipidemia, unspecified: Secondary | ICD-10-CM

## 2018-08-03 DIAGNOSIS — R739 Hyperglycemia, unspecified: Secondary | ICD-10-CM

## 2018-08-10 ENCOUNTER — Other Ambulatory Visit: Payer: Self-pay | Admitting: Internal Medicine

## 2018-08-10 ENCOUNTER — Encounter: Payer: Self-pay | Admitting: Internal Medicine

## 2018-08-10 MED ORDER — OXYCODONE-ACETAMINOPHEN 5-325 MG PO TABS: 1.0000 | ORAL_TABLET | Freq: Every day | ORAL | 0 refills | Status: DC | PRN

## 2018-08-10 NOTE — Telephone Encounter (Signed)
Copied from CRM [id number]. Topic: General - Other >> Aug 10, 2018  9:17 AM Ronney LionArrington, Shykila A wrote: Medication: oxyCODONE-acetaminophen (PERCOCET/ROXICET) 5-325 MG tablet  Has the patient contacted their pharmacy? Yes  Preferred Pharmacy (with phone number or street name): [facility] [place] - [address]  [phone] (Phone) [phone] (Fax)    Agent: Please be advised that RX refills may take up to 3 business days. We ask that you follow-up with your pharmacy.

## 2018-08-10 NOTE — Telephone Encounter (Signed)
Done erx 

## 2018-09-06 ENCOUNTER — Ambulatory Visit: Payer: Self-pay | Admitting: Internal Medicine

## 2018-09-06 ENCOUNTER — Encounter: Payer: Self-pay | Admitting: Internal Medicine

## 2018-09-06 NOTE — Telephone Encounter (Signed)
Unfortunately the pravastatin was not effective, so no need to go back.  He should be advised to take every other day for now, as this relieves the discomfort in many patients

## 2018-09-06 NOTE — Telephone Encounter (Signed)
Copied from CRM [id number]. Topic: Quick Communication - Rx Refill/Question >> Sep 06, 2018 10:02 AM Marylen Ponto wrote: Medication: oxyCODONE-acetaminophen (PERCOCET/ROXICET) 5-325 MG tablet  Has the patient contacted their pharmacy? no  Preferred Pharmacy (with phone number or street name): [facility] - [place], [place] - [address] [place] [phone] (Phone)  [phone] (Fax)  Agent: Please be advised that RX refills may take up to 3 business days. We ask that you follow-up with your pharmacy.

## 2018-09-06 NOTE — Telephone Encounter (Signed)
Pt wife Pollyann Savoy stated pt would like to change medications from the rosuvastatin (CRESTOR) 20 MG tablet and go back to taking pravastatin (PRAVACHOL) 40 MG tablet. Pt wife stated that the rosuvastatin is making pt sick. Pt complains of aching all over and not feeling well when he takes it. Cb# [phone] / No answer when I attempted to call back. / Routing to practice to make provider aware /

## 2018-09-07 NOTE — Telephone Encounter (Signed)
This encounter was created in error - please disregard.

## 2018-09-07 NOTE — Progress Notes (Signed)
Requested medication (s) are due for refill today:  Yes Requested medication (s) are on the active medication list: yes   Last refill:  08/01/18  Future visit scheduled: yes  Notes to clinic:       There are no medications in this encounter.

## 2018-09-07 NOTE — Progress Notes (Signed)
Sending to CMA 

## 2018-09-07 NOTE — Telephone Encounter (Signed)
Called pt, LVM.   

## 2018-09-08 ENCOUNTER — Other Ambulatory Visit: Payer: Self-pay

## 2018-09-08 MED ORDER — OXYCODONE-ACETAMINOPHEN 5-325 MG PO TABS: 1.0000 | ORAL_TABLET | Freq: Every day | ORAL | 0 refills | Status: DC | PRN

## 2018-09-08 NOTE — Telephone Encounter (Signed)
Done erx 

## 2018-09-14 ENCOUNTER — Telehealth: Payer: Self-pay

## 2018-09-14 NOTE — Telephone Encounter (Signed)
Ok to stop rosuvastatin for now, work on [place] [employer]

## 2018-09-14 NOTE — Telephone Encounter (Signed)
Copied from CRM [id number]. Topic: General - Other >> Sep 14, 2018 10:56 AM Jilda Roche wrote: Reason for CRM: Patient's wife called stating that he tried taking the Rabostatin every other day and it is still making him sick, please advise  Best call back  is [phone]

## 2018-09-15 NOTE — Telephone Encounter (Signed)
Called pt, LVM with details from PCP below.  

## 2018-10-04 ENCOUNTER — Telehealth: Payer: Self-pay | Admitting: Internal Medicine

## 2018-10-04 MED ORDER — OXYCODONE-ACETAMINOPHEN 5-325 MG PO TABS: 1.0000 | ORAL_TABLET | Freq: Every day | ORAL | 0 refills | Status: DC | PRN

## 2018-10-04 NOTE — Telephone Encounter (Signed)
Copied from CRM [id number]. Topic: Quick Communication - Rx Refill/Question >> Oct 04, 2018  9:48 AM Maia Petties wrote: Medication: oxyCODONE-acetaminophen (PERCOCET/ROXICET) 5-325 MG tablet - pt wife called - pt has 3 days left - last fill 09/08/2018 #30  Has the patient contacted their pharmacy? Yes - pharmacy advised her to call MD  Preferred Pharmacy (with phone number or street name): [facility] - [place], [place] - [address] [phone] (Phone) [phone] (Fax)

## 2018-10-04 NOTE — Telephone Encounter (Signed)
Done erx 

## 2018-10-09 ENCOUNTER — Other Ambulatory Visit: Payer: Self-pay | Admitting: Internal Medicine

## 2018-10-22 ENCOUNTER — Telehealth: Payer: Self-pay | Admitting: Internal Medicine

## 2018-10-22 NOTE — Telephone Encounter (Signed)
Copied from CRM [id number]. Topic: Quick Communication - See Telephone Encounter >> Oct 22, 2018  4:17 PM Terisa Starr wrote: CRM for notification. See Telephone encounter for: 10/22/18.  Patient is requesting a new cpap machine and it will need to go to [facility] , [address], [place] - number [phone]

## 2018-10-22 NOTE — Telephone Encounter (Signed)
Copied from CRM [id number]. Topic: Quick Communication - See Telephone Encounter >> Oct 22, 2018  4:17 PM Terisa Starr wrote: CRM for notification. See Telephone encounter for: 10/22/18.  Patient is requesting a new cpap machine and it will need to go to [facility] , [address], [place] - number [phone] >> Oct 22, 2018  4:26 PM Jolayne Haines L wrote: Patient called them and they do not have cpap machines. She will call back with where she needs it to go

## 2018-10-23 NOTE — Telephone Encounter (Signed)
Very sorry, but as I dont treat CPAP, I am not responsible for the equipment or treatment.  I can refer you to pulmonary if you like.

## 2018-10-23 NOTE — Telephone Encounter (Signed)
Dr Keigan please advise.  

## 2018-10-25 ENCOUNTER — Telehealth: Payer: Self-pay | Admitting: Pulmonary Disease

## 2018-10-25 NOTE — Telephone Encounter (Addendum)
I called pt's wife, there was a busy signal X 3 when I called her back. The patient has not been seen here since 2017 and will need an appt to receive a Rx for CPAP. Will try to call wife again later

## 2018-10-25 NOTE — Telephone Encounter (Signed)
Called pt, LVM.   

## 2018-10-26 NOTE — Telephone Encounter (Signed)
ATC Patient. Received busy signal.

## 2018-10-27 NOTE — Telephone Encounter (Signed)
Busy signal

## 2018-10-28 ENCOUNTER — Encounter: Payer: Self-pay | Admitting: Pulmonary Disease

## 2018-10-28 ENCOUNTER — Ambulatory Visit: Payer: Medicare HMO | Admitting: Pulmonary Disease

## 2018-10-28 VITALS — BP 120/70 | HR 74 | Ht 67.0 in | Wt 264.8 lb

## 2018-10-28 DIAGNOSIS — I10 Essential (primary) hypertension: Secondary | ICD-10-CM | POA: Diagnosis not present

## 2018-10-28 DIAGNOSIS — G4733 Obstructive sleep apnea (adult) (pediatric): Secondary | ICD-10-CM | POA: Diagnosis not present

## 2018-10-28 NOTE — Assessment & Plan Note (Signed)
Controlled on 3 medications

## 2018-10-28 NOTE — Assessment & Plan Note (Signed)
He has high residual AHI, unclear why this is so because he has lost weight about 30 pounds since his last visit in 2017. He likely needs more pressure and we will change him to auto settings 10 to 15 cm and obtain download in 1 month and tweak settings as required.  If events persist he may need a titration study He is eligible for a new machine and we will write prescription accordingly.  He is compliant and CPAP is certainly helped improve his daytime somnolence and fatigue  Weight loss encouraged, compliance with goal of at least 4-6 hrs every night is the expectation. Advised against medications with sedative side effects Cautioned against driving when sleepy - understanding that sleepiness will vary on a day to day basis

## 2018-10-28 NOTE — Progress Notes (Signed)
   Subjective:    Patient ID: Darren Black, male    DOB: 06/12/1966, 53 y.o.   MRN: [id number]  HPI  52/M for FU of severe obstructive sleep apnea .   He was diagnosed in 2008 and initially started on CPAP of 12 cm. He complains of increased pressure and this was lowered to 10 cm.  He has settled down with full facemask. He has been lost to follow-up for the last 3 years.  He obtained a new machine in 2014. On his last visit in 2017, we noted that he did not have residual events on CPAP of 10 cm  He has lost weight from 298 pounds to his current weight of 264 pounds over the last 3 years He gets his supplies through Medicare but would like to get a new CPAP machine now.  He states that parts of his old machine are falling off  CPAP download was reviewed which shows residual AHI of 38/hour and 10 cm predominantly obstructive events, compliance is good about 8 hours every night and leak is minimal  Significant tests/ events reviewed  NPSG  2008 showed AHI 29/h, wt was 252 lbs, CPAP was initiated at 12 cm based on autotitration data, then lowered to 10 cm   Review of Systems neg for any significant sore throat, dysphagia, itching, sneezing, nasal congestion or excess/ purulent secretions, fever, chills, sweats, unintended wt loss, pleuritic or exertional cp, hempoptysis, orthopnea pnd or change in chronic leg swelling. Also denies presyncope, palpitations, heartburn, abdominal pain, nausea, vomiting, diarrhea or change in bowel or urinary habits, dysuria,hematuria, rash, arthralgias, visual complaints, headache, numbness weakness or ataxia.     Objective:   Physical Exam   Gen. Pleasant, obese, in no distress ENT - no lesions, no post nasal drip Neck: No JVD, no thyromegaly, no carotid bruits Lungs: no use of accessory muscles, no dullness to percussion, decreased without rales or rhonchi  Cardiovascular: Rhythm regular, heart sounds  normal, no murmurs or gallops, no peripheral  edema Musculoskeletal: No deformities, no cyanosis or clubbing , no tremors        Assessment & Plan:

## 2018-10-28 NOTE — Patient Instructions (Signed)
Prescription for new auto CPAP 10 to 15 cm will be sent to [facility]. We will check download in 1 month.

## 2018-11-02 ENCOUNTER — Other Ambulatory Visit: Payer: Self-pay | Admitting: Internal Medicine

## 2018-11-02 NOTE — Telephone Encounter (Signed)
Requested Prescriptions  Pending Prescriptions Disp Refills  . LORazepam (ATIVAN) 0.5 MG tablet [Pharmacy Med Name: LORazepam 0.5 MG Oral Tablet] 60 tablet 0    Sig: Take 1 tablet by mouth twice daily as needed for anxiety     Not Delegated - Psychiatry:  Anxiolytics/Hypnotics Failed - 11/02/2018 10:14 AM      Failed - This refill cannot be delegated      Failed - Urine Drug Screen completed in last 360 days.      Passed - Valid encounter within last 6 months    Recent Outpatient Visits          3 months ago Hyperglycemia   [facility] -Jaclyn Shaggy, Len Blalock, MD   9 months ago Preventative health care   [facility] -Jaclyn Shaggy, Len Blalock, MD   1 year ago Hyperglycemia   [facility] -Jaclyn Shaggy, Len Blalock, MD   1 year ago Preventative health care   [facility] -Clair Gulling, MD   1 year ago Influenza-like illness   [facility] -Willis Modena, MD      Future Appointments            In 2 months Jonny Ruiz, Len Blalock, MD [facility] -[place], [facility]   In 3 months [name], MD [facility] Care         . amLODipine (NORVASC) 5 MG tablet [Pharmacy Med Name: amLODIPine Besylate 5 MG Oral Tablet] 90 tablet 0    Sig: Take 1 tablet by mouth once daily     Cardiovascular:  Calcium Channel Blockers Passed - 11/02/2018 10:14 AM      Passed - Last BP in normal range    BP Readings from Last 1 Encounters:  10/28/18 120/70         Passed - Valid encounter within last 6 months    Recent Outpatient Visits          3 months ago Hyperglycemia   [facility] -Jaclyn Shaggy, Len Blalock, MD   9 months ago Preventative health care   [facility] -Jaclyn Shaggy, Len Blalock, MD   1 year ago Hyperglycemia    [facility] -[facility]lyn Sh[facility]gy, Len Blalock, MD   1 year ago Preventative health care   [facility] -Clair Gulling, MD   1 year ago Influenza-like illness    [facility] -Willis Modena, MD      Future Appointments            In 2 months Jonny Ruiz, Len Blalock, MD [facility] -[place], [facility]   In 3 months [name], MD [facility]monary Care

## 2018-11-02 NOTE — Telephone Encounter (Signed)
Done erx 

## 2018-11-02 NOTE — Telephone Encounter (Signed)
Requested medication (s) are due for refill today: yes   Requested medication (s) are on the active medication list: yes  Last refill:  05/11/18  Future visit scheduled: yes  Notes to clinic:  Not delegated    Requested Prescriptions  Pending Prescriptions Disp Refills   LORazepam (ATIVAN) 0.5 MG tablet [Pharmacy Med Name: LORazepam 0.5 MG Oral Tablet] 60 tablet 0    Sig: Take 1 tablet by mouth twice daily as needed for anxiety     Not Delegated - Psychiatry:  Anxiolytics/Hypnotics Failed - 11/02/2018 10:14 AM      Failed - This refill cannot be delegated      Failed - Urine Drug Screen completed in last 360 days.      Passed - Valid encounter within last 6 months    Recent Outpatient Visits          3 months ago Hyperglycemia   [facility] -Clair Gulling, MD   9 months ago Preventative health care   [facility] -Jaclyn Shaggy, Len Blalock, MD   1 year ago Hyperglycemia   [facility] -Clair Gulling, MD   1 year ago Preventative health care   [facility] Care -Clair Gulling, MD   1 year ago Influenza-like illness   [facility] -Willis Modena, MD      Future Appointments            In 2 months Jonny Ruiz, Len Blalock, MD [facility] -[place], [facility]   In 3 months [name], MD [facility] Care         Signed Prescriptions Disp Refills   amLODipine (NORVASC) 5 MG tablet 90 tablet 0    Sig: Take 1 tablet by mouth once daily     Cardiovascular:  Calcium Channel Blockers Passed - 11/02/2018 10:14 AM      Passed - Last BP in normal range    BP Readings from Last 1 Encounters:  10/28/18 120/70         Passed - Valid encounter within last 6 months    Recent Outpatient Visits          3 months ago Hyperglycemia   [facility] -Jaclyn Shaggy, Len Blalock, MD   9 months ago Preventative health care   [facility] Care  -Jaclyn Shaggy, Len Blalock, MD   1 year ago Hyperglycemia   [facility] -Jaclyn Shaggy, Len Blalock, MD   1 year ago Preventative health care   [facility] Care -Clair Gulling, MD   1 year ago Influenza-like illness   [facility] -Willis Modena, MD      Future Appointments            In 2 months Jonny Ruiz, Len Blalock, MD [facility] Primary Care -[place], [facility]   In 3 months [name], MD [facility] Pulmonary Care

## 2018-11-02 NOTE — Telephone Encounter (Signed)
Copied from CRM [id number]. Topic: Quick Communication - Rx Refill/Question >> Nov 02, 2018 10:13 AM Zada Girt, Lumin L wrote: Medication: oxyCODONE-acetaminophen (PERCOCET/ROXICET) 5-325 MG tablet  Has the patient contacted their pharmacy? Yes.   (Agent: If no, request that the patient contact the pharmacy for the refill.) (Agent: If yes, when and what did the pharmacy advise?)  Preferred Pharmacy (with phone number or street name): [facility] - [place], [place] - [address] [place] [address] Phone: [phone] Fax: [phone]  Agent: Please be advised that RX refills may take up to 3 business days. We ask that you follow-up with your pharmacy.

## 2018-11-02 NOTE — Telephone Encounter (Signed)
   LOV:07/12/18 NextOV:01/11/19 Last Filled/Quantity:10/04/18 60#

## 2018-11-10 ENCOUNTER — Telehealth: Payer: Self-pay | Admitting: Internal Medicine

## 2018-11-10 DIAGNOSIS — G4733 Obstructive sleep apnea (adult) (pediatric): Secondary | ICD-10-CM | POA: Diagnosis not present

## 2018-11-10 MED ORDER — OXYCODONE-ACETAMINOPHEN 5-325 MG PO TABS: 1.0000 | ORAL_TABLET | Freq: Every day | ORAL | 0 refills | Status: DC | PRN

## 2018-11-10 NOTE — Telephone Encounter (Signed)
Done erx 

## 2018-11-10 NOTE — Addendum Note (Signed)
Addended by: Corwin Levins on: 11/10/2018 12:54 PM   Modules accepted: Orders

## 2018-11-10 NOTE — Telephone Encounter (Signed)
Copied from CRM [id number]. Topic: Quick Communication - Rx Refill/Question >> Nov 02, 2018 10:13 AM Zada Girt, Lumin L wrote: Medication: oxyCODONE-acetaminophen (PERCOCET/ROXICET) 5-325 MG tablet  Has the patient contacted their pharmacy? Yes.   (Agent: If no, request that the patient contact the pharmacy for the refill.) (Agent: If yes, when and what did the pharmacy advise?)  Preferred Pharmacy (with phone number or street name): [facility] - [place], [place] - [address] [place] [address] Phone: [phone] Fax: [phone]  Agent: Please be advised that RX refills may take up to 3 business days. We ask that you follow-up with your pharmacy. >> Nov 10, 2018 10:28 AM Floria Raveling A wrote: Pt wife called back in about this refill, she stated that the pharmacy does not have this med.  Please advise   [facility] [address]

## 2018-12-01 ENCOUNTER — Telehealth: Payer: Self-pay | Admitting: Internal Medicine

## 2018-12-01 NOTE — Telephone Encounter (Signed)
Virtual Visit has been made for 12/02/18.

## 2018-12-01 NOTE — Telephone Encounter (Signed)
Noted  

## 2018-12-01 NOTE — Telephone Encounter (Signed)
[name] wife left a vm stating that pt has been having increase panic attacks and feels he needs an increase in dose of his lorazepam. If someone could reach out to patient

## 2018-12-02 ENCOUNTER — Ambulatory Visit (INDEPENDENT_AMBULATORY_CARE_PROVIDER_SITE_OTHER): Payer: Medicare HMO | Admitting: Internal Medicine

## 2018-12-02 DIAGNOSIS — F411 Generalized anxiety disorder: Secondary | ICD-10-CM | POA: Diagnosis not present

## 2018-12-02 DIAGNOSIS — R739 Hyperglycemia, unspecified: Secondary | ICD-10-CM

## 2018-12-02 DIAGNOSIS — R69 Illness, unspecified: Secondary | ICD-10-CM | POA: Diagnosis not present

## 2018-12-02 DIAGNOSIS — I10 Essential (primary) hypertension: Secondary | ICD-10-CM

## 2018-12-02 MED ORDER — LORAZEPAM 1 MG PO TABS: 1.0000 mg | ORAL_TABLET | Freq: Two times a day (BID) | ORAL | 2 refills | Status: DC | PRN

## 2018-12-02 NOTE — Progress Notes (Signed)
Patient ID: Darren Black, male   DOB: 1965/12/13, 53 y.o.   MRN: [id number]  Virtual Visit via Video Note  I connected with Darren Black on 12/02/18 at  2:00 PM EDT by a video enabled telemedicine application and verified that I am speaking with the correct person using two identifiers. Pt is at home, I am in office, and wife kalliopi is present as well.  Pt well able to give hx   I discussed the limitations of evaluation and management by telemedicine and the availability of in person appointments. The patient expressed understanding and agreed to proceed.  History of Present Illness: Here with c/o increased anxiety and near panic symptoms of shakiness, mind racing, palpitations, general weakness, mild SOB but Pt denies CP, wheezing, orthopnea, PND, increased LE swelling, palpitations, dizziness or syncope.   Pt denies fever, wt loss, night sweats, loss of appetite, or other constitutional symptoms .  Was doing well until 3 wks ago with the pandemic started.  He has sister and mother in [place], son in [place], and daughter with baby in [place], just cant turn his mind off to get to sleep as well.  Current lorazepam 0.5 bid prn not working now,  Is amenable to counseling referral even virtual visit, and admits he has been staying indoors almost exclusively for 3 wks as well, fearful of going outside for his daily walks given the shelter in place and doesn't want to get in trouble with police or illness.  Has had no frank panic to the point he goes to ED.  BP at home on a regular basis < 130/90 per wife.   Pt denies polydipsia, polyuria,   Overall chronic LBP no change.  Denies worsening depressive symptoms, suicidal ideation. Past Medical History:  Diagnosis Date  . Anxiety   . BPH (benign prostatic hypertrophy)   . Chronic lower back pain    nerve damage w/ leg pain  . Depression   . Family history of adverse reaction to anesthesia    MOTHER--- HARD TO WAKE  . History of panic attacks   .  Hyperlipidemia   . Hypertension   . Meatal stenosis   . OSA on CPAP    moderate to severe OSA  per study 04-28-2007 uses CPAP  . PONV (postoperative nausea and vomiting)    Past Surgical History:  Procedure Laterality Date  . APPENDECTOMY  2003  . BREATH TEK H PYLORI  04/27/2012   Procedure: BREATH TEK H PYLORI;  Surgeon: Valarie Merino, MD;  Location: [facility];  Service: General;  Laterality: N/A;  . CYSTOSCOPY WITH URETHRAL DILATATION N/A 04/12/2015   Procedure: CYSTOSCOPY WITH URETHRAL DILATATION, RETROGRADE AND URETHROGRAM WITH BLADDER BIOPSY;  Surgeon: Bjorn Pippin, MD;  Location: [facility];  Service: Urology;  Laterality: N/A;  . ROTATOR CUFF REPAIR Right 01-21-2012  . TRANSTHORACIC ECHOCARDIOGRAM  03-26-2007   normal LVSF, ef 60%,  mild AV calcification without stenosis,  mild MR and TR,  mild LAE  . URETHROGRAM N/A 04/12/2015   Procedure: URETHROGRAM;  Surgeon: Bjorn Pippin, MD;  Location: [facility];  Service: Urology;  Laterality: N/A;    reports that he has never smoked. He has never used smokeless tobacco. He reports current alcohol use. He reports that he does not use drugs. family history includes Cancer in his father; Coronary artery disease in his father; Diabetes in an other family member; Hyperlipidemia in an other family member; Hypertension in an other family member; Stroke in an  other family member. Allergies  Allergen Reactions  . Flomax [Tamsulosin Hcl] Rash   Current Outpatient Medications on File Prior to Visit  Medication Sig Dispense Refill  . amLODipine (NORVASC) 5 MG tablet Take 1 tablet by mouth once daily 90 tablet 0  . benazepril (LOTENSIN) 40 MG tablet Take 1 tablet (40 mg total) by mouth daily. 90 tablet 3  . doxazosin (CARDURA) 4 MG tablet Take 1 tablet (4 mg total) by mouth at bedtime. 90 tablet 3  . DULoxetine (CYMBALTA) 60 MG capsule Take 1 capsule (60 mg total) by mouth every morning. 90 capsule 3  . fluticasone  (FLONASE) 50 MCG/ACT nasal spray Place 2 sprays into both nostrils daily. 16 g 6  . hydrochlorothiazide (HYDRODIURIL) 25 MG tablet TAKE 1/2 (ONE-HALF) TABLET BY MOUTH ONCE DAILY 45 tablet 2  . LORazepam (ATIVAN) 0.5 MG tablet Take 1 tablet by mouth twice daily as needed for anxiety 60 tablet 0  . Lycopene 10 MG CAPS Take 1 capsule by mouth every evening.    . Omega 3-6-9 Fatty Acids (OMEGA 3-6-9 COMPLEX PO) Take 1 capsule by mouth daily.     Marland Kitchen oxyCODONE-acetaminophen (PERCOCET/ROXICET) 5-325 MG tablet Take 1 tablet by mouth daily as needed for severe pain. To fill Sep 09, 2018 30 tablet 0  . potassium chloride SA (K-DUR,KLOR-CON) 20 MEQ tablet TAKE 1/2 (ONE-HALF) TABLET BY MOUTH ONCE DAILY 45 tablet 3  . [DISCONTINUED] potassium chloride (KLOR-CON 10) 10 MEQ CR tablet Take 1 tablet (10 mEq total) by mouth daily. 90 tablet 3   No current facility-administered medications on file prior to visit.     Observations/Objective: Alert, obese, moderately nervous appearing, well groomed, not ill appearing, resps slightly higher but not sob or accessory muscle use, cn 2-12 intact, moves all 4s and walks about the room Lab Results  Component Value Date   WBC 7.3 01/26/2018   HGB 13.6 01/26/2018   HCT 40.4 01/26/2018   PLT 219.0 01/26/2018   GLUCOSE 116 (H) 07/07/2018   CHOL 193 07/07/2018   TRIG 101.0 07/07/2018   HDL 44.70 07/07/2018   LDLDIRECT 139.0 01/02/2016   LDLCALC 128 (H) 07/07/2018   ALT 21 07/07/2018   AST 16 07/07/2018   NA 139 07/07/2018   K 3.6 07/07/2018   CL 101 07/07/2018   CREATININE 1.51 (H) 07/07/2018   BUN 21 07/07/2018   CO2 30 07/07/2018   TSH 1.90 01/26/2018   PSA 1.11 01/26/2018   HGBA1C 5.9 07/07/2018   Assessment and Plan: Anxiety/panic - worsening with recent onset pandemic and activity restrictions, as well as family in the hot spots for infection; ok for increased lorazepam 1 mg bid (could consider tid if needed), cont cymbalta (good compliance with this), ok  for short walks outdoors while practicing social distancing, and will refer for counseling.    Follow Up Instructions: Pt for above, f/u prn; advised at some point, we will need to try to cut back again in the future on the lorazepam to avoid overuse and dependence   I discussed the assessment and treatment plan with the patient. The patient was provided an opportunity to ask questions and all were answered. The patient agreed with the plan and demonstrated an understanding of the instructions.   The patient was advised to call back or seek an in-person evaluation if the symptoms worsen or if the condition fails to improve as anticipated.   Oliver Barre, MD

## 2018-12-02 NOTE — Assessment & Plan Note (Signed)
stable overall by history and exam, recent data reviewed with pt, and pt to continue medical treatment as before,  to f/u any worsening symptoms or concerns  

## 2018-12-02 NOTE — Assessment & Plan Note (Signed)
With situational worsening, for increased lorazepam 1 mg bid, increased exercise, refer counseling,  to f/u any worsening symptoms or concerns

## 2018-12-02 NOTE — Patient Instructions (Signed)
See above

## 2018-12-08 ENCOUNTER — Other Ambulatory Visit: Payer: Self-pay | Admitting: Internal Medicine

## 2018-12-08 ENCOUNTER — Encounter: Payer: Self-pay | Admitting: Internal Medicine

## 2018-12-08 MED ORDER — TAMSULOSIN HCL 0.4 MG PO CAPS: 0.4000 mg | ORAL_CAPSULE | Freq: Every day | ORAL | 3 refills | Status: DC

## 2018-12-08 MED ORDER — OXYCODONE-ACETAMINOPHEN 5-325 MG PO TABS: 1.0000 | ORAL_TABLET | Freq: Every day | ORAL | 0 refills | Status: DC | PRN

## 2018-12-08 NOTE — Telephone Encounter (Signed)
Done erx 

## 2018-12-10 ENCOUNTER — Encounter: Payer: Self-pay | Admitting: Internal Medicine

## 2018-12-11 DIAGNOSIS — G4733 Obstructive sleep apnea (adult) (pediatric): Secondary | ICD-10-CM | POA: Diagnosis not present

## 2018-12-13 MED ORDER — OXYCODONE-ACETAMINOPHEN 5-325 MG PO TABS: 1.0000 | ORAL_TABLET | Freq: Every day | ORAL | 0 refills | Status: DC | PRN

## 2018-12-13 NOTE — Telephone Encounter (Signed)
Done erx 

## 2019-01-10 DIAGNOSIS — G4733 Obstructive sleep apnea (adult) (pediatric): Secondary | ICD-10-CM | POA: Diagnosis not present

## 2019-01-11 ENCOUNTER — Encounter: Payer: Self-pay | Admitting: Internal Medicine

## 2019-01-11 ENCOUNTER — Ambulatory Visit (INDEPENDENT_AMBULATORY_CARE_PROVIDER_SITE_OTHER): Payer: Medicare HMO | Admitting: Internal Medicine

## 2019-01-11 DIAGNOSIS — E291 Testicular hypofunction: Secondary | ICD-10-CM

## 2019-01-11 DIAGNOSIS — Z0000 Encounter for general adult medical examination without abnormal findings: Secondary | ICD-10-CM | POA: Diagnosis not present

## 2019-01-11 DIAGNOSIS — J309 Allergic rhinitis, unspecified: Secondary | ICD-10-CM

## 2019-01-11 DIAGNOSIS — R739 Hyperglycemia, unspecified: Secondary | ICD-10-CM

## 2019-01-11 DIAGNOSIS — E538 Deficiency of other specified B group vitamins: Secondary | ICD-10-CM | POA: Diagnosis not present

## 2019-01-11 DIAGNOSIS — E559 Vitamin D deficiency, unspecified: Secondary | ICD-10-CM

## 2019-01-11 MED ORDER — OXYCODONE-ACETAMINOPHEN 5-325 MG PO TABS: 1.0000 | ORAL_TABLET | Freq: Every day | ORAL | 0 refills | Status: DC | PRN

## 2019-01-11 MED ORDER — TRIAMCINOLONE ACETONIDE 55 MCG/ACT NA AERO: 2.0000 | INHALATION_SPRAY | Freq: Every day | NASAL | 12 refills | Status: DC

## 2019-01-11 NOTE — Patient Instructions (Addendum)
Please take all new medication as prescribed - the nasacort  Please continue all other medications as before, and refills have been done if requested - the oxycodone  Please have the pharmacy call with any other refills you may need.  Please continue your efforts at being more active, low cholesterol diet, and weight control.  You are otherwise up to date with prevention measures today.  Please keep your appointments with your specialists as you may have planned  Please go to the LAB in the Basement (turn left off the elevator) for the tests to be done tomorrow as you discussed  You will be contacted by phone if any changes need to be made immediately.  Otherwise, you will receive a letter about your results with an explanation, but please check with MyChart first.  Please remember to sign up for MyChart if you have not done so, as this will be important to you in the future with finding out test results, communicating by private email, and scheduling acute appointments online when needed.  Please return in 6 months, or sooner if needed, with Lab testing done 3-5 days before

## 2019-01-11 NOTE — Assessment & Plan Note (Signed)

## 2019-01-11 NOTE — Assessment & Plan Note (Signed)
stable overall by history and exam, recent data reviewed with pt, and pt to continue medical treatment as before,  to f/u any worsening symptoms or concerns, for a1c with labs 

## 2019-01-11 NOTE — Progress Notes (Signed)
Patient ID: Darren ShearerJohn Black, male   DOB: 06/20/1966, 53 y.o.   MRN: [id number]  Virtual Visit via Video Note  I connected with Darren Black on 01/11/19 at  2:20 PM EDT by a video enabled telemedicine application and verified that I am speaking with the correct person using two identifiers.  Location: Patient: at home with wife present Provider: at office   I discussed the limitations of evaluation and management by telemedicine and the availability of in person appointments. The patient expressed understanding and agreed to proceed.  History of Present Illness: Here for wellness and f/u;  Overall doing ok;  Pt denies Chest pain, worsening SOB, DOE, wheezing, orthopnea, PND, worsening LE edema, palpitations, dizziness or syncope.  Pt denies neurological change such as new headache, facial or extremity weakness.  Pt denies polydipsia, polyuria, or low sugar symptoms. Pt states overall good compliance with treatment and medications, good tolerability, and has been trying to follow appropriate diet.  Pt denies worsening depressive symptoms, suicidal ideation or panic. No fever, night sweats, wt loss, loss of appetite, or other constitutional symptoms.  Pt states good ability with ADL's, has low fall risk, home safety reviewed and adequate, no other significant changes in hearing or vision, and only occasionally active with exercise. No new complaints.  Needs pain med refill.  Except Does have several wks ongoing nasal allergy symptoms with clearish congestion, itch and sneezing, without fever, pain, ST, cough, swelling or wheezing. Past Medical History:  Diagnosis Date  . Anxiety   . BPH (benign prostatic hypertrophy)   . Chronic lower back pain    nerve damage w/ leg pain  . Depression   . Family history of adverse reaction to anesthesia    MOTHER--- HARD TO WAKE  . History of panic attacks   . Hyperlipidemia   . Hypertension   . Meatal stenosis   . OSA on CPAP    moderate to severe OSA  per  study 04-28-2007 uses CPAP  . PONV (postoperative nausea and vomiting)    Past Surgical History:  Procedure Laterality Date  . APPENDECTOMY  2003  . BREATH TEK H PYLORI  04/27/2012   Procedure: BREATH TEK H PYLORI;  Surgeon: Valarie MerinoMatthew B Martin, MD;  [facility];  Service: General;  Laterality: N/A;  . CYSTOSCOPY WITH URETHRAL DILATATION N/A 04/12/2015   Procedure: CYSTOSCOPY WITH URETHRAL DILATATION, RETROGRADE AND URETHROGRAM WITH BLADDER BIOPSY;  Surgeon: Bjorn PippinJohn Wrenn, MD;  [facility];  Service: Urology;  Laterality: N/A;  . ROTATOR CUFF REPAIR Right 01-21-2012  . TRANSTHORACIC ECHOCARDIOGRAM  03-26-2007   normal LVSF, ef 60%,  mild AV calcification without stenosis,  mild MR and TR,  mild LAE  . URETHROGRAM N/A 04/12/2015   Procedure: URETHROGRAM;  Surgeon: Bjorn PippinJohn Wrenn, MD;  Location[facility];  Service: Urology;  Laterality: N/A;    reports that he has never smoked. He has never used smokeless tobacco. He reports current alcohol use. He reports that he does not use drugs. family history includes Cancer in his father; Coronary artery disease in his father; Diabetes in an other family member; Hyperlipidemia in an other family member; Hypertension in an other family member; Stroke in an other family member. Allergies  Allergen Reactions  . Flomax [Tamsulosin Hcl] Rash   Current Outpatient Medications on File Prior to Visit  Medication Sig Dispense Refill  . amLODipine (NORVASC) 5 MG tablet Take 1 tablet by mouth once daily 90 tablet 0  . benazepril (LOTENSIN)  40 MG tablet Take 1 tablet (40 mg total) by mouth daily. 90 tablet 3  . doxazosin (CARDURA) 4 MG tablet Take 1 tablet (4 mg total) by mouth at bedtime. 90 tablet 3  . fluticasone (FLONASE) 50 MCG/ACT nasal spray Place 2 sprays into both nostrils daily. 16 g 6  . hydrochlorothiazide (HYDRODIURIL) 25 MG tablet TAKE 1/2 (ONE-HALF) TABLET BY MOUTH ONCE DAILY 45 tablet 2  . LORazepam  (ATIVAN) 1 MG tablet Take 1 tablet (1 mg total) by mouth 2 (two) times daily as needed for anxiety. 60 tablet 2  . Lycopene 10 MG CAPS Take 1 capsule by mouth every evening.    . Omega 3-6-9 Fatty Acids (OMEGA 3-6-9 COMPLEX PO) Take 1 capsule by mouth daily.     . potassium chloride SA (K-DUR,KLOR-CON) 20 MEQ tablet TAKE 1/2 (ONE-HALF) TABLET BY MOUTH ONCE DAILY 45 tablet 3  . tamsulosin (FLOMAX) 0.4 MG CAPS capsule Take 1 capsule (0.4 mg total) by mouth daily. 90 capsule 3  . DULoxetine (CYMBALTA) 60 MG capsule Take 1 capsule (60 mg total) by mouth every morning. 90 capsule 3  . [DISCONTINUED] potassium chloride (KLOR-CON 10) 10 MEQ CR tablet Take 1 tablet (10 mEq total) by mouth daily. 90 tablet 3   No current facility-administered medications on file prior to visit.     Observations/Objective: Alert, NAD, appropriate mood and affect, resps normal, cn 2-12 intact, moves all 4s, no visible rash or swelling Lab Results  Component Value Date   WBC 7.3 01/26/2018   HGB 13.6 01/26/2018   HCT 40.4 01/26/2018   PLT 219.0 01/26/2018   GLUCOSE 116 (H) 07/07/2018   CHOL 193 07/07/2018   TRIG 101.0 07/07/2018   HDL 44.70 07/07/2018   LDLDIRECT 139.0 01/02/2016   LDLCALC 128 (H) 07/07/2018   ALT 21 07/07/2018   AST 16 07/07/2018   NA 139 07/07/2018   K 3.6 07/07/2018   CL 101 07/07/2018   CREATININE 1.51 (H) 07/07/2018   BUN 21 07/07/2018   CO2 30 07/07/2018   TSH 1.90 01/26/2018   PSA 1.11 01/26/2018   HGBA1C 5.9 07/07/2018   Assessment and Plan: See notes  Follow Up Instructions: See notes   I discussed the assessment and treatment plan with the patient. The patient was provided an opportunity to ask questions and all were answered. The patient agreed with the plan and demonstrated an understanding of the instructions.   The patient was advised to call back or seek an in-person evaluation if the symptoms worsen or if the condition fails to improve as anticipated.   Oliver Barre,  MD

## 2019-01-11 NOTE — Assessment & Plan Note (Signed)
For add nasacort asd,  to f/u any worsening symptoms or concerns

## 2019-01-12 ENCOUNTER — Other Ambulatory Visit (INDEPENDENT_AMBULATORY_CARE_PROVIDER_SITE_OTHER): Payer: Medicare HMO

## 2019-01-12 DIAGNOSIS — E291 Testicular hypofunction: Secondary | ICD-10-CM | POA: Diagnosis not present

## 2019-01-12 DIAGNOSIS — Z0000 Encounter for general adult medical examination without abnormal findings: Secondary | ICD-10-CM | POA: Diagnosis not present

## 2019-01-12 DIAGNOSIS — E559 Vitamin D deficiency, unspecified: Secondary | ICD-10-CM

## 2019-01-12 DIAGNOSIS — E785 Hyperlipidemia, unspecified: Secondary | ICD-10-CM | POA: Diagnosis not present

## 2019-01-12 DIAGNOSIS — R739 Hyperglycemia, unspecified: Secondary | ICD-10-CM

## 2019-01-12 DIAGNOSIS — E538 Deficiency of other specified B group vitamins: Secondary | ICD-10-CM | POA: Diagnosis not present

## 2019-01-12 LAB — URINALYSIS, ROUTINE W REFLEX MICROSCOPIC
Bilirubin Urine: NEGATIVE
Hgb urine dipstick: NEGATIVE
Ketones, ur: NEGATIVE
Leukocytes,Ua: NEGATIVE
Nitrite: NEGATIVE
RBC / HPF: NONE SEEN (ref 0–?)
Specific Gravity, Urine: 1.015 (ref 1.000–1.030)
Total Protein, Urine: NEGATIVE
Urine Glucose: NEGATIVE
Urobilinogen, UA: 0.2 (ref 0.0–1.0)
pH: 6 (ref 5.0–8.0)

## 2019-01-12 LAB — CBC WITH DIFFERENTIAL/PLATELET
Basophils Absolute: 0.1 10*3/uL (ref 0.0–0.1)
Basophils Relative: 1.3 % (ref 0.0–3.0)
Eosinophils Absolute: 0.2 10*3/uL (ref 0.0–0.7)
Eosinophils Relative: 2.4 % (ref 0.0–5.0)
HCT: 45 % (ref 39.0–52.0)
Hemoglobin: 15.7 g/dL (ref 13.0–17.0)
Lymphocytes Relative: 31.7 % (ref 12.0–46.0)
Lymphs Abs: 2.3 10*3/uL (ref 0.7–4.0)
MCHC: 35 g/dL (ref 30.0–36.0)
MCV: 79.8 fl (ref 78.0–100.0)
Monocytes Absolute: 0.5 10*3/uL (ref 0.1–1.0)
Monocytes Relative: 6.7 % (ref 3.0–12.0)
Neutro Abs: 4.3 10*3/uL (ref 1.4–7.7)
Neutrophils Relative %: 57.9 % (ref 43.0–77.0)
Platelets: 177 10*3/uL (ref 150.0–400.0)
RBC: 5.64 Mil/uL (ref 4.22–5.81)
RDW: 13.3 % (ref 11.5–15.5)
WBC: 7.4 10*3/uL (ref 4.0–10.5)

## 2019-01-12 LAB — BASIC METABOLIC PANEL
BUN: 17 mg/dL (ref 6–23)
CO2: 33 mEq/L — ABNORMAL HIGH (ref 19–32)
Calcium: 9.2 mg/dL (ref 8.4–10.5)
Chloride: 100 mEq/L (ref 96–112)
Creatinine, Ser: 1.32 mg/dL (ref 0.40–1.50)
GFR: 56.99 mL/min — ABNORMAL LOW (ref >60.00)
Glucose, Bld: 90 mg/dL (ref 70–99)
Potassium: 4 mEq/L (ref 3.5–5.1)
Sodium: 140 mEq/L (ref 135–145)

## 2019-01-12 LAB — LIPID PANEL
Cholesterol: 235 mg/dL — ABNORMAL HIGH (ref 0–200)
HDL: 43.4 mg/dL (ref >39.00)
LDL Cholesterol: 162 mg/dL — ABNORMAL HIGH (ref 0–99)
NonHDL: 191.22
Total CHOL/HDL Ratio: 5
Triglycerides: 148 mg/dL (ref 0.0–149.0)
VLDL: 29.6 mg/dL (ref 0.0–40.0)

## 2019-01-12 LAB — PSA: PSA: 0.97 ng/mL (ref 0.10–4.00)

## 2019-01-12 LAB — HEPATIC FUNCTION PANEL
ALT: 15 U/L (ref 0–53)
AST: 14 U/L (ref 0–37)
Albumin: 4.2 g/dL (ref 3.5–5.2)
Alkaline Phosphatase: 57 U/L (ref 39–117)
Bilirubin, Direct: 0.1 mg/dL (ref 0.0–0.3)
Total Bilirubin: 0.8 mg/dL (ref 0.2–1.2)
Total Protein: 6.6 g/dL (ref 6.0–8.3)

## 2019-01-12 LAB — TESTOSTERONE: Testosterone: 261.61 ng/dL — ABNORMAL LOW (ref 300.00–890.00)

## 2019-01-12 LAB — TSH: TSH: 2.22 u[IU]/mL (ref 0.35–4.50)

## 2019-01-12 LAB — HEMOGLOBIN A1C: Hgb A1c MFr Bld: 5.8 % (ref 4.6–6.5)

## 2019-01-12 LAB — VITAMIN D 25 HYDROXY (VIT D DEFICIENCY, FRACTURES): VITD: 47.89 ng/mL (ref 30.00–100.00)

## 2019-01-12 LAB — VITAMIN B12: Vitamin B-12: 430 pg/mL (ref 211–911)

## 2019-01-14 ENCOUNTER — Telehealth: Payer: Self-pay | Admitting: Internal Medicine

## 2019-01-14 NOTE — Telephone Encounter (Signed)
Copied from CRM [id number]. Topic: General - Other >> Jan 14, 2019  4:38 PM Jay Schlichter wrote: Reason for NMM:[facility] called, saw lab results on mychart. Would like an explanation of labs and to see if pt needs to go back on meds for cholesterol .  Please call back  Thanks

## 2019-01-17 ENCOUNTER — Other Ambulatory Visit: Payer: Self-pay | Admitting: Internal Medicine

## 2019-01-17 MED ORDER — ROSUVASTATIN CALCIUM 20 MG PO TABS: 20.0000 mg | ORAL_TABLET | Freq: Every day | ORAL | 3 refills | Status: DC

## 2019-01-17 MED ORDER — PRAVASTATIN SODIUM 80 MG PO TABS: 80.0000 mg | ORAL_TABLET | Freq: Every day | ORAL | 3 refills | Status: DC

## 2019-01-17 NOTE — Telephone Encounter (Signed)
Sound good, ok for crestor 20 qd, and can f/u lipids at next visit; pt should also follow a lower chol diet

## 2019-01-17 NOTE — Telephone Encounter (Signed)
Patient's wife informed of below. She states Crestor and it caused abdominal pain. He has taken pravastatin with no problems. She is requesting that instead. Also, she wants to know what he should do for his low testosterone. Please advise.

## 2019-01-17 NOTE — Telephone Encounter (Signed)
Ok for change the crestor to pravachol - I will send  I would hold on trying to treat with testosterone for now due to the above, and since most men cannot tell the difference even with tx

## 2019-01-17 NOTE — Telephone Encounter (Signed)
Left detailed message informing pt of below.  

## 2019-02-03 ENCOUNTER — Telehealth: Payer: Self-pay | Admitting: Internal Medicine

## 2019-02-03 DIAGNOSIS — E291 Testicular hypofunction: Secondary | ICD-10-CM

## 2019-02-03 MED ORDER — TESTOSTERONE 50 MG/5GM (1%) TD GEL: 5.0000 g | Freq: Every day | TRANSDERMAL | 1 refills | Status: DC

## 2019-02-03 NOTE — Telephone Encounter (Signed)
Copied from CRM [id number]. Topic: General - Call Back - No Documentation >> Feb 03, 2019 10:59 AM Randol Kern wrote: Reason for CRM: Low testosterone levels reported. Seeking medication, please advise.

## 2019-02-03 NOTE — Telephone Encounter (Signed)
Done erx 

## 2019-02-05 ENCOUNTER — Encounter (HOSPITAL_COMMUNITY): Payer: Self-pay | Admitting: *Deleted

## 2019-02-05 ENCOUNTER — Other Ambulatory Visit: Payer: Self-pay | Admitting: Internal Medicine

## 2019-02-05 ENCOUNTER — Emergency Department (HOSPITAL_COMMUNITY): Payer: Medicare HMO

## 2019-02-05 ENCOUNTER — Emergency Department (HOSPITAL_COMMUNITY)
Admission: EM | Admit: 2019-02-05 | Discharge: 2019-02-05 | Disposition: A | Discharge status: Home / Self Care | Payer: Medicare HMO | Attending: Emergency Medicine | Admitting: Emergency Medicine

## 2019-02-05 ENCOUNTER — Other Ambulatory Visit: Payer: Self-pay

## 2019-02-05 DIAGNOSIS — R110 Nausea: Secondary | ICD-10-CM | POA: Diagnosis not present

## 2019-02-05 DIAGNOSIS — K5730 Diverticulosis of large intestine without perforation or abscess without bleeding: Secondary | ICD-10-CM | POA: Diagnosis not present

## 2019-02-05 DIAGNOSIS — Z79899 Other long term (current) drug therapy: Secondary | ICD-10-CM | POA: Diagnosis not present

## 2019-02-05 DIAGNOSIS — N3289 Other specified disorders of bladder: Secondary | ICD-10-CM | POA: Diagnosis not present

## 2019-02-05 DIAGNOSIS — I129 Hypertensive chronic kidney disease with stage 1 through stage 4 chronic kidney disease, or unspecified chronic kidney disease: Secondary | ICD-10-CM | POA: Diagnosis not present

## 2019-02-05 DIAGNOSIS — R1013 Epigastric pain: Secondary | ICD-10-CM | POA: Diagnosis not present

## 2019-02-05 DIAGNOSIS — R1011 Right upper quadrant pain: Secondary | ICD-10-CM

## 2019-02-05 DIAGNOSIS — K760 Fatty (change of) liver, not elsewhere classified: Secondary | ICD-10-CM | POA: Diagnosis not present

## 2019-02-05 DIAGNOSIS — N183 Chronic kidney disease, stage 3 (moderate): Secondary | ICD-10-CM | POA: Diagnosis not present

## 2019-02-05 LAB — URINALYSIS, ROUTINE W REFLEX MICROSCOPIC
Bacteria, UA: NONE SEEN
Bilirubin Urine: NEGATIVE
Glucose, UA: NEGATIVE mg/dL
Hgb urine dipstick: NEGATIVE
Ketones, ur: NEGATIVE mg/dL
Nitrite: NEGATIVE
Protein, ur: NEGATIVE mg/dL
Specific Gravity, Urine: 1.015 (ref 1.005–1.030)
pH: 6 (ref 5.0–8.0)

## 2019-02-05 LAB — COMPREHENSIVE METABOLIC PANEL
ALT: 35 U/L (ref 0–44)
AST: 23 U/L (ref 15–41)
Albumin: 4.4 g/dL (ref 3.5–5.0)
Alkaline Phosphatase: 61 U/L (ref 38–126)
Anion gap: 12 (ref 5–15)
BUN: 21 mg/dL — ABNORMAL HIGH (ref 6–20)
CO2: 24 mmol/L (ref 22–32)
Calcium: 9.2 mg/dL (ref 8.9–10.3)
Chloride: 103 mmol/L (ref 98–111)
Creatinine, Ser: 1.2 mg/dL (ref 0.61–1.24)
GFR calc Af Amer: >60 mL/min (ref >60)
GFR calc non Af Amer: >60 mL/min (ref >60)
Glucose, Bld: 117 mg/dL — ABNORMAL HIGH (ref 70–99)
Potassium: 3.2 mmol/L — ABNORMAL LOW (ref 3.5–5.1)
Sodium: 139 mmol/L (ref 135–145)
Total Bilirubin: 0.6 mg/dL (ref 0.3–1.2)
Total Protein: 7.7 g/dL (ref 6.5–8.1)

## 2019-02-05 LAB — LIPASE, BLOOD: Lipase: 34 U/L (ref 11–51)

## 2019-02-05 LAB — CBC
HCT: 46.7 % (ref 39.0–52.0)
Hemoglobin: 15.9 g/dL (ref 13.0–17.0)
MCH: 27.2 pg (ref 26.0–34.0)
MCHC: 34 g/dL (ref 30.0–36.0)
MCV: 80 fL (ref 80.0–100.0)
Platelets: 185 10*3/uL (ref 150–400)
RBC: 5.84 MIL/uL — ABNORMAL HIGH (ref 4.22–5.81)
RDW: 13.2 % (ref 11.5–15.5)
WBC: 8.3 10*3/uL (ref 4.0–10.5)
nRBC: 0 % (ref 0.0–0.2)

## 2019-02-05 MED ORDER — SODIUM CHLORIDE 0.9% FLUSH: 3.0000 mL | Freq: Once | INTRAVENOUS | Status: DC

## 2019-02-05 MED ORDER — IOHEXOL 300 MG/ML  SOLN
100.0000 mL | Freq: Once | INTRAMUSCULAR | Status: AC | PRN
  Administered 2019-02-05: 100 mL via INTRAVENOUS

## 2019-02-05 MED ORDER — POTASSIUM CHLORIDE CRYS ER 20 MEQ PO TBCR
40.0000 meq | EXTENDED_RELEASE_TABLET | Freq: Once | ORAL | Status: AC
  Administered 2019-02-05: 40 meq via ORAL
  Filled 2019-02-05: qty 2

## 2019-02-05 MED ORDER — OMEPRAZOLE 20 MG PO CPDR: 20.0000 mg | DELAYED_RELEASE_CAPSULE | Freq: Every day | ORAL | 0 refills | Status: DC

## 2019-02-05 MED ORDER — PANTOPRAZOLE SODIUM 20 MG PO TBEC
20.0000 mg | DELAYED_RELEASE_TABLET | Freq: Once | ORAL | Status: AC
  Administered 2019-02-05: 20 mg via ORAL
  Filled 2019-02-05: qty 1

## 2019-02-05 MED ORDER — SODIUM CHLORIDE (PF) 0.9 % IJ SOLN
INTRAMUSCULAR | Status: AC
  Filled 2019-02-05: qty 50

## 2019-02-05 NOTE — Discharge Instructions (Signed)
You were seen in the ED today for abdominal pain; your ultrasound did not show any findings within your gallbladder; your CAT scan of your abdomen as also reassuring as well as your bloodwork; you may be experiencing pain due to acid reflux; please take Prilosec as prescribed for your symptoms and follow up with your PCP. I have attached your ultrasound and CT scan results.   Your CT also showed an incidental finding of a mass on your right adrenal gland; it is recommended that you have a repeat CT scan in 12 months.

## 2019-02-05 NOTE — ED Triage Notes (Signed)
Pt reports abd bloating and pain in rt upper quad, after eating fried hamburger later pain started. Nausea, Mother told him it is gallbladder problems. Called his MD who told him to come in.

## 2019-02-05 NOTE — ED Provider Notes (Signed)
Care assumed from [facility], [place], at shift change, please see their notes for full documentation of patient's complaint/HPI. Briefly, pt here with RUQ abdominal pain after eating fried hamburger last night. Results so far show mild hypokalemia at 3.2 otherwise no abnormalities. Awaiting RUQ ultrasound. Plan is to discharge home if only having cholelithiasis with diet instructions and outpatient surgical follow up. If showing cholecystitis will consult surgery for admission.    Physical Exam  BP (!) 173/116   Pulse 85   Temp 98.8 F (37.1 C) (Oral)   Resp 18   Ht 5\' 7"  (1.702 m)   Wt 129.3 kg   SpO2 97%   BMI 44.64 kg/m   Physical Exam Vitals signs and nursing note reviewed.  Constitutional:      Appearance: He is not ill-appearing.  HENT:     Head: Normocephalic and atraumatic.  Eyes:     Conjunctiva/sclera: Conjunctivae normal.  Cardiovascular:     Rate and Rhythm: Normal rate and regular rhythm.  Pulmonary:     Effort: Pulmonary effort is normal.     Breath sounds: Normal breath sounds.  Abdominal:     Tenderness: There is abdominal tenderness in the right upper quadrant and epigastric area. There is no guarding or rebound. Negative signs include Murphy's sign.  Skin:    General: Skin is warm and dry.     Coloration: Skin is not jaundiced.  Neurological:     Mental Status: He is alert.     ED Course/Procedures    Ultrasound with findings of hepatic steatosis; no findings within the gallbladder; LFTs unremarkable. Upon reevaluation of patient he reports his pain is more epigastric in nature; this pain has been intermittent for the past month; he is concerned that there is no obvious reason for his pain today. Will proceed with CT A/P although do not suspect acute abdomen at this time; pt has had appendectomy in the past. Pain medication as well as antiemetics offered to patient although he is declining at this time; he states the pain is not as bad as yesterday and he does  not want anything currently.    CT scan without acute reason for pain; does show an incidentaloma on right adrenal gland; pt reports he was told he had this about 10 years ago. Repeat CT scan recommended in 12 months time. Patient likely experiencing pain from GERD vs PUD at this point in time; will prescribe Prilosec for him today as well as outpatient PCP follow up. Patient is in agreement with plan at this time and stable for discharge home.    MDM Number of Diagnoses or Management Options Epigastric pain:  Nausea:  RUQ pain:        Eustaquio Maize, PA-C 02/05/19 1801    Maudie Flakes, MD 02/09/19 [phone]

## 2019-02-05 NOTE — ED Provider Notes (Signed)
[facility]-EMERGENCY DEPT Provider Note   CSN: [id number] Arrival date & time: 02/05/19  1043    History   Chief Complaint Chief Complaint  Patient presents with  . Abdominal Pain    HPI Darren Black is a 53 y.o. male.     Darren Black is a 53 y.o. male who presents for evaluation of right upper quadrant abdominal pain.  Patient reports pain started last night about an hour after eating a fried hamburger.  He reports associated nausea but no vomiting.  He has had decreased appetite had and has not eaten anything since dinner last night.  He reports that he called his mom who told him to drink water with lemon juice which seemed to improve pain enough for him to be able to go to sleep when he woke up this morning pain was persistent and worsening.  He has not had any diarrhea, constipation, melena or hematochezia.  He denies dysuria, urinary frequency, flank pain or hematuria.  Denies chest pain, shortness of breath or cough.  He has had previous appendectomy but denies any other abdominal surgeries.  No other aggravating or alleviating factors.  No prior history of similar symptoms.     Past Medical History:  Diagnosis Date  . Anxiety   . BPH (benign prostatic hypertrophy)   . Chronic lower back pain    nerve damage w/ leg pain  . Depression   . Family history of adverse reaction to anesthesia    MOTHER--- HARD TO WAKE  . History of panic attacks   . Hyperlipidemia   . Hypertension   . Meatal stenosis   . OSA on CPAP    moderate to severe OSA  per study 04-28-2007 uses CPAP  . PONV (postoperative nausea and vomiting)     Patient Active Problem List   Diagnosis Date Noted  . Hyperglycemia 07/28/2017  . Increased prostate specific antigen (PSA) velocity 07/08/2016  . Urinary retention 07/04/2015  . CKD (chronic kidney disease) stage 3, GFR 30-59 ml/min (HCC) 01/03/2014  . Chronic lower back pain 10/04/2013  . Right lumbar radiculopathy 01/31/2013   . Right shoulder pain 01/07/2012  . Knee abrasion 11/08/2011  . Morbid obesity (HCC) 08/28/2011  . Preventative health care 04/22/2011  . Hyperlipidemia 10/09/2009  . FATIGUE 07/13/2009  . HYPOPITUITARISM 05/15/2009  . HYPOGONADISM 04/20/2009  . LIBIDO, DECREASED 04/11/2009  . PERIPHERAL EDEMA 02/08/2009  . SKIN LESION 04/12/2008  . HSV 01/11/2008  . ORAL ULCER 01/11/2008  . Allergic rhinitis 07/13/2007  . Obstructive sleep apnea 05/29/2007  . Anxiety state 05/28/2007  . DEPRESSION 05/28/2007  . Essential hypertension 05/28/2007  . URINARY TRACT INFECTION, HX OF 05/28/2007    Past Surgical History:  Procedure Laterality Date  . APPENDECTOMY  2003  . BREATH TEK H PYLORI  04/27/2012   Procedure: BREATH TEK H PYLORI;  Surgeon: Valarie MerinoMatthew B Martin, MD;  [facility];  Service: General;  Laterality: N/A;  . CYSTOSCOPY WITH URETHRAL DILATATION N/A 04/12/2015   Procedure: CYSTOSCOPY WITH URETHRAL DILATATION, RETROGRADE AND URETHROGRAM WITH BLADDER BIOPSY;  Surgeon: Bjorn PippinJohn Wrenn, MD;  [facility] [place];  Service: Urology;  Laterality: N/A;  . ROTATOR CUFF REPAIR Right 01-21-2012  . TRANSTHORACIC ECHOCARDIOGRAM  03-26-2007   normal LVSF, ef 60%,  mild AV calcification without stenosis,  mild MR and TR,  mild LAE  . URETHROGRAM N/A 04/12/2015   Procedure: URETHROGRAM;  Surgeon: Bjorn PippinJohn Wrenn, MD;  [facility] [place];  Service: Urology;  Laterality: N/A;        Home Medications    Prior to Admission medications   Medication Sig Start Date End Date Taking? Authorizing Provider  amLODipine (NORVASC) 5 MG tablet Take 1 tablet by mouth once daily 11/02/18   Corwin LevinsJohn, James W, MD  benazepril (LOTENSIN) 40 MG tablet Take 1 tablet (40 mg total) by mouth daily. 07/12/18   Corwin LevinsJohn, James W, MD  doxazosin (CARDURA) 4 MG tablet Take 1 tablet (4 mg total) by mouth at bedtime. 06/17/18   Corwin LevinsJohn, James W, MD  DULoxetine (CYMBALTA) 60 MG capsule Take 1 capsule (60 mg  total) by mouth every morning. 12/28/17 12/28/18  Corwin LevinsJohn, James W, MD  fluticasone (FLONASE) 50 MCG/ACT nasal spray Place 2 sprays into both nostrils daily. 01/26/18   Corwin LevinsJohn, James W, MD  hydrochlorothiazide (HYDRODIURIL) 25 MG tablet TAKE 1/2 (ONE-HALF) TABLET BY MOUTH ONCE DAILY 10/11/18   Corwin LevinsJohn, James W, MD  LORazepam (ATIVAN) 1 MG tablet Take 1 tablet (1 mg total) by mouth 2 (two) times daily as needed for anxiety. 12/02/18   Corwin LevinsJohn, James W, MD  Lycopene 10 MG CAPS Take 1 capsule by mouth every evening.    [provider]  Omega 3-6-9 Fatty Acids (OMEGA 3-6-9 COMPLEX PO) Take 1 capsule by mouth daily.     [provider]  oxyCODONE-acetaminophen (PERCOCET/ROXICET) 5-325 MG tablet Take 1 tablet by mouth daily as needed for severe pain. 01/11/19   Corwin LevinsJohn, James W, MD  potassium chloride SA (K-DUR,KLOR-CON) 20 MEQ tablet TAKE 1/2 (ONE-HALF) TABLET BY MOUTH ONCE DAILY 02/08/18   Corwin LevinsJohn, James W, MD  pravastatin (PRAVACHOL) 80 MG tablet Take 1 tablet (80 mg total) by mouth daily. 01/17/19   Corwin LevinsJohn, James W, MD  rosuvastatin (CRESTOR) 20 MG tablet Take 1 tablet (20 mg total) by mouth daily. 01/17/19   Corwin LevinsJohn, James W, MD  tamsulosin (FLOMAX) 0.4 MG CAPS capsule Take 1 capsule (0.4 mg total) by mouth daily. 12/08/18   Corwin LevinsJohn, James W, MD  testosterone (ANDROGEL) 50 MG/5GM (1%) GEL Place 5 g onto the skin daily. 02/03/19   Corwin LevinsJohn, James W, MD  triamcinolone (NASACORT) 55 MCG/ACT AERO nasal inhaler Place 2 sprays into the nose daily. 01/11/19   Corwin LevinsJohn, James W, MD  potassium chloride (KLOR-CON 10) 10 MEQ CR tablet Take 1 tablet (10 mEq total) by mouth daily. 08/28/11 08/23/12  Corwin LevinsJohn, James W, MD    Family History Family History  Problem Relation Age of Onset  . Coronary artery disease Father   . Cancer Father        anaplastic thyroid cancer  . Stroke Other        1st degree relative  . Diabetes Other        1st degree relative  . Hypertension Other   . Hyperlipidemia Other     Social History Social History    Tobacco Use  . Smoking status: Never Smoker  . Smokeless tobacco: Never Used  Substance Use Topics  . Alcohol use: Yes    Comment: rare  . Drug use: No     Allergies   Crestor [rosuvastatin calcium] and Flomax [tamsulosin hcl]   Review of Systems Review of Systems  Constitutional: Negative for chills and fever.  Respiratory: Negative for cough and shortness of breath.   Cardiovascular: Negative for chest pain.  Gastrointestinal: Positive for abdominal pain and nausea. Negative for constipation, diarrhea and vomiting.  Genitourinary: Negative for dysuria, flank pain, frequency and hematuria.  Musculoskeletal: Negative for myalgias.  Skin: Negative for color change and rash.  Neurological: Negative for dizziness and light-headedness.  All other systems reviewed and are negative.    Physical Exam Updated Vital Signs BP (!) 162/100 (BP Location: Left Arm)   Pulse 75   Temp 98.3 F (36.8 C) (Oral)   Resp 20   Ht 5\' 7"  (1.702 m)   Wt 129.3 kg   SpO2 99%   BMI 44.64 kg/m   Physical Exam Vitals signs and nursing note reviewed.  Constitutional:      General: He is not in acute distress.    Appearance: He is well-developed and normal weight. He is not ill-appearing or diaphoretic.  HENT:     Head: Normocephalic and atraumatic.     Mouth/Throat:     Mouth: Mucous membranes are moist.     Pharynx: Oropharynx is clear.  Eyes:     General:        Right eye: No discharge.        Left eye: No discharge.     Pupils: Pupils are equal, round, and reactive to light.  Neck:     Musculoskeletal: Neck supple.  Cardiovascular:     Rate and Rhythm: Normal rate and regular rhythm.     Heart sounds: Normal heart sounds.  Pulmonary:     Effort: Pulmonary effort is normal. No respiratory distress.     Breath sounds: Normal breath sounds. No wheezing or rales.     Comments: Respirations equal and unlabored, patient able to speak in full sentences, lungs clear to auscultation  bilaterally Abdominal:     General: Abdomen is flat. Bowel sounds are normal. There is no distension.     Palpations: Abdomen is soft. There is no mass.     Tenderness: There is abdominal tenderness in the right upper quadrant. There is no guarding. Positive signs include Murphy's sign.     Comments: Abdomen is soft, nondistended, bowel sounds present throughout, there is focal tenderness in the right upper quadrant with positive Murphy sign, all other quadrants nontender to palpation without guarding or rebound tenderness.  No CVA tenderness bilaterally.  Musculoskeletal:        General: No deformity.  Skin:    General: Skin is warm and dry.     Capillary Refill: Capillary refill takes less than 2 seconds.  Neurological:     Mental Status: He is alert and oriented to person, place, and time.     Coordination: Coordination normal.     Comments: Speech is clear, able to follow commands Moves extremities without ataxia, coordination intact  Psychiatric:        Mood and Affect: Mood normal.        Behavior: Behavior normal.      ED Treatments / Results  Labs (all labs ordered are listed, but only abnormal results are displayed) Labs Reviewed  COMPREHENSIVE METABOLIC PANEL - Abnormal; Notable for the following components:      Result Value   Potassium 3.2 (*)    Glucose, Bld 117 (*)    BUN 21 (*)    All other components within normal limits  CBC - Abnormal; Notable for the following components:   RBC 5.84 (*)    All other components within normal limits  URINALYSIS, ROUTINE W REFLEX MICROSCOPIC - Abnormal; Notable for the following components:   Leukocytes,Ua TRACE (*)    All other components within normal limits  LIPASE, BLOOD    EKG None  Radiology No results found.  Procedures  Procedures (including critical care time)  Medications Ordered in ED Medications  sodium chloride flush (NS) 0.9 % injection 3 mL (has no administration in time range)  sodium chloride (PF)  0.9 % injection (has no administration in time range)  potassium chloride SA (K-DUR) CR tablet 40 mEq (40 mEq Oral Given 02/05/19 1336)  iohexol (OMNIPAQUE) 300 MG/ML solution 100 mL (100 mLs Intravenous Contrast Given 02/05/19 1623)     Initial Impression / Assessment and Plan / ED Course  I have reviewed the triage vital signs and the nursing notes.  Pertinent labs & imaging results that were available during my care of the patient were reviewed by me and considered in my medical decision making (see chart for details).  Patient presents with right upper quadrant abdominal pain which started last night after eating fried food.  He reports associated nausea but no vomiting, decreased appetite.  No diarrhea or constipation.  He has had chills but no fever.  Denies prior history of similar pain.  On arrival patient is mildly hypertensive but all other vitals normal.  He is focally tender in the right upper quadrant with positive Murphy sign.  Will check abdominal labs and right upper quadrant ultrasound.  Labs show no leukocytosis, normal hemoglobin, potassium of 3.2 but no other concerning electrolyte derangements, normal renal and liver function normal lipase.  Urinalysis without signs of infection and no hematuria to suggest right-sided kidney stone.  Right upper quadrant ultrasound pending.  Offered patient pain medication but he would like to hold off at this time as pain has improved.  At shift change care signed out to [place] who will follow-up on right upper quadrant ultrasound, suspect this will show gallstones without signs of cholecystitis if it does surgery will need to be consulted if no obvious cause for patient's pain may consider CT abdomen pelvis if patient is still experiencing discomfort.  Final Clinical Impressions(s) / ED Diagnoses   Final diagnoses:  RUQ pain    ED Discharge Orders    None       Jacqlyn Larsen, [place] 02/05/19 1630    Maudie Flakes, MD  02/09/19 [phone]

## 2019-02-05 NOTE — ED Notes (Signed)
Bed: FV43 Expected date: 02/05/19 Expected time: 4:39 AM Means of arrival:  Comments:

## 2019-02-07 ENCOUNTER — Ambulatory Visit: Payer: Self-pay | Admitting: *Deleted

## 2019-02-07 DIAGNOSIS — N289 Disorder of kidney and ureter, unspecified: Secondary | ICD-10-CM | POA: Diagnosis not present

## 2019-02-07 DIAGNOSIS — K219 Gastro-esophageal reflux disease without esophagitis: Secondary | ICD-10-CM | POA: Diagnosis not present

## 2019-02-07 DIAGNOSIS — R1013 Epigastric pain: Secondary | ICD-10-CM | POA: Diagnosis not present

## 2019-02-07 DIAGNOSIS — E6609 Other obesity due to excess calories: Secondary | ICD-10-CM | POA: Diagnosis not present

## 2019-02-07 NOTE — Telephone Encounter (Signed)
Patient was seen in ED on Saturday with upper abdominal pain that that does not radiate. Reports this discomfort has been gradual over the last month. He did have an increase in ativan about that time. Reports scan and ultrasound were negative he was given prilosec and made a GI consult for 6/18. Has mild watery diarrhea for 3-4 days, looks oily. Occasional mild nausea no vomiting. Stomach tender to touch at times.Reports this pain started after eating fried burgers and potatoes on Friday.  No fever/no blood/mucus in stool. No difficulty voiding. Advised liquid diet today and advance tomorrow. Ultrasound showed findings of hepatic steatosis.  If pain persist over one hour, call back. Routing to PCP for appointment as requested by patient.  Reason for Disposition . [1] MILD pain (e.g., does not interfere with normal activities) AND [2] pain comes and goes (cramps) [3] present > 48 hours  Answer Assessment - Initial Assessment Questions 1. LOCATION: "Where does it hurt?"      Right sided abdominal pain 2. RADIATION: "Does the pain shoot anywhere else?" (e.g., chest, back)    3-4 days.  3. ONSET: "When did the pain begin?" (Minutes, hours or days ago)     3-4 days. 4. SUDDEN: "Gradual or sudden onset?"     Gradually come on. 5. PATTERN "Does the pain come and go, or is it constant?"    - If constant: "Is it getting better, staying the same, or worsening?"      (Note: Constant means the pain never goes away completely; most serious pain is constant and it progresses)     - If intermittent: "How long does it last?" "Do you have pain now?"     (Note: Intermittent means the pain goes away completely between bouts)     intermittent  6. SEVERITY: "How bad is the pain?"  (e.g., Scale 1-10; mild, moderate, or severe)    - MILD (1-3): doesn't interfere with normal activities, abdomen soft and not tender to touch     - MODERATE (4-7): interferes with normal activities or awakens from sleep, tender to touch      - SEVERE (8-10): excruciating pain, doubled over, unable to do any normal activities      moderate 7. RECURRENT SYMPTOM: "Have you ever had this type of abdominal pain before?" If so, ask: "When was the last time?" and "What happened that time?"      no 8. CAUSE: "What do you think is causing the abdominal pain?"     Possible ulcer 9. RELIEVING/AGGRAVATING FACTORS: "What makes it better or worse?" (e.g., movement, antacids, bowel movement)    prilosec 10. OTHER SYMPTOMS: "Has there been any vomiting, diarrhea, constipation, or urine problems?"       Watery and oily diarrhea  Protocols used: ABDOMINAL PAIN - MALE-A-AH

## 2019-02-07 NOTE — Telephone Encounter (Signed)
Appointment scheduled.

## 2019-02-08 ENCOUNTER — Ambulatory Visit (INDEPENDENT_AMBULATORY_CARE_PROVIDER_SITE_OTHER): Payer: Medicare HMO | Admitting: Internal Medicine

## 2019-02-08 ENCOUNTER — Other Ambulatory Visit: Payer: Self-pay

## 2019-02-08 ENCOUNTER — Ambulatory Visit: Payer: Medicare HMO | Admitting: Pulmonary Disease

## 2019-02-08 ENCOUNTER — Encounter: Payer: Self-pay | Admitting: Internal Medicine

## 2019-02-08 DIAGNOSIS — R1013 Epigastric pain: Secondary | ICD-10-CM | POA: Diagnosis not present

## 2019-02-08 DIAGNOSIS — I10 Essential (primary) hypertension: Secondary | ICD-10-CM | POA: Diagnosis not present

## 2019-02-08 DIAGNOSIS — R69 Illness, unspecified: Secondary | ICD-10-CM | POA: Diagnosis not present

## 2019-02-08 DIAGNOSIS — F411 Generalized anxiety disorder: Secondary | ICD-10-CM | POA: Diagnosis not present

## 2019-02-08 MED ORDER — PANTOPRAZOLE SODIUM 40 MG PO TBEC: 40.0000 mg | DELAYED_RELEASE_TABLET | Freq: Two times a day (BID) | ORAL | 1 refills | Status: DC

## 2019-02-08 MED ORDER — SUCRALFATE 1 G PO TABS: 1.0000 g | ORAL_TABLET | Freq: Three times a day (TID) | ORAL | 0 refills | Status: DC

## 2019-02-08 NOTE — Patient Instructions (Signed)
Ok to change the prilosec to protonix at 40 mg twice per day  Please take all new medication as prescribed - the carafate  OK to take the percocet as needed, as this will not make the stomach worse  Please continue all other medications as before, and refills have been done if requested.  Please have the pharmacy call with any other refills you may need.  Please continue your efforts at being more active, low cholesterol diet, and weight control.  Please keep your appointments with your specialists as you may have planned - the EDG for Thursday

## 2019-02-08 NOTE — Progress Notes (Signed)
Subjective:    Patient ID: Darren Black, male    DOB: Aug 24, 1966, 53 y.o.   MRN: [id number]  HPI  Here to f/u recent ED visit June 6 with RUQ pain - RUQ abdominal pain after eating fried hamburger last night. Results so far show mild hypokalemia at 3.2 otherwise no abnormalities. Awaiting RUQ ultrasound. Plan is to discharge home if only having cholelithiasis with diet instructions and outpatient surgical follow up. If showing cholecystitis will consult surgery for admission.  Saw GI yesterday, with prilosec 20 mg started and EGD sheduled very soon for jun 11. Now unfort more pain last night, somewhat better with camamil tea, honey and applesauce, wondering if he is going to die, very nervous almost panic Has not taken the percocet prescribed yet. Past Medical History:  Diagnosis Date  . Anxiety   . BPH (benign prostatic hypertrophy)   . Chronic lower back pain    nerve damage w/ leg pain  . Depression   . Family history of adverse reaction to anesthesia    MOTHER--- HARD TO WAKE  . History of panic attacks   . Hyperlipidemia   . Hypertension   . Meatal stenosis   . OSA on CPAP    moderate to severe OSA  per study 04-28-2007 uses CPAP  . PONV (postoperative nausea and vomiting)    Past Surgical History:  Procedure Laterality Date  . APPENDECTOMY  2003  . BREATH TEK H PYLORI  04/27/2012   Procedure: BREATH TEK H PYLORI;  Surgeon: Valarie MerinoMatthew B Martin, MD;  [facility];  Service: General;  Laterality: N/A;  . CYSTOSCOPY WITH URETHRAL DILATATION N/A 04/12/2015   Procedure: CYSTOSCOPY WITH URETHRAL DILATATION, RETROGRADE AND URETHROGRAM WITH BLADDER BIOPSY;  Surgeon: Bjorn PippinJohn Wrenn, MD;  [facility] [place];  Service: Urology;  Laterality: N/A;  . ROTATOR CUFF REPAIR Right 01-21-2012  . TRANSTHORACIC ECHOCARDIOGRAM  03-26-2007   normal LVSF, ef 60%,  mild AV calcification without stenosis,  mild MR and TR,  mild LAE  . URETHROGRAM N/A 04/12/2015   Procedure:  URETHROGRAM;  Surgeon: Bjorn PippinJohn Wrenn, MD;  [facility] [place];  Service: Urology;  Laterality: N/A;    reports that he has never smoked. He has never used smokeless tobacco. He reports current alcohol use. He reports that he does not use drugs. family history includes Cancer in his father; Coronary artery disease in his father; Diabetes in an other family member; Hyperlipidemia in an other family member; Hypertension in an other family member; Stroke in an other family member. Allergies  Allergen Reactions  . Crestor [Rosuvastatin Calcium] Other (See Comments)    abd pain  . Flomax [Tamsulosin Hcl] Rash   Current Outpatient Medications on File Prior to Visit  Medication Sig Dispense Refill  . amLODipine (NORVASC) 5 MG tablet Take 1 tablet by mouth once daily 90 tablet 0  . benazepril (LOTENSIN) 40 MG tablet Take 1 tablet (40 mg total) by mouth daily. 90 tablet 3  . doxazosin (CARDURA) 4 MG tablet Take 1 tablet (4 mg total) by mouth at bedtime. 90 tablet 3  . fluticasone (FLONASE) 50 MCG/ACT nasal spray Place 2 sprays into both nostrils daily. 16 g 6  . hydrochlorothiazide (HYDRODIURIL) 25 MG tablet TAKE 1/2 (ONE-HALF) TABLET BY MOUTH ONCE DAILY 45 tablet 2  . LORazepam (ATIVAN) 1 MG tablet Take 1 tablet (1 mg total) by mouth 2 (two) times daily as needed for anxiety. 60 tablet 2  . Lycopene 10 MG CAPS  Take 1 capsule by mouth every evening.    . Omega 3-6-9 Fatty Acids (OMEGA 3-6-9 COMPLEX PO) Take 1 capsule by mouth daily.     Marland Kitchen omeprazole (PRILOSEC) 20 MG capsule Take 1 capsule (20 mg total) by mouth daily. 30 capsule 0  . oxyCODONE-acetaminophen (PERCOCET/ROXICET) 5-325 MG tablet Take 1 tablet by mouth daily as needed for severe pain. 30 tablet 0  . potassium chloride SA (K-DUR) 20 MEQ tablet Take 1/2 (one-half) tablet by mouth once daily 45 tablet 1  . pravastatin (PRAVACHOL) 80 MG tablet Take 1 tablet (80 mg total) by mouth daily. 90 tablet 3  . rosuvastatin (CRESTOR) 20 MG  tablet Take 1 tablet (20 mg total) by mouth daily. 90 tablet 3  . tamsulosin (FLOMAX) 0.4 MG CAPS capsule Take 1 capsule (0.4 mg total) by mouth daily. 90 capsule 3  . testosterone (ANDROGEL) 50 MG/5GM (1%) GEL Place 5 g onto the skin daily. 450 g 1  . triamcinolone (NASACORT) 55 MCG/ACT AERO nasal inhaler Place 2 sprays into the nose daily. 1 Inhaler 12  . DULoxetine (CYMBALTA) 60 MG capsule Take 1 capsule (60 mg total) by mouth every morning. 90 capsule 3  . [DISCONTINUED] potassium chloride (KLOR-CON 10) 10 MEQ CR tablet Take 1 tablet (10 mEq total) by mouth daily. 90 tablet 3   No current facility-administered medications on file prior to visit.    Review of Systems  Constitutional: Negative for other unusual diaphoresis or sweats HENT: Negative for ear discharge or swelling Eyes: Negative for other worsening visual disturbances Respiratory: Negative for stridor or other swelling  Gastrointestinal: Negative for worsening distension or other blood Genitourinary: Negative for retention or other urinary change Musculoskeletal: Negative for other MSK pain or swelling Skin: Negative for color change or other new lesions Neurological: Negative for worsening tremors and other numbness  Psychiatric/Behavioral: Negative for worsening agitation or other fatigue All other system neg per pt    Objective:   Physical Exam BP 126/84   Pulse 76   Temp 97.6 F (36.4 C) (Oral)   Ht 5\' 7"  (1.702 m)   Wt 279 lb (126.6 kg)   SpO2 97%   BMI 43.70 kg/m  VS noted,  Constitutional: Pt appears in NAD HENT: Head: NCAT.  Right Ear: External ear normal.  Left Ear: External ear normal.  Eyes: . Pupils are equal, round, and reactive to light. Conjunctivae and EOM are normal Nose: without d/c or deformity Neck: Neck supple. Gross normal ROM Cardiovascular: Normal rate and regular rhythm.   Pulmonary/Chest: Effort normal and breath sounds without rales or wheezing.  Abd:  Soft,  ND, + BS, no  organomegaly, with mod epigastric tender Neurological: Pt is alert. At baseline orientation, motor grossly intact Skin: Skin is warm. No rashes, other new lesions, no LE edema Psychiatric: Pt behavior is normal without agitation , 2+ nervous No other exam findings Lab Results  Component Value Date   WBC 8.3 02/05/2019   HGB 15.9 02/05/2019   HCT 46.7 02/05/2019   PLT 185 02/05/2019   GLUCOSE 117 (H) 02/05/2019   CHOL 235 (H) 01/12/2019   TRIG 148.0 01/12/2019   HDL 43.40 01/12/2019   LDLDIRECT 139.0 01/02/2016   LDLCALC 162 (H) 01/12/2019   ALT 35 02/05/2019   AST 23 02/05/2019   NA 139 02/05/2019   K 3.2 (L) 02/05/2019   CL 103 02/05/2019   CREATININE 1.20 02/05/2019   BUN 21 (H) 02/05/2019   CO2 24 02/05/2019   TSH  2.22 01/12/2019   PSA 0.97 01/12/2019   HGBA1C 5.8 01/12/2019      Assessment & Plan:

## 2019-02-10 ENCOUNTER — Encounter: Payer: Self-pay | Admitting: Internal Medicine

## 2019-02-10 DIAGNOSIS — K219 Gastro-esophageal reflux disease without esophagitis: Secondary | ICD-10-CM | POA: Diagnosis not present

## 2019-02-10 DIAGNOSIS — K3189 Other diseases of stomach and duodenum: Secondary | ICD-10-CM | POA: Diagnosis not present

## 2019-02-10 DIAGNOSIS — R1013 Epigastric pain: Secondary | ICD-10-CM | POA: Diagnosis not present

## 2019-02-10 DIAGNOSIS — K319 Disease of stomach and duodenum, unspecified: Secondary | ICD-10-CM | POA: Diagnosis not present

## 2019-02-10 DIAGNOSIS — G4733 Obstructive sleep apnea (adult) (pediatric): Secondary | ICD-10-CM | POA: Diagnosis not present

## 2019-02-10 NOTE — Assessment & Plan Note (Signed)
Mod to severe pain, ok to take the percocet prn, for chane prilosec to protonix , add carafate asd, for EGD as planned,  to f/u any worsening symptoms or concerns

## 2019-02-10 NOTE — Assessment & Plan Note (Signed)
D/w pt, reassured, cont same tx

## 2019-02-10 NOTE — Assessment & Plan Note (Signed)
stable overall by history and exam, recent data reviewed with pt, and pt to continue medical treatment as before,  to f/u any worsening symptoms or concerns  

## 2019-02-17 ENCOUNTER — Telehealth: Payer: Self-pay | Admitting: Internal Medicine

## 2019-02-17 ENCOUNTER — Ambulatory Visit: Payer: Medicare HMO | Admitting: Gastroenterology

## 2019-02-17 MED ORDER — OXYCODONE-ACETAMINOPHEN 5-325 MG PO TABS: 1.0000 | ORAL_TABLET | Freq: Every day | ORAL | 0 refills | Status: DC | PRN

## 2019-02-17 NOTE — Telephone Encounter (Signed)
Pt request refill  oxyCODONE-acetaminophen (PERCOCET/ROXICET) 5-325 MG tablet  [address] [place], [place] - [place] [phone] (Phone) [phone] (Fax)

## 2019-02-17 NOTE — Telephone Encounter (Signed)
Done erx 

## 2019-02-23 ENCOUNTER — Other Ambulatory Visit (HOSPITAL_COMMUNITY): Payer: Self-pay | Admitting: Gastroenterology

## 2019-02-23 ENCOUNTER — Other Ambulatory Visit: Payer: Self-pay | Admitting: Gastroenterology

## 2019-02-23 DIAGNOSIS — R1011 Right upper quadrant pain: Secondary | ICD-10-CM

## 2019-03-03 ENCOUNTER — Other Ambulatory Visit: Payer: Self-pay | Admitting: Internal Medicine

## 2019-03-03 DIAGNOSIS — G4733 Obstructive sleep apnea (adult) (pediatric): Secondary | ICD-10-CM | POA: Diagnosis not present

## 2019-03-03 NOTE — Telephone Encounter (Signed)
Done erx 

## 2019-03-07 ENCOUNTER — Ambulatory Visit (HOSPITAL_COMMUNITY)
Admission: RE | Admit: 2019-03-07 | Discharge: 2019-03-07 | Disposition: A | Discharge status: Home / Self Care | Payer: Medicare HMO | Source: Ambulatory Visit | Attending: Gastroenterology | Admitting: Gastroenterology

## 2019-03-07 DIAGNOSIS — R1011 Right upper quadrant pain: Secondary | ICD-10-CM

## 2019-03-07 MED ORDER — TECHNETIUM TC 99M MEBROFENIN IV KIT
5.0000 | PACK | Freq: Once | INTRAVENOUS | Status: AC | PRN
  Administered 2019-03-07: 5 via INTRAVENOUS

## 2019-03-08 DIAGNOSIS — J029 Acute pharyngitis, unspecified: Secondary | ICD-10-CM | POA: Diagnosis not present

## 2019-03-12 DIAGNOSIS — G4733 Obstructive sleep apnea (adult) (pediatric): Secondary | ICD-10-CM | POA: Diagnosis not present

## 2019-03-14 DIAGNOSIS — R197 Diarrhea, unspecified: Secondary | ICD-10-CM | POA: Diagnosis not present

## 2019-03-14 DIAGNOSIS — K580 Irritable bowel syndrome with diarrhea: Secondary | ICD-10-CM | POA: Diagnosis not present

## 2019-03-14 DIAGNOSIS — R140 Abdominal distension (gaseous): Secondary | ICD-10-CM | POA: Diagnosis not present

## 2019-03-15 ENCOUNTER — Ambulatory Visit (INDEPENDENT_AMBULATORY_CARE_PROVIDER_SITE_OTHER)
Admission: RE | Admit: 2019-03-15 | Discharge: 2019-03-15 | Disposition: A | Discharge status: Home / Self Care | Payer: Medicare HMO | Source: Ambulatory Visit | Attending: Internal Medicine | Admitting: Internal Medicine

## 2019-03-15 ENCOUNTER — Other Ambulatory Visit: Payer: Self-pay

## 2019-03-15 ENCOUNTER — Ambulatory Visit (INDEPENDENT_AMBULATORY_CARE_PROVIDER_SITE_OTHER): Payer: Medicare HMO | Admitting: Internal Medicine

## 2019-03-15 ENCOUNTER — Encounter: Payer: Self-pay | Admitting: Internal Medicine

## 2019-03-15 DIAGNOSIS — R634 Abnormal weight loss: Secondary | ICD-10-CM

## 2019-03-15 DIAGNOSIS — R109 Unspecified abdominal pain: Secondary | ICD-10-CM | POA: Diagnosis not present

## 2019-03-15 DIAGNOSIS — R1013 Epigastric pain: Secondary | ICD-10-CM | POA: Diagnosis not present

## 2019-03-15 DIAGNOSIS — R69 Illness, unspecified: Secondary | ICD-10-CM | POA: Diagnosis not present

## 2019-03-15 DIAGNOSIS — F411 Generalized anxiety disorder: Secondary | ICD-10-CM | POA: Diagnosis not present

## 2019-03-15 DIAGNOSIS — I10 Essential (primary) hypertension: Secondary | ICD-10-CM

## 2019-03-15 MED ORDER — DULOXETINE HCL 30 MG PO CPEP: 30.0000 mg | ORAL_CAPSULE | Freq: Every day | ORAL | 3 refills | Status: DC

## 2019-03-15 NOTE — Progress Notes (Signed)
Subjective:    Patient ID: Darren ShearerJohn Black, male    DOB: 01-Feb-1966, 53 y.o.   MRN: [id number]  HPI  Here to f/u, has had persistent unexplained RUQ pain worse with eating and has been cutting back on calories due to the pain, and has extensive recent eval per Dr Darren Black incluiding CT abd/pelvis, EGD and RUQ u/s, essentially neg for any acute or GB issue.  Still with significant pain and emotional distres, even cut out taking essentially of his medications one by one thinking they may be causing the pain  Has marked increased anxiety and stress off the cymbalta, asking to restart but at a lower dose.  In addition to wt loss, has had improved BP control it seems with documented BP in the 90s at home on triple med therapy and dizziness intermittent. Wt Readings from Last 3 Encounters:  03/15/19 252 lb (114.3 kg)  02/08/19 279 lb (126.6 kg)  02/05/19 285 lb (129.3 kg)  To start xifaximin tomorrow.as trial tx for 2 wks per GI.  Did finish recent antibx course for uvulitis per UC.   Past Medical History:  Diagnosis Date  . Anxiety   . BPH (benign prostatic hypertrophy)   . Chronic lower back pain    nerve damage w/ leg pain  . Depression   . Family history of adverse reaction to anesthesia    MOTHER--- HARD TO WAKE  . History of panic attacks   . Hyperlipidemia   . Hypertension   . Meatal stenosis   . OSA on CPAP    moderate to severe OSA  per study 04-28-2007 uses CPAP  . PONV (postoperative nausea and vomiting)    Past Surgical History:  Procedure Laterality Date  . APPENDECTOMY  2003  . BREATH TEK H PYLORI  04/27/2012   Procedure: BREATH TEK H PYLORI;  Surgeon: Valarie MerinoMatthew B Martin, MD;  Location[facility];  Service: General;  Laterality: N/A;  . CYSTOSCOPY WITH URETHRAL DILATATION N/A 04/12/2015   Procedure: CYSTOSCOPY WITH URETHRAL DILATATION, RETROGRADE AND URETHROGRAM WITH BLADDER BIOPSY;  Surgeon: Bjorn PippinJohn Wrenn, MD;  [facility];  Service: Urology;  Laterality:  N/A;  . ROTATOR CUFF REPAIR Right 01-21-2012  . TRANSTHORACIC ECHOCARDIOGRAM  03-26-2007   normal LVSF, ef 60%,  mild AV calcification without stenosis,  mild MR and TR,  mild LAE  . URETHROGRAM N/A 04/12/2015   Procedure: URETHROGRAM;  Surgeon: Bjorn PippinJohn Wrenn, MD;  [facility];  Service: Urology;  Laterality: N/A;    reports that he has never smoked. He has never used smokeless tobacco. He reports current alcohol use. He reports that he does not use drugs. family history includes Cancer in his father; Coronary artery disease in his father; Diabetes in an other family member; Hyperlipidemia in an other family member; Hypertension in an other family member; Stroke in an other family member. Allergies  Allergen Reactions  . Crestor [Rosuvastatin Calcium] Other (See Comments)    abd pain  . Flomax [Tamsulosin Hcl] Rash   Current Outpatient Medications on File Prior to Visit  Medication Sig Dispense Refill  . amLODipine (NORVASC) 5 MG tablet Take 1 tablet by mouth once daily 90 tablet 0  . benazepril (LOTENSIN) 40 MG tablet Take 1 tablet (40 mg total) by mouth daily. 90 tablet 3  . doxazosin (CARDURA) 4 MG tablet Take 1 tablet (4 mg total) by mouth at bedtime. 90 tablet 3  . LORazepam (ATIVAN) 1 MG tablet Take 1 tablet by  mouth twice daily as needed for anxiety 60 tablet 2  . Lycopene 10 MG CAPS Take 1 capsule by mouth every evening.    . Omega 3-6-9 Fatty Acids (OMEGA 3-6-9 COMPLEX PO) Take 1 capsule by mouth daily.     Marland Kitchen. oxyCODONE-acetaminophen (PERCOCET/ROXICET) 5-325 MG tablet Take 1 tablet by mouth daily as needed for severe pain. 30 tablet 0  . pantoprazole (PROTONIX) 40 MG tablet Take 1 tablet (40 mg total) by mouth 2 (two) times daily before a meal. 60 tablet 1  . pravastatin (PRAVACHOL) 80 MG tablet Take 1 tablet (80 mg total) by mouth daily. 90 tablet 3  . rosuvastatin (CRESTOR) 20 MG tablet Take 1 tablet (20 mg total) by mouth daily. 90 tablet 3  . sucralfate  (CARAFATE) 1 g tablet Take 1 tablet (1 g total) by mouth 4 (four) times daily -  with meals and at bedtime. 20 tablet 0  . tamsulosin (FLOMAX) 0.4 MG CAPS capsule Take 1 capsule (0.4 mg total) by mouth daily. 90 capsule 3  . testosterone (ANDROGEL) 50 MG/5GM (1%) GEL Place 5 g onto the skin daily. 450 g 1  . triamcinolone (NASACORT) 55 MCG/ACT AERO nasal inhaler Place 2 sprays into the nose daily. 1 Inhaler 12  . [DISCONTINUED] potassium chloride (KLOR-CON 10) 10 MEQ CR tablet Take 1 tablet (10 mEq total) by mouth daily. 90 tablet 3   No current facility-administered medications on file prior to visit.    Review of Systems  Constitutional: Negative for other unusual diaphoresis or sweats HENT: Negative for ear discharge or swelling Eyes: Negative for other worsening visual disturbances Respiratory: Negative for stridor or other swelling  Gastrointestinal: Negative for worsening distension or other blood Genitourinary: Negative for retention or other urinary change Musculoskeletal: Negative for other MSK pain or swelling Skin: Negative for color change or other new lesions Neurological: Negative for worsening tremors and other numbness  Psychiatric/Behavioral: Negative for worsening agitation or other fatigue All other system neg per pt    Objective:   Physical Exam BP 116/68   Pulse 96   Temp 98.4 F (36.9 C) (Oral)   Ht 5\' 7"  (1.702 m)   Wt 252 lb (114.3 kg)   SpO2 98%   BMI 39.47 kg/m  VS noted,  Constitutional: Pt appears in NAD HENT: Head: NCAT.  Right Ear: External ear normal.  Left Ear: External ear normal.  Eyes: . Pupils are equal, round, and reactive to light. Conjunctivae and EOM are normal Nose: without d/c or deformity Neck: Neck supple. Gross normal ROM Cardiovascular: Normal rate and regular rhythm.   Pulmonary/Chest: Effort normal and breath sounds without rales or wheezing.  Abd:  Soft, NT, ND, + BS, no organomegaly Neurological: Pt is alert. At baseline  orientation, motor grossly intact Skin: Skin is warm. No rashes, other new lesions, no LE edema Psychiatric: Pt behavior is normal without agitation  No other exam findings Lab Results  Component Value Date   WBC 8.3 02/05/2019   HGB 15.9 02/05/2019   HCT 46.7 02/05/2019   PLT 185 02/05/2019   GLUCOSE 117 (H) 02/05/2019   CHOL 235 (H) 01/12/2019   TRIG 148.0 01/12/2019   HDL 43.40 01/12/2019   LDLDIRECT 139.0 01/02/2016   LDLCALC 162 (H) 01/12/2019   ALT 35 02/05/2019   AST 23 02/05/2019   NA 139 02/05/2019   K 3.2 (L) 02/05/2019   CL 103 02/05/2019   CREATININE 1.20 02/05/2019   BUN 21 (H) 02/05/2019   CO2  24 02/05/2019   TSH 2.22 01/12/2019   PSA 0.97 01/12/2019   HGBA1C 5.8 01/12/2019        Assessment & Plan:

## 2019-03-15 NOTE — Patient Instructions (Signed)
OK to stop the Fluid pill and Potassium  OK to take the Cymbalta at 30 mg per day  Please continue to monitor the BP and call in 2-3 wks if the BP is still on the low side, such as fairly often less than 120/80.    Please continue all other medications as before, and refills have been done if requested.  Please have the pharmacy call with any other refills you may need.  Please keep your appointments with your specialists as you may have planned  Please go to the XRAY Department in the Basement (go straight as you get off the elevator) for the x-ray testing  You will be contacted by phone if any changes need to be made immediately.  Otherwise, you will receive a letter about your results with an explanation, but please check with MyChart first.  Please remember to sign up for MyChart if you have not done so, as this will be important to you in the future with finding out test results, communicating by private email, and scheduling acute appointments online when needed.

## 2019-03-15 NOTE — Assessment & Plan Note (Signed)
Ok to restart cymbalta at 30 mg daily, declines counseling referral

## 2019-03-15 NOTE — Assessment & Plan Note (Signed)
With recent wt loss appears to be overcontrolled, to d/c the HCT, cont ACE and amlodipine for now, check BP and call in 2-3 wks for persistent Low BP < 120/90

## 2019-03-15 NOTE — Assessment & Plan Note (Signed)
Also for cxr,  to f/u any worsening symptoms or concerns 

## 2019-03-15 NOTE — Assessment & Plan Note (Signed)
eval to date neg for specific etiology, to cont current tx, f/u GI as planned

## 2019-03-17 ENCOUNTER — Telehealth: Payer: Self-pay | Admitting: Internal Medicine

## 2019-03-17 ENCOUNTER — Telehealth: Payer: Self-pay | Admitting: Pulmonary Disease

## 2019-03-17 DIAGNOSIS — G4733 Obstructive sleep apnea (adult) (pediatric): Secondary | ICD-10-CM

## 2019-03-17 NOTE — Telephone Encounter (Signed)
LMTCB at mobile line. Number provided rings busy after 4 attempts to call. Compliance report available on air view. We need a little more information as far as what symptoms he is having and why he feels he needs his pressures lowered. Will await a call back.

## 2019-03-17 NOTE — Telephone Encounter (Signed)
Please change CPAP settings to Auto Set 5-12 cmH20. Please schedule follow up e-visit in 1-2 weeks with download  for follow up with either Dr. Elsworth Soho or APP. Thanks.

## 2019-03-17 NOTE — Telephone Encounter (Signed)
Returned phone call to patient, he states he feels like the pressure is too high. He states the cpap is drying out his nose, mouth, and throat despite the humidification. He states he has been having  increased abdominal pain and feeling bloated. He went to see his GI and they did not find anything.   Complaince report printed and given to [place]. Please advise. Thanks.

## 2019-03-17 NOTE — Telephone Encounter (Signed)
Relation to pt: self  Call back number: [phone]   Reason for call:   Gastro prescribed rifaximin due to stomach pain, antibiotic is working but making patient feel sleepy. Spouse would like to know can  Rifaximin cause BP to decrease?  Spouse states patient BP today reads 105/75 and yesterday 105/65 therefore PCP instructed to d/c  amLODipine (NORVASC) 5 MG tablet if BP dropped below 110, spouse seeking clinical advice, please advise today if possible.

## 2019-03-17 NOTE — Telephone Encounter (Signed)
[name] wife Vida Roller is calling back [phone]

## 2019-03-17 NOTE — Telephone Encounter (Signed)
Call returned to patient, made aware of [place] recommendations. Aware the order has been sent to the DME company. Voiced understanding. Nothing further is needed at this time.

## 2019-03-18 NOTE — Telephone Encounter (Signed)
No, the antibiotic would not affect BP  Ok to stop the amlodipine for now though and continue to monitor BP every 1-2 days

## 2019-03-18 NOTE — Telephone Encounter (Signed)
Patient's wife informed of below.  

## 2019-03-22 ENCOUNTER — Telehealth: Payer: Self-pay | Admitting: Internal Medicine

## 2019-03-22 ENCOUNTER — Inpatient Hospital Stay (HOSPITAL_COMMUNITY)
Admission: EM | Admit: 2019-03-22 | Discharge: 2019-03-25 | DRG: 418 | Disposition: A | Discharge status: Home / Self Care | Payer: Medicare HMO | Attending: Internal Medicine | Admitting: Internal Medicine

## 2019-03-22 ENCOUNTER — Ambulatory Visit: Payer: Self-pay | Admitting: Internal Medicine

## 2019-03-22 ENCOUNTER — Encounter (HOSPITAL_COMMUNITY): Payer: Self-pay

## 2019-03-22 ENCOUNTER — Other Ambulatory Visit: Payer: Self-pay

## 2019-03-22 DIAGNOSIS — N183 Chronic kidney disease, stage 3 unspecified: Secondary | ICD-10-CM | POA: Diagnosis present

## 2019-03-22 DIAGNOSIS — R197 Diarrhea, unspecified: Secondary | ICD-10-CM

## 2019-03-22 DIAGNOSIS — Z7951 Long term (current) use of inhaled steroids: Secondary | ICD-10-CM

## 2019-03-22 DIAGNOSIS — Z888 Allergy status to other drugs, medicaments and biological substances status: Secondary | ICD-10-CM

## 2019-03-22 DIAGNOSIS — G8929 Other chronic pain: Secondary | ICD-10-CM | POA: Diagnosis present

## 2019-03-22 DIAGNOSIS — I959 Hypotension, unspecified: Secondary | ICD-10-CM

## 2019-03-22 DIAGNOSIS — R1084 Generalized abdominal pain: Secondary | ICD-10-CM

## 2019-03-22 DIAGNOSIS — R634 Abnormal weight loss: Secondary | ICD-10-CM

## 2019-03-22 DIAGNOSIS — Z1159 Encounter for screening for other viral diseases: Secondary | ICD-10-CM

## 2019-03-22 DIAGNOSIS — E278 Other specified disorders of adrenal gland: Secondary | ICD-10-CM | POA: Diagnosis present

## 2019-03-22 DIAGNOSIS — N179 Acute kidney failure, unspecified: Secondary | ICD-10-CM

## 2019-03-22 DIAGNOSIS — K819 Cholecystitis, unspecified: Secondary | ICD-10-CM

## 2019-03-22 DIAGNOSIS — I129 Hypertensive chronic kidney disease with stage 1 through stage 4 chronic kidney disease, or unspecified chronic kidney disease: Secondary | ICD-10-CM | POA: Diagnosis present

## 2019-03-22 DIAGNOSIS — R55 Syncope and collapse: Secondary | ICD-10-CM | POA: Diagnosis present

## 2019-03-22 DIAGNOSIS — K811 Chronic cholecystitis: Secondary | ICD-10-CM | POA: Diagnosis not present

## 2019-03-22 DIAGNOSIS — N323 Diverticulum of bladder: Secondary | ICD-10-CM | POA: Diagnosis present

## 2019-03-22 DIAGNOSIS — K859 Acute pancreatitis without necrosis or infection, unspecified: Secondary | ICD-10-CM

## 2019-03-22 DIAGNOSIS — J309 Allergic rhinitis, unspecified: Secondary | ICD-10-CM | POA: Diagnosis present

## 2019-03-22 DIAGNOSIS — Z6841 Body Mass Index (BMI) 40.0 and over, adult: Secondary | ICD-10-CM

## 2019-03-22 DIAGNOSIS — I10 Essential (primary) hypertension: Secondary | ICD-10-CM | POA: Diagnosis not present

## 2019-03-22 DIAGNOSIS — R748 Abnormal levels of other serum enzymes: Secondary | ICD-10-CM | POA: Diagnosis not present

## 2019-03-22 DIAGNOSIS — Z79890 Hormone replacement therapy (postmenopausal): Secondary | ICD-10-CM

## 2019-03-22 DIAGNOSIS — E230 Hypopituitarism: Secondary | ICD-10-CM | POA: Diagnosis present

## 2019-03-22 DIAGNOSIS — F410 Panic disorder [episodic paroxysmal anxiety] without agoraphobia: Secondary | ICD-10-CM | POA: Diagnosis present

## 2019-03-22 DIAGNOSIS — N401 Enlarged prostate with lower urinary tract symptoms: Secondary | ICD-10-CM | POA: Diagnosis present

## 2019-03-22 DIAGNOSIS — F411 Generalized anxiety disorder: Secondary | ICD-10-CM | POA: Diagnosis present

## 2019-03-22 DIAGNOSIS — Z9049 Acquired absence of other specified parts of digestive tract: Secondary | ICD-10-CM

## 2019-03-22 DIAGNOSIS — Z8249 Family history of ischemic heart disease and other diseases of the circulatory system: Secondary | ICD-10-CM

## 2019-03-22 DIAGNOSIS — E861 Hypovolemia: Secondary | ICD-10-CM | POA: Diagnosis present

## 2019-03-22 DIAGNOSIS — N138 Other obstructive and reflux uropathy: Secondary | ICD-10-CM | POA: Diagnosis present

## 2019-03-22 DIAGNOSIS — E876 Hypokalemia: Secondary | ICD-10-CM

## 2019-03-22 DIAGNOSIS — E860 Dehydration: Secondary | ICD-10-CM | POA: Diagnosis present

## 2019-03-22 DIAGNOSIS — R109 Unspecified abdominal pain: Secondary | ICD-10-CM | POA: Diagnosis present

## 2019-03-22 DIAGNOSIS — G4733 Obstructive sleep apnea (adult) (pediatric): Secondary | ICD-10-CM | POA: Diagnosis present

## 2019-03-22 DIAGNOSIS — Z79899 Other long term (current) drug therapy: Secondary | ICD-10-CM

## 2019-03-22 DIAGNOSIS — Z419 Encounter for procedure for purposes other than remedying health state, unspecified: Secondary | ICD-10-CM

## 2019-03-22 DIAGNOSIS — E785 Hyperlipidemia, unspecified: Secondary | ICD-10-CM | POA: Diagnosis present

## 2019-03-22 DIAGNOSIS — F329 Major depressive disorder, single episode, unspecified: Secondary | ICD-10-CM | POA: Diagnosis present

## 2019-03-22 LAB — CBG MONITORING, ED: Glucose-Capillary: 106 mg/dL — ABNORMAL HIGH (ref 70–99)

## 2019-03-22 NOTE — ED Triage Notes (Signed)
Pt here for abd pain, pt lethargic, pale and diaphoretic in triage. Pt slow to respond to questions. Pt states "I feel tingly all over". BP soft in triage

## 2019-03-22 NOTE — Telephone Encounter (Signed)
[name] wife states that she would like pt admitted into the hospital. Advised her that in order to do that she would have to take pt to ED. Wife agreed and would take pt to [facility].

## 2019-03-22 NOTE — Telephone Encounter (Signed)
Ok to let pt know she is free to call Dr Benson Norway who is already his gastroenterologist in the past month

## 2019-03-22 NOTE — Telephone Encounter (Signed)
Pt. And wife report pt. Has had abdominal pain since June. He has several studies. Still having right sided pain. Worse when he eats. Has not eaten much in 2 days per wife. He is drinking and staying hydrated. Request a visit with Dr. Jenny Reichmann. Warm transfer to [facility] in the practice.  Answer Assessment - Initial Assessment Questions 1. LOCATION: "Where does it hurt?"      Hurts right at the ribs and goes across 2. RADIATION: "Does the pain shoot anywhere else?" (e.g., chest, back)     No 3. ONSET: "When did the pain begin?" (Minutes, hours or days ago)      Beginning June 4. SUDDEN: "Gradual or sudden onset?"     Gradual 5. PATTERN "Does the pain come and go, or is it constant?"    - If constant: "Is it getting better, staying the same, or worsening?"      (Note: Constant means the pain never goes away completely; most serious pain is constant and it progresses)     - If intermittent: "How long does it last?" "Do you have pain now?"     (Note: Intermittent means the pain goes away completely between bouts)     Constant and worse when he eats 6. SEVERITY: "How bad is the pain?"  (e.g., Scale 1-10; mild, moderate, or severe)    - MILD (1-3): doesn't interfere with normal activities, abdomen soft and not tender to touch     - MODERATE (4-7): interferes with normal activities or awakens from sleep, tender to touch     - SEVERE (8-10): excruciating pain, doubled over, unable to do any normal activities       Now - 5 7. RECURRENT SYMPTOM: "Have you ever had this type of abdominal pain before?" If so, ask: "When was the last time?" and "What happened that time?"      No 8. CAUSE: "What do you think is causing the abdominal pain?"     Unsure 9. RELIEVING/AGGRAVATING FACTORS: "What makes it better or worse?" (e.g., movement, antacids, bowel movement)     Eating makes it worse 10. OTHER SYMPTOMS: "Has there been any vomiting, diarrhea, constipation, or urine problems?"       Diarrhea  Protocols  used: ABDOMINAL PAIN - MALE-A-AH

## 2019-03-22 NOTE — Telephone Encounter (Signed)
[name] wife calling in.  States that she isn't taking her husband to the ED because they aren't allowing her to stay with spouse.   [name] wife wants to know if she can get a referral to a GI specialist.   [name] wife seems very frustrated that she can't help her spouse at this time.

## 2019-03-22 NOTE — Telephone Encounter (Signed)
[name] wife has been informed and stated that she has already reached out to Dr. Benson Norway in regards to a GI surgeon for Darren Black. She is waiting for a call back from them. She stated that she is impatient since they have not returned her call and wanted PCP to do a referral to a surgeon. I told her I would let PCP know but suggest that she should wait for a call back from Dr. Minerva Black office.

## 2019-03-22 NOTE — Telephone Encounter (Signed)
Unfortunately, the best way to see a surgeon quickly would be at the ED.  I dont really have a good reason as his recent evaluation did not show a surgical problem

## 2019-03-22 NOTE — Telephone Encounter (Signed)
Noted  

## 2019-03-23 ENCOUNTER — Encounter (HOSPITAL_COMMUNITY): Payer: Self-pay | Admitting: General Practice

## 2019-03-23 ENCOUNTER — Emergency Department (HOSPITAL_COMMUNITY): Payer: Medicare HMO

## 2019-03-23 DIAGNOSIS — E278 Other specified disorders of adrenal gland: Secondary | ICD-10-CM | POA: Diagnosis present

## 2019-03-23 DIAGNOSIS — R197 Diarrhea, unspecified: Secondary | ICD-10-CM

## 2019-03-23 DIAGNOSIS — I9589 Other hypotension: Secondary | ICD-10-CM | POA: Diagnosis not present

## 2019-03-23 DIAGNOSIS — R748 Abnormal levels of other serum enzymes: Secondary | ICD-10-CM | POA: Diagnosis not present

## 2019-03-23 DIAGNOSIS — N189 Chronic kidney disease, unspecified: Secondary | ICD-10-CM | POA: Diagnosis not present

## 2019-03-23 DIAGNOSIS — Z1159 Encounter for screening for other viral diseases: Secondary | ICD-10-CM | POA: Diagnosis not present

## 2019-03-23 DIAGNOSIS — N183 Chronic kidney disease, stage 3 (moderate): Secondary | ICD-10-CM | POA: Diagnosis not present

## 2019-03-23 DIAGNOSIS — K819 Cholecystitis, unspecified: Secondary | ICD-10-CM | POA: Diagnosis not present

## 2019-03-23 DIAGNOSIS — K858 Other acute pancreatitis without necrosis or infection: Secondary | ICD-10-CM

## 2019-03-23 DIAGNOSIS — N323 Diverticulum of bladder: Secondary | ICD-10-CM | POA: Diagnosis not present

## 2019-03-23 DIAGNOSIS — Z8719 Personal history of other diseases of the digestive system: Secondary | ICD-10-CM | POA: Diagnosis not present

## 2019-03-23 DIAGNOSIS — R1084 Generalized abdominal pain: Secondary | ICD-10-CM | POA: Diagnosis not present

## 2019-03-23 DIAGNOSIS — Z6841 Body Mass Index (BMI) 40.0 and over, adult: Secondary | ICD-10-CM | POA: Diagnosis not present

## 2019-03-23 DIAGNOSIS — N401 Enlarged prostate with lower urinary tract symptoms: Secondary | ICD-10-CM | POA: Diagnosis present

## 2019-03-23 DIAGNOSIS — R109 Unspecified abdominal pain: Secondary | ICD-10-CM | POA: Diagnosis present

## 2019-03-23 DIAGNOSIS — N138 Other obstructive and reflux uropathy: Secondary | ICD-10-CM | POA: Diagnosis not present

## 2019-03-23 DIAGNOSIS — F329 Major depressive disorder, single episode, unspecified: Secondary | ICD-10-CM | POA: Diagnosis present

## 2019-03-23 DIAGNOSIS — K811 Chronic cholecystitis: Secondary | ICD-10-CM | POA: Diagnosis not present

## 2019-03-23 DIAGNOSIS — N179 Acute kidney failure, unspecified: Secondary | ICD-10-CM | POA: Diagnosis not present

## 2019-03-23 DIAGNOSIS — I959 Hypotension, unspecified: Secondary | ICD-10-CM

## 2019-03-23 DIAGNOSIS — G4733 Obstructive sleep apnea (adult) (pediatric): Secondary | ICD-10-CM | POA: Diagnosis not present

## 2019-03-23 DIAGNOSIS — I129 Hypertensive chronic kidney disease with stage 1 through stage 4 chronic kidney disease, or unspecified chronic kidney disease: Secondary | ICD-10-CM | POA: Diagnosis not present

## 2019-03-23 DIAGNOSIS — E785 Hyperlipidemia, unspecified: Secondary | ICD-10-CM | POA: Diagnosis not present

## 2019-03-23 DIAGNOSIS — R1011 Right upper quadrant pain: Secondary | ICD-10-CM | POA: Diagnosis not present

## 2019-03-23 DIAGNOSIS — G8929 Other chronic pain: Secondary | ICD-10-CM | POA: Diagnosis present

## 2019-03-23 DIAGNOSIS — R55 Syncope and collapse: Secondary | ICD-10-CM | POA: Diagnosis not present

## 2019-03-23 DIAGNOSIS — E860 Dehydration: Secondary | ICD-10-CM | POA: Diagnosis not present

## 2019-03-23 DIAGNOSIS — E861 Hypovolemia: Secondary | ICD-10-CM

## 2019-03-23 DIAGNOSIS — E230 Hypopituitarism: Secondary | ICD-10-CM | POA: Diagnosis not present

## 2019-03-23 DIAGNOSIS — R69 Illness, unspecified: Secondary | ICD-10-CM | POA: Diagnosis not present

## 2019-03-23 DIAGNOSIS — K859 Acute pancreatitis without necrosis or infection, unspecified: Secondary | ICD-10-CM

## 2019-03-23 DIAGNOSIS — E876 Hypokalemia: Secondary | ICD-10-CM | POA: Diagnosis not present

## 2019-03-23 DIAGNOSIS — F411 Generalized anxiety disorder: Secondary | ICD-10-CM | POA: Diagnosis present

## 2019-03-23 DIAGNOSIS — I10 Essential (primary) hypertension: Secondary | ICD-10-CM | POA: Diagnosis not present

## 2019-03-23 DIAGNOSIS — R634 Abnormal weight loss: Secondary | ICD-10-CM | POA: Diagnosis present

## 2019-03-23 DIAGNOSIS — F410 Panic disorder [episodic paroxysmal anxiety] without agoraphobia: Secondary | ICD-10-CM | POA: Diagnosis present

## 2019-03-23 HISTORY — DX: Diarrhea, unspecified: R19.7

## 2019-03-23 LAB — COMPREHENSIVE METABOLIC PANEL
ALT: 22 U/L (ref 0–44)
AST: 19 U/L (ref 15–41)
Albumin: 4.2 g/dL (ref 3.5–5.0)
Alkaline Phosphatase: 43 U/L (ref 38–126)
Anion gap: 16 — ABNORMAL HIGH (ref 5–15)
BUN: 8 mg/dL (ref 6–20)
CO2: 21 mmol/L — ABNORMAL LOW (ref 22–32)
Calcium: 9.5 mg/dL (ref 8.9–10.3)
Chloride: 100 mmol/L (ref 98–111)
Creatinine, Ser: 1.84 mg/dL — ABNORMAL HIGH (ref 0.61–1.24)
GFR calc Af Amer: 48 mL/min — ABNORMAL LOW (ref >60)
GFR calc non Af Amer: 41 mL/min — ABNORMAL LOW (ref >60)
Glucose, Bld: 100 mg/dL — ABNORMAL HIGH (ref 70–99)
Potassium: 3 mmol/L — ABNORMAL LOW (ref 3.5–5.1)
Sodium: 137 mmol/L (ref 135–145)
Total Bilirubin: 1.3 mg/dL — ABNORMAL HIGH (ref 0.3–1.2)
Total Protein: 7 g/dL (ref 6.5–8.1)

## 2019-03-23 LAB — URINALYSIS, ROUTINE W REFLEX MICROSCOPIC
Bilirubin Urine: NEGATIVE
Glucose, UA: NEGATIVE mg/dL
Ketones, ur: 20 mg/dL — AB
Nitrite: NEGATIVE
Protein, ur: NEGATIVE mg/dL
Specific Gravity, Urine: 1.006 (ref 1.005–1.030)
pH: 6 (ref 5.0–8.0)

## 2019-03-23 LAB — POCT I-STAT EG7
Acid-base deficit: 3 mmol/L — ABNORMAL HIGH (ref 0.0–2.0)
Bicarbonate: 21.5 mmol/L (ref 20.0–28.0)
Calcium, Ion: 1.18 mmol/L (ref 1.15–1.40)
HCT: 43 % (ref 39.0–52.0)
Hemoglobin: 14.6 g/dL (ref 13.0–17.0)
O2 Saturation: 66 %
Potassium: 3.6 mmol/L (ref 3.5–5.1)
Sodium: 138 mmol/L (ref 135–145)
TCO2: 23 mmol/L (ref 22–32)
pCO2, Ven: 37 mmHg — ABNORMAL LOW (ref 44.0–60.0)
pH, Ven: 7.372 (ref 7.250–7.430)
pO2, Ven: 35 mmHg (ref 32.0–45.0)

## 2019-03-23 LAB — CBC WITH DIFFERENTIAL/PLATELET
Abs Immature Granulocytes: 0.02 10*3/uL (ref 0.00–0.07)
Basophils Absolute: 0.1 10*3/uL (ref 0.0–0.1)
Basophils Relative: 1 %
Eosinophils Absolute: 0.2 10*3/uL (ref 0.0–0.5)
Eosinophils Relative: 3 %
HCT: 44.4 % (ref 39.0–52.0)
Hemoglobin: 15.3 g/dL (ref 13.0–17.0)
Immature Granulocytes: 0 %
Lymphocytes Relative: 28 %
Lymphs Abs: 1.8 10*3/uL (ref 0.7–4.0)
MCH: 27.7 pg (ref 26.0–34.0)
MCHC: 34.5 g/dL (ref 30.0–36.0)
MCV: 80.4 fL (ref 80.0–100.0)
Monocytes Absolute: 0.5 10*3/uL (ref 0.1–1.0)
Monocytes Relative: 7 %
Neutro Abs: 4 10*3/uL (ref 1.7–7.7)
Neutrophils Relative %: 61 %
Platelets: 248 10*3/uL (ref 150–400)
RBC: 5.52 MIL/uL (ref 4.22–5.81)
RDW: 12.8 % (ref 11.5–15.5)
WBC: 6.5 10*3/uL (ref 4.0–10.5)
nRBC: 0 % (ref 0.0–0.2)

## 2019-03-23 LAB — RAPID URINE DRUG SCREEN, HOSP PERFORMED
Amphetamines: NOT DETECTED
Barbiturates: NOT DETECTED
Benzodiazepines: POSITIVE — AB
Cocaine: NOT DETECTED
Opiates: NOT DETECTED
Tetrahydrocannabinol: NOT DETECTED

## 2019-03-23 LAB — SARS CORONAVIRUS 2 BY RT PCR (HOSPITAL ORDER, PERFORMED IN ~~LOC~~ HOSPITAL LAB): SARS Coronavirus 2: NEGATIVE

## 2019-03-23 LAB — LIPASE, BLOOD: Lipase: 73 U/L — ABNORMAL HIGH (ref 11–51)

## 2019-03-23 LAB — LACTIC ACID, PLASMA
Lactic Acid, Venous: 1.5 mmol/L (ref 0.5–1.9)
Lactic Acid, Venous: 1.8 mmol/L (ref 0.5–1.9)

## 2019-03-23 MED ORDER — SODIUM CHLORIDE 0.9 % IV SOLN
INTRAVENOUS | Status: DC
  Administered 2019-03-23: 06:00:00 via INTRAVENOUS

## 2019-03-23 MED ORDER — CHLORHEXIDINE GLUCONATE 0.12 % MT SOLN
15.0000 mL | Freq: Two times a day (BID) | OROMUCOSAL | Status: DC
  Administered 2019-03-23 – 2019-03-24 (×4): 15 mL via OROMUCOSAL
  Filled 2019-03-23 (×4): qty 15

## 2019-03-23 MED ORDER — ONDANSETRON HCL 4 MG/2ML IJ SOLN
4.0000 mg | Freq: Once | INTRAMUSCULAR | Status: AC
  Administered 2019-03-23: 02:00:00 4 mg via INTRAVENOUS
  Filled 2019-03-23: qty 2

## 2019-03-23 MED ORDER — ONDANSETRON HCL 4 MG PO TABS: 4.0000 mg | ORAL_TABLET | Freq: Four times a day (QID) | ORAL | Status: DC | PRN

## 2019-03-23 MED ORDER — ENOXAPARIN SODIUM 40 MG/0.4ML ~~LOC~~ SOLN: 40.0000 mg | Freq: Every day | SUBCUTANEOUS | Status: DC

## 2019-03-23 MED ORDER — DULOXETINE HCL 30 MG PO CPEP
30.0000 mg | ORAL_CAPSULE | Freq: Every day | ORAL | Status: DC
  Administered 2019-03-23 – 2019-03-25 (×3): 30 mg via ORAL
  Filled 2019-03-23 (×3): qty 1

## 2019-03-23 MED ORDER — POTASSIUM CHLORIDE CRYS ER 20 MEQ PO TBCR
40.0000 meq | EXTENDED_RELEASE_TABLET | Freq: Once | ORAL | Status: AC
  Administered 2019-03-23: 05:00:00 40 meq via ORAL
  Filled 2019-03-23: qty 2

## 2019-03-23 MED ORDER — POTASSIUM CHLORIDE CRYS ER 20 MEQ PO TBCR
40.0000 meq | EXTENDED_RELEASE_TABLET | Freq: Once | ORAL | Status: AC
  Administered 2019-03-23: 40 meq via ORAL
  Filled 2019-03-23: qty 2

## 2019-03-23 MED ORDER — SODIUM CHLORIDE 0.9 % IV BOLUS
1000.0000 mL | Freq: Once | INTRAVENOUS | Status: AC
  Administered 2019-03-23: 1000 mL via INTRAVENOUS

## 2019-03-23 MED ORDER — BOOST / RESOURCE BREEZE PO LIQD CUSTOM
1.0000 | Freq: Three times a day (TID) | ORAL | Status: DC
  Administered 2019-03-24: 1 via ORAL

## 2019-03-23 MED ORDER — TAMSULOSIN HCL 0.4 MG PO CAPS
0.4000 mg | ORAL_CAPSULE | Freq: Every day | ORAL | Status: DC
  Administered 2019-03-23 – 2019-03-25 (×3): 0.4 mg via ORAL
  Filled 2019-03-23 (×3): qty 1

## 2019-03-23 MED ORDER — MORPHINE SULFATE (PF) 2 MG/ML IV SOLN: 2.0000 mg | INTRAVENOUS | Status: DC | PRN

## 2019-03-23 MED ORDER — MORPHINE SULFATE (PF) 4 MG/ML IV SOLN
4.0000 mg | Freq: Once | INTRAVENOUS | Status: AC
  Administered 2019-03-23: 4 mg via INTRAVENOUS
  Filled 2019-03-23: qty 1

## 2019-03-23 MED ORDER — ONDANSETRON HCL 4 MG/2ML IJ SOLN
4.0000 mg | Freq: Four times a day (QID) | INTRAMUSCULAR | Status: DC | PRN
  Administered 2019-03-24: 4 mg via INTRAVENOUS
  Filled 2019-03-23: qty 2

## 2019-03-23 MED ORDER — PANTOPRAZOLE SODIUM 40 MG PO TBEC
40.0000 mg | DELAYED_RELEASE_TABLET | Freq: Every day | ORAL | Status: DC
  Administered 2019-03-23 – 2019-03-25 (×3): 40 mg via ORAL
  Filled 2019-03-23 (×3): qty 1

## 2019-03-23 MED ORDER — ACETAMINOPHEN 325 MG PO TABS
650.0000 mg | ORAL_TABLET | Freq: Four times a day (QID) | ORAL | Status: DC | PRN
  Filled 2019-03-23: qty 2

## 2019-03-23 MED ORDER — SUCRALFATE 1 G PO TABS: 1.0000 g | ORAL_TABLET | Freq: Three times a day (TID) | ORAL | Status: DC

## 2019-03-23 MED ORDER — POTASSIUM CHLORIDE 10 MEQ/100ML IV SOLN
10.0000 meq | Freq: Once | INTRAVENOUS | Status: AC
  Administered 2019-03-23: 10 meq via INTRAVENOUS
  Filled 2019-03-23: qty 100

## 2019-03-23 MED ORDER — ORAL CARE MOUTH RINSE
15.0000 mL | Freq: Two times a day (BID) | OROMUCOSAL | Status: DC
  Administered 2019-03-23 (×2): 15 mL via OROMUCOSAL

## 2019-03-23 MED ORDER — LORAZEPAM 1 MG PO TABS
1.0000 mg | ORAL_TABLET | Freq: Two times a day (BID) | ORAL | Status: DC | PRN
  Administered 2019-03-23 (×2): 1 mg via ORAL
  Filled 2019-03-23 (×2): qty 1

## 2019-03-23 MED ORDER — ACETAMINOPHEN 650 MG RE SUPP: 650.0000 mg | Freq: Four times a day (QID) | RECTAL | Status: DC | PRN

## 2019-03-23 MED ORDER — ONDANSETRON HCL 4 MG/2ML IJ SOLN: 4.0000 mg | Freq: Three times a day (TID) | INTRAMUSCULAR | Status: DC | PRN

## 2019-03-23 MED ORDER — DEXTROSE-NACL 5-0.9 % IV SOLN
INTRAVENOUS | Status: DC
  Administered 2019-03-23 – 2019-03-24 (×4): via INTRAVENOUS

## 2019-03-23 MED ORDER — SUCRALFATE 1 GM/10ML PO SUSP
1.0000 g | Freq: Three times a day (TID) | ORAL | Status: DC
  Administered 2019-03-24: 1 g via ORAL
  Filled 2019-03-23 (×2): qty 10

## 2019-03-23 MED ORDER — DICYCLOMINE HCL 10 MG PO CAPS
10.0000 mg | ORAL_CAPSULE | Freq: Three times a day (TID) | ORAL | Status: DC
  Administered 2019-03-23 – 2019-03-24 (×3): 10 mg via ORAL
  Filled 2019-03-23 (×5): qty 1

## 2019-03-23 NOTE — Care Management Obs Status (Signed)
[place] NOTIFICATION   Patient Details  Name: Darren Black MRN: [id number] Date of Birth: March 11, 1966   Medicare Observation Status Notification Given:  Yes    Marilu Favre, RN 03/23/2019, 2:54 PM

## 2019-03-23 NOTE — Care Management (Signed)
This is a no charge note  Pending admission per Dr. Roxanne Mins  53 year old male with a past medical history of hypertension, hyperlipidemia, OSA on CPAP, BPH, CKD-3, depression with anxiety, who presents with abdominal pain.   Patient's abdominal pain has been going on for more than 6 weeks. Patient has intermittent diarrhea. Has weight loss by 45 pounds recently. Per EDP, pt had been seen in the emergency department and had right upper quadrant ultrasound showing no gallstones, CT of abdomen and pelvis which was negative.  He has seen his gastroenterologist, Dr. Benson Norway who did an endoscopy and found no evidence of H. pylori.  He had negative HIDA scan on 03/07/19. His gastroenterologist was going to refer him to a surgeon for possible biliary spasm.    Today, patient was found to have WBC 6.5, lactic acid 1.8, lipase is 73, pending COVID-19 test, urinalysis (hazy appearance, moderate amount of leukocyte, rare bacteria, WBC 6-10), potassium 3.0 which was repleted, worsening renal function, temperature normal, blood pressure 81/69 which improved to 98/70 after giving 1 liter normal saline bolus.  CT abdomen/pelvis is not impressive.  Patient is placed on MedSurg bed for observation.  Ivor Costa, MD  [facility]   If 7PM-7AM, please contact night-coverage [email or web address] Password [facility] 03/23/2019, 4:52 AM

## 2019-03-23 NOTE — H&P (Addendum)
History and Physical    Darren ShearerJohn Black ZOX:[id number] DOB: 1965-11-11 DOA: 03/22/2019  PCP: Corwin LevinsJohn, James W, MD   Patient coming from: Home   Chief Complaint: Abdominal pain  HPI: Darren Black is a 53 y.o. male with medical history significant of hypertension, dyslipidemia, obstructive sleep apnea, anxiety, depression and BPH.  Patient reports worsening abdominal pain for the last 6 weeks, which has been refractive to outpatient management.  He describes a post prandial abdominal pain, colicky in nature, moderate to severe in intensity, localized more in the epigastrium and right upper quadrant, occasionally radiated into his chest, associated with nausea but no vomiting.  He has noticed mild improvement of his symptoms with sucralfate. Over the last 3 days his symptoms have been worsening now associated with diarrhea.  Due to his symptoms he has decreased his p.o. intake which has resulted in significant weight loss.  He has been worked up as an outpatient by Dr. Elnoria HowardHung from gastroenterology including upper endoscopy and hepatobiliary nuclear scan.  With negative results.   ED Course: Patient was found in pain, dehydrated, initial work-up negative for biliary disease.  He was admitted for further symptom control.  Review of Systems:  1. General: No fevers, no chills, no weight gain or weight loss 2. ENT: No runny nose or sore throat, no hearing disturbances 3. Pulmonary: No dyspnea, cough, wheezing, or hemoptysis 4. Cardiovascular: No angina, claudication, lower extremity edema, pnd or orthopnea 5. Gastrointestinal: Positive nausea and vomiting, positive diarrhea as mentioned in HPI.  6. Hematology: No easy bruisability or frequent infections 7. Urology: No dysuria, hematuria or increased urinary frequency 8. Dermatology: No rashes. 9. Neurology: No seizures or paresthesias 10. Musculoskeletal: No joint pain or deformities  Past Medical History:  Diagnosis Date   Anxiety    BPH (benign  prostatic hypertrophy)    Chronic lower back pain    nerve damage w/ leg pain   Depression    Family history of adverse reaction to anesthesia    MOTHER--- HARD TO WAKE   History of panic attacks    Hyperlipidemia    Hypertension    Meatal stenosis    OSA on CPAP    moderate to severe OSA  per study 04-28-2007 uses CPAP   PONV (postoperative nausea and vomiting)     Past Surgical History:  Procedure Laterality Date   APPENDECTOMY  2003   BREATH TEK H PYLORI  04/27/2012   Procedure: BREATH TEK H PYLORI;  Surgeon: Valarie MerinoMatthew B Martin, MD;  [facility];  Service: General;  Laterality: N/A;   CYSTOSCOPY WITH URETHRAL DILATATION N/A 04/12/2015   Procedure: CYSTOSCOPY WITH URETHRAL DILATATION, RETROGRADE AND URETHROGRAM WITH BLADDER BIOPSY;  Surgeon: Bjorn PippinJohn Wrenn, MD;  [facility] [place];  Service: Urology;  Laterality: N/A;   ROTATOR CUFF REPAIR Right 01-21-2012   TRANSTHORACIC ECHOCARDIOGRAM  03-26-2007   normal LVSF, ef 60%,  mild AV calcification without stenosis,  mild MR and TR,  mild LAE   URETHROGRAM N/A 04/12/2015   Procedure: URETHROGRAM;  Surgeon: Bjorn PippinJohn Wrenn, MD;  [facility] ;  Service: Urology;  Laterality: N/A;     reports that he has never smoked. He has never used smokeless tobacco. He reports current alcohol use. He reports that he does not use drugs.  Allergies  Allergen Reactions   Crestor [Rosuvastatin Calcium] Other (See Comments)    abd pain   Flomax [Tamsulosin Hcl] Rash    Family History  Problem Relation Age of Onset  Coronary artery disease Father    Cancer Father        anaplastic thyroid cancer   Stroke Other        1st degree relative   Diabetes Other        1st degree relative   Hypertension Other    Hyperlipidemia Other      Prior to Admission medications   Medication Sig Start Date End Date Taking? Authorizing Provider  amLODipine (NORVASC) 5 MG tablet Take 1 tablet by  mouth once daily Patient taking differently: Take 5 mg by mouth daily as needed (high blood pressure).  11/02/18  Yes Biagio Borg, MD  benazepril (LOTENSIN) 40 MG tablet Take 1 tablet (40 mg total) by mouth daily. 07/12/18  Yes Biagio Borg, MD  doxazosin (CARDURA) 4 MG tablet Take 1 tablet (4 mg total) by mouth at bedtime. 06/17/18  Yes Biagio Borg, MD  DULoxetine (CYMBALTA) 30 MG capsule Take 1 capsule (30 mg total) by mouth daily. 03/15/19  Yes Biagio Borg, MD  LORazepam (ATIVAN) 1 MG tablet Take 1 tablet by mouth twice daily as needed for anxiety 03/03/19  Yes Biagio Borg, MD  oxyCODONE-acetaminophen (PERCOCET/ROXICET) 5-325 MG tablet Take 1 tablet by mouth daily as needed for severe pain. 02/17/19  Yes Biagio Borg, MD  sucralfate (CARAFATE) 1 g tablet Take 1 tablet (1 g total) by mouth 4 (four) times daily -  with meals and at bedtime. 02/08/19  Yes Biagio Borg, MD  tamsulosin (FLOMAX) 0.4 MG CAPS capsule Take 1 capsule (0.4 mg total) by mouth daily. 12/08/18  Yes Biagio Borg, MD  pantoprazole (PROTONIX) 40 MG tablet Take 1 tablet (40 mg total) by mouth 2 (two) times daily before a meal. Patient not taking: Reported on 03/23/2019 02/08/19   Biagio Borg, MD  pravastatin (PRAVACHOL) 80 MG tablet Take 1 tablet (80 mg total) by mouth daily. Patient not taking: Reported on 03/23/2019 01/17/19   Biagio Borg, MD  rosuvastatin (CRESTOR) 20 MG tablet Take 1 tablet (20 mg total) by mouth daily. Patient not taking: Reported on 03/23/2019 01/17/19   Biagio Borg, MD  testosterone (ANDROGEL) 50 MG/5GM (1%) GEL Place 5 g onto the skin daily. Patient not taking: Reported on 03/23/2019 02/03/19   Biagio Borg, MD  triamcinolone (NASACORT) 55 MCG/ACT AERO nasal inhaler Place 2 sprays into the nose daily. Patient not taking: Reported on 03/23/2019 01/11/19   Biagio Borg, MD  potassium chloride (KLOR-CON 10) 10 MEQ CR tablet Take 1 tablet (10 mEq total) by mouth daily. 08/28/11 08/23/12  Biagio Borg, MD     Physical Exam: Vitals:   03/23/19 0330 03/23/19 0530 03/23/19 0545 03/23/19 0606  BP: (!) 142/82 132/79 127/77 138/87  Pulse: 69 62  67  Resp: 20 18 16 18   Temp:    98 F (36.7 C)  TempSrc:    Oral  SpO2: 97% 97% 96% 100%    Vitals:   03/23/19 0330 03/23/19 0530 03/23/19 0545 03/23/19 0606  BP: (!) 142/82 132/79 127/77 138/87  Pulse: 69 62  67  Resp: 20 18 16 18   Temp:    98 F (36.7 C)  TempSrc:    Oral  SpO2: 97% 97% 96% 100%   General: deconditioned and ill looking appearing.  Neurology: Awake and alert, non focal Head and Neck. Head normocephalic. Neck supple with no adenopathy or thyromegaly.   E ENT: mild pallor, no icterus, oral mucosa  moist Cardiovascular: No JVD. S1-S2 present, rhythmic, no gallops, rubs, or murmurs. No lower extremity edema. Pulmonary: positive breath sounds bilaterally, adequate air movement, no wheezing, rhonchi or rales. Gastrointestinal. Abdomen protuberant with no organomegaly, non tender, no rebound or guarding.  Skin. No rashes Musculoskeletal: no joint deformities    Labs on Admission: I have personally reviewed following labs and imaging studies  CBC: Recent Labs  Lab 03/22/19 2328 03/23/19 0057  WBC 6.5  --   NEUTROABS 4.0  --   HGB 15.3 14.6  HCT 44.4 43.0  MCV 80.4  --   PLT 248  --    Basic Metabolic Panel: Recent Labs  Lab 03/22/19 2328 03/23/19 0057  NA 137 138  K 3.0* 3.6  CL 100  --   CO2 21*  --   GLUCOSE 100*  --   BUN 8  --   CREATININE 1.84*  --   CALCIUM 9.5  --    GFR: Estimated Creatinine Clearance: 56.7 mL/min (A) (by C-G formula based on SCr of 1.84 mg/dL (H)). Liver Function Tests: Recent Labs  Lab 03/22/19 2328  AST 19  ALT 22  ALKPHOS 43  BILITOT 1.3*  PROT 7.0  ALBUMIN 4.2   Recent Labs  Lab 03/22/19 2328  LIPASE 73*   No results for input(s): AMMONIA in the last 168 hours. Coagulation Profile: No results for input(s): INR, PROTIME in the last 168 hours. Cardiac  Enzymes: No results for input(s): CKTOTAL, CKMB, CKMBINDEX, TROPONINI in the last 168 hours. BNP (last 3 results) No results for input(s): PROBNP in the last 8760 hours. HbA1C: No results for input(s): HGBA1C in the last 72 hours. CBG: Recent Labs  Lab 03/22/19 2302  GLUCAP 106*   Lipid Profile: No results for input(s): CHOL, HDL, LDLCALC, TRIG, CHOLHDL, LDLDIRECT in the last 72 hours. Thyroid Function Tests: No results for input(s): TSH, T4TOTAL, FREET4, T3FREE, THYROIDAB in the last 72 hours. Anemia Panel: No results for input(s): VITAMINB12, FOLATE, FERRITIN, TIBC, IRON, RETICCTPCT in the last 72 hours. Urine analysis:    Component Value Date/Time   COLORURINE YELLOW 03/23/2019 0150   APPEARANCEUR HAZY (A) 03/23/2019 0150   LABSPEC 1.006 03/23/2019 0150   PHURINE 6.0 03/23/2019 0150   GLUCOSEU NEGATIVE 03/23/2019 0150   GLUCOSEU NEGATIVE 01/12/2019 1411   HGBUR SMALL (A) 03/23/2019 0150   BILIRUBINUR NEGATIVE 03/23/2019 0150   KETONESUR 20 (A) 03/23/2019 0150   PROTEINUR NEGATIVE 03/23/2019 0150   UROBILINOGEN 0.2 01/12/2019 1411   NITRITE NEGATIVE 03/23/2019 0150   LEUKOCYTESUR MODERATE (A) 03/23/2019 0150    Radiological Exams on Admission: Ct Abdomen Pelvis Wo Contrast  Result Date: 03/23/2019 CLINICAL DATA:  Pancreatitis EXAM: CT ABDOMEN AND PELVIS WITHOUT CONTRAST TECHNIQUE: Multidetector CT imaging of the abdomen and pelvis was performed following the standard protocol without IV contrast. COMPARISON:  02/05/2019 FINDINGS: Lower chest:  No contributory findings. Hepatobiliary: No focal liver abnormality.No evidence of biliary obstruction or stone. Pancreas: Unremarkable. Spleen: Unremarkable. Adrenals/Urinary Tract: 12 mm right adrenal nodule, stable and again most likely an adenoma. No hydronephrosis or stone. Chronic circumferentially thick walled bladder with diverticula. There is history of BPH and urethral dilatation. Stomach/Bowel: No obstruction. Appendectomy  with small residual. Mild left colonic diverticulosis. Tiny low densities in the lower pole right kidney too small for densitometry. Left renal cystic density. Vascular/Lymphatic: No acute vascular abnormality. Atherosclerotic calcifications. No mass or adenopathy. Reproductive:Nonspecific prostatic calcification. Seminal vesicles displaced to the right by bladder diverticulum. Other: No ascites or pneumoperitoneum.  Small fatty umbilical hernia Musculoskeletal: No acute abnormalities. IMPRESSION: 1. No visible pancreatitis or biliary calculus. 2. Chronic urinary outlet obstruction with bladder diverticula. 3. Stable from CT last month. Electronically Signed   By: Marnee SpringJonathon  Watts M.D.   On: 03/23/2019 04:11    EKG: Independently reviewed.  80 bpm, left axis deviation, normal intervals, sinus rhythm with no significant ST segment or T wave changes.  Assessment/Plan Principal Problem:   Hypotension Active Problems:   Hyperlipidemia   Anxiety state   Essential hypertension   Abdominal pain   AKI (acute kidney injury) [facility](HCC)   Pancreatitis  53 year old male who has history of depression, anxiety and hypertension who presents with abdominal pain for the last 6 weeks which has been worsening.  It tends to be postprandial, it has features of peptic ulcer disease and reflux but his work-up as an outpatient has been negative.  He presents due to worsening symptoms for the last 3 days which has lead to a significant decrease in p.o. intake.  On his initial physical examination his temperature was 98.2, blood pressure 81/69, heart rate 108, respiratory rate 18, oxygen saturation 100% on room air.  Dry mucous membranes, lungs clear to auscultation bilaterally, heart S1-S2 present and rhythmic, the abdomen is protuberant but nontender, no lower extremity edema.  Sodium 137, potassium 3.0, chloride 100, bicarb 21, glucose 100, BUN 8, creatinine 1.84, lipase 73, AST 19, ALT 22, white count 6.5, hemoglobin 15.3,  hematocrit 44.4, platelets 248.  SARS COVID-19 is negative.  Urinalysis 6-10 white cells, 6-10 red cells.  CT of the abdomen with no visible pancreatitis or biliary calculus.  Chronic urinary outlet obstruction with bladder diverticulum.  Patient was admitted to the hospital with a working diagnosis of hypovolemic hypotension complicated by acute kidney injury.  1.  Hypovolemic hypotension.  Patient has received isotonic saline, his blood pressure has responded appropriately, now 138/87 with a heart rate of 67.  Will continue hydration with dextrose and normal saline at 75 ml per hour.  Will continue to hold on antihypertensive medications, patient at home on amlodipine and benazepril.  2.  Acute kidney injury with hypokalemia.  Pre-renal renal failure, continue hydration with isotonic saline, repeat potassium this am is 3.6, will continue potassium correction with potassium chloride, follow-up kidney function in the morning, avoid hypotension nephrotoxic agents.  3.  Mild interstitial pancreatitis.  His lipase is mildly elevated, transaminases are normal, imaging negative for pancreatic inflammation.  Continue pain control with acetaminophen and advance diet to clear liquids.  So far study has been negative for cholecystitis or biliary stones.  Dr. Elnoria HowardHung was considering biliary spasms as a possible etiology of patient's symptoms. Will add a trial of bentyl.   4.  Anxiety.  Patient will continue lorazepam, duloxetine per his home regimen.  5.  Dyslipidemia.  Patient has stopped pravastatin as outpatient.  6.  BPH.  Continue tamsulosin.  DVT prophylaxis: enoxaparin  Code Status:  full  Family Communication: no family at the bedside   Disposition Plan: medical ward  Consults called: none   Admission status: Observation.     Cian Costanzo Annett Gulaaniel Humberto Addo MD [facility]   03/23/2019, 7:37 AM

## 2019-03-23 NOTE — Progress Notes (Signed)
Patient admitted to 6N02.  Alert and oriented x4. Denies pain at this time. Vital signs  WNL. Oriented to room ad call bell.Marland Kitchen

## 2019-03-23 NOTE — Progress Notes (Signed)
BP 159/99, HR 81. Pt concerned that he is not taking BP meds. Dr. Cathlean Sauer notified. Notes that pt was hypotensive on admission and plan is to restart BP meds 7/23. Pt aware.

## 2019-03-23 NOTE — Progress Notes (Signed)
Paged Dr Blaine Hamper for admission orders. Will let day shift MD put admission orders.Marland Kitchen

## 2019-03-23 NOTE — ED Notes (Signed)
Patient transported to CT 

## 2019-03-23 NOTE — Progress Notes (Signed)
Pt refusing Carafate today 7/22.

## 2019-03-23 NOTE — ED Provider Notes (Signed)
[facility] Provider Note   CSN: [id number] Arrival date & time: 03/22/19  2213    History   Chief Complaint Chief Complaint  Patient presents with  . Abdominal Pain  . Diarrhea    HPI Darren Black is a 53 y.o. male.   The history is provided by the patient.  Abdominal Pain Associated symptoms: diarrhea   Diarrhea Associated symptoms: abdominal pain   He has history of hypertension, hyperlipidemia, chronic kidney disease stage III and comes in because of ongoing problems with abdominal pain, diarrhea, weight loss.  He has been having problems with generalized abdominal pain for approximately the last 6 weeks.  Pain is worse after eating, and and he has had approximately 40 pound weight loss because he is afraid to eat.  Over the last week, he has developed diarrhea which occurs after eating almost anything.  Diarrhea has been watery but without blood or mucus.  He denies fever chills or sweats.  Abdominal pain will rated at 10/10 after eating.  He had been seen in the emergency department and had right upper quadrant ultrasound showing no gallstones, CT of abdomen and pelvis which was negative.  He has seen his gastroenterologist who did an endoscopy and found no evidence of H. pylori.  He had a nuclear medicine gallbladder study and his gastroenterologist was going to refer him to a surgeon for possible biliary spasm.  He has been taking sucralfate without any benefit.  And when he came into the hospital, he was feeling dizzy and had a near syncopal episode.  Past Medical History:  Diagnosis Date  . Anxiety   . BPH (benign prostatic hypertrophy)   . Chronic lower back pain    nerve damage w/ leg pain  . Depression   . Family history of adverse reaction to anesthesia    MOTHER--- HARD TO WAKE  . History of panic attacks   . Hyperlipidemia   . Hypertension   . Meatal stenosis   . OSA on CPAP    moderate to severe OSA  per study  04-28-2007 uses CPAP  . PONV (postoperative nausea and vomiting)     Patient Active Problem List   Diagnosis Date Noted  . Weight loss 03/15/2019  . Epigastric pain 02/08/2019  . Hyperglycemia 07/28/2017  . Increased prostate specific antigen (PSA) velocity 07/08/2016  . Urinary retention 07/04/2015  . CKD (chronic kidney disease) stage 3, GFR 30-59 ml/min (HCC) 01/03/2014  . Chronic lower back pain 10/04/2013  . Right lumbar radiculopathy 01/31/2013  . Right shoulder pain 01/07/2012  . Knee abrasion 11/08/2011  . Morbid obesity (HCC) 08/28/2011  . Preventative health care 04/22/2011  . Hyperlipidemia 10/09/2009  . FATIGUE 07/13/2009  . HYPOPITUITARISM 05/15/2009  . HYPOGONADISM 04/20/2009  . LIBIDO, DECREASED 04/11/2009  . PERIPHERAL EDEMA 02/08/2009  . SKIN LESION 04/12/2008  . HSV 01/11/2008  . ORAL ULCER 01/11/2008  . Allergic rhinitis 07/13/2007  . Obstructive sleep apnea 05/29/2007  . Anxiety state 05/28/2007  . DEPRESSION 05/28/2007  . Essential hypertension 05/28/2007  . URINARY TRACT INFECTION, HX OF 05/28/2007    Past Surgical History:  Procedure Laterality Date  . APPENDECTOMY  2003  . BREATH TEK H PYLORI  04/27/2012   Procedure: BREATH TEK H PYLORI;  Surgeon: Valarie MerinoMatthew B Martin, MD;  [facility];  Service: General;  Laterality: N/A;  . CYSTOSCOPY WITH URETHRAL DILATATION N/A 04/12/2015   Procedure: CYSTOSCOPY WITH URETHRAL DILATATION, RETROGRADE AND URETHROGRAM WITH BLADDER BIOPSY;  Surgeon: Bjorn PippinJohn Wrenn, MD;  [facility];  Service: Urology;  Laterality: N/A;  . ROTATOR CUFF REPAIR Right 01-21-2012  . TRANSTHORACIC ECHOCARDIOGRAM  03-26-2007   normal LVSF, ef 60%,  mild AV calcification without stenosis,  mild MR and TR,  mild LAE  . URETHROGRAM N/A 04/12/2015   Procedure: URETHROGRAM;  Surgeon: Bjorn PippinJohn Wrenn, MD;  [facility] [place];  Service: Urology;  Laterality: N/A;        Home Medications    Prior to  Admission medications   Medication Sig Start Date End Date Taking? Authorizing Provider  amLODipine (NORVASC) 5 MG tablet Take 1 tablet by mouth once daily 11/02/18   Corwin LevinsJohn, James W, MD  benazepril (LOTENSIN) 40 MG tablet Take 1 tablet (40 mg total) by mouth daily. 07/12/18   Corwin LevinsJohn, James W, MD  doxazosin (CARDURA) 4 MG tablet Take 1 tablet (4 mg total) by mouth at bedtime. 06/17/18   Corwin LevinsJohn, James W, MD  DULoxetine (CYMBALTA) 30 MG capsule Take 1 capsule (30 mg total) by mouth daily. 03/15/19   Corwin LevinsJohn, James W, MD  LORazepam (ATIVAN) 1 MG tablet Take 1 tablet by mouth twice daily as needed for anxiety 03/03/19   Corwin LevinsJohn, James W, MD  Lycopene 10 MG CAPS Take 1 capsule by mouth every evening.    [provider]  Omega 3-6-9 Fatty Acids (OMEGA 3-6-9 COMPLEX PO) Take 1 capsule by mouth daily.     [provider]  oxyCODONE-acetaminophen (PERCOCET/ROXICET) 5-325 MG tablet Take 1 tablet by mouth daily as needed for severe pain. 02/17/19   Corwin LevinsJohn, James W, MD  pantoprazole (PROTONIX) 40 MG tablet Take 1 tablet (40 mg total) by mouth 2 (two) times daily before a meal. 02/08/19   Corwin LevinsJohn, James W, MD  pravastatin (PRAVACHOL) 80 MG tablet Take 1 tablet (80 mg total) by mouth daily. 01/17/19   Corwin LevinsJohn, James W, MD  rosuvastatin (CRESTOR) 20 MG tablet Take 1 tablet (20 mg total) by mouth daily. 01/17/19   Corwin LevinsJohn, James W, MD  sucralfate (CARAFATE) 1 g tablet Take 1 tablet (1 g total) by mouth 4 (four) times daily -  with meals and at bedtime. 02/08/19   Corwin LevinsJohn, James W, MD  tamsulosin (FLOMAX) 0.4 MG CAPS capsule Take 1 capsule (0.4 mg total) by mouth daily. 12/08/18   Corwin LevinsJohn, James W, MD  testosterone (ANDROGEL) 50 MG/5GM (1%) GEL Place 5 g onto the skin daily. 02/03/19   Corwin LevinsJohn, James W, MD  triamcinolone (NASACORT) 55 MCG/ACT AERO nasal inhaler Place 2 sprays into the nose daily. 01/11/19   Corwin LevinsJohn, James W, MD  potassium chloride (KLOR-CON 10) 10 MEQ CR tablet Take 1 tablet (10 mEq total) by mouth daily. 08/28/11 08/23/12  Corwin LevinsJohn,  James W, MD    Family History Family History  Problem Relation Age of Onset  . Coronary artery disease Father   . Cancer Father        anaplastic thyroid cancer  . Stroke Other        1st degree relative  . Diabetes Other        1st degree relative  . Hypertension Other   . Hyperlipidemia Other     Social History Social History   Tobacco Use  . Smoking status: Never Smoker  . Smokeless tobacco: Never Used  Substance Use Topics  . Alcohol use: Yes    Comment: rare  . Drug use: No     Allergies   Crestor [rosuvastatin calcium] and Flomax [  tamsulosin hcl]   Review of Systems Review of Systems  Gastrointestinal: Positive for abdominal pain and diarrhea.  All other systems reviewed and are negative.    Physical Exam Updated Vital Signs BP 98/74   Pulse (!) 108   Temp 98.2 F (36.8 C) (Oral)   Resp 18   SpO2 100%   Physical Exam Vitals signs and nursing note reviewed.    53 year old male, resting comfortably and in no acute distress. Vital signs are significant for elevated heart rate. Oxygen saturation is 100%, which is normal. Head is normocephalic and atraumatic. PERRLA, EOMI. Oropharynx is clear. Neck is nontender and supple without adenopathy or JVD. Back is nontender and there is no CVA tenderness. Lungs are clear without rales, wheezes, or rhonchi. Chest is nontender. Heart has regular rate and rhythm without murmur. Abdomen is soft, flat, with moderate right upper quadrant tenderness.  There is no rebound or guarding.  There is negative Murphy sign.  There are no masses or hepatosplenomegaly and peristalsis is normoactive. Extremities have no cyanosis or edema, full range of motion is present. Skin is warm and dry without rash. Neurologic: Mental status is normal, cranial nerves are intact, there are no motor or sensory deficits.  ED Treatments / Results  Labs (all labs ordered are listed, but only abnormal results are displayed) Labs Reviewed   URINALYSIS, ROUTINE W REFLEX MICROSCOPIC - Abnormal; Notable for the following components:      Result Value   APPearance HAZY (*)    Hgb urine dipstick SMALL (*)    Ketones, ur 20 (*)    Leukocytes,Ua MODERATE (*)    Bacteria, UA RARE (*)    All other components within normal limits  RAPID URINE DRUG SCREEN, HOSP PERFORMED - Abnormal; Notable for the following components:   Benzodiazepines POSITIVE (*)    All other components within normal limits  COMPREHENSIVE METABOLIC PANEL - Abnormal; Notable for the following components:   Potassium 3.0 (*)    CO2 21 (*)    Glucose, Bld 100 (*)    Creatinine, Ser 1.84 (*)    Total Bilirubin 1.3 (*)    GFR calc non Af Amer 41 (*)    GFR calc Af Amer 48 (*)    Anion gap 16 (*)    All other components within normal limits  LIPASE, BLOOD - Abnormal; Notable for the following components:   Lipase 73 (*)    All other components within normal limits  CBG MONITORING, ED - Abnormal; Notable for the following components:   Glucose-Capillary 106 (*)    All other components within normal limits  POCT I-STAT EG7 - Abnormal; Notable for the following components:   pCO2, Ven 37.0 (*)    Acid-base deficit 3.0 (*)    All other components within normal limits  SARS CORONAVIRUS 2 (HOSPITAL ORDER, [place])  CBC WITH DIFFERENTIAL/PLATELET  LACTIC ACID, PLASMA  LACTIC ACID, PLASMA    EKG EKG Interpretation  Date/Time:  Tuesday March 22 2019 22:43:09 EDT Ventricular Rate:  80 PR Interval:  154 QRS Duration: 96 QT Interval:  372 QTC Calculation: 429 R Axis:   -35 Text Interpretation:  Normal sinus rhythm Left axis deviation Abnormal ECG When compared with ECG of 04/12/2015, No significant change was found Confirmed by Delora Fuel ([id number]) on 03/23/2019 12:04:01 AM   Radiology Ct Abdomen Pelvis Wo Contrast  Result Date: 03/23/2019 CLINICAL DATA:  Pancreatitis EXAM: CT ABDOMEN AND PELVIS WITHOUT CONTRAST TECHNIQUE:  Multidetector CT imaging of the abdomen and pelvis was performed following the standard protocol without IV contrast. COMPARISON:  02/05/2019 FINDINGS: Lower chest:  No contributory findings. Hepatobiliary: No focal liver abnormality.No evidence of biliary obstruction or stone. Pancreas: Unremarkable. Spleen: Unremarkable. Adrenals/Urinary Tract: 12 mm right adrenal nodule, stable and again most likely an adenoma. No hydronephrosis or stone. Chronic circumferentially thick walled bladder with diverticula. There is history of BPH and urethral dilatation. Stomach/Bowel: No obstruction. Appendectomy with small residual. Mild left colonic diverticulosis. Tiny low densities in the lower pole right kidney too small for densitometry. Left renal cystic density. Vascular/Lymphatic: No acute vascular abnormality. Atherosclerotic calcifications. No mass or adenopathy. Reproductive:Nonspecific prostatic calcification. Seminal vesicles displaced to the right by bladder diverticulum. Other: No ascites or pneumoperitoneum.  Small fatty umbilical hernia Musculoskeletal: No acute abnormalities. IMPRESSION: 1. No visible pancreatitis or biliary calculus. 2. Chronic urinary outlet obstruction with bladder diverticula. 3. Stable from CT last month. Electronically Signed   By: Marnee SpringJonathon  Watts M.D.   On: 03/23/2019 04:11    Procedures Procedures  Medications Ordered in ED Medications  potassium chloride SA (K-DUR) CR tablet 40 mEq (has no administration in time range)  morphine 2 MG/ML injection 2 mg (has no administration in time range)  ondansetron (ZOFRAN) injection 4 mg (has no administration in time range)  sodium chloride 0.9 % bolus 1,000 mL (has no administration in time range)  0.9 %  sodium chloride infusion (has no administration in time range)  sodium chloride 0.9 % bolus 1,000 mL (0 mLs Intravenous Stopped 03/23/19 0452)  potassium chloride 10 mEq in 100 mL IVPB (0 mEq Intravenous Stopped 03/23/19 0311)   ondansetron (ZOFRAN) injection 4 mg (4 mg Intravenous Given 03/23/19 0207)  morphine 4 MG/ML injection 4 mg (4 mg Intravenous Given 03/23/19 0207)     Initial Impression / Assessment and Plan / ED Course  I have reviewed the triage vital signs and the nursing notes.  Pertinent labs & imaging results that were available during my care of the patient were reviewed by me and considered in my medical decision making (see chart for details).  Persistent postprandial abdominal pain with diarrhea and weight loss.  Old records are reviewed, confirming ED visit on June 6 at which time right upper quadrant ultrasound was suggestive of hepatic steatosis but no evidence of gallstones, CT abdomen and pelvis showing no acute intra-abdominal process.  On 7/6, he had an another right upper quadrant ultrasound which also showed no evidence of gallstones, and HIDA scan which showed patent biliary tree and normal gallbladder ejection fraction.  Patient states that severe pain when he drank Ensure which was part of the HIDA scan.  Labs today are significant for worsening renal function and mild elevation of lipase as well as hypokalemia.  He will be given IV fluids, morphine, ondansetron, potassium and will be sent for repeat CT of abdomen and pelvis.  CT scan shows no significant change from prior scan.  He was unable to tolerate the oral contrast.  Case is discussed with Dr. Clyde LundborgNiu of [facility], who agrees to admit the patient.  Final Clinical Impressions(s) / ED Diagnoses   Final diagnoses:  Generalized abdominal pain  Acute kidney injury (nontraumatic) (HCC)  Elevated lipase  Diarrhea, unspecified type  Weight loss  Hypokalemia    ED Discharge Orders    None       Dione BoozeGlick, Leaner Morici, MD 03/23/19 0500

## 2019-03-23 NOTE — ED Notes (Signed)
ED TO INPATIENT HANDOFF REPORT  ED Nurse Name and Phone #: Tray SwazilandJordan, [phone]  S Name/Age/Gender Darren ShearerJohn Black 53 y.o. male Room/Bed: 028C/028C  Code Status   Code Status: Not on file  Home/SNF/Other Home Patient oriented to: self, place, time and situation Is this baseline? Yes   Triage Complete: Triage complete  Chief Complaint Diarrhea  Triage Note Pt here for abd pain, pt lethargic, pale and diaphoretic in triage. Pt slow to respond to questions. Pt states "I feel tingly all over". BP soft in triage   Allergies Allergies  Allergen Reactions  . Crestor [Rosuvastatin Calcium] Other (See Comments)    abd pain  . Flomax [Tamsulosin Hcl] Rash    Level of Care/Admitting Diagnosis ED Disposition    ED Disposition Condition Comment   Admit  [facility] [[id number]]  Level of Care: Med-Surg [16]  I expect the patient will be discharged within 24 hours: No (not a candidate for 5C-Observation unit)  Covid Evaluation: Asymptomatic Screening Protocol (No Symptoms)  Diagnosis: Abdominal pain [191478][[id number]]  Admitting Physician: Lorretta HarpNIU, XILIN [[id number]]  Attending Physician: Lorretta HarpNIU, XILIN [[id number]]  PT Class (Do Not Modify): Observation [104]  PT Acc Code (Do Not Modify): Observation [10022]       B Medical/Surgery History Past Medical History:  Diagnosis Date  . Anxiety   . BPH (benign prostatic hypertrophy)   . Chronic lower back pain    nerve damage w/ leg pain  . Depression   . Family history of adverse reaction to anesthesia    MOTHER--- HARD TO WAKE  . History of panic attacks   . Hyperlipidemia   . Hypertension   . Meatal stenosis   . OSA on CPAP    moderate to severe OSA  per study 04-28-2007 uses CPAP  . PONV (postoperative nausea and vomiting)    Past Surgical History:  Procedure Laterality Date  . APPENDECTOMY  2003  . BREATH TEK H PYLORI  04/27/2012   Procedure: BREATH TEK H PYLORI;  Surgeon: Valarie MerinoMatthew B Martin, MD;  [facility];  Service: General;  Laterality: N/A;  . CYSTOSCOPY WITH URETHRAL DILATATION N/A 04/12/2015   Procedure: CYSTOSCOPY WITH URETHRAL DILATATION, RETROGRADE AND URETHROGRAM WITH BLADDER BIOPSY;  Surgeon: Bjorn PippinJohn Wrenn, MD;  [facility];  Service: Urology;  Laterality: N/A;  . ROTATOR CUFF REPAIR Right 01-21-2012  . TRANSTHORACIC ECHOCARDIOGRAM  03-26-2007   normal LVSF, ef 60%,  mild AV calcification without stenosis,  mild MR and TR,  mild LAE  . URETHROGRAM N/A 04/12/2015   Procedure: URETHROGRAM;  Surgeon: Bjorn PippinJohn Wrenn, MD;  [facility];  Service: Urology;  Laterality: N/A;     A IV Location/Drains/Wounds Patient Lines/Drains/Airways Status   Active Line/Drains/Airways    Name:   Placement date:   Placement time:   Site:   Days:   Peripheral IV 03/23/19 Right Antecubital   03/23/19    0205    Antecubital   less than 1   Incision (Closed) 04/12/15 Penis   04/12/15    0725     1441          Intake/Output Last 24 hours  Intake/Output Summary (Last 24 hours) at 03/23/2019 0527 Last data filed at 03/23/2019 0452 Gross per 24 hour  Intake 1095.93 ml  Output -  Net 1095.93 ml    Labs/Imaging Results for orders placed or performed during the hospital encounter of 03/22/19 (from the past 48 hour(s))  CBG monitoring,  ED     Status: Abnormal   Collection Time: 03/22/19 11:02 PM  Result Value Ref Range   Glucose-Capillary 106 (H) 70 - 99 mg/dL  Comprehensive metabolic panel     Status: Abnormal   Collection Time: 03/22/19 11:28 PM  Result Value Ref Range   Sodium 137 135 - 145 mmol/L   Potassium 3.0 (L) 3.5 - 5.1 mmol/L   Chloride 100 98 - 111 mmol/L   CO2 21 (L) 22 - 32 mmol/L   Glucose, Bld 100 (H) 70 - 99 mg/dL   BUN 8 6 - 20 mg/dL   Creatinine, Ser 4.091.84 (H) 0.61 - 1.24 mg/dL   Calcium 9.5 8.9 - 81.110.3 mg/dL   Total Protein 7.0 6.5 - 8.1 g/dL   Albumin 4.2 3.5 - 5.0 g/dL   AST 19 15 - 41 U/L   ALT 22 0 - 44 U/L   Alkaline  Phosphatase 43 38 - 126 U/L   Total Bilirubin 1.3 (H) 0.3 - 1.2 mg/dL   GFR calc non Af Amer 41 (L) >60 mL/min   GFR calc Af Amer 48 (L) >60 mL/min   Anion gap 16 (H) 5 - 15    Comment: Performed at [facility], [address] [place], [place] [address]  CBC with Differential     Status: None   Collection Time: 03/22/19 11:28 PM  Result Value Ref Range   WBC 6.5 4.0 - 10.5 K/uL   RBC 5.52 4.22 - 5.81 MIL/uL   Hemoglobin 15.3 13.0 - 17.0 g/dL   HCT 29.544.4 62.139.0 - 30.852.0 %   MCV 80.4 80.0 - 100.0 fL   MCH 27.7 26.0 - 34.0 pg   MCHC 34.5 30.0 - 36.0 g/dL   RDW 65.712.8 84.611.5 - 96.215.5 %   Platelets 248 150 - 400 K/uL   nRBC 0.0 0.0 - 0.2 %   Neutrophils Relative % 61 %   Neutro Abs 4.0 1.7 - 7.7 K/uL   Lymphocytes Relative 28 %   Lymphs Abs 1.8 0.7 - 4.0 K/uL   Monocytes Relative 7 %   Monocytes Absolute 0.5 0.1 - 1.0 K/uL   Eosinophils Relative 3 %   Eosinophils Absolute 0.2 0.0 - 0.5 K/uL   Basophils Relative 1 %   Basophils Absolute 0.1 0.0 - 0.1 K/uL   Immature Granulocytes 0 %   Abs Immature Granulocytes 0.02 0.00 - 0.07 K/uL    Comment: Performed at [facility], [address] [place], [place] [address]  Lipase, blood     Status: Abnormal   Collection Time: 03/22/19 11:28 PM  Result Value Ref Range   Lipase 73 (H) 11 - 51 U/L    Comment: Performed at [facility], [address] [place], [place] [address]  Lactic acid, plasma     Status: None   Collection Time: 03/22/19 11:29 PM  Result Value Ref Range   Lactic Acid, Venous 1.5 0.5 - 1.9 mmol/L    Comment: Performed at [facility], [address] [place], [place] [address]  POCT I-Stat EG7     Status: Abnormal   Collection Time: 03/23/19 12:57 AM  Result Value Ref Range   pH, Ven 7.372 7.250 - 7.430   pCO2, Ven 37.0 (L) 44.0 - 60.0 mmHg   pO2, Ven 35.0 32.0 - 45.0 mmHg   Bicarbonate 21.5 20.0 - 28.0 mmol/L   TCO2 23 22 - 32 mmol/L   O2 Saturation 66.0 %  Acid-base deficit 3.0 (H) 0.0 - 2.0  mmol/L   Sodium 138 135 - 145 mmol/L   Potassium 3.6 3.5 - 5.1 mmol/L   Calcium, Ion 1.18 1.15 - 1.40 mmol/L   HCT 43.0 39.0 - 52.0 %   Hemoglobin 14.6 13.0 - 17.0 g/dL   Patient temperature HIDE    Sample type VENOUS    Comment NOTIFIED PHYSICIAN   Lactic acid, plasma     Status: None   Collection Time: 03/23/19  1:10 AM  Result Value Ref Range   Lactic Acid, Venous 1.8 0.5 - 1.9 mmol/L    Comment: Performed at [facility] [address] [place], [place] [address]  Urinalysis, Routine w reflex microscopic     Status: Abnormal   Collection Time: 03/23/19  1:50 AM  Result Value Ref Range   Color, Urine YELLOW YELLOW   APPearance HAZY (A) CLEAR   Specific Gravity, Urine 1.006 1.005 - 1.030   pH 6.0 5.0 - 8.0   Glucose, UA NEGATIVE NEGATIVE mg/dL   Hgb urine dipstick SMALL (A) NEGATIVE   Bilirubin Urine NEGATIVE NEGATIVE   Ketones, ur 20 (A) NEGATIVE mg/dL   Protein, ur NEGATIVE NEGATIVE mg/dL   Nitrite NEGATIVE NEGATIVE   Leukocytes,Ua MODERATE (A) NEGATIVE   RBC / HPF 6-10 0 - 5 RBC/hpf   WBC, UA 6-10 0 - 5 WBC/hpf   Bacteria, UA RARE (A) NONE SEEN   Squamous Epithelial / LPF 0-5 0 - 5    Comment: Performed at [facility], [place] [address], [place], [place] [address]  Urine rapid drug screen (hosp performed)     Status: Abnormal   Collection Time: 03/23/19  1:50 AM  Result Value Ref Range   Opiates NONE DETECTED NONE DETECTED   Cocaine NONE DETECTED NONE DETECTED   Benzodiazepines POSITIVE (A) NONE DETECTED   Amphetamines NONE DETECTED NONE DETECTED   Tetrahydrocannabinol NONE DETECTED NONE DETECTED   Barbiturates NONE DETECTED NONE DETECTED    Comment: (NOTE) DRUG SCREEN FOR MEDICAL PURPOSES ONLY.  IF CONFIRMATION IS NEEDED FOR ANY PURPOSE, NOTIFY LAB WITHIN 5 DAYS. LOWEST DETECTABLE LIMITS FOR URINE DRUG SCREEN Drug Class                     Cutoff (ng/mL) Amphetamine and metabolites    1000 Barbiturate and metabolites    200 Benzodiazepine                  353 Tricyclics and metabolites     300 Opiates and metabolites        300 Cocaine and metabolites        300 THC                            50 Performed at [facility], [place] [address], [place],  [address]    Ct Abdomen Pelvis Wo Contrast  Result Date: 03/23/2019 CLINICAL DATA:  Pancreatitis EXAM: CT ABDOMEN AND PELVIS WITHOUT CONTRAST TECHNIQUE: Multidetector CT imaging of the abdomen and pelvis was performed following the standard protocol without IV contrast. COMPARISON:  02/05/2019 FINDINGS: Lower chest:  No contributory findings. Hepatobiliary: No focal liver abnormality.No evidence of biliary obstruction or stone. Pancreas: Unremarkable. Spleen: Unremarkable. Adrenals/Urinary Tract: 12 mm right adrenal nodule, stable and again most likely an adenoma. No hydronephrosis or stone. Chronic circumferentially thick walled bladder with diverticula. There is history of BPH and urethral dilatation. Stomach/Bowel:  No obstruction. Appendectomy with small residual. Mild left colonic diverticulosis. Tiny low densities in the lower pole right kidney too small for densitometry. Left renal cystic density. Vascular/Lymphatic: No acute vascular abnormality. Atherosclerotic calcifications. No mass or adenopathy. Reproductive:Nonspecific prostatic calcification. Seminal vesicles displaced to the right by bladder diverticulum. Other: No ascites or pneumoperitoneum.  Small fatty umbilical hernia Musculoskeletal: No acute abnormalities. IMPRESSION: 1. No visible pancreatitis or biliary calculus. 2. Chronic urinary outlet obstruction with bladder diverticula. 3. Stable from CT last month. Electronically Signed   By: Marnee SpringJonathon  Watts M.D.   On: 03/23/2019 04:11    Pending Labs Unresulted Labs (From admission, onward)    Start     Ordered   03/23/19 0433  SARS Coronavirus 2 (CEPHEID - Performed in [facility] lab), [facility]  Once,   STAT    Question:  Rule Out  Answer:  Yes   03/23/19  0432          Vitals/Pain Today's Vitals   03/23/19 0245 03/23/19 0300 03/23/19 0315 03/23/19 0330  BP: 126/77 133/78 (!) 144/77 (!) 142/82  Pulse: 65 76 77 69  Resp: 19 (!) 23 20 20   Temp:      TempSrc:      SpO2: 96% 95% 96% 97%    Isolation Precautions No active isolations  Medications Medications  morphine 2 MG/ML injection 2 mg (has no administration in time range)  ondansetron (ZOFRAN) injection 4 mg (has no administration in time range)  0.9 %  sodium chloride infusion (has no administration in time range)  sodium chloride 0.9 % bolus 1,000 mL (0 mLs Intravenous Stopped 03/23/19 0452)  potassium chloride 10 mEq in 100 mL IVPB (0 mEq Intravenous Stopped 03/23/19 0311)  ondansetron (ZOFRAN) injection 4 mg (4 mg Intravenous Given 03/23/19 0207)  morphine 4 MG/ML injection 4 mg (4 mg Intravenous Given 03/23/19 0207)  potassium chloride SA (K-DUR) CR tablet 40 mEq (40 mEq Oral Given 03/23/19 0512)  sodium chloride 0.9 % bolus 1,000 mL (1,000 mLs Intravenous New Bag/Given 03/23/19 0515)    Mobility walks Low fall risk   Focused Assessments GI   R Recommendations: See Admitting Provider Note  Report given to:   Additional Notes:

## 2019-03-23 NOTE — ED Notes (Signed)
Pt stated "I cannot drink the oral contrast because it will give me the runs". MD informed.

## 2019-03-23 NOTE — Progress Notes (Signed)
CPAP set up at bedside. Auto settings max 12.0 - min 5.0) per patient. Nasal mask.  Patient tolerating at this time. Patient states he can place himself on/off as needed. Patient aware to call for RN or RT if assistance needed.

## 2019-03-23 NOTE — Telephone Encounter (Signed)
[name] wife stated that the pt is currently in the ED now. She stated was admitted last night.

## 2019-03-24 ENCOUNTER — Inpatient Hospital Stay (HOSPITAL_COMMUNITY): Payer: Medicare HMO

## 2019-03-24 ENCOUNTER — Encounter (HOSPITAL_COMMUNITY): Payer: Self-pay | Admitting: *Deleted

## 2019-03-24 ENCOUNTER — Inpatient Hospital Stay (HOSPITAL_COMMUNITY): Payer: Medicare HMO | Admitting: Certified Registered Nurse Anesthetist

## 2019-03-24 ENCOUNTER — Encounter (HOSPITAL_COMMUNITY)
Admission: EM | Disposition: A | Discharge status: Home / Self Care | Payer: Self-pay | Source: Home / Self Care | Attending: Internal Medicine

## 2019-03-24 DIAGNOSIS — N189 Chronic kidney disease, unspecified: Secondary | ICD-10-CM

## 2019-03-24 DIAGNOSIS — I10 Essential (primary) hypertension: Secondary | ICD-10-CM

## 2019-03-24 DIAGNOSIS — E876 Hypokalemia: Secondary | ICD-10-CM

## 2019-03-24 DIAGNOSIS — R55 Syncope and collapse: Secondary | ICD-10-CM

## 2019-03-24 DIAGNOSIS — I959 Hypotension, unspecified: Secondary | ICD-10-CM

## 2019-03-24 DIAGNOSIS — R197 Diarrhea, unspecified: Secondary | ICD-10-CM

## 2019-03-24 DIAGNOSIS — E860 Dehydration: Secondary | ICD-10-CM

## 2019-03-24 HISTORY — PX: CHOLECYSTECTOMY: SHX55

## 2019-03-24 LAB — HIV ANTIBODY (ROUTINE TESTING W REFLEX): HIV Screen 4th Generation wRfx: NONREACTIVE

## 2019-03-24 LAB — CBC
HCT: 35.4 % — ABNORMAL LOW (ref 39.0–52.0)
Hemoglobin: 12 g/dL — ABNORMAL LOW (ref 13.0–17.0)
MCH: 27.7 pg (ref 26.0–34.0)
MCHC: 33.9 g/dL (ref 30.0–36.0)
MCV: 81.8 fL (ref 80.0–100.0)
Platelets: 168 10*3/uL (ref 150–400)
RBC: 4.33 MIL/uL (ref 4.22–5.81)
RDW: 13.2 % (ref 11.5–15.5)
WBC: 4.6 10*3/uL (ref 4.0–10.5)
nRBC: 0 % (ref 0.0–0.2)

## 2019-03-24 LAB — SURGICAL PCR SCREEN
MRSA, PCR: NEGATIVE
Staphylococcus aureus: NEGATIVE

## 2019-03-24 LAB — BASIC METABOLIC PANEL
Anion gap: 8 (ref 5–15)
BUN: 6 mg/dL (ref 6–20)
CO2: 22 mmol/L (ref 22–32)
Calcium: 8.5 mg/dL — ABNORMAL LOW (ref 8.9–10.3)
Chloride: 112 mmol/L — ABNORMAL HIGH (ref 98–111)
Creatinine, Ser: 1.45 mg/dL — ABNORMAL HIGH (ref 0.61–1.24)
GFR calc Af Amer: >60 mL/min (ref >60)
GFR calc non Af Amer: 55 mL/min — ABNORMAL LOW (ref >60)
Glucose, Bld: 119 mg/dL — ABNORMAL HIGH (ref 70–99)
Potassium: 4.9 mmol/L (ref 3.5–5.1)
Sodium: 142 mmol/L (ref 135–145)

## 2019-03-24 SURGERY — LAPAROSCOPIC CHOLECYSTECTOMY WITH INTRAOPERATIVE CHOLANGIOGRAM
Anesthesia: General | Site: Abdomen

## 2019-03-24 MED ORDER — OXYCODONE HCL 5 MG PO TABS
ORAL_TABLET | ORAL | Status: AC
  Filled 2019-03-24: qty 1

## 2019-03-24 MED ORDER — ACETAMINOPHEN 500 MG PO TABS
ORAL_TABLET | ORAL | Status: AC
  Administered 2019-03-24: 1000 mg via ORAL
  Filled 2019-03-24: qty 2

## 2019-03-24 MED ORDER — ROCURONIUM BROMIDE 50 MG/5ML IV SOSY
PREFILLED_SYRINGE | INTRAVENOUS | Status: DC | PRN
  Administered 2019-03-24: 50 mg via INTRAVENOUS

## 2019-03-24 MED ORDER — MIDAZOLAM HCL 5 MG/5ML IJ SOLN
INTRAMUSCULAR | Status: DC | PRN
  Administered 2019-03-24: 2 mg via INTRAVENOUS

## 2019-03-24 MED ORDER — SCOPOLAMINE 1 MG/3DAYS TD PT72
MEDICATED_PATCH | TRANSDERMAL | Status: AC
  Administered 2019-03-24: 1.5 mg via TRANSDERMAL
  Filled 2019-03-24: qty 1

## 2019-03-24 MED ORDER — SUGAMMADEX SODIUM 500 MG/5ML IV SOLN
INTRAVENOUS | Status: AC
  Filled 2019-03-24: qty 15

## 2019-03-24 MED ORDER — LACTATED RINGERS IV SOLN
INTRAVENOUS | Status: DC
  Administered 2019-03-24: 15:00:00 via INTRAVENOUS

## 2019-03-24 MED ORDER — OXYCODONE HCL 5 MG/5ML PO SOLN: 5.0000 mg | Freq: Once | ORAL | Status: AC | PRN

## 2019-03-24 MED ORDER — FENTANYL CITRATE (PF) 250 MCG/5ML IJ SOLN
INTRAMUSCULAR | Status: DC | PRN
  Administered 2019-03-24: 100 ug via INTRAVENOUS
  Administered 2019-03-24: 50 ug via INTRAVENOUS

## 2019-03-24 MED ORDER — OXYCODONE HCL 5 MG PO TABS
5.0000 mg | ORAL_TABLET | Freq: Once | ORAL | Status: AC | PRN
  Administered 2019-03-24: 5 mg via ORAL

## 2019-03-24 MED ORDER — SUCCINYLCHOLINE CHLORIDE 200 MG/10ML IV SOSY
PREFILLED_SYRINGE | INTRAVENOUS | Status: AC
  Filled 2019-03-24: qty 10

## 2019-03-24 MED ORDER — LIDOCAINE 2% (20 MG/ML) 5 ML SYRINGE
INTRAMUSCULAR | Status: DC | PRN
  Administered 2019-03-24: 100 mg via INTRAVENOUS

## 2019-03-24 MED ORDER — METOPROLOL TARTRATE 5 MG/5ML IV SOLN: 5.0000 mg | Freq: Four times a day (QID) | INTRAVENOUS | Status: DC | PRN

## 2019-03-24 MED ORDER — ACETAMINOPHEN 500 MG PO TABS
1000.0000 mg | ORAL_TABLET | Freq: Once | ORAL | Status: AC
  Administered 2019-03-24: 1000 mg via ORAL

## 2019-03-24 MED ORDER — OXYCODONE HCL 5 MG PO TABS
5.0000 mg | ORAL_TABLET | ORAL | Status: DC | PRN
  Administered 2019-03-25: 5 mg via ORAL
  Filled 2019-03-24: qty 1

## 2019-03-24 MED ORDER — FENTANYL CITRATE (PF) 250 MCG/5ML IJ SOLN
INTRAMUSCULAR | Status: AC
  Filled 2019-03-24: qty 5

## 2019-03-24 MED ORDER — LACTATED RINGERS IV SOLN
INTRAVENOUS | Status: DC
  Administered 2019-03-24: 1000 mL via INTRAVENOUS

## 2019-03-24 MED ORDER — PROPOFOL 10 MG/ML IV BOLUS
INTRAVENOUS | Status: AC
  Filled 2019-03-24: qty 20

## 2019-03-24 MED ORDER — FENTANYL CITRATE (PF) 100 MCG/2ML IJ SOLN
25.0000 ug | INTRAMUSCULAR | Status: DC | PRN
  Administered 2019-03-24 (×2): 25 ug via INTRAVENOUS
  Administered 2019-03-24: 50 ug via INTRAVENOUS

## 2019-03-24 MED ORDER — PROPOFOL 10 MG/ML IV BOLUS
INTRAVENOUS | Status: DC | PRN
  Administered 2019-03-24: 30 mg via INTRAVENOUS
  Administered 2019-03-24: 170 mg via INTRAVENOUS

## 2019-03-24 MED ORDER — SODIUM CHLORIDE 0.9 % IV SOLN
INTRAVENOUS | Status: DC | PRN
  Administered 2019-03-24: 16:00:00 11 mL

## 2019-03-24 MED ORDER — MIDAZOLAM HCL 2 MG/2ML IJ SOLN
INTRAMUSCULAR | Status: AC
  Filled 2019-03-24: qty 2

## 2019-03-24 MED ORDER — MORPHINE SULFATE (PF) 2 MG/ML IV SOLN: 1.0000 mg | INTRAVENOUS | Status: DC | PRN

## 2019-03-24 MED ORDER — SODIUM CHLORIDE 0.9 % IR SOLN
Status: DC | PRN
  Administered 2019-03-24: 1000 mL

## 2019-03-24 MED ORDER — FENTANYL CITRATE (PF) 100 MCG/2ML IJ SOLN
INTRAMUSCULAR | Status: AC
  Filled 2019-03-24: qty 2

## 2019-03-24 MED ORDER — CEFAZOLIN SODIUM-DEXTROSE 2-4 GM/100ML-% IV SOLN
2.0000 g | Freq: Once | INTRAVENOUS | Status: DC
  Filled 2019-03-24: qty 100

## 2019-03-24 MED ORDER — ROCURONIUM BROMIDE 10 MG/ML (PF) SYRINGE
PREFILLED_SYRINGE | INTRAVENOUS | Status: AC
  Filled 2019-03-24: qty 20

## 2019-03-24 MED ORDER — DEXTROSE-NACL 5-0.9 % IV SOLN
INTRAVENOUS | Status: DC
  Administered 2019-03-24: 20:00:00 via INTRAVENOUS

## 2019-03-24 MED ORDER — PROMETHAZINE HCL 25 MG/ML IJ SOLN: 6.2500 mg | INTRAMUSCULAR | Status: DC | PRN

## 2019-03-24 MED ORDER — SCOPOLAMINE 1 MG/3DAYS TD PT72
1.0000 | MEDICATED_PATCH | TRANSDERMAL | Status: DC
  Administered 2019-03-24: 1.5 mg via TRANSDERMAL

## 2019-03-24 MED ORDER — DEXAMETHASONE SODIUM PHOSPHATE 10 MG/ML IJ SOLN
INTRAMUSCULAR | Status: DC | PRN
  Administered 2019-03-24: 10 mg via INTRAVENOUS

## 2019-03-24 MED ORDER — ONDANSETRON HCL 4 MG/2ML IJ SOLN
INTRAMUSCULAR | Status: AC
  Filled 2019-03-24: qty 4

## 2019-03-24 MED ORDER — ONDANSETRON HCL 4 MG/2ML IJ SOLN
INTRAMUSCULAR | Status: DC | PRN
  Administered 2019-03-24: 4 mg via INTRAVENOUS

## 2019-03-24 MED ORDER — CEFAZOLIN SODIUM-DEXTROSE 2-4 GM/100ML-% IV SOLN
2.0000 g | Freq: Once | INTRAVENOUS | Status: AC
  Administered 2019-03-24: 2 g via INTRAVENOUS
  Filled 2019-03-24 (×2): qty 100

## 2019-03-24 MED ORDER — SUGAMMADEX SODIUM 200 MG/2ML IV SOLN
INTRAVENOUS | Status: DC | PRN
  Administered 2019-03-24: 250 mg via INTRAVENOUS

## 2019-03-24 MED ORDER — BUPIVACAINE-EPINEPHRINE 0.25% -1:200000 IJ SOLN
INTRAMUSCULAR | Status: DC | PRN
  Administered 2019-03-24: 11 mL

## 2019-03-24 MED ORDER — SUCCINYLCHOLINE CHLORIDE 200 MG/10ML IV SOSY
PREFILLED_SYRINGE | INTRAVENOUS | Status: DC | PRN
  Administered 2019-03-24: 120 mg via INTRAVENOUS

## 2019-03-24 MED ORDER — ENOXAPARIN SODIUM 40 MG/0.4ML ~~LOC~~ SOLN: 40.0000 mg | SUBCUTANEOUS | Status: DC

## 2019-03-24 MED ORDER — EPHEDRINE SULFATE-NACL 50-0.9 MG/10ML-% IV SOSY
PREFILLED_SYRINGE | INTRAVENOUS | Status: DC | PRN
  Administered 2019-03-24: 5 mg via INTRAVENOUS

## 2019-03-24 MED ORDER — PHENYLEPHRINE 40 MCG/ML (10ML) SYRINGE FOR IV PUSH (FOR BLOOD PRESSURE SUPPORT)
PREFILLED_SYRINGE | INTRAVENOUS | Status: AC
  Filled 2019-03-24: qty 10

## 2019-03-24 MED ORDER — BUPIVACAINE-EPINEPHRINE (PF) 0.25% -1:200000 IJ SOLN
INTRAMUSCULAR | Status: AC
  Filled 2019-03-24: qty 30

## 2019-03-24 MED ORDER — AMLODIPINE BESYLATE 5 MG PO TABS
5.0000 mg | ORAL_TABLET | Freq: Every day | ORAL | Status: DC
  Administered 2019-03-24 – 2019-03-25 (×2): 5 mg via ORAL
  Filled 2019-03-24 (×2): qty 1

## 2019-03-24 MED ORDER — METOPROLOL TARTRATE 5 MG/5ML IV SOLN
INTRAVENOUS | Status: AC
  Filled 2019-03-24: qty 5

## 2019-03-24 MED ORDER — ACETAMINOPHEN 500 MG PO TABS
1000.0000 mg | ORAL_TABLET | Freq: Four times a day (QID) | ORAL | Status: DC
  Administered 2019-03-24 – 2019-03-25 (×3): 1000 mg via ORAL
  Filled 2019-03-24 (×3): qty 2

## 2019-03-24 MED ORDER — DIPHENHYDRAMINE HCL 50 MG/ML IJ SOLN
INTRAMUSCULAR | Status: DC | PRN
  Administered 2019-03-24: 25 mg via INTRAVENOUS

## 2019-03-24 MED ORDER — DOXAZOSIN MESYLATE 4 MG PO TABS
4.0000 mg | ORAL_TABLET | Freq: Every day | ORAL | Status: DC
  Administered 2019-03-24: 21:00:00 4 mg via ORAL
  Filled 2019-03-24 (×2): qty 1

## 2019-03-24 MED ORDER — 0.9 % SODIUM CHLORIDE (POUR BTL) OPTIME
TOPICAL | Status: DC | PRN
  Administered 2019-03-24: 16:00:00 1000 mL

## 2019-03-24 MED ORDER — DEXAMETHASONE SODIUM PHOSPHATE 10 MG/ML IJ SOLN
INTRAMUSCULAR | Status: AC
  Filled 2019-03-24: qty 2

## 2019-03-24 MED ORDER — STERILE WATER FOR IRRIGATION IR SOLN
Status: DC | PRN
  Administered 2019-03-24: 1000 mL

## 2019-03-24 SURGICAL SUPPLY — 41 items
APPLIER CLIP ROT 10 11.4 M/L (STAPLE) ×2
BENZOIN TINCTURE PRP APPL 2/3 (GAUZE/BANDAGES/DRESSINGS) ×2 IMPLANT
BLADE CLIPPER SURG (BLADE) ×2 IMPLANT
CANISTER SUCT 3000ML PPV (MISCELLANEOUS) ×2 IMPLANT
CHLORAPREP W/TINT 26 (MISCELLANEOUS) ×2 IMPLANT
CLIP APPLIE ROT 10 11.4 M/L (STAPLE) ×1 IMPLANT
COVER MAYO STAND STRL (DRAPES) ×2 IMPLANT
COVER SURGICAL LIGHT HANDLE (MISCELLANEOUS) ×2 IMPLANT
COVER WAND RF STERILE (DRAPES) ×2 IMPLANT
DRAPE C-ARM 42X72 X-RAY (DRAPES) ×2 IMPLANT
DRSG TEGADERM 2-3/8X2-3/4 SM (GAUZE/BANDAGES/DRESSINGS) ×6 IMPLANT
DRSG TEGADERM 4X4.75 (GAUZE/BANDAGES/DRESSINGS) ×2 IMPLANT
ELECT REM PT RETURN 9FT ADLT (ELECTROSURGICAL) ×2
ELECTRODE REM PT RTRN 9FT ADLT (ELECTROSURGICAL) ×1 IMPLANT
GAUZE SPONGE 2X2 8PLY STRL LF (GAUZE/BANDAGES/DRESSINGS) ×1 IMPLANT
GLOVE BIO SURGEON STRL SZ7 (GLOVE) ×2 IMPLANT
GLOVE BIOGEL PI IND STRL 7.5 (GLOVE) ×1 IMPLANT
GLOVE BIOGEL PI INDICATOR 7.5 (GLOVE) ×1
GOWN STRL REUS W/ TWL LRG LVL3 (GOWN DISPOSABLE) ×3 IMPLANT
GOWN STRL REUS W/TWL LRG LVL3 (GOWN DISPOSABLE) ×3
KIT BASIN OR (CUSTOM PROCEDURE TRAY) ×2 IMPLANT
KIT TURNOVER KIT B (KITS) ×2 IMPLANT
NS IRRIG 1000ML POUR BTL (IV SOLUTION) ×2 IMPLANT
PAD ARMBOARD 7.5X6 YLW CONV (MISCELLANEOUS) ×2 IMPLANT
POUCH RETRIEVAL ECOSAC 10 (ENDOMECHANICALS) ×1 IMPLANT
POUCH RETRIEVAL ECOSAC 10MM (ENDOMECHANICALS) ×1
SCISSORS LAP 5X35 DISP (ENDOMECHANICALS) ×2 IMPLANT
SET CHOLANGIOGRAPH 5 50 .035 (SET/KITS/TRAYS/PACK) ×2 IMPLANT
SET IRRIG TUBING LAPAROSCOPIC (IRRIGATION / IRRIGATOR) ×2 IMPLANT
SET TUBE SMOKE EVAC HIGH FLOW (TUBING) ×2 IMPLANT
SLEEVE ENDOPATH XCEL 5M (ENDOMECHANICALS) ×2 IMPLANT
SPECIMEN JAR SMALL (MISCELLANEOUS) ×2 IMPLANT
SPONGE GAUZE 2X2 STER 10/PKG (GAUZE/BANDAGES/DRESSINGS) ×1
STRIP CLOSURE SKIN 1/2X4 (GAUZE/BANDAGES/DRESSINGS) ×2 IMPLANT
SUT MNCRL AB 4-0 PS2 18 (SUTURE) ×2 IMPLANT
TOWEL GREEN STERILE FF (TOWEL DISPOSABLE) ×2 IMPLANT
TRAY LAPAROSCOPIC MC (CUSTOM PROCEDURE TRAY) ×2 IMPLANT
TROCAR XCEL BLUNT TIP 100MML (ENDOMECHANICALS) ×2 IMPLANT
TROCAR XCEL NON-BLD 11X100MML (ENDOMECHANICALS) ×2 IMPLANT
TROCAR XCEL NON-BLD 5MMX100MML (ENDOMECHANICALS) ×2 IMPLANT
WATER STERILE IRR 1000ML POUR (IV SOLUTION) ×2 IMPLANT

## 2019-03-24 NOTE — Progress Notes (Signed)
BP up in spite of Norvasc given earlier, Dr. Georgette Dover notified and Lopressor 5 mg IV given. Report given to Raquel Sarna, RN in Short Stay.

## 2019-03-24 NOTE — Progress Notes (Signed)
Pt received back from PACU s/p Lap Chole with 4 lap sites with the upper one 1/2 covered with blood, will continue to monitor.  Will start on CL diet and advance for breakfast if tolerates well.  No c/o pain at present.  Had already instructed on IS pre op.

## 2019-03-24 NOTE — Op Note (Signed)
Laparoscopic Cholecystectomy with IOC Procedure Note  Indications: This patient presents with symptoms consistent with gallbladder disease, but studies showing no obvious abnormalities.  After discussing the situation with the patient, I offered him cholecystectomy.  He agrees with the plan so we will proceed with lap chole with IOC   Pre-operative Diagnosis: Chronic cholecystitis  Post-operative Diagnosis: Same  Surgeon: Maia Petties   Assistants: none  Anesthesia: General endotracheal anesthesia  ASA Class: 2  Procedure Details  The patient was seen again in the Holding Room. The risks, benefits, complications, treatment options, and expected outcomes were discussed with the patient. The possibilities of reaction to medication, pulmonary aspiration, perforation of viscus, bleeding, recurrent infection, finding a normal gallbladder, the need for additional procedures, failure to diagnose a condition, the possible need to convert to an open procedure, and creating a complication requiring transfusion or operation were discussed with the patient. The likelihood of improving the patient's symptoms with return to their baseline status is good.  The patient and/or family concurred with the proposed plan, giving informed consent. The site of surgery properly noted. The patient was taken to Operating Room, identified as Darren Black and the procedure verified as Laparoscopic Cholecystectomy with Intraoperative Cholangiogram. A Time Out was held and the above information confirmed.  Prior to the induction of general anesthesia, antibiotic prophylaxis was administered. General endotracheal anesthesia was then administered and tolerated well. After the induction, the abdomen was prepped with Chloraprep and draped in the sterile fashion. The patient was positioned in the supine position.  Local anesthetic agent was injected into the skin above the umbilicus and an incision made. We dissected down to  the abdominal fascia with blunt dissection.  The fascia was incised vertically and we entered the peritoneal cavity bluntly.  A pursestring suture of 0-Vicryl was placed around the fascial opening.  The Hasson cannula was inserted and secured with the stay suture.  Pneumoperitoneum was then created with CO2 and tolerated well without any adverse changes in the patient's vital signs. An 11-mm port was placed in the subxiphoid position.  Two 5-mm ports were placed in the right upper quadrant. All skin incisions were infiltrated with a local anesthetic agent before making the incision and placing the trocars.   We positioned the patient in reverse Trendelenburg, tilted slightly to the patient's left.  The gallbladder was identified, the fundus grasped and retracted cephalad. There are significant adhesions from the omentum to the gallbladder, consistent with chronic cholecystitis.  Adhesions were lysed bluntly and with the electrocautery where indicated, taking care not to injure any adjacent organs or viscus. The infundibulum was grasped and retracted laterally, exposing the peritoneum overlying the triangle of Calot. This was then divided and exposed in a blunt fashion. A critical view of the cystic duct and cystic artery was obtained.  The cystic duct was clearly identified and bluntly dissected circumferentially. The cystic duct was ligated with a clip distally.   An incision was made in the cystic duct and the [facility] cholangiogram catheter introduced. The catheter was secured using a clip. A cholangiogram was then obtained which showed good visualization of the distal and proximal biliary tree with no sign of filling defects or obstruction.  Contrast flowed easily into the duodenum.  The cystic duct is unusually long and very diminutive in diameter. The catheter was then removed.   The cystic duct was then ligated with clips and divided. The cystic artery was identified, dissected free, ligated with clips and  divided as  well.   The gallbladder was dissected from the liver bed in retrograde fashion with the electrocautery. The gallbladder was removed and placed in an Endocatch sac. The liver bed was irrigated and inspected. Hemostasis was achieved with the electrocautery. Copious irrigation was utilized and was repeatedly aspirated until clear.  The gallbladder and Endocatch sac were then removed through the umbilical port site.  The pursestring suture was used to close the umbilical fascia.    We again inspected the right upper quadrant for hemostasis.  Pneumoperitoneum was released as we removed the trocars.  4-0 Monocryl was used to close the skin.   Benzoin, steri-strips, and clean dressings were applied. The patient was then extubated and brought to the recovery room in stable condition. Instrument, sponge, and needle counts were correct at closure and at the conclusion of the case.   Findings: Cholecystitis without Cholelithiasis  Estimated Blood Loss: less than 50 mL         Drains: none         Specimens: Gallbladder           Complications: None; patient tolerated the procedure well.         Disposition: PACU - hemodynamically stable.         Condition: stable  Wilmon ArmsMatthew K. Corliss Skainssuei, MD, [facility]  General/ Trauma Surgery Beeper [phone]  03/24/2019 4:33 PM

## 2019-03-24 NOTE — Progress Notes (Addendum)
PROGRESS NOTE   Darren Black  UVO:[id number]    DOB: April 03, 1966    DOA: 03/22/2019  PCP: Biagio Borg, MD   I have briefly reviewed patients previous medical records in [facility].  Chief Complaint  Patient presents with   Abdominal Pain   Diarrhea    Brief Narrative:  53 year old married male with PMH of anxiety, depression, BPH, chronic low back pain, HLD, HTN, OSA on CPAP, obesity, appendectomy, has had a complicated 6 weeks course with mostly postprandial diffuse and RUQ abdominal pain, nausea, poor oral intake, reported ~40 pound weight loss, extensively evaluated outpatient by PCP/GI (Dr. Benson Norway) including RUQ ultrasound CT abdomen and pelvis 02/05/2019, RUQ ultrasound 7/6, HIDA scan 03/07/2019, outpatient EGD without remarkable findings for etiology, no clear etiology found, no significant relief, supposed to see general surgery as outpatient, then started having diarrhea 3 days back, presented to the ED with generalized weakness, dizziness, lightheadedness and near syncope.  In ED hypotensive 81/69, dry mucosa.  Admitted for hypotension secondary to dehydration from poor oral intake, acute kidney injury, intractable abdominal pain, nausea and poor oral intake.  GI/Dr. Benson Norway and general surgery consulted.  General surgery suspect symptomatic gallbladder disease and plan laparoscopic cholecystectomy 7/23.   Assessment & Plan:   Principal Problem:   Hypotension Active Problems:   Hyperlipidemia   Anxiety state   Essential hypertension   Abdominal pain   AKI (acute kidney injury) ([place])   Pancreatitis   Intractable abdominal pain, nausea, weight loss/possible symptomatic gallbladder disease  Extensively worked up outpatient as outlined below.  CT abdomen and pelvis with contrast 02/05/2019: No acute intra-abdominal process.  Circumferential bladder wall thickening with multiple diverticuli, likely related to chronic outlet obstruction.  Indeterminate 1.9 cm right adrenal nodule,  probably benign, needs follow-up.  RUQ ultrasound 02/05/2019: Possible hepatic steatosis without focal liver lesions  RUQ ultrasound 03/07/2019: No acute findings or cholecystitis or bile duct dilatation.  Possible early or developing cirrhosis.  HIDA scan 03/07/2019: Patent biliary tree with normal gallbladder EF 67%.  CT abdomen and pelvis without contrast 7/22: No pancreatitis of biliary calculus.  Chronic urinary outlet obstruction with bladder diverticuli.  EGD by Dr. Benson Norway as outpatient reportedly unremarkable.  Progressive symptoms.  No significant relief with sucralfate.  Discussed with Dr. Benson Norway, GI who recommended general surgery consultation.  CCS input appreciated: Indicated that his findings are consistent with symptomatic gallbladder disease despite no findings of cholelithiasis or acute cholecystitis on HIDA or ultrasound, he does have postprandial tenderness in RUQ and they plan laparoscopic cholecystectomy with IOC 7/23.  Diarrhea  Unclear etiology but seems to be self-limiting and resolved.  Hypotension  Secondary to dehydration and antihypertensives.  Resolved after IV fluids.  Now hypertensive.  Dehydration  Secondary to poor oral intake.  Continue IV fluids.  Near syncope  Reportedly occurred in ED while sitting.  Most likely secondary to hypotension.  Monitor.  Acute kidney injury on stage III chronic kidney disease  Baseline creatinine is not entirely clear but may be in the 1.2-1.5 range.  Presented with creatinine of 1.84  Likely related to dehydration, ACEI use and rule out urinary retention.  No hydronephrosis by CT but has chronic lateral outlet obstruction.  ACEI held, treated with IV fluids, improving/creatinine 1.45 today and may be his baseline.   Continue additional 24 hours of IV fluids.  Check bladder scan.  Hypokalemia  Replaced.  Chronic bladder outlet obstruction/prostatomegaly (BPH)  Noted on CT abdomen.  Check bladder scan  for  urinary retention.  Continue tamsulosin.  Consider outpatient urology consultation.  Indeterminate 1.9 cm right adrenal nodule  Noted on CT abdomen 6/6.  Probably benign per radiology report.  Consider follow-up adrenal protocol CT in 12 months for further evaluation.  Essential hypertension  Uncontrolled this morning.  Start amlodipine 5 mg daily, resume home Cardura 4 mg at bedtime.  Hold ACEI. Surgeons have added PRN IV Metoprolol.  Monitor and adjust medications as needed.   Anxiety and depression  Continue home Cymbalta and PRN lorazepam.  OSA on CPAP  Continue CPAP.  Obesity/Body mass index is 40.78 kg/m.   DVT prophylaxis: Lovenox Code Status: Full Family Communication: Discussed with spouse via phone in detail, updated care and answered questions.  Disposition: DC home pending clinical improvement   Consultants:  Dr. Elnoria HowardHung, GI General surgery  Procedures:  None  Antimicrobials:  None   Subjective: Patient seen this morning prior to procedures.  Feels better.  No diarrhea since yesterday.  Generalized abdominal pain has improved but had intermittent sharp pain in the right upper quadrant.  Had not tried breakfast/clear liquids yet to give a clear picture if his pain was going to get worse.  Lately nauseous but no vomiting.  Clearly indicates that his abdominal pain was postprandial and had only been eating applesauce, toast and liquids.  Objective:  Vitals:   03/23/19 2049 03/24/19 0012 03/24/19 0536 03/24/19 1421  BP: (!) 151/100 (!) 142/106 (!) 144/92 (!) 166/101  Pulse: 79 74 60 80  Resp: 17 18 18 20   Temp: 98.2 F (36.8 C) 98.9 F (37.2 C) 98.4 F (36.9 C) 98.3 F (36.8 C)  TempSrc: Oral Oral Oral Oral  SpO2: 98% 100% 100% 98%  Weight:      Height:        Examination:  General exam: Pleasant young male, moderately built and obese sitting up comfortably in bed without distress.  Oral mucosa with borderline hydration. Respiratory  system: Clear to auscultation. Respiratory effort normal. Cardiovascular system: S1 & S2 heard, RRR. No JVD, murmurs, rubs, gallops or clicks. No pedal edema. Gastrointestinal system: Abdomen is nondistended, soft.  Epigastric/RUQ tenderness without rigidity, guarding or rebound. No organomegaly or masses felt. Normal bowel sounds heard. Central nervous system: Alert and oriented. No focal neurological deficits. Extremities: Symmetric 5 x 5 power. Skin: No rashes, lesions or ulcers Psychiatry: Judgement and insight appear normal. Mood & affect appropriate.     Data Reviewed: I have personally reviewed following labs and imaging studies  CBC: Recent Labs  Lab 03/22/19 2328 03/23/19 0057 03/24/19 0222  WBC 6.5  --  4.6  NEUTROABS 4.0  --   --   HGB 15.3 14.6 12.0*  HCT 44.4 43.0 35.4*  MCV 80.4  --  81.8  PLT 248  --  168   Basic Metabolic Panel: Recent Labs  Lab 03/22/19 2328 03/23/19 0057 03/24/19 0222  NA 137 138 142  K 3.0* 3.6 4.9  CL 100  --  112*  CO2 21*  --  22  GLUCOSE 100*  --  119*  BUN 8  --  6  CREATININE 1.84*  --  1.45*  CALCIUM 9.5  --  8.5*   Liver Function Tests: Recent Labs  Lab 03/22/19 2328  AST 19  ALT 22  ALKPHOS 43  BILITOT 1.3*  PROT 7.0  ALBUMIN 4.2   CBG: Recent Labs  Lab 03/22/19 2302  GLUCAP 106*    Recent Results (from the past 240 hour(s))  SARS  Coronavirus 2 (CEPHEID - Performed in [facility] hospital lab), Hosp Order     Status: None   Collection Time: 03/23/19  5:07 AM   Specimen: Nasopharyngeal Swab  Result Value Ref Range Status   SARS Coronavirus 2 NEGATIVE NEGATIVE Final    Comment: (NOTE) If result is NEGATIVE SARS-CoV-2 target nucleic acids are NOT DETECTED. The SARS-CoV-2 RNA is generally detectable in upper and lower  respiratory specimens during the acute phase of infection. The lowest  concentration of SARS-CoV-2 viral copies this assay can detect is 250  copies / mL. A negative result does not preclude  SARS-CoV-2 infection  and should not be used as the sole basis for treatment or other  patient management decisions.  A negative result may occur with  improper specimen collection / handling, submission of specimen other  than nasopharyngeal swab, presence of viral mutation(s) within the  areas targeted by this assay, and inadequate number of viral copies  (<250 copies / mL). A negative result must be combined with clinical  observations, patient history, and epidemiological information. If result is POSITIVE SARS-CoV-2 target nucleic acids are DETECTED. The SARS-CoV-2 RNA is generally detectable in upper and lower  respiratory specimens dur ing the acute phase of infection.  Positive  results are indicative of active infection with SARS-CoV-2.  Clinical  correlation with patient history and other diagnostic information is  necessary to determine patient infection status.  Positive results do  not rule out bacterial infection or co-infection with other viruses. If result is PRESUMPTIVE POSTIVE SARS-CoV-2 nucleic acids MAY BE PRESENT.   A presumptive positive result was obtained on the submitted specimen  and confirmed on repeat testing.  While 2019 novel coronavirus  (SARS-CoV-2) nucleic acids may be present in the submitted sample  additional confirmatory testing may be necessary for epidemiological  and / or clinical management purposes  to differentiate between  SARS-CoV-2 and other Sarbecovirus currently known to infect humans.  If clinically indicated additional testing with an alternate test  methodology [phone]([id number]) is advised. The SARS-CoV-2 RNA is generally  detectable in upper and lower respiratory sp ecimens during the acute  phase of infection. The expected result is Negative. Fact Sheet for Patients:  BoilerBrush.com.cyhttps://www.fda.gov/media/136312/download Fact Sheet for Healthcare Providers: https://pope.com/https://www.fda.gov/media/136313/download This test is not yet approved or cleared by the  [place] FDA and has been authorized for detection and/or diagnosis of SARS-CoV-2 by FDA under an Emergency Use Authorization (EUA).  This EUA will remain in effect (meaning this test can be used) for the duration of the COVID-19 declaration under Section 564(b)(1) of the Act, 21 U.S.C. section 360bbb-3(b)(1), unless the authorization is terminated or revoked sooner. Performed at [facility], [address] [place], [place] [address]   Surgical pcr screen     Status: None   Collection Time: 03/24/19 11:23 AM   Specimen: Nasal Mucosa; Nasal Swab  Result Value Ref Range Status   MRSA, PCR NEGATIVE NEGATIVE Final   Staphylococcus aureus NEGATIVE NEGATIVE Final    Comment: (NOTE) The Xpert SA Assay (FDA approved for NASAL specimens in patients 52 years of age and older), is one component of a comprehensive surveillance program. It is not intended to diagnose infection nor to guide or monitor treatment. Performed at [facility], [address] [place], [place] [address]          Radiology Studies: Ct Abdomen Pelvis Wo Contrast  Result Date: 03/23/2019 CLINICAL DATA:  Pancreatitis EXAM: CT ABDOMEN AND PELVIS  WITHOUT CONTRAST TECHNIQUE: Multidetector CT imaging of the abdomen and pelvis was performed following the standard protocol without IV contrast. COMPARISON:  02/05/2019 FINDINGS: Lower chest:  No contributory findings. Hepatobiliary: No focal liver abnormality.No evidence of biliary obstruction or stone. Pancreas: Unremarkable. Spleen: Unremarkable. Adrenals/Urinary Tract: 12 mm right adrenal nodule, stable and again most likely an adenoma. No hydronephrosis or stone. Chronic circumferentially thick walled bladder with diverticula. There is history of BPH and urethral dilatation. Stomach/Bowel: No obstruction. Appendectomy with small residual. Mild left colonic diverticulosis. Tiny low densities in the lower pole right kidney too small for densitometry. Left renal  cystic density. Vascular/Lymphatic: No acute vascular abnormality. Atherosclerotic calcifications. No mass or adenopathy. Reproductive:Nonspecific prostatic calcification. Seminal vesicles displaced to the right by bladder diverticulum. Other: No ascites or pneumoperitoneum.  Small fatty umbilical hernia Musculoskeletal: No acute abnormalities. IMPRESSION: 1. No visible pancreatitis or biliary calculus. 2. Chronic urinary outlet obstruction with bladder diverticula. 3. Stable from CT last month. Electronically Signed   By: Marnee SpringJonathon  Watts M.D.   On: 03/23/2019 04:11        Scheduled Meds:  amLODipine  5 mg Oral Daily   chlorhexidine  15 mL Mouth Rinse BID   dicyclomine  10 mg Oral TID AC & HS   doxazosin  4 mg Oral QHS   DULoxetine  30 mg Oral Daily   feeding supplement  1 Container Oral TID BM   mouth rinse  15 mL Mouth Rinse q12n4p   pantoprazole  40 mg Oral Daily   sucralfate  1 g Oral TID WC & HS   tamsulosin  0.4 mg Oral Daily   Continuous Infusions:   ceFAZolin (ANCEF) IV     dextrose 5 % and 0.9% NaCl 100 mL/hr at 03/24/19 1308     LOS: 1 day     Marcellus ScottAnand Maricia Scotti, MD, FACP, [facility]. [facility]  To contact the attending provider between 7A-7P or the covering provider during after hours 7P-7A, please log into the web site [email or web address] and access using universal [place] password for that web site. If you do not have the password, please call the hospital operator.  03/24/2019, 2:33 PM

## 2019-03-24 NOTE — Progress Notes (Signed)
Initial Nutrition Assessment  RD working remotely.  DOCUMENTATION CODES:   Morbid obesity  INTERVENTION:   -RD will follow for diet advancement and supplement as appropriate -Resume Boost Breeze po TID, each supplement provides 250 kcal and 9 grams of protein upon diet advancemennt  NUTRITION DIAGNOSIS:   Inadequate oral intake related to altered GI function as evidenced by NPO status.  GOAL:   Patient will meet greater than or equal to 90% of their needs  MONITOR:   PO intake, Supplement acceptance, Diet advancement, Labs, Weight trends, Skin, I & O's  REASON FOR ASSESSMENT:   Malnutrition Screening Tool    ASSESSMENT:   53 year old married male with PMH of anxiety, depression, BPH, chronic low back pain, HLD, HTN, OSA on CPAP, obesity, appendectomy, has had a complicated 6 weeks course with mostly postprandial diffuse and RUQ abdominal pain, nausea, poor oral intake, reported ~40 pound weight loss, extensively evaluated outpatient by PCP/GI (Dr. Benson Norway) including RUQ ultrasound CT abdomen and pelvis 02/05/2019, RUQ ultrasound 7/6, HIDA scan 03/07/2019, outpatient EGD without remarkable findings for etiology, no clear etiology found, no significant relief, supposed to see general surgery as outpatient, then started having diarrhea 3 days back, presented to the ED with generalized weakness, dizziness, lightheadedness and near syncope.  In ED hypotensive 81/69, dry mucosa.  Admitted for hypotension secondary to dehydration from poor oral intake, acute kidney injury, intractable abdominal pain, nausea and poor oral intake.  GI/Dr. Benson Norway and general surgery consulted.  General surgery suspect symptomatic gallbladder disease and plan laparoscopic cholecystectomy 7/23.  Pt admitted with RUQ pain.  Reviewed I/O's: +2.3 L x 24 hours and +3.4 L since admission  Per general surgery notes, pt with symptomatic gallbladder diease. Pt is currently NPO with plans to undergo laparoscopic  cholecystectomy with intraoperative cholangiogram.   Pt in OR at time of visit unable to provide additional history. Per H&P, pt unable to take down solid foods for 5 days PTA due to abdominal pain. Prior to this, he was following a low fat diet.   Reviewed wt hx; pt has experienced a 6.7% wt loss over the past 2 months. While this is not significant, it is concerning given decreased oral intake over the past week.   IVFS: 5% dextrose and 0.9% NaCl @ 100 ml/hr  Labs reviewed.   Diet Order:   Diet Order            Diet NPO time specified  Diet effective now              EDUCATION NEEDS:   No education needs have been identified at this time  Skin:  Skin Assessment: Reviewed RN Assessment  Last BM:  03/23/19  Height:   Ht Readings from Last 1 Encounters:  03/24/19 5\' 7"  (1.702 m)    Weight:   Wt Readings from Last 1 Encounters:  03/24/19 118.1 kg    Ideal Body Weight:  67.3 kg  BMI:  Body mass index is 40.78 kg/m.  Estimated Nutritional Needs:   Kcal:  1800-2000  Protein:  100-115 grams  Fluid:  1.8-2.0 L    Adaria Hole A. Jimmye Norman, RD, LDN, [place] Registered Dietitian II Certified Diabetes Care and Education Specialist Pager: [phone] After hours Pager: [phone]

## 2019-03-24 NOTE — Anesthesia Postprocedure Evaluation (Signed)
Anesthesia Post Note  Patient: Darren Black  Procedure(s) Performed: LAPAROSCOPIC CHOLECYSTECTOMY WITH INTRAOPERATIVE CHOLANGIOGRAM (N/A Abdomen)     Patient location during evaluation: PACU Anesthesia Type: General Level of consciousness: sedated Pain management: pain level controlled Vital Signs Assessment: post-procedure vital signs reviewed and stable Respiratory status: spontaneous breathing and respiratory function stable Cardiovascular status: stable Postop Assessment: no apparent nausea or vomiting Anesthetic complications: no    Last Vitals:  Vitals:   03/24/19 1710 03/24/19 1725  BP: 115/71 110/69  Pulse: 63 60  Resp: 17 12  Temp:    SpO2: 96% 99%    Last Pain:  Vitals:   03/24/19 1725  TempSrc:   PainSc: 0-No pain                 Tanay Misuraca DANIEL

## 2019-03-24 NOTE — Anesthesia Preprocedure Evaluation (Addendum)
Anesthesia Evaluation  Patient identified by MRN, date of birth, ID band Patient awake    Reviewed: Allergy & Precautions, NPO status , Patient's Chart, lab work & pertinent test results  History of Anesthesia Complications (+) PONV and history of anesthetic complications  Airway Mallampati: III  TM Distance: >3 FB     Dental no notable dental hx. (+) Dental Advisory Given   Pulmonary sleep apnea and Continuous Positive Airway Pressure Ventilation ,    Pulmonary exam normal        Cardiovascular hypertension, Pt. on medications Normal cardiovascular exam     Neuro/Psych PSYCHIATRIC DISORDERS Anxiety Depression negative neurological ROS     GI/Hepatic negative GI ROS, Neg liver ROS,   Endo/Other  Morbid obesity  Renal/GU Renal InsufficiencyRenal disease     Musculoskeletal negative musculoskeletal ROS (+)   Abdominal   Peds  Hematology negative hematology ROS (+)   Anesthesia Other Findings Day of surgery medications reviewed with the patient.  Reproductive/Obstetrics                            Anesthesia Physical Anesthesia Plan  ASA: III  Anesthesia Plan: General   Post-op Pain Management:    Induction: Intravenous  PONV Risk Score and Plan: 4 or greater and Ondansetron, Dexamethasone, Scopolamine patch - Pre-op and Diphenhydramine  Airway Management Planned: Oral ETT  Additional Equipment:   Intra-op Plan:   Post-operative Plan: Extubation in OR  Informed Consent: I have reviewed the patients History and Physical, chart, labs and discussed the procedure including the risks, benefits and alternatives for the proposed anesthesia with the patient or authorized representative who has indicated his/her understanding and acceptance.     Dental advisory given  Plan Discussed with: Anesthesiologist  Anesthesia Plan Comments:        Anesthesia Quick Evaluation

## 2019-03-24 NOTE — Anesthesia Procedure Notes (Signed)
Procedure Name: Intubation Date/Time: 03/24/2019 3:23 PM Performed by: Shirlyn Goltz, CRNA Pre-anesthesia Checklist: Emergency Drugs available, Suction available, Patient identified and Patient being monitored Patient Re-evaluated:Patient Re-evaluated prior to induction Oxygen Delivery Method: Circle system utilized Preoxygenation: Pre-oxygenation with 100% oxygen Induction Type: IV induction and Rapid sequence Laryngoscope Size: Mac and 4 Grade View: Grade I Tube type: Oral Tube size: 7.5 mm Number of attempts: 1 Airway Equipment and Method: Stylet Placement Confirmation: ETT inserted through vocal cords under direct vision,  positive ETCO2 and breath sounds checked- equal and bilateral Secured at: 23 cm Tube secured with: Tape Dental Injury: Teeth and Oropharynx as per pre-operative assessment

## 2019-03-24 NOTE — Consult Note (Signed)
[facility] Consult/Admission Note  Darren ShearerJohn Black [birth year]  [id number].    Requesting Provider: Dr. Waymon AmatoHongalgi Chief Complaint/Reason for Consult: RUQ abdominal pain, nausea and diarrhea  HPI:   Pt is a 53 yo male with a hx of  hypertension, dyslipidemia, obstructive sleep apnea, anxiety, depression and BPH who was admitted for hypovolemic hypotension, AKI, and mild pancreatitis. Pt has been seen by Dr. Elnoria HowardHung. He had a negative HIDA scan and US that showed a gallbladder polyp without signs of cholecystitis. LFT's WNL, Lipase 73, and Tbili 1.3. Pt states about 6 weeks ago he began having sharp, intermittent, severe at times, RUQ pain that would radiate into his back. These pains occurred a hour or more after eating. He was able to eat nonfat foods without pain up until about 5 days ago when anything he would eat would cause pain. Water does not cause pain. Four days ago he began having watery diarrhea. Associated nausea but no vomiting. No blood in his stools. He denies fever, chills, SOB, or other associated symptoms.   Pt has a hx of open appendectomy he states around 20 years ago. No anticoagulation.   ROS:  Review of Systems  Constitutional: Negative for chills, diaphoresis and fever.  HENT: Negative for sore throat.   Respiratory: Negative for cough and shortness of breath.   Cardiovascular: Negative for chest pain.  Gastrointestinal: Positive for abdominal pain, diarrhea and nausea. Negative for blood in stool, constipation and vomiting.  Genitourinary: Negative for dysuria.  Skin: Negative for rash.  Neurological: Negative for dizziness and loss of consciousness.  All other systems reviewed and are negative.    Family History  Problem Relation Age of Onset  . Coronary artery disease Father   . Cancer Father        anaplastic thyroid cancer  . Stroke Other        1st degree relative  . Diabetes Other        1st degree relative  . Hypertension Other   .  Hyperlipidemia Other     Past Medical History:  Diagnosis Date  . Anxiety   . BPH (benign prostatic hypertrophy)   . Chronic lower back pain    nerve damage w/ leg pain  . Depression   . Diarrhea 03/23/2019  . Family history of adverse reaction to anesthesia    MOTHER--- HARD TO WAKE  . History of panic attacks   . Hyperlipidemia   . Hypertension   . Meatal stenosis   . OSA on CPAP    moderate to severe OSA  per study 04-28-2007 uses CPAP  . PONV (postoperative nausea and vomiting)     Past Surgical History:  Procedure Laterality Date  . APPENDECTOMY  2003  . BREATH TEK H PYLORI  04/27/2012   Procedure: BREATH TEK H PYLORI;  Surgeon: Valarie MerinoMatthew B Martin, MD;  [facility];  Service: General;  Laterality: N/A;  . CYSTOSCOPY WITH URETHRAL DILATATION N/A 04/12/2015   Procedure: CYSTOSCOPY WITH URETHRAL DILATATION, RETROGRADE AND URETHROGRAM WITH BLADDER BIOPSY;  Surgeon: Bjorn PippinJohn Wrenn, MD;  [facility] [place];  Service: Urology;  Laterality: N/A;  . ROTATOR CUFF REPAIR Right 01-21-2012  . TRANSTHORACIC ECHOCARDIOGRAM  03-26-2007   normal LVSF, ef 60%,  mild AV calcification without stenosis,  mild MR and TR,  mild LAE  . URETHROGRAM N/A 04/12/2015   Procedure: URETHROGRAM;  Surgeon: Bjorn PippinJohn Wrenn, MD;  [facility] [place];  Service: Urology;  Laterality: N/A;    Social  History:  reports that he has never smoked. He has never used smokeless tobacco. He reports current alcohol use. He reports that he does not use drugs.  Allergies:  Allergies  Allergen Reactions  . Crestor [Rosuvastatin Calcium] Other (See Comments)    abd pain  . Flomax [Tamsulosin Hcl] Rash    Medications Prior to Admission  Medication Sig Dispense Refill  . amLODipine (NORVASC) 5 MG tablet Take 1 tablet by mouth once daily (Patient taking differently: Take 5 mg by mouth daily as needed (high blood pressure). ) 90 tablet 0  . benazepril (LOTENSIN) 40 MG tablet Take 1  tablet (40 mg total) by mouth daily. 90 tablet 3  . doxazosin (CARDURA) 4 MG tablet Take 1 tablet (4 mg total) by mouth at bedtime. 90 tablet 3  . DULoxetine (CYMBALTA) 30 MG capsule Take 1 capsule (30 mg total) by mouth daily. 90 capsule 3  . LORazepam (ATIVAN) 1 MG tablet Take 1 tablet by mouth twice daily as needed for anxiety 60 tablet 2  . oxyCODONE-acetaminophen (PERCOCET/ROXICET) 5-325 MG tablet Take 1 tablet by mouth daily as needed for severe pain. 30 tablet 0  . sucralfate (CARAFATE) 1 g tablet Take 1 tablet (1 g total) by mouth 4 (four) times daily -  with meals and at bedtime. 20 tablet 0  . tamsulosin (FLOMAX) 0.4 MG CAPS capsule Take 1 capsule (0.4 mg total) by mouth daily. 90 capsule 3  . pantoprazole (PROTONIX) 40 MG tablet Take 1 tablet (40 mg total) by mouth 2 (two) times daily before a meal. (Patient not taking: Reported on 03/23/2019) 60 tablet 1  . pravastatin (PRAVACHOL) 80 MG tablet Take 1 tablet (80 mg total) by mouth daily. (Patient not taking: Reported on 03/23/2019) 90 tablet 3  . rosuvastatin (CRESTOR) 20 MG tablet Take 1 tablet (20 mg total) by mouth daily. (Patient not taking: Reported on 03/23/2019) 90 tablet 3  . testosterone (ANDROGEL) 50 MG/5GM (1%) GEL Place 5 g onto the skin daily. (Patient not taking: Reported on 03/23/2019) 450 g 1  . triamcinolone (NASACORT) 55 MCG/ACT AERO nasal inhaler Place 2 sprays into the nose daily. (Patient not taking: Reported on 03/23/2019) 1 Inhaler 12    Blood pressure (!) 144/92, pulse 60, temperature 98.4 F (36.9 C), temperature source Oral, resp. rate 18, height  (1.702 m), weight 118.1 kg, SpO2 100 %.  Physical Exam Vitals signs and nursing note reviewed.  Constitutional:      General: He is not in acute distress.    Appearance: Normal appearance. He is obese. He is not ill-appearing, toxic-appearing or diaphoretic.  HENT:     Head: Normocephalic and atraumatic.     Nose: Nose normal.     Mouth/Throat:     Comments: Pt  wearing a mask Eyes:     General: No scleral icterus.       Right eye: No discharge.        Left eye: No discharge.     Conjunctiva/sclera: Conjunctivae normal.     Pupils: Pupils are equal, round, and reactive to light.  Neck:     Musculoskeletal: Normal range of motion and neck supple.  Cardiovascular:     Rate and Rhythm: Normal rate and regular rhythm.     Pulses:          Radial pulses are 2+ on the right side and 2+ on the left side.     Heart sounds: Normal heart sounds. No murmur.  Pulmonary:  Effort: Pulmonary effort is normal. No respiratory distress.     Breath sounds: Normal breath sounds. No wheezing, rhonchi or rales.  Abdominal:     General: Bowel sounds are normal. There is no distension.     Palpations: Abdomen is soft. Abdomen is not rigid.     Tenderness: There is abdominal tenderness (mild) in the right upper quadrant and epigastric area. There is no guarding. Negative signs include Murphy's sign.  Musculoskeletal: Normal range of motion.        General: No tenderness.  Skin:    General: Skin is warm and dry.     Findings: No rash.  Neurological:     Mental Status: He is alert and oriented to person, place, and time.  Psychiatric:        Mood and Affect: Mood normal.        Behavior: Behavior normal.     Results for orders placed or performed during the hospital encounter of 03/22/19 (from the past 48 hour(s))  CBG monitoring, ED     Status: Abnormal   Collection Time: 03/22/19 11:02 PM  Result Value Ref Range   Glucose-Capillary 106 (H) 70 - 99 mg/dL  Comprehensive metabolic panel     Status: Abnormal   Collection Time: 03/22/19 11:28 PM  Result Value Ref Range   Sodium 137 135 - 145 mmol/L   Potassium 3.0 (L) 3.5 - 5.1 mmol/L   Chloride 100 98 - 111 mmol/L   CO2 21 (L) 22 - 32 mmol/L   Glucose, Bld 100 (H) 70 - 99 mg/dL   BUN 8 6 - 20 mg/dL   Creatinine, Ser 9.56 (H) 0.61 - 1.24 mg/dL   Calcium 9.5 8.9 - 21.3 mg/dL   Total Protein 7.0 6.5 -  8.1 g/dL   Albumin 4.2 3.5 - 5.0 g/dL   AST 19 15 - 41 U/L   ALT 22 0 - 44 U/L   Alkaline Phosphatase 43 38 - 126 U/L   Total Bilirubin 1.3 (H) 0.3 - 1.2 mg/dL   GFR calc non Af Amer 41 (L) >60 mL/min   GFR calc Af Amer 48 (L) >60 mL/min   Anion gap 16 (H) 5 - 15    Comment: Performed at [facility], [address], [place], [place] [address]  CBC with Differential     Status: None   Collection Time: 03/22/19 11:28 PM  Result Value Ref Range   WBC 6.5 4.0 - 10.5 K/uL   RBC 5.52 4.22 - 5.81 MIL/uL   Hemoglobin 15.3 13.0 - 17.0 g/dL   HCT 84.6 96.2 - 95.2 %   MCV 80.4 80.0 - 100.0 fL   MCH 27.7 26.0 - 34.0 pg   MCHC 34.5 30.0 - 36.0 g/dL   RDW 84.1 32.4 - 40.1 %   Platelets 248 150 - 400 K/uL   nRBC 0.0 0.0 - 0.2 %   Neutrophils Relative % 61 %   Neutro Abs 4.0 1.7 - 7.7 K/uL   Lymphocytes Relative 28 %   Lymphs Abs 1.8 0.7 - 4.0 K/uL   Monocytes Relative 7 %   Monocytes Absolute 0.5 0.1 - 1.0 K/uL   Eosinophils Relative 3 %   Eosinophils Absolute 0.2 0.0 - 0.5 K/uL   Basophils Relative 1 %   Basophils Absolute 0.1 0.0 - 0.1 K/uL   Immature Granulocytes 0 %   Abs Immature Granulocytes 0.02 0.00 - 0.07 K/uL    Comment: Performed at [facility],  [address] [place], [place] [address]  Lipase, blood     Status: Abnormal   Collection Time: 03/22/19 11:28 PM  Result Value Ref Range   Lipase 73 (H) 11 - 51 U/L    Comment: Performed at [facility], [address] [place], [place] [address]  Lactic acid, plasma     Status: None   Collection Time: 03/22/19 11:29 PM  Result Value Ref Range   Lactic Acid, Venous 1.5 0.5 - 1.9 mmol/L    Comment: Performed at [facility], [address] [place], [place] [address]  POCT I-Stat EG7     Status: Abnormal   Collection Time: 03/23/19 12:57 AM  Result Value Ref Range   pH, Ven 7.372 7.250 - 7.430   pCO2, Ven 37.0 (L) 44.0 - 60.0 mmHg   pO2, Ven 35.0 32.0 - 45.0 mmHg   Bicarbonate 21.5 20.0 - 28.0 mmol/L    TCO2 23 22 - 32 mmol/L   O2 Saturation 66.0 %   Acid-base deficit 3.0 (H) 0.0 - 2.0 mmol/L   Sodium 138 135 - 145 mmol/L   Potassium 3.6 3.5 - 5.1 mmol/L   Calcium, Ion 1.18 1.15 - 1.40 mmol/L   HCT 43.0 39.0 - 52.0 %   Hemoglobin 14.6 13.0 - 17.0 g/dL   Patient temperature HIDE    Sample type VENOUS    Comment NOTIFIED PHYSICIAN   Lactic acid, plasma     Status: None   Collection Time: 03/23/19  1:10 AM  Result Value Ref Range   Lactic Acid, Venous 1.8 0.5 - 1.9 mmol/L    Comment: Performed at [facility], [address] [place], [place] [address]  Urinalysis, Routine w reflex microscopic     Status: Abnormal   Collection Time: 03/23/19  1:50 AM  Result Value Ref Range   Color, Urine YELLOW YELLOW   APPearance HAZY (A) CLEAR   Specific Gravity, Urine 1.006 1.005 - 1.030   pH 6.0 5.0 - 8.0   Glucose, UA NEGATIVE NEGATIVE mg/dL   Hgb urine dipstick SMALL (A) NEGATIVE   Bilirubin Urine NEGATIVE NEGATIVE   Ketones, ur 20 (A) NEGATIVE mg/dL   Protein, ur NEGATIVE NEGATIVE mg/dL   Nitrite NEGATIVE NEGATIVE   Leukocytes,Ua MODERATE (A) NEGATIVE   RBC / HPF 6-10 0 - 5 RBC/hpf   WBC, UA 6-10 0 - 5 WBC/hpf   Bacteria, UA RARE (A) NONE SEEN   Squamous Epithelial / LPF 0-5 0 - 5    Comment: Performed at [facility], [address] [place], [place] [address]  Urine rapid drug screen (hosp performed)     Status: Abnormal   Collection Time: 03/23/19  1:50 AM  Result Value Ref Range   Opiates NONE DETECTED NONE DETECTED   Cocaine NONE DETECTED NONE DETECTED   Benzodiazepines POSITIVE (A) NONE DETECTED   Amphetamines NONE DETECTED NONE DETECTED   Tetrahydrocannabinol NONE DETECTED NONE DETECTED   Barbiturates NONE DETECTED NONE DETECTED    Comment: (NOTE) DRUG SCREEN FOR MEDICAL PURPOSES ONLY.  IF CONFIRMATION IS NEEDED FOR ANY PURPOSE, NOTIFY LAB WITHIN 5 DAYS. LOWEST DETECTABLE LIMITS FOR URINE DRUG SCREEN Drug Class                     Cutoff  (ng/mL) Amphetamine and metabolites    1000 Barbiturate and metabolites    200 Benzodiazepine  200 Tricyclics and metabolites     300 Opiates and metabolites        300 Cocaine and metabolites        300 THC                            50 Performed at [facility], [address], [place], [place] [address]   SARS Coronavirus 2 (CEPHEID - Performed in [facility] hospital lab), Hosp Order     Status: None   Collection Time: 03/23/19  5:07 AM   Specimen: Nasopharyngeal Swab  Result Value Ref Range   SARS Coronavirus 2 NEGATIVE NEGATIVE    Comment: (NOTE) If result is NEGATIVE SARS-CoV-2 target nucleic acids are NOT DETECTED. The SARS-CoV-2 RNA is generally detectable in upper and lower  respiratory specimens during the acute phase of infection. The lowest  concentration of SARS-CoV-2 viral copies this assay can detect is 250  copies / mL. A negative result does not preclude SARS-CoV-2 infection  and should not be used as the sole basis for treatment or other  patient management decisions.  A negative result may occur with  improper specimen collection / handling, submission of specimen other  than nasopharyngeal swab, presence of viral mutation(s) within the  areas targeted by this assay, and inadequate number of viral copies  (<250 copies / mL). A negative result must be combined with clinical  observations, patient history, and epidemiological information. If result is POSITIVE SARS-CoV-2 target nucleic acids are DETECTED. The SARS-CoV-2 RNA is generally detectable in upper and lower  respiratory specimens dur ing the acute phase of infection.  Positive  results are indicative of active infection with SARS-CoV-2.  Clinical  correlation with patient history and other diagnostic information is  necessary to determine patient infection status.  Positive results do  not rule out bacterial infection or co-infection with other viruses. If result is PRESUMPTIVE  POSTIVE SARS-CoV-2 nucleic acids MAY BE PRESENT.   A presumptive positive result was obtained on the submitted specimen  and confirmed on repeat testing.  While 2019 novel coronavirus  (SARS-CoV-2) nucleic acids may be present in the submitted sample  additional confirmatory testing may be necessary for epidemiological  and / or clinical management purposes  to differentiate between  SARS-CoV-2 and other Sarbecovirus currently known to infect humans.  If clinically indicated additional testing with an alternate test  methodology [phone]) is advised. The SARS-CoV-2 RNA is generally  detectable in upper and lower respiratory sp ecimens during the acute  phase of infection. The expected result is Negative. Fact Sheet for Patients:  BoilerBrush.com.cy Fact Sheet for Healthcare Providers: https://pope.com/ This test is not yet approved or cleared by the [place] FDA and has been authorized for detection and/or diagnosis of SARS-CoV-2 by FDA under an Emergency Use Authorization (EUA).  This EUA will remain in effect (meaning this test can be used) for the duration of the COVID-19 declaration under Section 564(b)(1) of the Act, 21 U.S.C. section 360bbb-3(b)(1), unless the authorization is terminated or revoked sooner. Performed at [facility], [address], [place], [place] [address]   HIV antibody (Routine Testing)     Status: None   Collection Time: 03/23/19 10:33 AM  Result Value Ref Range   HIV Screen 4th Generation wRfx Non Reactive Non Reactive    Comment: (NOTE) Performed At: [facility] [address] [place], [place] [id number] Jolene Schimke MD HY:[id number]  Basic metabolic panel     Status: Abnormal   Collection Time: 03/24/19  2:22 AM  Result Value Ref Range   Sodium 142 135 - 145 mmol/L   Potassium 4.9 3.5 - 5.1 mmol/L    Comment: DELTA CHECK NOTED HEMOLYSIS AT THIS LEVEL MAY AFFECT RESULT     Chloride 112 (H) 98 - 111 mmol/L   CO2 22 22 - 32 mmol/L   Glucose, Bld 119 (H) 70 - 99 mg/dL   BUN 6 6 - 20 mg/dL   Creatinine, Ser 1.45 (H) 0.61 - 1.24 mg/dL   Calcium 8.5 (L) 8.9 - 10.3 mg/dL   GFR calc non Af Amer 55 (L) >60 mL/min   GFR calc Af Amer >60 >60 mL/min   Anion gap 8 5 - 15    Comment: Performed at [facility] [address] [place], [place] [address]  CBC     Status: Abnormal   Collection Time: 03/24/19  2:22 AM  Result Value Ref Range   WBC 4.6 4.0 - 10.5 K/uL   RBC 4.33 4.22 - 5.81 MIL/uL   Hemoglobin 12.0 (L) 13.0 - 17.0 g/dL   HCT 35.4 (L) 39.0 - 52.0 %   MCV 81.8 80.0 - 100.0 fL   MCH 27.7 26.0 - 34.0 pg   MCHC 33.9 30.0 - 36.0 g/dL   RDW 13.2 11.5 - 15.5 %   Platelets 168 150 - 400 K/uL   nRBC 0.0 0.0 - 0.2 %    Comment: Performed at [facility], [place] [address], [place], [place] [address]   Ct Abdomen Pelvis Wo Contrast  Result Date: 03/23/2019 CLINICAL DATA:  Pancreatitis EXAM: CT ABDOMEN AND PELVIS WITHOUT CONTRAST TECHNIQUE: Multidetector CT imaging of the abdomen and pelvis was performed following the standard protocol without IV contrast. COMPARISON:  02/05/2019 FINDINGS: Lower chest:  No contributory findings. Hepatobiliary: No focal liver abnormality.No evidence of biliary obstruction or stone. Pancreas: Unremarkable. Spleen: Unremarkable. Adrenals/Urinary Tract: 12 mm right adrenal nodule, stable and again most likely an adenoma. No hydronephrosis or stone. Chronic circumferentially thick walled bladder with diverticula. There is history of BPH and urethral dilatation. Stomach/Bowel: No obstruction. Appendectomy with small residual. Mild left colonic diverticulosis. Tiny low densities in the lower pole right kidney too small for densitometry. Left renal cystic density. Vascular/Lymphatic: No acute vascular abnormality. Atherosclerotic calcifications. No mass or adenopathy. Reproductive:Nonspecific prostatic calcification. Seminal vesicles  displaced to the right by bladder diverticulum. Other: No ascites or pneumoperitoneum.  Small fatty umbilical hernia Musculoskeletal: No acute abnormalities. IMPRESSION: 1. No visible pancreatitis or biliary calculus. 2. Chronic urinary outlet obstruction with bladder diverticula. 3. Stable from CT last month. Electronically Signed   By: Monte Fantasia M.D.   On: 03/23/2019 04:11      Assessment/Plan Principal Problem:   Hypotension Active Problems:   Hyperlipidemia   Anxiety state   Essential hypertension   Abdominal pain   AKI (acute kidney injury) ([place])   Pancreatitis  RUQ abdominal pain without imaging or labs to confirm the etiology of pain is his gallbladder. Sometimes pts will present with RUQ pain and have a normal workup. Some of these pts will experience relief of pain with cholecystectomy. This is not always the case. We could remove his gallbladder and he could or could not have relief of his symptoms. Will discuss with MD if this can be done this admission or if pt will need to follow up as an output for elective procedure.  Thank you for the consult.    Jerre SimonJessica L Amilee Janvier, [facility] 03/24/2019, 11:01 AM Pager: [phone] Consults: [phone] Mon-Fri 7:00 am-4:30 pm Sat-Sun 7:00 am-11:30 am

## 2019-03-24 NOTE — Progress Notes (Signed)
Notified Dr. Algis Liming that [name] BP was high and his BP meds had been stopped when he came in initially because he was hypotensive at the time, BP restarted.

## 2019-03-24 NOTE — Progress Notes (Signed)
RT placed pt on CPAP dream station for the night in auto titrate max 12 min 5 with no O2 blended in to system. Pt respiratory status is stable at this time. RT will continue to monitor.

## 2019-03-24 NOTE — Transfer of Care (Signed)
Immediate Anesthesia Transfer of Care Note  Patient: Darren Black  Procedure(s) Performed: LAPAROSCOPIC CHOLECYSTECTOMY WITH INTRAOPERATIVE CHOLANGIOGRAM (N/A Abdomen)  Patient Location: PACU  Anesthesia Type:General  Level of Consciousness: awake, alert , oriented and patient cooperative  Airway & Oxygen Therapy: Patient Spontanous Breathing and Patient connected to nasal cannula oxygen  Post-op Assessment: Report given to RN and Post -op Vital signs reviewed and stable  Post vital signs: Reviewed and stable  Last Vitals:  Vitals Value Taken Time  BP 84/67 03/24/19 1636  Temp    Pulse 67 03/24/19 1637  Resp 14 03/24/19 1637  SpO2 88 % 03/24/19 1637  Vitals shown include unvalidated device data.  Last Pain:  Vitals:   03/24/19 1510  TempSrc:   PainSc: 5       Patients Stated Pain Goal: 2 (62/95/28 4132)  Complications: No apparent anesthesia complications

## 2019-03-24 NOTE — Progress Notes (Signed)
Pt is refusing his Carafate, reviewed with him its action.

## 2019-03-24 NOTE — Progress Notes (Signed)
Did bladder scan after first void post op and it was 463 cc.  Pt states he has a urologist and normally has urinary retention.  He takes flomax.

## 2019-03-25 ENCOUNTER — Encounter (HOSPITAL_COMMUNITY): Payer: Self-pay | Admitting: Surgery

## 2019-03-25 ENCOUNTER — Telehealth: Payer: Self-pay | Admitting: Internal Medicine

## 2019-03-25 ENCOUNTER — Telehealth: Payer: Medicare HMO | Admitting: Nurse Practitioner

## 2019-03-25 DIAGNOSIS — E785 Hyperlipidemia, unspecified: Secondary | ICD-10-CM

## 2019-03-25 DIAGNOSIS — N183 Chronic kidney disease, stage 3 (moderate): Secondary | ICD-10-CM

## 2019-03-25 DIAGNOSIS — G4733 Obstructive sleep apnea (adult) (pediatric): Secondary | ICD-10-CM

## 2019-03-25 DIAGNOSIS — K819 Cholecystitis, unspecified: Secondary | ICD-10-CM

## 2019-03-25 LAB — COMPREHENSIVE METABOLIC PANEL
ALT: 51 U/L — ABNORMAL HIGH (ref 0–44)
AST: 61 U/L — ABNORMAL HIGH (ref 15–41)
Albumin: 3.2 g/dL — ABNORMAL LOW (ref 3.5–5.0)
Alkaline Phosphatase: 38 U/L (ref 38–126)
Anion gap: 8 (ref 5–15)
BUN: <5 mg/dL — ABNORMAL LOW (ref 6–20)
CO2: 24 mmol/L (ref 22–32)
Calcium: 8.9 mg/dL (ref 8.9–10.3)
Chloride: 108 mmol/L (ref 98–111)
Creatinine, Ser: 1.44 mg/dL — ABNORMAL HIGH (ref 0.61–1.24)
GFR calc Af Amer: >60 mL/min (ref >60)
GFR calc non Af Amer: 55 mL/min — ABNORMAL LOW (ref >60)
Glucose, Bld: 148 mg/dL — ABNORMAL HIGH (ref 70–99)
Potassium: 4 mmol/L (ref 3.5–5.1)
Sodium: 140 mmol/L (ref 135–145)
Total Bilirubin: 0.6 mg/dL (ref 0.3–1.2)
Total Protein: 5.5 g/dL — ABNORMAL LOW (ref 6.5–8.1)

## 2019-03-25 LAB — CBC
HCT: 37.7 % — ABNORMAL LOW (ref 39.0–52.0)
Hemoglobin: 12.6 g/dL — ABNORMAL LOW (ref 13.0–17.0)
MCH: 27.9 pg (ref 26.0–34.0)
MCHC: 33.4 g/dL (ref 30.0–36.0)
MCV: 83.4 fL (ref 80.0–100.0)
Platelets: 175 10*3/uL (ref 150–400)
RBC: 4.52 MIL/uL (ref 4.22–5.81)
RDW: 13.5 % (ref 11.5–15.5)
WBC: 7 10*3/uL (ref 4.0–10.5)
nRBC: 0 % (ref 0.0–0.2)

## 2019-03-25 NOTE — Discharge Instructions (Signed)

## 2019-03-25 NOTE — Telephone Encounter (Signed)
Pt and spouse called in to be advised. Pt was just released from the hospital. Spouse says that the hospital made some changes to pt's medications. They are not sure of what pt should be taking now?   Please advise.CB: [phone]

## 2019-03-25 NOTE — Telephone Encounter (Signed)
Called back spoke with spouse.  Scheduled appt on Monday for hospital fu.

## 2019-03-25 NOTE — Progress Notes (Signed)
Pt ate a regular breakfast this am and states is having no pain following eating.  He states this is the first time he has really eaten in 6 weeks and is very pleased.  Nominal surgical pain relieved with tylenol and oxy 5mg .

## 2019-03-25 NOTE — Discharge Summary (Signed)
Physician Discharge Summary  Darren Black VHQ:[id number] DOB: 09-Mar-1966 DOA: 03/22/2019  PCP: Corwin Levins, MD  Admit date: 03/22/2019 Discharge date: 03/25/2019  Admitted From:home Disposition:  home   Recommendations for Outpatient Follow-up:  1. Continue to encourage weight loss    Discharge Condition:  stable   CODE STATUS:  Full code   Diet recommendation:  Low fat, heart healthy diet Consultations:  General surgery    Discharge Diagnoses:  Principal Problem:   Cholecystitis Active Problems:   AKI (acute kidney injury) (HCC)   Hypotension   Hyperlipidemia   Anxiety state   Obstructive sleep apnea   Essential hypertension   Morbid obesity (HCC)   CKD (chronic kidney disease) stage 3, GFR 30-59 ml/min (HCC)     Brief Summary: 53 year old married male with PMH of anxiety, depression, BPH, chronic low back pain, HLD, HTN, OSA on CPAP, obesity, appendectomy, has had a complicated 6 weeks course with mostly postprandial diffuse and RUQ abdominal pain, nausea, poor oral intake, reported ~40 pound weight loss, extensively evaluated outpatient by PCP/GI (Dr. Elnoria Howard) including RUQ ultrasound CT abdomen and pelvis 02/05/2019, RUQ ultrasound 7/6, HIDA scan 03/07/2019, outpatient EGD without remarkable findings for etiology, no clear etiology found, no significant relief, supposed to see general surgery as outpatient, then started having diarrhea 3 days back, presented to the ED with generalized weakness, dizziness, lightheadedness and near syncope.  In ED hypotensive 81/69, dry mucosa.  Admitted for hypotension secondary to dehydration from poor oral intake, acute kidney injury, intractable abdominal pain, nausea and poor oral intake.  GI/Dr. Elnoria Howard and general surgery consulted.  General surgery suspect symptomatic gallbladder disease and plan laparoscopic cholecystectomy 7/23.   Hospital Course:  Intractable abdominal pain/ nausea and weight loss Probable cholecystitis - the patient has  had extensive work up in regards to the etiology but unfortunately no etiology was found - CT abdomen and pelvis with contrast 02/05/2019: No acute intra-abdominal process.  Circumferential bladder wall thickening with multiple diverticuli, likely related to chronic outlet obstruction.  Indeterminate 1.9 cm right adrenal nodule, probably benign, needs follow-up. - RUQ ultrasound 02/05/2019: Possible hepatic steatosis without focal liver lesions - RUQ ultrasound 03/07/2019: No acute findings or cholecystitis or bile duct dilatation.  Possible early or developing cirrhosis. - HIDA scan 03/07/2019: Patent biliary tree with normal gallbladder EF 67%. - CT abdomen and pelvis without contrast 7/22: No pancreatitis of biliary calculus.  Chronic urinary outlet obstruction with bladder diverticuli. - EGD by Dr. Elnoria Howard as outpatient reportedly unremarkable. - Dr Waymon Amato spoke with Dr Elnoria Howard in regards to further management and was recommended to get a  general surgery consultation - [facility] Indicated that his findings are consistent with symptomatic gallbladder disease and the patient underwent a lap cholecystectomy on 7/23 - today he is pain free after eating solid meals- he is stable for discharge and will f/u with general surgery in 2 wks  Hypotension/ dehydration and AKI on CKD3 - noted on admission- has resolved with IV hydration and correction of underlying problem  Near syncope - suspected to be due to hypotension  Hypokalemia - replaced  BPH - cont Flomax  Indeterminate right renal nodule - 1.9 cm - noted incidentally on CT - needs outpt follow up  Essential HTN - cont Amlodipine, Cardura and Benazepril  Anxiety/ depression - cont Cymbalta and PRN Lorazepam  OSA  - cont CPAP  Morbid obesity Body mass index is 40.78 kg/m.   Discharge Exam: Vitals:   03/25/19 0435 03/25/19 1112  BP: Marland Kitchen)  147/97 (!) 142/92  Pulse: (!) 57 69  Resp: 18 18  Temp: 98.3 F (36.8 C) 98.5 F (36.9 C)  SpO2: 98%  98%   Vitals:   03/24/19 2138 03/25/19 0032 03/25/19 0435 03/25/19 1112  BP:  (!) 143/92 (!) 147/97 (!) 142/92  Pulse:  72 (!) 57 69  Resp:  18 18 18   Temp:  98.4 F (36.9 C) 98.3 F (36.8 C) 98.5 F (36.9 C)  TempSrc:  Oral Oral Oral  SpO2: 100% 98% 98% 98%  Weight:      Height:        General: Pt is alert, awake, not in acute distress Cardiovascular: RRR, S1/S2 +, no rubs, no gallops Respiratory: CTA bilaterally, no wheezing, no rhonchi Abdominal: Soft, NT, ND, bowel sounds + Extremities: no edema, no cyanosis   Discharge Instructions  Discharge Instructions    Diet general   Complete by: As directed    Please take a low fat, low salt diet   Increase activity slowly   Complete by: As directed      Allergies as of 03/25/2019      Reactions   Crestor [rosuvastatin Calcium] Other (See Comments)   abd pain      Medication List    STOP taking these medications   pravastatin 80 MG tablet Commonly known as: PRAVACHOL     TAKE these medications   amLODipine 5 MG tablet Commonly known as: NORVASC Take 1 tablet by mouth once daily What changed:   when to take this  reasons to take this   benazepril 40 MG tablet Commonly known as: LOTENSIN Take 1 tablet (40 mg total) by mouth daily.   doxazosin 4 MG tablet Commonly known as: CARDURA Take 1 tablet (4 mg total) by mouth at bedtime.   DULoxetine 30 MG capsule Commonly known as: CYMBALTA Take 1 capsule (30 mg total) by mouth daily.   LORazepam 1 MG tablet Commonly known as: ATIVAN Take 1 tablet by mouth twice daily as needed for anxiety   oxyCODONE-acetaminophen 5-325 MG tablet Commonly known as: PERCOCET/ROXICET Take 1 tablet by mouth daily as needed for severe pain.   pantoprazole 40 MG tablet Commonly known as: PROTONIX Take 1 tablet (40 mg total) by mouth 2 (two) times daily before a meal.   rosuvastatin 20 MG tablet Commonly known as: Crestor Take 1 tablet (20 mg total) by mouth daily.    sucralfate 1 g tablet Commonly known as: Carafate Take 1 tablet (1 g total) by mouth 4 (four) times daily -  with meals and at bedtime.   tamsulosin 0.4 MG Caps capsule Commonly known as: FLOMAX Take 1 capsule (0.4 mg total) by mouth daily.   testosterone 50 MG/5GM (1%) Gel Commonly known as: ANDROGEL Place 5 g onto the skin daily.   triamcinolone 55 MCG/ACT Aero nasal inhaler Commonly known as: NASACORT Place 2 sprays into the nose daily.      Follow-up Information    [facility] Follow up on 04/05/2019.   Specialty: General Surgery Why: 08/04 at 11:30am, a provider will call you for your follow up telehealth appointment. please take a photo of your incisions and send them to [email or web address] 2 days prior and include your name and DOB in the subject line.  Contact information: [address] [place] [id number] [phone]         Allergies  Allergen Reactions  . Crestor [Rosuvastatin Calcium] Other (See Comments)    abd  pain     Procedures/Studies:    Ct Abdomen Pelvis Wo Contrast  Result Date: 03/23/2019 CLINICAL DATA:  Pancreatitis EXAM: CT ABDOMEN AND PELVIS WITHOUT CONTRAST TECHNIQUE: Multidetector CT imaging of the abdomen and pelvis was performed following the standard protocol without IV contrast. COMPARISON:  02/05/2019 FINDINGS: Lower chest:  No contributory findings. Hepatobiliary: No focal liver abnormality.No evidence of biliary obstruction or stone. Pancreas: Unremarkable. Spleen: Unremarkable. Adrenals/Urinary Tract: 12 mm right adrenal nodule, stable and again most likely an adenoma. No hydronephrosis or stone. Chronic circumferentially thick walled bladder with diverticula. There is history of BPH and urethral dilatation. Stomach/Bowel: No obstruction. Appendectomy with small residual. Mild left colonic diverticulosis. Tiny low densities in the lower pole right kidney too small for  densitometry. Left renal cystic density. Vascular/Lymphatic: No acute vascular abnormality. Atherosclerotic calcifications. No mass or adenopathy. Reproductive:Nonspecific prostatic calcification. Seminal vesicles displaced to the right by bladder diverticulum. Other: No ascites or pneumoperitoneum.  Small fatty umbilical hernia Musculoskeletal: No acute abnormalities. IMPRESSION: 1. No visible pancreatitis or biliary calculus. 2. Chronic urinary outlet obstruction with bladder diverticula. 3. Stable from CT last month. Electronically Signed   By: Monte Fantasia M.D.   On: 03/23/2019 04:11   Dg Chest 2 View  Result Date: 03/15/2019 CLINICAL DATA:  Weight loss, no chest symptoms, right-sided abdominal pain for 5-6 weeks. EXAM: CHEST - 2 VIEW COMPARISON:  Calcium scoring CT 08/03/2018. FINDINGS: No consolidation, features of edema, pneumothorax, or effusion. Cardiomediastinal contours are unremarkable. No acute osseous or soft tissue abnormality. IMPRESSION: No acute cardiopulmonary abnormality. Electronically Signed   By: Lovena Le M.D.   On: 03/15/2019 21:57   Dg Cholangiogram Operative  Result Date: 03/25/2019 CLINICAL DATA:  53 year old male with a history cholelithiasis EXAM: INTRAOPERATIVE CHOLANGIOGRAM TECHNIQUE: Cholangiographic images from the C-arm fluoroscopic device were submitted for interpretation post-operatively. Please see the procedural report for the amount of contrast and the fluoroscopy time utilized. COMPARISON:  CT 03/23/2019 FINDINGS: Surgical instruments project over the upper abdomen. There is cannulation of the cystic duct/gallbladder neck, with antegrade infusion of contrast. Caliber of the extrahepatic ductal system within normal limits. No definite filling defect within the extrahepatic ducts identified. Free flow of contrast across the ampulla. IMPRESSION: Intraoperative cholangiogram demonstrates extrahepatic biliary ducts of unremarkable caliber, with no definite filling  defects identified. Free flow of contrast across the ampulla. Please refer to the dictated operative report for full details of intraoperative findings and procedure Electronically Signed   By: Corrie Mckusick D.O.   On: 03/25/2019 07:36   Nm Hepato W/eject Fract  Result Date: 03/07/2019 CLINICAL DATA:  RIGHT upper quadrant pain for 4 weeks EXAM: NUCLEAR MEDICINE HEPATOBILIARY IMAGING WITH GALLBLADDER EF TECHNIQUE: Sequential images of the abdomen were obtained out to 60 minutes following intravenous administration of radiopharmaceutical. After oral ingestion of Ensure, gallbladder ejection fraction was determined. At 60 min, normal ejection fraction is greater than 33%. RADIOPHARMACEUTICALS:  5.2 mCi Tc-59m  Choletec IV COMPARISON:  None FINDINGS: Normal tracer extraction from bloodstream indicating normal hepatocellular function. Normal excretion of tracer into biliary tree. Gallbladder visualized at 9 min. Small bowel visualized at 17 min. No hepatic retention of tracer. Subjectively normal emptying of tracer from gallbladder following fatty meal stimulation. Calculated gallbladder ejection fraction is 67%, normal. Patient reported RIGHT upper quadrant pain following Ensure ingestion. Normal gallbladder ejection fraction following Ensure ingestion is greater than 33% at 1 hour. IMPRESSION: Patent biliary tree with normal gallbladder ejection fraction of 67% following fatty meal stimulation. Patient reported  RIGHT upper quadrant pain post ingestion of Ensure. Electronically Signed   By: Ulyses Southward M.D.   On: 03/07/2019 14:47   US Abdomen Limited Ruq  Result Date: 03/07/2019 CLINICAL DATA:  RIGHT upper quadrant abdominal pain for 4 weeks. EXAM: ULTRASOUND ABDOMEN LIMITED RIGHT UPPER QUADRANT COMPARISON:  None. FINDINGS: Gallbladder: No gallstones or wall thickening visualized. Incidental note made of a 4 mm gallbladder wall polyp. No sonographic Murphy sign noted by sonographer. Common bile duct: Diameter: 3  mm Liver: No focal lesion identified. Within normal limits in parenchymal echogenicity. Echotexture is slightly coarsened raising the possibility of underlying cirrhosis. Portal vein is patent on color Doppler imaging with normal direction of blood flow towards the liver. IMPRESSION: 1. No acute findings. No evidence of cholecystitis. No bile duct dilatation. 2. Liver echotexture is slightly coarsened raising the possibility of early/developing cirrhosis. Electronically Signed   By: Bary Richard M.D.   On: 03/07/2019 08:46     The results of significant diagnostics from this hospitalization (including imaging, microbiology, ancillary and laboratory) are listed below for reference.     Microbiology: Recent Results (from the past 240 hour(s))  SARS Coronavirus 2 (CEPHEID - Performed in [facility] hospital lab), Hosp Order     Status: None   Collection Time: 03/23/19  5:07 AM   Specimen: Nasopharyngeal Swab  Result Value Ref Range Status   SARS Coronavirus 2 NEGATIVE NEGATIVE Final    Comment: (NOTE) If result is NEGATIVE SARS-CoV-2 target nucleic acids are NOT DETECTED. The SARS-CoV-2 RNA is generally detectable in upper and lower  respiratory specimens during the acute phase of infection. The lowest  concentration of SARS-CoV-2 viral copies this assay can detect is 250  copies / mL. A negative result does not preclude SARS-CoV-2 infection  and should not be used as the sole basis for treatment or other  patient management decisions.  A negative result may occur with  improper specimen collection / handling, submission of specimen other  than nasopharyngeal swab, presence of viral mutation(s) within the  areas targeted by this assay, and inadequate number of viral copies  (<250 copies / mL). A negative result must be combined with clinical  observations, patient history, and epidemiological information. If result is POSITIVE SARS-CoV-2 target nucleic acids are DETECTED. The SARS-CoV-2  RNA is generally detectable in upper and lower  respiratory specimens dur ing the acute phase of infection.  Positive  results are indicative of active infection with SARS-CoV-2.  Clinical  correlation with patient history and other diagnostic information is  necessary to determine patient infection status.  Positive results do  not rule out bacterial infection or co-infection with other viruses. If result is PRESUMPTIVE POSTIVE SARS-CoV-2 nucleic acids MAY BE PRESENT.   A presumptive positive result was obtained on the submitted specimen  and confirmed on repeat testing.  While 2019 novel coronavirus  (SARS-CoV-2) nucleic acids may be present in the submitted sample  additional confirmatory testing may be necessary for epidemiological  and / or clinical management purposes  to differentiate between  SARS-CoV-2 and other Sarbecovirus currently known to infect humans.  If clinically indicated additional testing with an alternate test  methodology [phone]) is advised. The SARS-CoV-2 RNA is generally  detectable in upper and lower respiratory sp ecimens during the acute  phase of infection. The expected result is Negative. Fact Sheet for Patients:  BoilerBrush.com.cy Fact Sheet for Healthcare Providers: https://pope.com/ This test is not yet approved or cleared by the [place] FDA and  has been authorized for detection and/or diagnosis of SARS-CoV-2 by FDA under an Emergency Use Authorization (EUA).  This EUA will remain in effect (meaning this test can be used) for the duration of the COVID-19 declaration under Section 564(b)(1) of the Act, 21 U.S.C. section 360bbb-3(b)(1), unless the authorization is terminated or revoked sooner. Performed at [facility], [address] [place], [place] [address]   Surgical pcr screen     Status: None   Collection Time: 03/24/19 11:23 AM   Specimen: Nasal Mucosa; Nasal Swab  Result Value  Ref Range Status   MRSA, PCR NEGATIVE NEGATIVE Final   Staphylococcus aureus NEGATIVE NEGATIVE Final    Comment: (NOTE) The Xpert SA Assay (FDA approved for NASAL specimens in patients 52 years of age and older), is one component of a comprehensive surveillance program. It is not intended to diagnose infection nor to guide or monitor treatment. Performed at [facility], [address] [place], [place] [address]      Labs: BNP (last 3 results) No results for input(s): BNP in the last 8760 hours. Basic Metabolic Panel: Recent Labs  Lab 03/22/19 2328 03/23/19 0057 03/24/19 0222 03/25/19 0314  NA 137 138 142 140  K 3.0* 3.6 4.9 4.0  CL 100  --  112* 108  CO2 21*  --  22 24  GLUCOSE 100*  --  119* 148*  BUN 8  --  6 <5*  CREATININE 1.84*  --  1.45* 1.44*  CALCIUM 9.5  --  8.5* 8.9   Liver Function Tests: Recent Labs  Lab 03/22/19 2328 03/25/19 0314  AST 19 61*  ALT 22 51*  ALKPHOS 43 38  BILITOT 1.3* 0.6  PROT 7.0 5.5*  ALBUMIN 4.2 3.2*   Recent Labs  Lab 03/22/19 2328  LIPASE 73*   No results for input(s): AMMONIA in the last 168 hours. CBC: Recent Labs  Lab 03/22/19 2328 03/23/19 0057 03/24/19 0222 03/25/19 0314  WBC 6.5  --  4.6 7.0  NEUTROABS 4.0  --   --   --   HGB 15.3 14.6 12.0* 12.6*  HCT 44.4 43.0 35.4* 37.7*  MCV 80.4  --  81.8 83.4  PLT 248  --  168 175   Cardiac Enzymes: No results for input(s): CKTOTAL, CKMB, CKMBINDEX, TROPONINI in the last 168 hours. BNP: Invalid input(s): POCBNP CBG: Recent Labs  Lab 03/22/19 2302  GLUCAP 106*   D-Dimer No results for input(s): DDIMER in the last 72 hours. Hgb A1c No results for input(s): HGBA1C in the last 72 hours. Lipid Profile No results for input(s): CHOL, HDL, LDLCALC, TRIG, CHOLHDL, LDLDIRECT in the last 72 hours. Thyroid function studies No results for input(s): TSH, T4TOTAL, T3FREE, THYROIDAB in the last 72 hours.  Invalid input(s): FREET3 Anemia work up No results for  input(s): VITAMINB12, FOLATE, FERRITIN, TIBC, IRON, RETICCTPCT in the last 72 hours. Urinalysis    Component Value Date/Time   COLORURINE YELLOW 03/23/2019 0150   APPEARANCEUR HAZY (A) 03/23/2019 0150   LABSPEC 1.006 03/23/2019 0150   PHURINE 6.0 03/23/2019 0150   GLUCOSEU NEGATIVE 03/23/2019 0150   GLUCOSEU NEGATIVE 01/12/2019 1411   HGBUR SMALL (A) 03/23/2019 0150   BILIRUBINUR NEGATIVE 03/23/2019 0150   KETONESUR 20 (A) 03/23/2019 0150   PROTEINUR NEGATIVE 03/23/2019 0150   UROBILINOGEN 0.2 01/12/2019 1411   NITRITE NEGATIVE 03/23/2019 0150   LEUKOCYTESUR MODERATE (A) 03/23/2019 0150   Sepsis Labs Invalid input(s): PROCALCITONIN,  WBC,  LACTICIDVEN Microbiology Recent  Results (from the past 240 hour(s))  SARS Coronavirus 2 (CEPHEID - Performed in [facility] hospital lab), Hosp Order     Status: None   Collection Time: 03/23/19  5:07 AM   Specimen: Nasopharyngeal Swab  Result Value Ref Range Status   SARS Coronavirus 2 NEGATIVE NEGATIVE Final    Comment: (NOTE) If result is NEGATIVE SARS-CoV-2 target nucleic acids are NOT DETECTED. The SARS-CoV-2 RNA is generally detectable in upper and lower  respiratory specimens during the acute phase of infection. The lowest  concentration of SARS-CoV-2 viral copies this assay can detect is 250  copies / mL. A negative result does not preclude SARS-CoV-2 infection  and should not be used as the sole basis for treatment or other  patient management decisions.  A negative result may occur with  improper specimen collection / handling, submission of specimen other  than nasopharyngeal swab, presence of viral mutation(s) within the  areas targeted by this assay, and inadequate number of viral copies  (<250 copies / mL). A negative result must be combined with clinical  observations, patient history, and epidemiological information. If result is POSITIVE SARS-CoV-2 target nucleic acids are DETECTED. The SARS-CoV-2 RNA is generally  detectable in upper and lower  respiratory specimens dur ing the acute phase of infection.  Positive  results are indicative of active infection with SARS-CoV-2.  Clinical  correlation with patient history and other diagnostic information is  necessary to determine patient infection status.  Positive results do  not rule out bacterial infection or co-infection with other viruses. If result is PRESUMPTIVE POSTIVE SARS-CoV-2 nucleic acids MAY BE PRESENT.   A presumptive positive result was obtained on the submitted specimen  and confirmed on repeat testing.  While 2019 novel coronavirus  (SARS-CoV-2) nucleic acids may be present in the submitted sample  additional confirmatory testing may be necessary for epidemiological  and / or clinical management purposes  to differentiate between  SARS-CoV-2 and other Sarbecovirus currently known to infect humans.  If clinically indicated additional testing with an alternate test  methodology [phone]) is advised. The SARS-CoV-2 RNA is generally  detectable in upper and lower respiratory sp ecimens during the acute  phase of infection. The expected result is Negative. Fact Sheet for Patients:  BoilerBrush.com.cyhttps://www.fda.gov/media/136312/download Fact Sheet for Healthcare Providers: https://pope.com/https://www.fda.gov/media/136313/download This test is not yet approved or cleared by the [place] FDA and has been authorized for detection and/or diagnosis of SARS-CoV-2 by FDA under an Emergency Use Authorization (EUA).  This EUA will remain in effect (meaning this test can be used) for the duration of the COVID-19 declaration under Section 564(b)(1) of the Act, 21 U.S.C. section 360bbb-3(b)(1), unless the authorization is terminated or revoked sooner. Performed at [facility], [address] [place], [place] [address]   Surgical pcr screen     Status: None   Collection Time: 03/24/19 11:23 AM   Specimen: Nasal Mucosa; Nasal Swab  Result Value Ref Range Status    MRSA, PCR NEGATIVE NEGATIVE Final   Staphylococcus aureus NEGATIVE NEGATIVE Final    Comment: (NOTE) The Xpert SA Assay (FDA approved for NASAL specimens in patients 52 years of age and older), is one component of a comprehensive surveillance program. It is not intended to diagnose infection nor to guide or monitor treatment. Performed at [facility], [address] [place], [place] [address]      Time coordinating discharge in minutes: 60  SIGNED:   Calvert CantorSaima Berdena Cisek, MD  [facility]  03/25/2019, 12:19 PM Pager   If 7PM-7AM, please contact night-coverage [email or web address] Password [email or web address]

## 2019-03-25 NOTE — Progress Notes (Signed)
[facility]/Trauma Progress Note  1 Day Post-Op   Assessment/Plan  Chronic cholecystitis - S/P lap chole with IOC, Dr. Georgette Dover, 07/23 - pt is okay for discharge from our standpoint  FEN: reg diet VTE: SCD's, lovenox ID: Ancef preop Follow up: [facility] 2 weeks    LOS: 2 days    Subjective: CC: abdominal soreness  Pt tolerated breakfast without pain. Darren is thrilled. Darren is feeling much better. Darren is sore but no overt abdominal pain. Darren Black.   Objective: Vital signs in last 24 hours: Temp:  [97.7 F (36.5 C)-98.4 F (36.9 C)] 98.3 F (36.8 C) (07/24 0435) Pulse Rate:  [57-80] 57 (07/24 0435) Resp:  [12-20] 18 (07/24 0435) BP: (109-166)/(66-101) 147/97 (07/24 0435) SpO2:  [96 %-100 %] 98 % (07/24 0435) FiO2 (%):  [21 %] 21 % (07/23 2138) Weight:  [118.1 kg] 118.1 kg (07/23 1510) Last BM Date: 03/24/19  Intake/Output from previous day: 07/23 0701 - 07/24 0700 In: 2915.2 [P.O.:730; I.V.:2185.2] Out: 325 [Urine:325] Intake/Output this shift: No intake/output data recorded.  PE: Gen:  Alert, NAD, pleasant, cooperative Pulm:  Rate and effort normal Abd: Soft, ND, +BS, no HSM, incisions C/D/I, mild generalized TTP without guarding. No peritonitis  Skin: no rashes noted, warm and dry   Anti-infectives: Anti-infectives (From admission, onward)   Start     Dose/Rate Route Frequency Ordered Stop   03/24/19 1445  ceFAZolin (ANCEF) IVPB 2g/100 mL premix     2 g 200 mL/hr over 30 Minutes Intravenous  Once 03/24/19 1430 03/24/19 1530   03/24/19 1430  ceFAZolin (ANCEF) IVPB 2g/100 mL premix  Status:  Discontinued     2 g 200 mL/hr over 30 Minutes Intravenous  Once 03/24/19 1421 03/24/19 1430      Lab Results:  Recent Labs    03/24/19 0222 03/25/19 0314  WBC 4.6 7.0  HGB 12.0* 12.6*  HCT 35.4* 37.7*  PLT 168 175   BMET Recent Labs    03/24/19 0222 03/25/19 0314  NA 142 140  K 4.9 4.0  CL 112* 108  CO2 22 24  GLUCOSE 119* 148*   BUN 6 <5*  CREATININE 1.45* 1.44*  CALCIUM 8.5* 8.9   PT/INR No results for input(s): LABPROT, INR in the last 72 hours. CMP     Component Value Date/Time   NA 140 03/25/2019 0314   K 4.0 03/25/2019 0314   CL 108 03/25/2019 0314   CO2 24 03/25/2019 0314   GLUCOSE 148 (H) 03/25/2019 0314   BUN <5 (L) 03/25/2019 0314   CREATININE 1.44 (H) 03/25/2019 0314   CALCIUM 8.9 03/25/2019 0314   PROT 5.5 (L) 03/25/2019 0314   ALBUMIN 3.2 (L) 03/25/2019 0314   AST 61 (H) 03/25/2019 0314   ALT 51 (H) 03/25/2019 0314   ALKPHOS 38 03/25/2019 0314   BILITOT 0.6 03/25/2019 0314   GFRNONAA 55 (L) 03/25/2019 0314   GFRAA >60 03/25/2019 0314   Lipase     Component Value Date/Time   LIPASE 73 (H) 03/22/2019 2328    Studies/Results: Dg Cholangiogram Operative  Result Date: 03/25/2019 CLINICAL DATA:  53 year old male with a history cholelithiasis EXAM: INTRAOPERATIVE CHOLANGIOGRAM TECHNIQUE: Cholangiographic images from the C-arm fluoroscopic device were submitted for interpretation post-operatively. Please see the procedural report for the amount of contrast and the fluoroscopy time utilized. COMPARISON:  CT 03/23/2019 FINDINGS: Surgical instruments project over the upper abdomen. There is cannulation of the cystic duct/gallbladder neck, with antegrade infusion of contrast.  Caliber of the extrahepatic ductal system within normal limits. No definite filling defect within the extrahepatic ducts identified. Free flow of contrast across the ampulla. IMPRESSION: Intraoperative cholangiogram demonstrates extrahepatic biliary ducts of unremarkable caliber, with no definite filling defects identified. Free flow of contrast across the ampulla. Please refer to the dictated operative report for full details of intraoperative findings and procedure Electronically Signed   By: Gilmer MorJaime  Wagner D.O.   On: 03/25/2019 07:36      Jerre SimonJessica L Lynora Dymond , [facility] 03/25/2019, 9:52 AM  Pager:  [phone] Mon-Wed, Friday 7:00am-4:30pm Thurs 7am-11:30am  Consults: [phone]

## 2019-03-25 NOTE — Progress Notes (Signed)
Pt discharged to home with wife. Reviewed DC instructions with pt and wife and follow up appointments reviewed.  Pt is comfortable from pain and ready to go home.

## 2019-03-25 NOTE — Telephone Encounter (Signed)
Patient was discharged today from the hospital.  Spouse states there in confusion as to what blood pressure medications he is supposed to be taking right now.  I have scheduled patient for a doxy hospital fu on Monday.   Spouse would like a call back today if possible.  I have also advised to call back to the hospital.

## 2019-03-28 ENCOUNTER — Encounter: Payer: Self-pay | Admitting: Internal Medicine

## 2019-03-28 ENCOUNTER — Ambulatory Visit (INDEPENDENT_AMBULATORY_CARE_PROVIDER_SITE_OTHER): Payer: Medicare HMO | Admitting: Internal Medicine

## 2019-03-28 DIAGNOSIS — K819 Cholecystitis, unspecified: Secondary | ICD-10-CM

## 2019-03-28 DIAGNOSIS — I959 Hypotension, unspecified: Secondary | ICD-10-CM | POA: Diagnosis not present

## 2019-03-28 DIAGNOSIS — R739 Hyperglycemia, unspecified: Secondary | ICD-10-CM | POA: Diagnosis not present

## 2019-03-28 DIAGNOSIS — E876 Hypokalemia: Secondary | ICD-10-CM | POA: Diagnosis not present

## 2019-03-28 NOTE — Progress Notes (Signed)
Patient ID: Darren Black, male   DOB: [birth year], 53 y.o.   MRN: [id number]  Virtual Visit via Video Note  I connected with Darren Black on 03/28/19 at  2:00 PM EDT by a video enabled telemedicine application and verified that I am speaking with the correct person using two identifiers.  Location: Patient: at home Provider: at office   I discussed the limitations of evaluation and management by telemedicine and the availability of in person appointments. The patient expressed understanding and agreed to proceed.  History of Present Illness: Here to f/u recent CCX, c/o persistent fatigue postop.  Denies worsening reflux, abd pain, dysphagia, n/v, bowel change or blood, asks to stop the carafate and PPI.  Had some recent constipation, but then miralax and peptobismol caused diarrhea.  Then seemed to have thirst and mild elevated HR to the low 100's. BP also relatively low normal on current CCB.ACE and qhs cardura as prior to surgury    Asking if needs more oral fluids.  Did have mild recent low K, with last K improved to 4/0, wife asking for recheck as well.  Pt denies chest pain, increased sob or doe, wheezing, orthopnea, PND, increased LE swelling, palpitations, dizziness or syncope.  Pt denies new neurological symptoms such as new headache, or facial or extremity weakness or numbness   Pt denies polydipsia, polyuria,.   Past Medical History:  Diagnosis Date  . Anxiety   . BPH (benign prostatic hypertrophy)   . Chronic lower back pain    nerve damage w/ leg pain  . Depression   . Diarrhea 03/23/2019  . Family history of adverse reaction to anesthesia    MOTHER--- HARD TO WAKE  . History of panic attacks   . Hyperlipidemia   . Hypertension   . Meatal stenosis   . OSA on CPAP    moderate to severe OSA  per study 04-28-2007 uses CPAP  . PONV (postoperative nausea and vomiting)    Past Surgical History:  Procedure Laterality Date  . APPENDECTOMY  2003  . BREATH TEK H PYLORI  04/27/2012    Procedure: BREATH TEK H PYLORI;  Surgeon: Valarie MerinoMatthew B Martin, MD;  [facility];  Service: General;  Laterality: N/A;  . CHOLECYSTECTOMY N/A 03/24/2019   Procedure: LAPAROSCOPIC CHOLECYSTECTOMY WITH INTRAOPERATIVE CHOLANGIOGRAM;  Surgeon: Darren Black, Matthew, MD;  [facility];  Service: General;  Laterality: N/A;  . CYSTOSCOPY WITH URETHRAL DILATATION N/A 04/12/2015   Procedure: CYSTOSCOPY WITH URETHRAL DILATATION, RETROGRADE AND URETHROGRAM WITH BLADDER BIOPSY;  Surgeon: Darren PippinJohn Wrenn, MD;  [facility];  Service: Urology;  Laterality: N/A;  . ROTATOR CUFF REPAIR Right 01-21-2012  . TRANSTHORACIC ECHOCARDIOGRAM  03-26-2007   normal LVSF, ef 60%,  mild AV calcification without stenosis,  mild MR and TR,  mild LAE  . URETHROGRAM N/A 04/12/2015   Procedure: URETHROGRAM;  Surgeon: Darren PippinJohn Wrenn, MD;  [facility] [place];  Service: Urology;  Laterality: N/A;    reports that he has never smoked. He has never used smokeless tobacco. He reports current alcohol use. He reports that he does not use drugs. family history includes Cancer in his father; Coronary artery disease in his father; Diabetes in an other family member; Hyperlipidemia in an other family member; Hypertension in an other family member; Stroke in an other family member. Allergies  Allergen Reactions  . Crestor [Rosuvastatin Calcium] Other (See Comments)    abd pain   Current Outpatient Medications on File Prior to Visit  Medication  Sig Dispense Refill  . amLODipine (NORVASC) 5 MG tablet Take 1 tablet by mouth once daily (Patient taking differently: Take 5 mg by mouth daily as needed (high blood pressure). ) 90 tablet 0  . benazepril (LOTENSIN) 40 MG tablet Take 1 tablet (40 mg total) by mouth daily. 90 tablet 3  . doxazosin (CARDURA) 4 MG tablet Take 1 tablet (4 mg total) by mouth at bedtime. 90 tablet 3  . DULoxetine (CYMBALTA) 30 MG capsule Take 1 capsule (30 mg total) by mouth daily. 90  capsule 3  . LORazepam (ATIVAN) 1 MG tablet Take 1 tablet by mouth twice daily as needed for anxiety 60 tablet 2  . oxyCODONE-acetaminophen (PERCOCET/ROXICET) 5-325 MG tablet Take 1 tablet by mouth daily as needed for severe pain. 30 tablet 0  . pantoprazole (PROTONIX) 40 MG tablet Take 1 tablet (40 mg total) by mouth 2 (two) times daily before a meal. (Patient not taking: Reported on 03/23/2019) 60 tablet 1  . rosuvastatin (CRESTOR) 20 MG tablet Take 1 tablet (20 mg total) by mouth daily. (Patient not taking: Reported on 03/23/2019) 90 tablet 3  . sucralfate (CARAFATE) 1 g tablet Take 1 tablet (1 g total) by mouth 4 (four) times daily -  with meals and at bedtime. 20 tablet 0  . tamsulosin (FLOMAX) 0.4 MG CAPS capsule Take 1 capsule (0.4 mg total) by mouth daily. 90 capsule 3  . testosterone (ANDROGEL) 50 MG/5GM (1%) GEL Place 5 g onto the skin daily. (Patient not taking: Reported on 03/23/2019) 450 g 1  . triamcinolone (NASACORT) 55 MCG/ACT AERO nasal inhaler Place 2 sprays into the nose daily. (Patient not taking: Reported on 03/23/2019) 1 Inhaler 12  . [DISCONTINUED] potassium chloride (KLOR-CON 10) 10 MEQ CR tablet Take 1 tablet (10 mEq total) by mouth daily. 90 tablet 3   No current facility-administered medications on file prior to visit.    Observations/Objective: Alert, NAD, appropriate mood and affect, resps normal, cn 2-12 intact, moves all 4s, no visible rash or swelling Lab Results  Component Value Date   WBC 7.0 03/25/2019   HGB 12.6 (L) 03/25/2019   HCT 37.7 (L) 03/25/2019   PLT 175 03/25/2019   GLUCOSE 148 (H) 03/25/2019   CHOL 235 (H) 01/12/2019   TRIG 148.0 01/12/2019   HDL 43.40 01/12/2019   LDLDIRECT 139.0 01/02/2016   LDLCALC 162 (H) 01/12/2019   ALT 51 (H) 03/25/2019   AST 61 (H) 03/25/2019   NA 140 03/25/2019   K 4.0 03/25/2019   CL 108 03/25/2019   CREATININE 1.44 (H) 03/25/2019   BUN <5 (L) 03/25/2019   CO2 24 03/25/2019   TSH 2.22 01/12/2019   PSA 0.97  01/12/2019   HGBA1C 5.8 01/12/2019   Assessment and Plan: See notes  Follow Up Instructions: See notes   I discussed the assessment and treatment plan with the patient. The patient was provided an opportunity to ask questions and all were answered. The patient agreed with the plan and demonstrated an understanding of the instructions.   The patient was advised to call back or seek an in-person evaluation if the symptoms worsen or if the condition fails to improve as anticipated.  Cathlean Cower, MD

## 2019-03-28 NOTE — Assessment & Plan Note (Signed)
With low normal BP at home per wife, then mild tachycardia after diarrhea several days, ok for pedialyte increased fluids

## 2019-03-28 NOTE — Patient Instructions (Signed)
Please continue all other medications as before, and refills have been done if requested.  Please have the pharmacy call with any other refills you may need.  Please continue your efforts at being more active, low cholesterol diet, and weight control.  You are otherwise up to date with prevention measures today.  Please keep your appointments with your specialists as you may have planned  Please go to the LAB in the Basement (turn left off the elevator) for the tests to be done as you can  You will be contacted by phone if any changes need to be made immediately.  Otherwise, you will receive a letter about your results with an explanation, but please check with MyChart first.  Please remember to sign up for MyChart if you have not done so, as this will be important to you in the future with finding out test results, communicating by private email, and scheduling acute appointments online when needed.

## 2019-03-28 NOTE — Assessment & Plan Note (Signed)
[place] for f/u BMP,  to f/u any worsening symptoms or concerns

## 2019-03-28 NOTE — Assessment & Plan Note (Signed)
stable overall by history and exam, recent data reviewed with pt, and pt to continue medical treatment as before,  to f/u any worsening symptoms or concerns  

## 2019-03-28 NOTE — Assessment & Plan Note (Signed)
S/p ccx, resolved, has mild postop fatigue that should improve at about 3-4 wks

## 2019-03-29 ENCOUNTER — Ambulatory Visit (INDEPENDENT_AMBULATORY_CARE_PROVIDER_SITE_OTHER): Payer: Medicare HMO | Admitting: Internal Medicine

## 2019-03-29 ENCOUNTER — Telehealth: Payer: Self-pay | Admitting: Internal Medicine

## 2019-03-29 ENCOUNTER — Encounter: Payer: Self-pay | Admitting: Internal Medicine

## 2019-03-29 ENCOUNTER — Ambulatory Visit: Payer: Self-pay | Admitting: *Deleted

## 2019-03-29 ENCOUNTER — Other Ambulatory Visit: Payer: Self-pay

## 2019-03-29 DIAGNOSIS — R000 Tachycardia, unspecified: Secondary | ICD-10-CM | POA: Diagnosis not present

## 2019-03-29 DIAGNOSIS — R197 Diarrhea, unspecified: Secondary | ICD-10-CM | POA: Diagnosis not present

## 2019-03-29 DIAGNOSIS — I959 Hypotension, unspecified: Secondary | ICD-10-CM

## 2019-03-29 DIAGNOSIS — F329 Major depressive disorder, single episode, unspecified: Secondary | ICD-10-CM

## 2019-03-29 DIAGNOSIS — R69 Illness, unspecified: Secondary | ICD-10-CM | POA: Diagnosis not present

## 2019-03-29 DIAGNOSIS — F32A Depression, unspecified: Secondary | ICD-10-CM

## 2019-03-29 MED ORDER — COLESTIPOL HCL 5 G PO GRAN: 5.0000 g | GRANULES | Freq: Two times a day (BID) | ORAL | 2 refills | Status: DC

## 2019-03-29 NOTE — Assessment & Plan Note (Addendum)
High suspicion for post cholecystectomy diarrhea (choloreic diarrhea); for bmp, lft, cbc labs in am; ok for 10 meq K he has at home and immodium tonight prn, but for colestipol 5 gm bid start in AM  Note:  Total time for pt hx, exam, review of record with pt in the room, determination of diagnoses and plan for further eval and tx is > 40 min, with over 50% spent in coordination and counseling of patient including the differential dx, tx, further evaluation and other management of diarrhea, hypotension, tachycardia, and depression

## 2019-03-29 NOTE — Patient Instructions (Signed)
Ok to take the potassium and immodium tonight  Continue the pedialyte for fluid replacement  Please take all new medication as prescribed - the colestipol  Please continue all other medications as before, and refills have been done if requested.  Please have the pharmacy call with any other refills you may need.  Please keep your appointments with your specialists as you may have planned  Please go to the LAB in the Basement (turn left off the elevator) for the tests to be done tomorrow  You will be contacted by phone if any changes need to be made immediately.  Otherwise, you will receive a letter about your results with an explanation, but please check with MyChart first.  Please remember to sign up for MyChart if you have not done so, as this will be important to you in the future with finding out test results, communicating by private email, and scheduling acute appointments online when needed.

## 2019-03-29 NOTE — Telephone Encounter (Signed)
[name] spouse called in to be advised. Pt was seen via virtual with PCP. Spouse says that pt's BP has been 102/81 pulse 125 and 108/82 pulse 118 today. pt has diarrhea. Spouse has been giving pt Pepto and Amodium for his stomach. Pt just got out of the hospital for dehydration.   Spouse would like to be advised because she says that she is concerned that he may get dehydrated again.   CB: [phone]: [phone]   Please assist .   Patient Pepto Bismol diarrhea - patient has Imodium if he needs to switch. Call to office to see if this can be reviewed as patient question or does patient need appointment? Visit scheduled Reason for Disposition . [1] MODERATE diarrhea (e.g., 4-6 times / day more than normal) AND [2] present > 48 hours (2 days)  Answer Assessment - Initial Assessment Questions 1. DIARRHEA SEVERITY: "How bad is the diarrhea?" "How many extra stools have you had in the past 24 hours than normal?"    - NO DIARRHEA (SCALE 0)   - MILD (SCALE 1-3): Few loose or mushy BMs; increase of 1-3 stools over normal daily number of stools; mild increase in ostomy output.   -  MODERATE (SCALE 4-7): Increase of 4-6 stools daily over normal; moderate increase in ostomy output. * SEVERE (SCALE 8-10; OR 'WORST POSSIBLE'): Increase of 7 or more stools daily over normal; moderate increase in ostomy output; incontinence.     moderate 2. ONSET: "When did the diarrhea begin?"      3 days 3. BM CONSISTENCY: "How loose or watery is the diarrhea?"      watery 4. VOMITING: "Are you also vomiting?" If so, ask: "How many times in the past 24 hours?"      no 5. ABDOMINAL PAIN: "Are you having any abdominal pain?" If yes: "What does it feel like?" (e.g., crampy, dull, intermittent, constant)      No- some gas 6. ABDOMINAL PAIN SEVERITY: If present, ask: "How bad is the pain?"  (e.g., Scale 1-10; mild, moderate, or severe)   - MILD (1-3): doesn't interfere with normal activities, abdomen soft and not tender to touch   - MODERATE (4-7): interferes with normal activities or awakens from sleep, tender to touch    - SEVERE (8-10): excruciating pain, doubled over, unable to do any normal activities       Gas pain only 7. ORAL INTAKE: If vomiting, "Have you been able to drink liquids?" "How much fluids have you had in the past 24 hours?"     18 oz Pedialyte and water , patient is able to keep foods down before passing them- bland diet 8. HYDRATION: "Any signs of dehydration?" (e.g., dry mouth [not just dry lips], too weak to stand, dizziness, new weight loss) "When did you last urinate?"     Dizzy some weakness- patient has been having trouble with eating 9. EXPOSURE: "Have you traveled to a foreign country recently?" "Have you been exposed to anyone with diarrhea?" "Could you have eaten any food that was spoiled?"     Post surgical 10. ANTIBIOTIC USE: "Are you taking antibiotics now or have you taken antibiotics in the past 2 months?"       Post surgical 11. OTHER SYMPTOMS: "Do you have any other symptoms?" (e.g., fever, blood in stool)       Increased heart rate 12. PREGNANCY: "Is there any chance you are pregnant?" "When was your last menstrual period?"       n/a  Protocols  used: DIARRHEA-A-AH

## 2019-03-29 NOTE — Assessment & Plan Note (Signed)
Suspect sinus rhythm, for contd increased po fluids and diarrhea volume loss control

## 2019-03-29 NOTE — Assessment & Plan Note (Signed)
Ok to hold the amlodipine 5 mg for now, cont all other tx, cont monitor BP

## 2019-03-29 NOTE — Assessment & Plan Note (Signed)
With situational worsening, with good wife support, no SI or HI, declines change in med tx or referral for counseling

## 2019-03-29 NOTE — Progress Notes (Signed)
Patient ID: Darren Black, male   DOB: [birth year], 53 y.o.   MRN: [id number]  Virtual Visit via Video Note  I connected with Darren Black on 03/29/19 at  7:00 PM EDT by a video enabled telemedicine application and verified that I am speaking with the correct person using two identifiers.  Location: Patient: at home Provider: at office   I discussed the limitations of evaluation and management by telemedicine and the availability of in person appointments. The patient expressed understanding and agreed to proceed.  History of Present Illness: Here with wife with c/o worsening rather large volume watery non bloody stool numerous times in past 2 days, now with BP even lower and HR increased more than that previous at last visit; wife reports ave seems to be 102/81 and HR 125.  Denies fever, chills, blood, abd pain, n/v but has been more dizzy as well, quite fatigued.  Pt denies chest pain, increased sob or doe, wheezing, orthopnea, PND, increased LE swelling, palpitations, or syncope.  Has ongoing anxiety but no panic, tonight states actually feels more depressed about everything, feels out of control, no SI or HI.  Has potassium and immodium left over.  Was not able to come to lab for K check due to diarrhea.  Denies worsening reflux, abd pain, dysphagia, n/v, or blood. Past Medical History:  Diagnosis Date  . Anxiety   . BPH (benign prostatic hypertrophy)   . Chronic lower back pain    nerve damage w/ leg pain  . Depression   . Diarrhea 03/23/2019  . Family history of adverse reaction to anesthesia    MOTHER--- HARD TO WAKE  . History of panic attacks   . Hyperlipidemia   . Hypertension   . Meatal stenosis   . OSA on CPAP    moderate to severe OSA  per study 04-28-2007 uses CPAP  . PONV (postoperative nausea and vomiting)    Past Surgical History:  Procedure Laterality Date  . APPENDECTOMY  2003  . BREATH TEK H PYLORI  04/27/2012   Procedure: BREATH TEK H PYLORI;  Surgeon: Pedro Earls, MD;  Location: [facility];  Service: General;  Laterality: N/A;  . CHOLECYSTECTOMY N/A 03/24/2019   Procedure: LAPAROSCOPIC CHOLECYSTECTOMY WITH INTRAOPERATIVE CHOLANGIOGRAM;  Surgeon: Donnie Mesa, MD;  Location: [facility];  Service: General;  Laterality: N/A;  . CYSTOSCOPY WITH URETHRAL DILATATION N/A 04/12/2015   Procedure: CYSTOSCOPY WITH URETHRAL DILATATION, RETROGRADE AND URETHROGRAM WITH BLADDER BIOPSY;  Surgeon: Irine Seal, MD;  Location: [facility];  Service: Urology;  Laterality: N/A;  . ROTATOR CUFF REPAIR Right 01-21-2012  . TRANSTHORACIC ECHOCARDIOGRAM  03-26-2007   normal LVSF, ef 60%,  mild AV calcification without stenosis,  mild MR and TR,  mild LAE  . URETHROGRAM N/A 04/12/2015   Procedure: URETHROGRAM;  Surgeon: Irine Seal, MD;  Location: [facility];  Service: Urology;  Laterality: N/A;    reports that he has never smoked. He has never used smokeless tobacco. He reports current alcohol use. He reports that he does not use drugs. family history includes Cancer in his father; Coronary artery disease in his father; Diabetes in an other family member; Hyperlipidemia in an other family member; Hypertension in an other family member; Stroke in an other family member. Allergies  Allergen Reactions  . Crestor [Rosuvastatin Calcium] Other (See Comments)    abd pain   Current Outpatient Medications on File Prior to Visit  Medication Sig Dispense Refill  . amLODipine (NORVASC) 5  MG tablet Take 1 tablet by mouth once daily (Patient taking differently: Take 5 mg by mouth daily as needed (high blood pressure). ) 90 tablet 0  . benazepril (LOTENSIN) 40 MG tablet Take 1 tablet (40 mg total) by mouth daily. 90 tablet 3  . doxazosin (CARDURA) 4 MG tablet Take 1 tablet (4 mg total) by mouth at bedtime. 90 tablet 3  . DULoxetine (CYMBALTA) 30 MG capsule Take 1 capsule (30 mg total) by mouth daily. 90 capsule 3  . LORazepam (ATIVAN) 1 MG tablet Take 1 tablet  by mouth twice daily as needed for anxiety 60 tablet 2  . oxyCODONE-acetaminophen (PERCOCET/ROXICET) 5-325 MG tablet Take 1 tablet by mouth daily as needed for severe pain. 30 tablet 0  . pantoprazole (PROTONIX) 40 MG tablet Take 1 tablet (40 mg total) by mouth 2 (two) times daily before a meal. (Patient not taking: Reported on 03/23/2019) 60 tablet 1  . rosuvastatin (CRESTOR) 20 MG tablet Take 1 tablet (20 mg total) by mouth daily. (Patient not taking: Reported on 03/23/2019) 90 tablet 3  . sucralfate (CARAFATE) 1 g tablet Take 1 tablet (1 g total) by mouth 4 (four) times daily -  with meals and at bedtime. 20 tablet 0  . tamsulosin (FLOMAX) 0.4 MG CAPS capsule Take 1 capsule (0.4 mg total) by mouth daily. 90 capsule 3  . testosterone (ANDROGEL) 50 MG/5GM (1%) GEL Place 5 g onto the skin daily. (Patient not taking: Reported on 03/23/2019) 450 g 1  . triamcinolone (NASACORT) 55 MCG/ACT AERO nasal inhaler Place 2 sprays into the nose daily. (Patient not taking: Reported on 03/23/2019) 1 Inhaler 12  . [DISCONTINUED] potassium chloride (KLOR-CON 10) 10 MEQ CR tablet Take 1 tablet (10 mEq total) by mouth daily. 90 tablet 3   No current facility-administered medications on file prior to visit.     Observations/Objective: Alert, NAD, depressed mood and affect, resps normal, cn 2-12 intact, moves all 4s, no visible rash or swelling Lab Results  Component Value Date   WBC 7.0 03/25/2019   HGB 12.6 (L) 03/25/2019   HCT 37.7 (L) 03/25/2019   PLT 175 03/25/2019   GLUCOSE 148 (H) 03/25/2019   CHOL 235 (H) 01/12/2019   TRIG 148.0 01/12/2019   HDL 43.40 01/12/2019   LDLDIRECT 139.0 01/02/2016   LDLCALC 162 (H) 01/12/2019   ALT 51 (H) 03/25/2019   AST 61 (H) 03/25/2019   NA 140 03/25/2019   K 4.0 03/25/2019   CL 108 03/25/2019   CREATININE 1.44 (H) 03/25/2019   BUN <5 (L) 03/25/2019   CO2 24 03/25/2019   TSH 2.22 01/12/2019   PSA 0.97 01/12/2019   HGBA1C 5.8 01/12/2019   Assessment and Plan: See  notes  Follow Up Instructions: See notes   I discussed the assessment and treatment plan with the patient. The patient was provided an opportunity to ask questions and all were answered. The patient agreed with the plan and demonstrated an understanding of the instructions.   The patient was advised to call back or seek an in-person evaluation if the symptoms worsen or if the condition fails to improve as anticipated.  Oliver BarreJames Torren, MD

## 2019-03-30 ENCOUNTER — Other Ambulatory Visit (INDEPENDENT_AMBULATORY_CARE_PROVIDER_SITE_OTHER): Payer: Medicare HMO

## 2019-03-30 DIAGNOSIS — Z0000 Encounter for general adult medical examination without abnormal findings: Secondary | ICD-10-CM

## 2019-03-30 DIAGNOSIS — E876 Hypokalemia: Secondary | ICD-10-CM

## 2019-03-30 DIAGNOSIS — R197 Diarrhea, unspecified: Secondary | ICD-10-CM | POA: Diagnosis not present

## 2019-03-30 DIAGNOSIS — R739 Hyperglycemia, unspecified: Secondary | ICD-10-CM

## 2019-03-30 DIAGNOSIS — E291 Testicular hypofunction: Secondary | ICD-10-CM | POA: Diagnosis not present

## 2019-03-30 LAB — CBC WITH DIFFERENTIAL/PLATELET
Basophils Absolute: 0.1 10*3/uL (ref 0.0–0.1)
Basophils Relative: 1.1 % (ref 0.0–3.0)
Eosinophils Absolute: 0.1 10*3/uL (ref 0.0–0.7)
Eosinophils Relative: 1.8 % (ref 0.0–5.0)
HCT: 42.8 % (ref 39.0–52.0)
Hemoglobin: 14.4 g/dL (ref 13.0–17.0)
Lymphocytes Relative: 23.7 % (ref 12.0–46.0)
Lymphs Abs: 1.3 10*3/uL (ref 0.7–4.0)
MCHC: 33.6 g/dL (ref 30.0–36.0)
MCV: 82.1 fl (ref 78.0–100.0)
Monocytes Absolute: 0.5 10*3/uL (ref 0.1–1.0)
Monocytes Relative: 8.9 % (ref 3.0–12.0)
Neutro Abs: 3.6 10*3/uL (ref 1.4–7.7)
Neutrophils Relative %: 64.5 % (ref 43.0–77.0)
Platelets: 184 10*3/uL (ref 150.0–400.0)
RBC: 5.21 Mil/uL (ref 4.22–5.81)
RDW: 13.7 % (ref 11.5–15.5)
WBC: 5.7 10*3/uL (ref 4.0–10.5)

## 2019-03-30 LAB — LIPID PANEL
Cholesterol: 144 mg/dL (ref 0–200)
HDL: 26.4 mg/dL — ABNORMAL LOW (ref >39.00)
LDL Cholesterol: 82 mg/dL (ref 0–99)
NonHDL: 117.11
Total CHOL/HDL Ratio: 5
Triglycerides: 174 mg/dL — ABNORMAL HIGH (ref 0.0–149.0)
VLDL: 34.8 mg/dL (ref 0.0–40.0)

## 2019-03-30 LAB — HEPATIC FUNCTION PANEL
ALT: 94 U/L — ABNORMAL HIGH (ref 0–53)
AST: 32 U/L (ref 0–37)
Albumin: 4 g/dL (ref 3.5–5.2)
Alkaline Phosphatase: 136 U/L — ABNORMAL HIGH (ref 39–117)
Bilirubin, Direct: 0.2 mg/dL (ref 0.0–0.3)
Total Bilirubin: 0.9 mg/dL (ref 0.2–1.2)
Total Protein: 6.6 g/dL (ref 6.0–8.3)

## 2019-03-30 LAB — BASIC METABOLIC PANEL
BUN: 8 mg/dL (ref 6–23)
CO2: 31 mEq/L (ref 19–32)
Calcium: 9.3 mg/dL (ref 8.4–10.5)
Chloride: 101 mEq/L (ref 96–112)
Creatinine, Ser: 1.41 mg/dL (ref 0.40–1.50)
GFR: 52.77 mL/min — ABNORMAL LOW (ref >60.00)
Glucose, Bld: 146 mg/dL — ABNORMAL HIGH (ref 70–99)
Potassium: 3.5 mEq/L (ref 3.5–5.1)
Sodium: 140 mEq/L (ref 135–145)

## 2019-03-30 LAB — TESTOSTERONE: Testosterone: 261.21 ng/dL — ABNORMAL LOW (ref 300.00–890.00)

## 2019-03-30 LAB — HEMOGLOBIN A1C: Hgb A1c MFr Bld: 5.4 % (ref 4.6–6.5)

## 2019-04-05 ENCOUNTER — Telehealth: Payer: Self-pay | Admitting: Internal Medicine

## 2019-04-05 MED ORDER — OXYCODONE-ACETAMINOPHEN 5-325 MG PO TABS: 1.0000 | ORAL_TABLET | Freq: Every day | ORAL | 0 refills | Status: DC | PRN

## 2019-04-05 NOTE — Telephone Encounter (Signed)
Refill for oxyCODONE-acetaminophen (PERCOCET/ROXICET) 5-325 MG tablet ° ° °Pharmacy:  °[facility] - [place], [place] - [address] [phone] (Phone) [phone] (Fax)  ° ° °

## 2019-04-05 NOTE — Telephone Encounter (Signed)
Last OV 03/29/19 Next OV 07/21/19  [place] Controlled Substance Database checked. Last filled on 02/17/19

## 2019-04-05 NOTE — Telephone Encounter (Signed)
Done erx 

## 2019-04-12 ENCOUNTER — Other Ambulatory Visit: Payer: Self-pay | Admitting: Internal Medicine

## 2019-04-12 DIAGNOSIS — G4733 Obstructive sleep apnea (adult) (pediatric): Secondary | ICD-10-CM | POA: Diagnosis not present

## 2019-05-05 ENCOUNTER — Ambulatory Visit (INDEPENDENT_AMBULATORY_CARE_PROVIDER_SITE_OTHER): Payer: Medicare HMO

## 2019-05-05 ENCOUNTER — Telehealth: Payer: Self-pay | Admitting: Internal Medicine

## 2019-05-05 ENCOUNTER — Other Ambulatory Visit: Payer: Self-pay

## 2019-05-05 DIAGNOSIS — Z23 Encounter for immunization: Secondary | ICD-10-CM | POA: Diagnosis not present

## 2019-05-05 MED ORDER — OXYCODONE-ACETAMINOPHEN 5-325 MG PO TABS: 1.0000 | ORAL_TABLET | Freq: Every day | ORAL | 0 refills | Status: DC | PRN

## 2019-05-05 NOTE — Telephone Encounter (Signed)
Done erx 

## 2019-05-05 NOTE — Telephone Encounter (Signed)
Medication Refill - Medication:  oxyCODONE-acetaminophen (PERCOCET/ROXICET) 5-325 MG tablet  Has the patient contacted their pharmacy? Yes advised to call office.  Preferred Pharmacy (with phone number or street name):  [place], [place] [phone] (Phone) [phone] (Fax)   Agent: Please be advised that RX refills may take up to 3 business days. We ask that you follow-up with your pharmacy.

## 2019-05-13 DIAGNOSIS — G4733 Obstructive sleep apnea (adult) (pediatric): Secondary | ICD-10-CM | POA: Diagnosis not present

## 2019-06-02 ENCOUNTER — Other Ambulatory Visit: Payer: Self-pay | Admitting: Internal Medicine

## 2019-06-02 MED ORDER — OXYCODONE-ACETAMINOPHEN 5-325 MG PO TABS: 1.0000 | ORAL_TABLET | Freq: Every day | ORAL | 0 refills | Status: DC | PRN

## 2019-06-02 NOTE — Telephone Encounter (Signed)
Requested medication (s) are due for refill today:yes  Requested medication (s) are on the active medication list: yes  Last refill: 05/05/2019  Future visit scheduled: yes  Notes to clinic:  Refill cannot de delegated   Requested Prescriptions  Pending Prescriptions Disp Refills   oxyCODONE-acetaminophen (PERCOCET/ROXICET) 5-325 MG tablet 30 tablet 0    Sig: Take 1 tablet by mouth daily as needed for severe pain.     Not Delegated - Analgesics:  Opioid Agonist Combinations Failed - 06/02/2019 10:55 AM      Failed - This refill cannot be delegated      Passed - Urine Drug Screen completed in last 360 days.      Passed - Valid encounter within last 6 months    Recent Outpatient Visits          2 months ago Diarrhea, unspecified type   [facility], James W, MD   2 months ago Hypokalemia   [facility] -Georges Mouse, MD   2 months ago Essential hypertension   [place], James W, MD   3 months ago Epigastric pain   [place], James W, MD   4 months ago Preventative health care   [facility] -Georges Mouse, MD      Future Appointments            In 1 month Jenny Reichmann, Hunt Oris, MD [place], [facility]

## 2019-06-02 NOTE — Telephone Encounter (Signed)
Done erx 

## 2019-06-02 NOTE — Telephone Encounter (Signed)
oxyCODONE-acetaminophen (PERCOCET/ROXICET) 5-325 MG tablet [ ° ° ° °Patient is requesting a refill.  ° ° ° °Pharmacy:  °[facility] - [place], [place] - [address] [phone] (Phone) [phone] (Fax)  ° ° °

## 2019-06-02 NOTE — Telephone Encounter (Signed)
Pt last seen March 29 2019  Done erx

## 2019-06-02 NOTE — Telephone Encounter (Signed)
Routing to CMA 

## 2019-06-12 DIAGNOSIS — G4733 Obstructive sleep apnea (adult) (pediatric): Secondary | ICD-10-CM | POA: Diagnosis not present

## 2019-06-14 ENCOUNTER — Other Ambulatory Visit: Payer: Self-pay | Admitting: Internal Medicine

## 2019-06-23 ENCOUNTER — Encounter (INDEPENDENT_AMBULATORY_CARE_PROVIDER_SITE_OTHER): Payer: Self-pay

## 2019-07-04 ENCOUNTER — Other Ambulatory Visit: Payer: Self-pay | Admitting: Internal Medicine

## 2019-07-04 MED ORDER — OXYCODONE-ACETAMINOPHEN 5-325 MG PO TABS: 1.0000 | ORAL_TABLET | Freq: Every day | ORAL | 0 refills | Status: DC | PRN

## 2019-07-04 NOTE — Telephone Encounter (Signed)
[place] Controlled Database Checked Last filled: 06/06/19 # 30 LOV w/you: 03/29/19  Next appt w/you: 07/21/19

## 2019-07-04 NOTE — Telephone Encounter (Signed)
Pt request refill    oxyCODONE-acetaminophen (PERCOCET/ROXICET) 5-325 MG tablet    [address] [place], [place] - [place] [phone] (Phone) [phone] (Fax)

## 2019-07-04 NOTE — Telephone Encounter (Signed)
Done erx 

## 2019-07-13 DIAGNOSIS — G4733 Obstructive sleep apnea (adult) (pediatric): Secondary | ICD-10-CM | POA: Diagnosis not present

## 2019-07-18 ENCOUNTER — Other Ambulatory Visit: Payer: Self-pay | Admitting: Internal Medicine

## 2019-07-19 ENCOUNTER — Ambulatory Visit: Payer: Medicare HMO | Admitting: Internal Medicine

## 2019-07-21 ENCOUNTER — Other Ambulatory Visit: Payer: Self-pay

## 2019-07-21 ENCOUNTER — Ambulatory Visit (INDEPENDENT_AMBULATORY_CARE_PROVIDER_SITE_OTHER): Payer: Medicare HMO | Admitting: Internal Medicine

## 2019-07-21 ENCOUNTER — Encounter: Payer: Self-pay | Admitting: Internal Medicine

## 2019-07-21 VITALS — BP 128/86 | HR 75 | Temp 98.1°F | Ht 67.0 in | Wt 276.0 lb

## 2019-07-21 DIAGNOSIS — E785 Hyperlipidemia, unspecified: Secondary | ICD-10-CM

## 2019-07-21 DIAGNOSIS — N183 Chronic kidney disease, stage 3 unspecified: Secondary | ICD-10-CM | POA: Diagnosis not present

## 2019-07-21 DIAGNOSIS — J309 Allergic rhinitis, unspecified: Secondary | ICD-10-CM | POA: Diagnosis not present

## 2019-07-21 DIAGNOSIS — R739 Hyperglycemia, unspecified: Secondary | ICD-10-CM | POA: Diagnosis not present

## 2019-07-21 NOTE — Patient Instructions (Signed)
Please continue all other medications as before, and refills have been done if requested.  Please have the pharmacy call with any other refills you may need.  Please continue your efforts at being more active, low cholesterol diet, and weight control.  Please keep your appointments with your specialists as you may have planned  Please return in 6 months, or sooner if needed 

## 2019-07-21 NOTE — Assessment & Plan Note (Signed)
stable overall by history and exam, recent data reviewed with pt, and pt to continue medical treatment as before,  to f/u any worsening symptoms or concerns  

## 2019-07-21 NOTE — Progress Notes (Signed)
Subjective:    Patient ID: Darren Black, male    DOB: 19-Mar-1966, 53 y.o.   MRN: [id number]  HPI  Here to f/u; overall doing ok,  Pt denies chest pain, increasing sob or doe, wheezing, orthopnea, PND, increased LE swelling, palpitations, dizziness or syncope.  Pt denies new neurological symptoms such as new headache, or facial or extremity weakness or numbness.  Pt denies polydipsia, polyuria, or low sugar episode.  Pt states overall good compliance with meds, mostly trying to follow appropriate diet, with wt overall stable,  but little exercise however.  .Does have several wks ongoing nasal allergy symptoms with clearish congestion, itch and sneezing, without fever, pain, ST, cough, swelling or wheezing. No new complaints Past Medical History:  Diagnosis Date  . Anxiety   . BPH (benign prostatic hypertrophy)   . Chronic lower back pain    nerve damage w/ leg pain  . Depression   . Diarrhea 03/23/2019  . Family history of adverse reaction to anesthesia    MOTHER--- HARD TO WAKE  . History of panic attacks   . Hyperlipidemia   . Hypertension   . Meatal stenosis   . OSA on CPAP    moderate to severe OSA  per study 04-28-2007 uses CPAP  . PONV (postoperative nausea and vomiting)    Past Surgical History:  Procedure Laterality Date  . APPENDECTOMY  2003  . BREATH TEK H PYLORI  04/27/2012   Procedure: BREATH TEK H PYLORI;  Surgeon: Pedro Earls, MD;  Location: [facility];  Service: General;  Laterality: N/A;  . CHOLECYSTECTOMY N/A 03/24/2019   Procedure: LAPAROSCOPIC CHOLECYSTECTOMY WITH INTRAOPERATIVE CHOLANGIOGRAM;  Surgeon: Donnie Mesa, MD;  Location: [facility];  Service: General;  Laterality: N/A;  . CYSTOSCOPY WITH URETHRAL DILATATION N/A 04/12/2015   Procedure: CYSTOSCOPY WITH URETHRAL DILATATION, RETROGRADE AND URETHROGRAM WITH BLADDER BIOPSY;  Surgeon: Irine Seal, MD;  Location: [facility];  Service: Urology;  Laterality: N/A;  . ROTATOR CUFF REPAIR Right  01-21-2012  . TRANSTHORACIC ECHOCARDIOGRAM  03-26-2007   normal LVSF, ef 60%,  mild AV calcification without stenosis,  mild MR and TR,  mild LAE  . URETHROGRAM N/A 04/12/2015   Procedure: URETHROGRAM;  Surgeon: Irine Seal, MD;  Location: [facility];  Service: Urology;  Laterality: N/A;    reports that he has never smoked. He has never used smokeless tobacco. He reports current alcohol use. He reports that he does not use drugs. family history includes Cancer in his father; Coronary artery disease in his father; Diabetes in an other family member; Hyperlipidemia in an other family member; Hypertension in an other family member; Stroke in an other family member. Allergies  Allergen Reactions  . Crestor [Rosuvastatin Calcium] Other (See Comments)    abd pain   Current Outpatient Medications on File Prior to Visit  Medication Sig Dispense Refill  . amLODipine (NORVASC) 5 MG tablet Take 1 tablet by mouth once daily (Patient taking differently: Take 5 mg by mouth daily as needed (high blood pressure). ) 90 tablet 0  . benazepril (LOTENSIN) 40 MG tablet Take 1 tablet by mouth once daily 90 tablet 1  . colestipol (COLESTID) 5 g granules Take 5 g by mouth 2 (two) times daily. 500 g 2  . doxazosin (CARDURA) 4 MG tablet TAKE 1 TABLET BY MOUTH  AT BEDTIME 90 tablet 0  . DULoxetine (CYMBALTA) 30 MG capsule Take 1 capsule (30 mg total) by mouth daily. 90 capsule 3  .  fluticasone (FLONASE) 50 MCG/ACT nasal spray Use 2 spray(s) in each nostril once daily 16 g 0  . LORazepam (ATIVAN) 1 MG tablet Take 1 tablet by mouth twice daily as needed for anxiety 60 tablet 2  . oxyCODONE-acetaminophen (PERCOCET/ROXICET) 5-325 MG tablet Take 1 tablet by mouth daily as needed for severe pain. 30 tablet 0  . pantoprazole (PROTONIX) 40 MG tablet Take 1 tablet (40 mg total) by mouth 2 (two) times daily before a meal. 60 tablet 1  . rosuvastatin (CRESTOR) 20 MG tablet Take 1 tablet (20 mg total) by mouth daily.  90 tablet 3  . sucralfate (CARAFATE) 1 g tablet Take 1 tablet (1 g total) by mouth 4 (four) times daily -  with meals and at bedtime. 20 tablet 0  . tamsulosin (FLOMAX) 0.4 MG CAPS capsule Take 1 capsule (0.4 mg total) by mouth daily. 90 capsule 3  . testosterone (ANDROGEL) 50 MG/5GM (1%) GEL Place 5 g onto the skin daily. 450 g 1  . triamcinolone (NASACORT) 55 MCG/ACT AERO nasal inhaler Place 2 sprays into the nose daily. 1 Inhaler 12  . [DISCONTINUED] potassium chloride (KLOR-CON 10) 10 MEQ CR tablet Take 1 tablet (10 mEq total) by mouth daily. 90 tablet 3   No current facility-administered medications on file prior to visit.    Review of Systems  Constitutional: Negative for other unusual diaphoresis or sweats HENT: Negative for ear discharge or swelling Eyes: Negative for other worsening visual disturbances Respiratory: Negative for stridor or other swelling  Gastrointestinal: Negative for worsening distension or other blood Genitourinary: Negative for retention or other urinary change Musculoskeletal: Negative for other MSK pain or swelling Skin: Negative for color change or other new lesions Neurological: Negative for worsening tremors and other numbness  Psychiatric/Behavioral: Negative for worsening agitation or other fatigue All otherwise neg per pt     Objective:   Physical Exam BP 128/86   Pulse 75   Temp 98.1 F (36.7 C) (Oral)   Ht 5\' 7"  (1.702 m)   Wt 276 lb (125.2 kg)   SpO2 98%   BMI 43.23 kg/m  VS noted,  Constitutional: Pt appears in NAD HENT: Head: NCAT.  Right Ear: External ear normal.  Left Ear: External ear normal.  Eyes: . Pupils are equal, round, and reactive to light. Conjunctivae and EOM are normal Bilat tm's with mild erythema.  Max sinus areas non tender.  Pharynx with mild erythema, no exudate Nose: without d/c or deformity Neck: Neck supple. Gross normal ROM Cardiovascular: Normal rate and regular rhythm.   Pulmonary/Chest: Effort normal and  breath sounds without rales or wheezing.  Abd:  Soft, NT, ND, + BS, no organomegaly Neurological: Pt is alert. At baseline orientation, motor grossly intact Skin: Skin is warm. No rashes, other new lesions, no LE edema Psychiatric: Pt behavior is normal without agitation  All otherwise neg per pt Lab Results  Component Value Date   WBC 5.7 03/30/2019   HGB 14.4 03/30/2019   HCT 42.8 03/30/2019   PLT 184.0 03/30/2019   GLUCOSE 146 (H) 03/30/2019   CHOL 144 03/30/2019   TRIG 174.0 (H) 03/30/2019   HDL 26.40 (L) 03/30/2019   LDLDIRECT 139.0 01/02/2016   LDLCALC 82 03/30/2019   ALT 94 (H) 03/30/2019   AST 32 03/30/2019   NA 140 03/30/2019   K 3.5 03/30/2019   CL 101 03/30/2019   CREATININE 1.41 03/30/2019   BUN 8 03/30/2019   CO2 31 03/30/2019   TSH 2.22 01/12/2019  PSA 0.97 01/12/2019   HGBA1C 5.4 03/30/2019       Assessment & Plan:

## 2019-07-21 NOTE — Assessment & Plan Note (Signed)
Mild to mod, for otc antihistamine prn,   to f/u any worsening symptoms or concerns °

## 2019-08-04 ENCOUNTER — Telehealth: Payer: Self-pay | Admitting: Internal Medicine

## 2019-08-04 MED ORDER — OXYCODONE-ACETAMINOPHEN 5-325 MG PO TABS: 1.0000 | ORAL_TABLET | Freq: Every day | ORAL | 0 refills | Status: DC | PRN

## 2019-08-04 NOTE — Telephone Encounter (Signed)
Done erx 

## 2019-08-04 NOTE — Telephone Encounter (Signed)
[name] spouse is requesting a refill for oxyCODONE-acetaminophen (PERCOCET/ROXICET) 5-325 MG tablet    Pharmacy:  [facility] - [place], [place] - [address] [phone] (Phone) [phone] (Fax)    

## 2019-08-12 DIAGNOSIS — G4733 Obstructive sleep apnea (adult) (pediatric): Secondary | ICD-10-CM | POA: Diagnosis not present

## 2019-09-03 ENCOUNTER — Other Ambulatory Visit: Payer: Self-pay | Admitting: Internal Medicine

## 2019-09-03 NOTE — Telephone Encounter (Signed)
Please refill as per office routine med refill policy (all routine meds refilled for 3 mo or monthly per pt preference up to one year from last visit, then month to month grace period for 3 mo, then further med refills will have to be denied)  

## 2019-09-05 ENCOUNTER — Telehealth: Payer: Self-pay | Admitting: Internal Medicine

## 2019-09-05 NOTE — Telephone Encounter (Signed)
[name] spouse is requesting a refill for oxyCODONE-acetaminophen (PERCOCET/ROXICET) 5-325 MG tablet    Pharmacy:  [facility] [address] [place] - [address] [phone] (Phone) [phone] (Fax)

## 2019-09-06 MED ORDER — OXYCODONE-ACETAMINOPHEN 5-325 MG PO TABS: 1.0000 | ORAL_TABLET | Freq: Every day | ORAL | 0 refills | Status: DC | PRN

## 2019-09-06 NOTE — Telephone Encounter (Signed)
Done erx 

## 2019-09-06 NOTE — Telephone Encounter (Signed)
Check [place] registry last filled 08/04/2019../lmb  

## 2019-09-12 DIAGNOSIS — G4733 Obstructive sleep apnea (adult) (pediatric): Secondary | ICD-10-CM | POA: Diagnosis not present

## 2019-09-22 ENCOUNTER — Encounter: Payer: Self-pay | Admitting: Internal Medicine

## 2019-10-04 ENCOUNTER — Telehealth: Payer: Self-pay | Admitting: Internal Medicine

## 2019-10-04 NOTE — Telephone Encounter (Signed)
Patient is requesting oxycodone to be went to [facility] on [address].

## 2019-10-06 MED ORDER — OXYCODONE-ACETAMINOPHEN 5-325 MG PO TABS: 1.0000 | ORAL_TABLET | Freq: Every day | ORAL | 0 refills | Status: DC | PRN

## 2019-10-06 NOTE — Telephone Encounter (Signed)
Notified pt rx has been sent to [facility]../lmb 

## 2019-10-06 NOTE — Telephone Encounter (Signed)
Done erx 

## 2019-10-13 DIAGNOSIS — G4733 Obstructive sleep apnea (adult) (pediatric): Secondary | ICD-10-CM | POA: Diagnosis not present

## 2019-10-26 DIAGNOSIS — Z23 Encounter for immunization: Secondary | ICD-10-CM | POA: Diagnosis not present

## 2019-10-26 DIAGNOSIS — L723 Sebaceous cyst: Secondary | ICD-10-CM | POA: Diagnosis not present

## 2019-11-01 ENCOUNTER — Telehealth: Payer: Self-pay

## 2019-11-01 NOTE — Telephone Encounter (Signed)
1.Medication Requested:oxyCODONE-acetaminophen (PERCOCET/ROXICET) 5-325 MG tablet  2. Pharmacy (Name, Street, [place]): [facility] [address]   3. On Med List: Yes   4. Last Visit with PCP: 11.19.20  5. Next visit date with PCP: 5.19.

## 2019-11-02 MED ORDER — OXYCODONE-ACETAMINOPHEN 5-325 MG PO TABS: 1.0000 | ORAL_TABLET | Freq: Every day | ORAL | 0 refills | Status: DC | PRN

## 2019-11-02 NOTE — Telephone Encounter (Signed)
Done erx 

## 2019-11-02 NOTE — Telephone Encounter (Signed)
Pt advised of rx refill.  ?

## 2019-12-05 ENCOUNTER — Other Ambulatory Visit: Payer: Self-pay | Admitting: Internal Medicine

## 2019-12-05 ENCOUNTER — Other Ambulatory Visit: Payer: Self-pay

## 2019-12-05 NOTE — Telephone Encounter (Signed)
Done erx 

## 2019-12-05 NOTE — Telephone Encounter (Signed)
1.Medication Requested:oxyCODONE-acetaminophen (PERCOCET/ROXICET) 5-325 MG tablet  2. Pharmacy (Name, Street, [place]):[facility] - [place], [place] - [address]  3. On Med List: yes   4. Last Visit with PCP: 11.19.20  5. Next visit date with PCP: 5.19.21    Agent: Please be advised that RX refills may take up to 3 business days. We ask that you follow-up with your pharmacy.

## 2019-12-06 MED ORDER — OXYCODONE-ACETAMINOPHEN 5-325 MG PO TABS: 1.0000 | ORAL_TABLET | Freq: Every day | ORAL | 0 refills | Status: DC | PRN

## 2019-12-06 NOTE — Telephone Encounter (Signed)
Done erx 

## 2020-01-02 ENCOUNTER — Telehealth: Payer: Self-pay

## 2020-01-02 MED ORDER — OXYCODONE-ACETAMINOPHEN 5-325 MG PO TABS: 1.0000 | ORAL_TABLET | Freq: Every day | ORAL | 0 refills | Status: DC | PRN

## 2020-01-02 NOTE — Telephone Encounter (Signed)
1.Medication Requested:oxyCODONE-acetaminophen (PERCOCET/ROXICET) 5-325 MG tablet  2. Pharmacy (Name, Street, [place]):[facility] - [place], [place] - [address]  3. On Med List: Yes   4. Last Visit with PCP: 11.19.20  5. Next visit date with PCP: 5.19.2021    Agent: Please be advised that RX refills may take up to 3 business days. We ask that you follow-up with your pharmacy.

## 2020-01-02 NOTE — Telephone Encounter (Signed)
Done erx 

## 2020-01-17 ENCOUNTER — Other Ambulatory Visit: Payer: Self-pay | Admitting: Internal Medicine

## 2020-01-17 NOTE — Telephone Encounter (Signed)
Please refill as per office routine med refill policy (all routine meds refilled for 3 mo or monthly per pt preference up to one year from last visit, then month to month grace period for 3 mo, then further med refills will have to be denied)  

## 2020-01-18 ENCOUNTER — Ambulatory Visit (INDEPENDENT_AMBULATORY_CARE_PROVIDER_SITE_OTHER): Payer: Medicare HMO | Admitting: Internal Medicine

## 2020-01-18 ENCOUNTER — Other Ambulatory Visit: Payer: Self-pay

## 2020-01-18 ENCOUNTER — Encounter: Payer: Self-pay | Admitting: Internal Medicine

## 2020-01-18 VITALS — BP 160/98 | HR 83 | Temp 98.1°F | Ht 67.0 in | Wt 275.0 lb

## 2020-01-18 DIAGNOSIS — I10 Essential (primary) hypertension: Secondary | ICD-10-CM

## 2020-01-18 DIAGNOSIS — R739 Hyperglycemia, unspecified: Secondary | ICD-10-CM | POA: Diagnosis not present

## 2020-01-18 DIAGNOSIS — Z125 Encounter for screening for malignant neoplasm of prostate: Secondary | ICD-10-CM

## 2020-01-18 DIAGNOSIS — Z0000 Encounter for general adult medical examination without abnormal findings: Secondary | ICD-10-CM

## 2020-01-18 LAB — LIPID PANEL
Cholesterol: 139 mg/dL (ref 0–200)
HDL: 37.4 mg/dL — ABNORMAL LOW (ref >39.00)
LDL Cholesterol: 83 mg/dL (ref 0–99)
NonHDL: 101.98
Total CHOL/HDL Ratio: 4
Triglycerides: 96 mg/dL (ref 0.0–149.0)
VLDL: 19.2 mg/dL (ref 0.0–40.0)

## 2020-01-18 LAB — CBC WITH DIFFERENTIAL/PLATELET
Basophils Absolute: 0.1 10*3/uL (ref 0.0–0.1)
Basophils Relative: 1.1 % (ref 0.0–3.0)
Eosinophils Absolute: 0.1 10*3/uL (ref 0.0–0.7)
Eosinophils Relative: 2.3 % (ref 0.0–5.0)
HCT: 45 % (ref 39.0–52.0)
Hemoglobin: 15.1 g/dL (ref 13.0–17.0)
Lymphocytes Relative: 24.7 % (ref 12.0–46.0)
Lymphs Abs: 1.6 10*3/uL (ref 0.7–4.0)
MCHC: 33.5 g/dL (ref 30.0–36.0)
MCV: 81.8 fl (ref 78.0–100.0)
Monocytes Absolute: 0.5 10*3/uL (ref 0.1–1.0)
Monocytes Relative: 8.3 % (ref 3.0–12.0)
Neutro Abs: 4 10*3/uL (ref 1.4–7.7)
Neutrophils Relative %: 63.6 % (ref 43.0–77.0)
Platelets: 172 10*3/uL (ref 150.0–400.0)
RBC: 5.5 Mil/uL (ref 4.22–5.81)
RDW: 13.6 % (ref 11.5–15.5)
WBC: 6.3 10*3/uL (ref 4.0–10.5)

## 2020-01-18 LAB — BASIC METABOLIC PANEL
BUN: 19 mg/dL (ref 6–23)
CO2: 30 mEq/L (ref 19–32)
Calcium: 9.7 mg/dL (ref 8.4–10.5)
Chloride: 102 mEq/L (ref 96–112)
Creatinine, Ser: 1.7 mg/dL — ABNORMAL HIGH (ref 0.40–1.50)
GFR: 42.39 mL/min — ABNORMAL LOW (ref >60.00)
Glucose, Bld: 94 mg/dL (ref 70–99)
Potassium: 4.9 mEq/L (ref 3.5–5.1)
Sodium: 138 mEq/L (ref 135–145)

## 2020-01-18 LAB — HEPATIC FUNCTION PANEL
ALT: 28 U/L (ref 0–53)
AST: 29 U/L (ref 0–37)
Albumin: 4.7 g/dL (ref 3.5–5.2)
Alkaline Phosphatase: 70 U/L (ref 39–117)
Bilirubin, Direct: 0.1 mg/dL (ref 0.0–0.3)
Total Bilirubin: 0.7 mg/dL (ref 0.2–1.2)
Total Protein: 7 g/dL (ref 6.0–8.3)

## 2020-01-18 LAB — TSH: TSH: 1.54 u[IU]/mL (ref 0.35–4.50)

## 2020-01-18 LAB — PSA: PSA: 1.1 ng/mL (ref 0.10–4.00)

## 2020-01-18 MED ORDER — POTASSIUM CHLORIDE ER 10 MEQ PO TBCR
10.0000 meq | EXTENDED_RELEASE_TABLET | Freq: Every day | ORAL | 3 refills | Status: DC
Start: 2020-01-18 — End: 2020-12-31

## 2020-01-18 MED ORDER — HYDROCHLOROTHIAZIDE 25 MG PO TABS
12.5000 mg | ORAL_TABLET | Freq: Every day | ORAL | 3 refills | Status: DC
Start: 2020-01-18 — End: 2020-12-31

## 2020-01-18 MED ORDER — AMLODIPINE BESYLATE 5 MG PO TABS: 5.0000 mg | ORAL_TABLET | Freq: Every day | ORAL | 3 refills | Status: DC

## 2020-01-18 NOTE — Patient Instructions (Signed)
Ok to restart the amlodipine 5 mg per day, the HCT fluid pill 12.5 mg per day, and the potassium medication  Please continue all other medications as before, and refills have been done if requested.  Please have the pharmacy call with any other refills you may need.  Please continue your efforts at being more active, low cholesterol diet, and weight control.  You are otherwise up to date with prevention measures today.  Please keep your appointments with your specialists as you may have planned  Please go to the LAB at the blood drawing area for the tests to be done  You will be contacted by phone if any changes need to be made immediately.  Otherwise, you will receive a letter about your results with an explanation, but please check with MyChart first.  Please remember to sign up for MyChart if you have not done so, as this will be important to you in the future with finding out test results, communicating by private email, and scheduling acute appointments online when needed.  Please make an Appointment to return in 6 months, or sooner if needed

## 2020-01-18 NOTE — Progress Notes (Signed)
Subjective:    Patient ID: Darren Black, male    DOB: 08/03/1966, 54 y.o.   MRN: [id number]  HPI  Here for wellness and f/u;  Overall doing ok;  Pt denies Chest pain, worsening SOB, DOE, wheezing, orthopnea, PND, worsening LE edema, palpitations, dizziness or syncope.  Pt denies neurological change such as new headache, facial or extremity weakness.  Pt denies polydipsia, polyuria, or low sugar symptoms. Pt states overall good compliance with treatment and medications, good tolerability, and has been trying to follow appropriate diet.  Pt denies worsening depressive symptoms, suicidal ideation or panic. No fever, night sweats, wt loss, loss of appetite, or other constitutional symptoms.  Pt states good ability with ADL's, has low fall risk, home safety reviewed and adequate, no other significant changes in hearing or vision, and only occasionally active with exercise. Peak wt has been close to 300, and has regained some back after ill last yr.   Wt Readings from Last 3 Encounters:  01/18/20 275 lb (124.7 kg)  07/21/19 276 lb (125.2 kg)  03/24/19 260 lb 5.8 oz (118.1 kg)   Past Medical History:  Diagnosis Date  . Anxiety   . BPH (benign prostatic hypertrophy)   . Chronic lower back pain    nerve damage w/ leg pain  . Depression   . Diarrhea 03/23/2019  . Family history of adverse reaction to anesthesia    MOTHER--- HARD TO WAKE  . History of panic attacks   . Hyperlipidemia   . Hypertension   . Meatal stenosis   . OSA on CPAP    moderate to severe OSA  per study 04-28-2007 uses CPAP  . PONV (postoperative nausea and vomiting)    Past Surgical History:  Procedure Laterality Date  . APPENDECTOMY  2003  . BREATH TEK H PYLORI  04/27/2012   Procedure: BREATH TEK H PYLORI;  Surgeon: Pedro Earls, MD;  Location: [facility];  Service: General;  Laterality: N/A;  . CHOLECYSTECTOMY N/A 03/24/2019   Procedure: LAPAROSCOPIC CHOLECYSTECTOMY WITH INTRAOPERATIVE CHOLANGIOGRAM;  Surgeon:  Donnie Mesa, MD;  Location: [facility];  Service: General;  Laterality: N/A;  . CYSTOSCOPY WITH URETHRAL DILATATION N/A 04/12/2015   Procedure: CYSTOSCOPY WITH URETHRAL DILATATION, RETROGRADE AND URETHROGRAM WITH BLADDER BIOPSY;  Surgeon: Irine Seal, MD;  Location: [facility];  Service: Urology;  Laterality: N/A;  . ROTATOR CUFF REPAIR Right 01-21-2012  . TRANSTHORACIC ECHOCARDIOGRAM  03-26-2007   normal LVSF, ef 60%,  mild AV calcification without stenosis,  mild MR and TR,  mild LAE  . URETHROGRAM N/A 04/12/2015   Procedure: URETHROGRAM;  Surgeon: Irine Seal, MD;  Location[facility];  Service: Urology;  Laterality: N/A;    reports that he has never smoked. He has never used smokeless tobacco. He reports current alcohol use. He reports that he does not use drugs. family history includes Cancer in his father; Coronary artery disease in his father; Diabetes in an other family member; Hyperlipidemia in an other family member; Hypertension in an other family member; Stroke in an other family member. Allergies  Allergen Reactions  . Crestor [Rosuvastatin Calcium] Other (See Comments)    abd pain   Current Outpatient Medications on File Prior to Visit  Medication Sig Dispense Refill  . benazepril (LOTENSIN) 40 MG tablet Take 1 tablet (40 mg total) by mouth daily. Annual appt due in July must see provider for future refills 90 tablet 0  . colestipol (COLESTID) 5 g granules Take 5 g  by mouth 2 (two) times daily. 500 g 2  . doxazosin (CARDURA) 4 MG tablet TAKE 1 TABLET BY MOUTH AT BEDTIME 90 tablet 1  . DULoxetine (CYMBALTA) 30 MG capsule Take 1 capsule (30 mg total) by mouth daily. 90 capsule 3  . fluticasone (FLONASE) 50 MCG/ACT nasal spray Use 2 spray(s) in each nostril once daily 16 g 11  . LORazepam (ATIVAN) 1 MG tablet Take 1 tablet by mouth twice daily as needed for anxiety 60 tablet 5  . oxyCODONE-acetaminophen (PERCOCET/ROXICET) 5-325 MG tablet Take 1 tablet by  mouth daily as needed for severe pain. 30 tablet 0  . pravastatin (PRAVACHOL) 80 MG tablet Take 80 mg by mouth at bedtime.    . rosuvastatin (CRESTOR) 20 MG tablet Take 1 tablet (20 mg total) by mouth daily. 90 tablet 3  . tamsulosin (FLOMAX) 0.4 MG CAPS capsule Take 1 capsule by mouth once daily 90 capsule 1  . testosterone (ANDROGEL) 50 MG/5GM (1%) GEL Place 5 g onto the skin daily. 450 g 1  . triamcinolone (NASACORT) 55 MCG/ACT AERO nasal inhaler Place 2 sprays into the nose daily. 1 Inhaler 12   No current facility-administered medications on file prior to visit.   Review of Systems All otherwise neg per pt     Objective:   Physical Exam BP (!) 160/98 (BP Location: Left Arm, Patient Position: Sitting, Cuff Size: Large)   Pulse 83   Temp 98.1 F (36.7 C) (Oral)   Ht 5\' 7"  (1.702 m)   Wt 275 lb (124.7 kg)   SpO2 97%   BMI 43.07 kg/m  VS noted,  Constitutional: Pt appears in NAD HENT: Head: NCAT.  Right Ear: External ear normal.  Left Ear: External ear normal.  Eyes: . Pupils are equal, round, and reactive to light. Conjunctivae and EOM are normal Nose: without d/c or deformity Neck: Neck supple. Gross normal ROM Cardiovascular: Normal rate and regular rhythm.   Pulmonary/Chest: Effort normal and breath sounds without rales or wheezing.  Abd:  Soft, NT, ND, + BS, no organomegaly Neurological: Pt is alert. At baseline orientation, motor grossly intact Skin: Skin is warm. No rashes, other new lesions, no LE edema Psychiatric: Pt behavior is normal without agitation  All otherwise neg per pt Lab Results  Component Value Date   WBC 6.3 01/18/2020   HGB 15.1 01/18/2020   HCT 45.0 01/18/2020   PLT 172.0 01/18/2020   GLUCOSE 94 01/18/2020   CHOL 139 01/18/2020   TRIG 96.0 01/18/2020   HDL 37.40 (L) 01/18/2020   LDLDIRECT 139.0 01/02/2016   LDLCALC 83 01/18/2020   ALT 28 01/18/2020   AST 29 01/18/2020   NA 138 01/18/2020   K 4.9 01/18/2020   CL 102 01/18/2020    CREATININE 1.70 (H) 01/18/2020   BUN 19 01/18/2020   CO2 30 01/18/2020   TSH 1.54 01/18/2020   PSA 1.10 01/18/2020   HGBA1C 5.2 01/18/2020      Assessment & Plan:

## 2020-01-19 LAB — URINALYSIS, ROUTINE W REFLEX MICROSCOPIC
Bilirubin Urine: NEGATIVE
Hgb urine dipstick: NEGATIVE
Ketones, ur: NEGATIVE
Leukocytes,Ua: NEGATIVE
Nitrite: NEGATIVE
RBC / HPF: NONE SEEN (ref 0–?)
Specific Gravity, Urine: <=1.005 — AB (ref 1.000–1.030)
Urine Glucose: NEGATIVE
Urobilinogen, UA: 0.2 (ref 0.0–1.0)
WBC, UA: NONE SEEN (ref 0–?)
pH: 5.5 (ref 5.0–8.0)

## 2020-01-19 LAB — HEMOGLOBIN A1C: Hgb A1c MFr Bld: 5.2 % (ref 4.6–6.5)

## 2020-01-21 ENCOUNTER — Encounter: Payer: Self-pay | Admitting: Internal Medicine

## 2020-01-21 NOTE — Assessment & Plan Note (Addendum)
Uncontrolled, to restart amlodipine 5 qd, hct 12.5, kdur 10,  to f/u any worsening symptoms or concerns

## 2020-01-21 NOTE — Assessment & Plan Note (Signed)

## 2020-01-21 NOTE — Assessment & Plan Note (Signed)
stable overall by history and exam, recent data reviewed with pt, and pt to continue medical treatment as before,  to f/u any worsening symptoms or concerns  

## 2020-02-02 ENCOUNTER — Telehealth: Payer: Self-pay

## 2020-02-02 NOTE — Telephone Encounter (Signed)
1.Medication Requested:oxyCODONE-acetaminophen (PERCOCET/ROXICET) 5-325 MG tablet  2. Pharmacy (Name, Street, [place]):[facility] - [place], [place] - [address]  3. On Med List: Yes   4. Last Visit with PCP: 5.9.21  5. Next visit date with PCP: 11.19.21    Agent: Please be advised that RX refills may take up to 3 business days. We ask that you follow-up with your pharmacy.

## 2020-02-07 NOTE — Telephone Encounter (Signed)
    Patient checking status of refill 

## 2020-02-09 MED ORDER — OXYCODONE-ACETAMINOPHEN 5-325 MG PO TABS: 1.0000 | ORAL_TABLET | Freq: Every day | ORAL | 0 refills | Status: DC | PRN

## 2020-02-09 NOTE — Telephone Encounter (Signed)
Done erx 

## 2020-03-06 ENCOUNTER — Telehealth: Payer: Self-pay | Admitting: Internal Medicine

## 2020-03-06 NOTE — Telephone Encounter (Signed)
Done erx 

## 2020-03-06 NOTE — Telephone Encounter (Signed)
   Please refill:oxyCODONE-acetaminophen (PERCOCET/ROXICET) 5-325 MG tablet  [facility] - [place], [place] - [address]

## 2020-03-08 NOTE — Telephone Encounter (Signed)
New message:   Pt is calling and states the pharmacy still has not received this prescription. Please advise and resend.

## 2020-03-09 MED ORDER — OXYCODONE-ACETAMINOPHEN 5-325 MG PO TABS: 1.0000 | ORAL_TABLET | Freq: Every day | ORAL | 0 refills | Status: DC | PRN

## 2020-03-09 NOTE — Telephone Encounter (Signed)
Done erx 

## 2020-03-22 ENCOUNTER — Telehealth: Payer: Self-pay | Admitting: Internal Medicine

## 2020-03-22 NOTE — Telephone Encounter (Signed)
    1.Medication Requested:testosterone (ANDROGEL) 50 MG/5GM (1%) GEL  2. Pharmacy (Name, Street, [place]):[facility] - [place], [place] - [address]  3. On Med List: yes  4. Last Visit with PCP:   5. Next visit date with PCP: 07/2020   Agent: Please be advised that RX refills may take up to 3 business days. We ask that you follow-up with your pharmacy.

## 2020-03-25 IMAGING — CT CT ABDOMEN AND PELVIS WITHOUT CONTRAST
2 of 5 series · 17 of 46 positions shown, 19 images · non-contrast
Comparison: 02/05/2019

CLINICAL DATA: Pancreatitis

EXAM:
CT ABDOMEN AND PELVIS WITHOUT CONTRAST
TECHNIQUE: Multidetector CT imaging of the abdomen and pelvis was performed
following the standard protocol without IV contrast.

[Series 5: thins · axial · 0.96mm/px · z∈[-571,-67]mm · 14 of 1089 slices shown, 16 images]
[im 41/1089  soft-tissue]
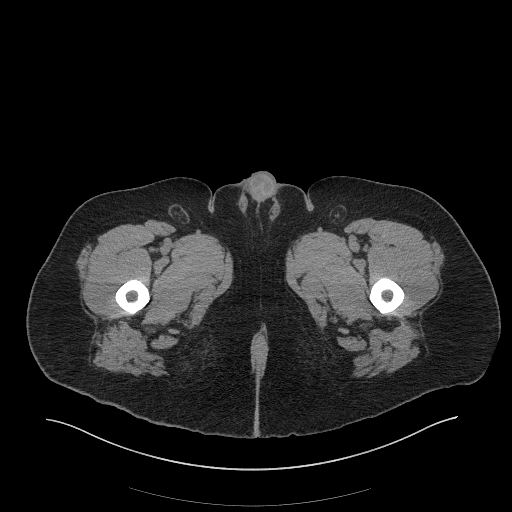
[im 41/1089  bone]
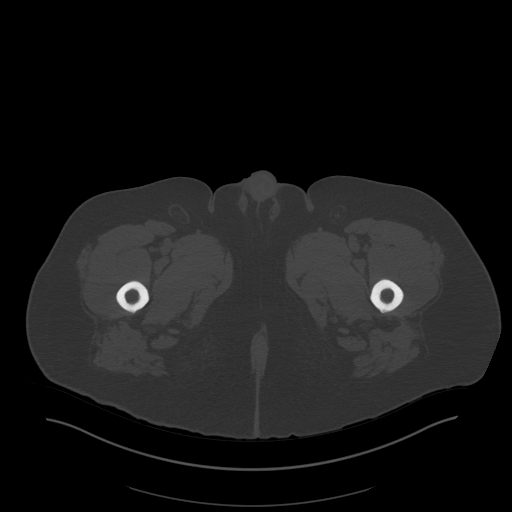
[im 121/1089  soft-tissue]
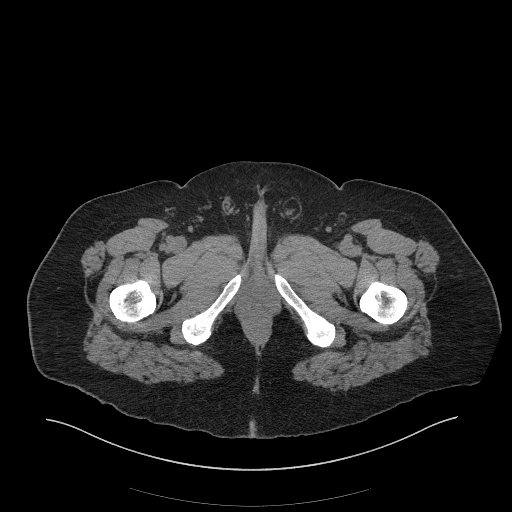
[im 202/1089  soft-tissue]
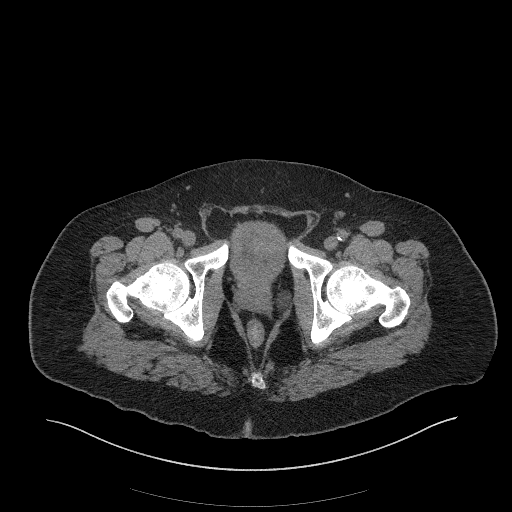
[im 283/1089  soft-tissue]
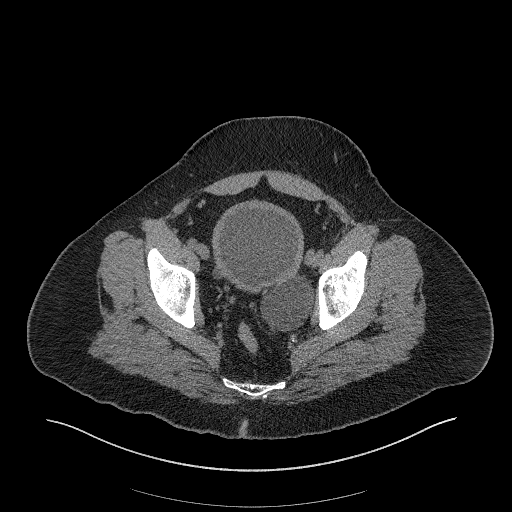
[im 363/1089  soft-tissue]
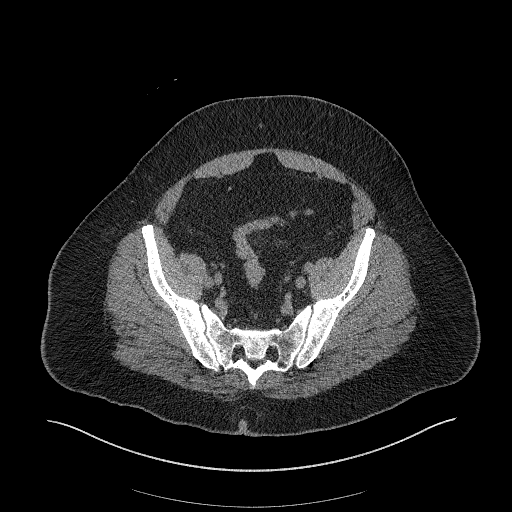
[im 444/1089  soft-tissue]
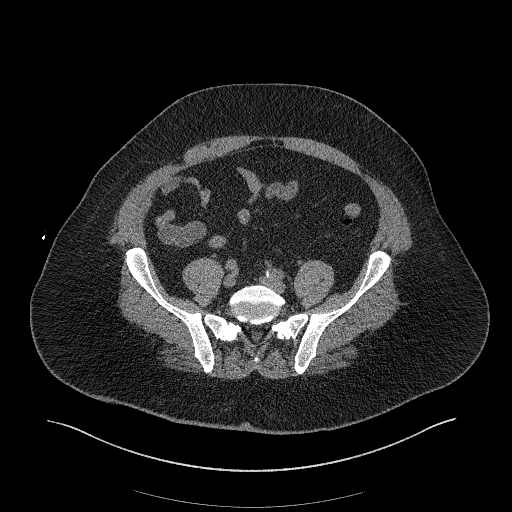
[im 524/1089  soft-tissue]
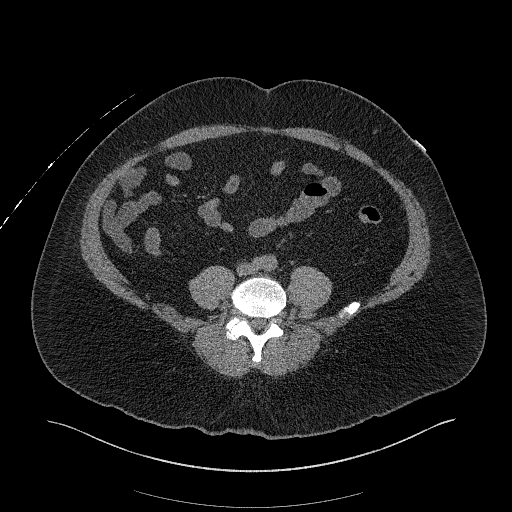
[im 565/1089  soft-tissue]
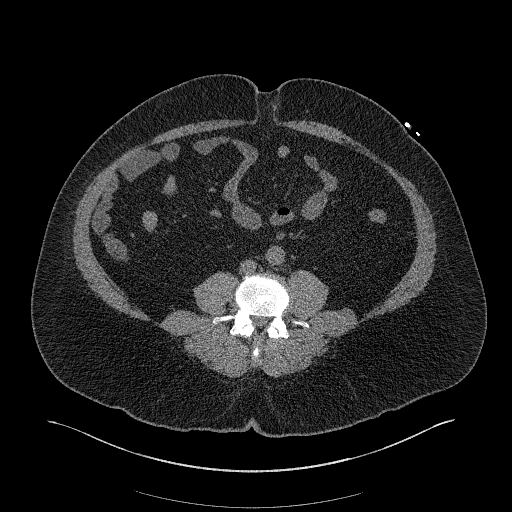
[im 645/1089  soft-tissue]
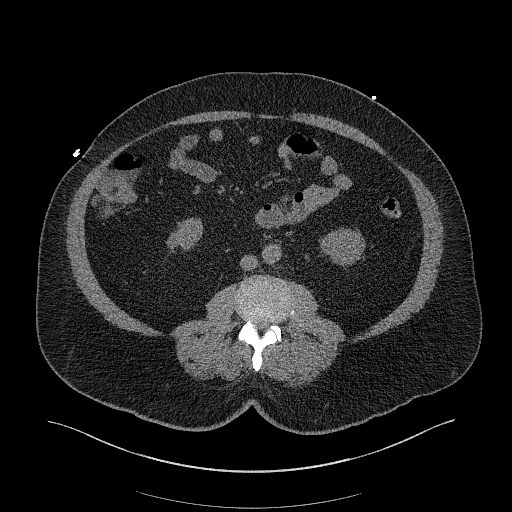
[im 645/1089  bone]
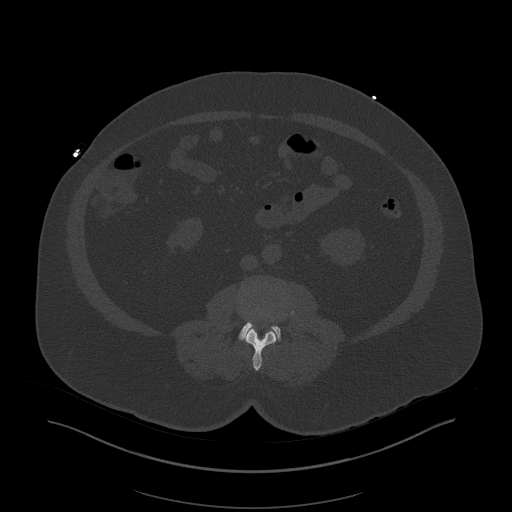
[im 726/1089  soft-tissue]
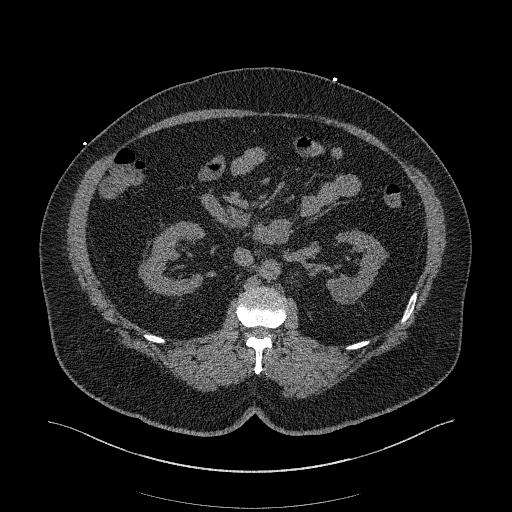
[im 806/1089  soft-tissue]
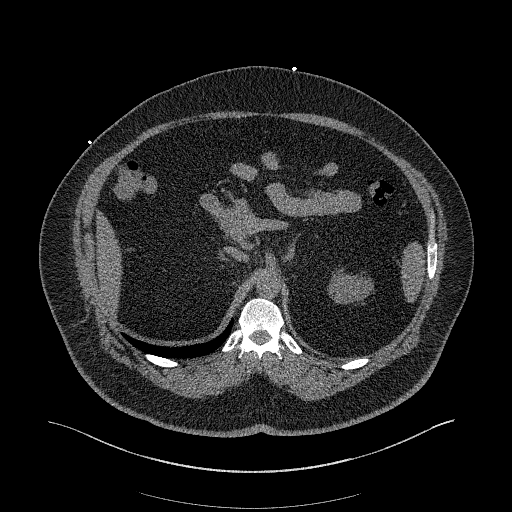
[im 887/1089  soft-tissue]
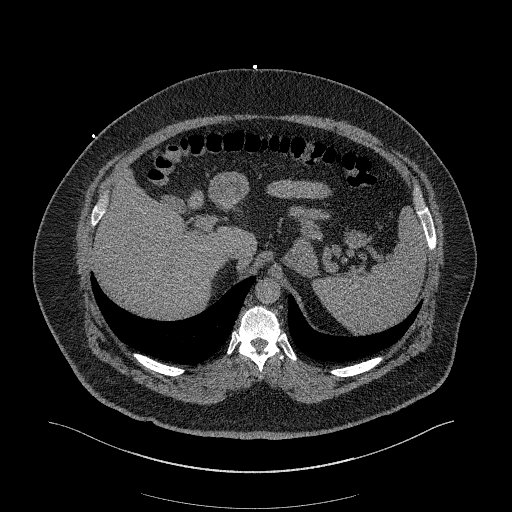
[im 968/1089  soft-tissue]
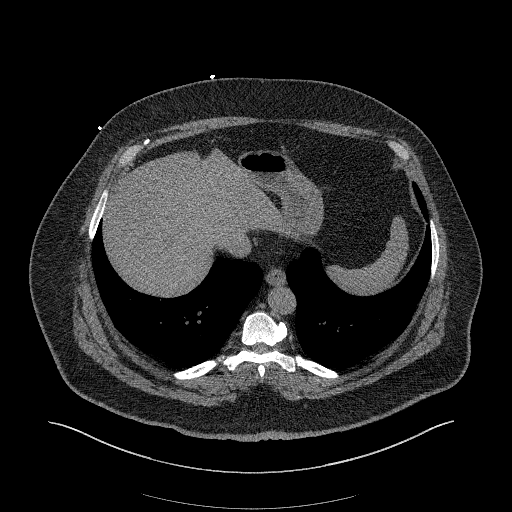
[im 1048/1089  soft-tissue]
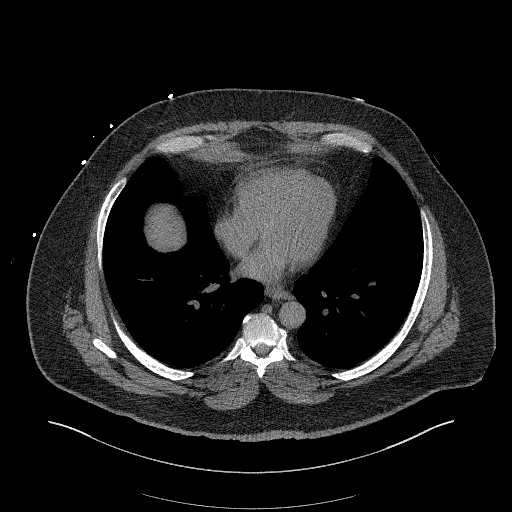

[Series 6: a/p w/o cor · coronal · non-contrast · 1.01mm/px · 3 of 204 slices shown]
[im 68/204  soft-tissue]
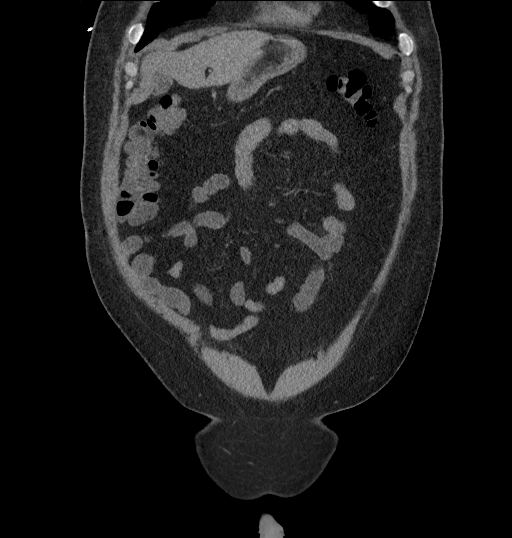
[im 91/204  soft-tissue]
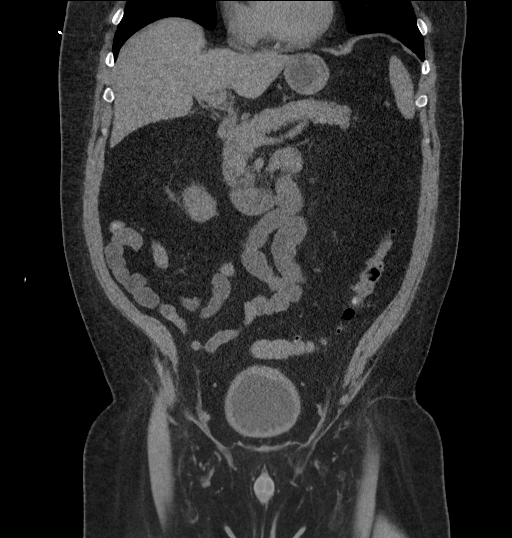
[im 113/204  soft-tissue]
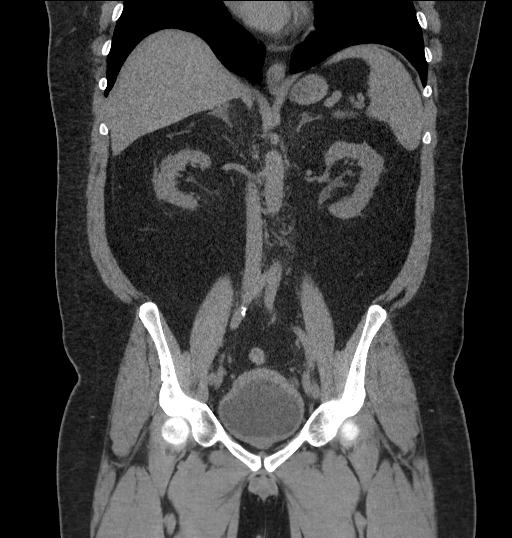

[17 of 46 positions shown; findings below may reference images not displayed]

FINDINGS: Lower chest:  No contributory findings.

Hepatobiliary: No focal liver abnormality.No evidence of biliary
obstruction or stone.

Pancreas: Unremarkable.

Spleen: Unremarkable.

Adrenals/Urinary Tract: 12 mm right adrenal nodule, stable and again
most likely an adenoma. No hydronephrosis or stone. Chronic
circumferentially thick walled bladder with diverticula. There is
history of BPH and urethral dilatation.

Stomach/Bowel: No obstruction. Appendectomy with small residual.
Mild left colonic diverticulosis. Tiny low densities in the lower
pole right kidney too small for densitometry. Left renal cystic
density.

Vascular/Lymphatic: No acute vascular abnormality. Atherosclerotic
calcifications. No mass or adenopathy.

Reproductive:Nonspecific prostatic calcification. Seminal vesicles
displaced to the right by bladder diverticulum.

Other: No ascites or pneumoperitoneum.  Small fatty umbilical hernia

Musculoskeletal: No acute abnormalities.
IMPRESSION: 1. No visible pancreatitis or biliary calculus.
2. Chronic urinary outlet obstruction with bladder diverticula.
3. Stable from CT last month.

## 2020-03-26 ENCOUNTER — Telehealth: Payer: Self-pay | Admitting: Internal Medicine

## 2020-03-26 NOTE — Telephone Encounter (Signed)
Done erx 

## 2020-03-28 NOTE — Telephone Encounter (Signed)
Patient's wife calling today to find out about refill for Testosterone 50 MG/5GM (1%) PLACE 5 GRAMS ONTO THE SKIN DAILY   [facility] [place], [place] - [address] Phone:  [phone]  Fax:  [phone]     [facility]

## 2020-03-29 NOTE — Telephone Encounter (Signed)
Key: [id number]

## 2020-03-29 NOTE — Telephone Encounter (Signed)
PA started.   Key: [id number]

## 2020-04-02 ENCOUNTER — Telehealth: Payer: Self-pay

## 2020-04-02 MED ORDER — TESTOSTERONE 50 MG/5GM (1%) TD GEL: 5.0000 g | Freq: Every day | TRANSDERMAL | 1 refills | Status: DC

## 2020-04-02 NOTE — Telephone Encounter (Signed)
Not sure if I got the right one but this one said preferred on his insurance

## 2020-04-02 NOTE — Telephone Encounter (Signed)
Sent to Dr. Adriane to advise. 

## 2020-04-02 NOTE — Telephone Encounter (Signed)
New message    Pt c/o medication issue:  1. Name of Medication: testosterone (ANDROGEL) 50 MG/5GM (1%) GEL  2. How are you currently taking this medication (dosage and times per day)? One time a day  3. Are you having a reaction (difficulty breathing--STAT)? No   4. What is your medication issue? The cost is $ 400.00 a month  5. An alternative medication is Company name is [employer] ---andro gel packet 50 mg 1 oz - Code # [id number] -- insurance will cover medication   6. testosterone (ANDROGEL) 50 MG/5GM (1%) GEL

## 2020-04-07 ENCOUNTER — Other Ambulatory Visit: Payer: Self-pay | Admitting: Internal Medicine

## 2020-04-10 ENCOUNTER — Telehealth: Payer: Self-pay | Admitting: Internal Medicine

## 2020-04-10 NOTE — Telephone Encounter (Signed)
New message:   1.Medication Requested: oxyCODONE-acetaminophen (PERCOCET/ROXICET) 5-325 MG tablet 2. Pharmacy (Name, Street, [place]): [facility] - [place], [place] - [address] 3. On Med List: Yes  4. Last Visit with PCP:  01/18/20  5. Next visit date with PCP: 07/20/20   Agent: Please be advised that RX refills may take up to 3 business days. We ask that you follow-up with your pharmacy.

## 2020-04-11 MED ORDER — OXYCODONE-ACETAMINOPHEN 5-325 MG PO TABS: 1.0000 | ORAL_TABLET | Freq: Every day | ORAL | 0 refills | Status: DC | PRN

## 2020-04-11 NOTE — Telephone Encounter (Signed)
Sent to Dr. Kaikoa. 

## 2020-04-11 NOTE — Telephone Encounter (Signed)
Done erx 

## 2020-04-16 ENCOUNTER — Other Ambulatory Visit: Payer: Self-pay | Admitting: Internal Medicine

## 2020-04-16 NOTE — Telephone Encounter (Signed)
Please refill as per office routine med refill policy (all routine meds refilled for 3 mo or monthly per pt preference up to one year from last visit, then month to month grace period for 3 mo, then further med refills will have to be denied)  

## 2020-04-17 ENCOUNTER — Other Ambulatory Visit: Payer: Self-pay | Admitting: Internal Medicine

## 2020-04-17 NOTE — Telephone Encounter (Signed)
,  Please refill as per office routine med refill policy (all routine meds refilled for 3 mo or monthly per pt preference up to one year from last visit, then month to month grace period for 3 mo, then further med refills will have to be denied)  

## 2020-05-08 ENCOUNTER — Telehealth: Payer: Self-pay | Admitting: Internal Medicine

## 2020-05-08 NOTE — Telephone Encounter (Signed)
    1.Medication Requested:oxyCODONE-acetaminophen (PERCOCET/ROXICET) 5-325 MG tablet  2. Pharmacy (Name, Street, [place]):[facility] - [place], [place] - [address]  3. On Med List: yes  4. Last Visit with PCP: 01/18/20  5. Next visit date with PCP:07/20/20  Agent: Please be advised that RX refills may take up to 3 business days. We ask that you follow-up with your pharmacy.

## 2020-05-09 MED ORDER — OXYCODONE-ACETAMINOPHEN 5-325 MG PO TABS: 1.0000 | ORAL_TABLET | Freq: Every day | ORAL | 0 refills | Status: DC | PRN

## 2020-05-09 NOTE — Telephone Encounter (Signed)
Done erx 

## 2020-06-04 ENCOUNTER — Other Ambulatory Visit: Payer: Self-pay | Admitting: Internal Medicine

## 2020-06-04 NOTE — Telephone Encounter (Signed)
Please refill as per office routine med refill policy (all routine meds refilled for 3 mo or monthly per pt preference up to one year from last visit, then month to month grace period for 3 mo, then further med refills will have to be denied)  

## 2020-06-06 ENCOUNTER — Telehealth: Payer: Self-pay | Admitting: Internal Medicine

## 2020-06-06 NOTE — Telephone Encounter (Signed)
oxyCODONE-acetaminophen (PERCOCET/ROXICET) 5-325 MG tablet  [facility] [place], [place] - [address] Phone:  [phone]  Fax:  [phone]

## 2020-06-11 MED ORDER — OXYCODONE-ACETAMINOPHEN 5-325 MG PO TABS: 1.0000 | ORAL_TABLET | Freq: Every day | ORAL | 0 refills | Status: DC | PRN

## 2020-06-11 NOTE — Telephone Encounter (Signed)
Sent to Dr. Erven. 

## 2020-06-20 ENCOUNTER — Telehealth: Payer: Self-pay | Admitting: Internal Medicine

## 2020-06-20 DIAGNOSIS — R300 Dysuria: Secondary | ICD-10-CM

## 2020-06-20 NOTE — Telephone Encounter (Signed)
Patients wife calling stating patient believes he has a UTI and was wondering if he could go to the lab to get a urine test done.

## 2020-06-25 NOTE — Telephone Encounter (Signed)
Ok lab ordered 

## 2020-06-25 NOTE — Telephone Encounter (Signed)
Sent to Dr. [place]. 

## 2020-07-09 ENCOUNTER — Other Ambulatory Visit: Payer: Self-pay | Admitting: Internal Medicine

## 2020-07-10 ENCOUNTER — Telehealth: Payer: Self-pay | Admitting: Internal Medicine

## 2020-07-10 MED ORDER — OXYCODONE-ACETAMINOPHEN 5-325 MG PO TABS: 1.0000 | ORAL_TABLET | Freq: Every day | ORAL | 0 refills | Status: DC | PRN

## 2020-07-10 NOTE — Telephone Encounter (Signed)
    1.Medication Requested: oxyCODONE-acetaminophen (PERCOCET/ROXICET) 5-325 MG tablet  2. Pharmacy (Name, Street, City):[facility] - [place], [place] - [address]  3. On Med List: yes  4. Last Visit with PCP: 01/18/20  5. Next visit date with PCP:07/20/20   Agent: Please be advised that RX refills may take up to 3 business days. We ask that you follow-up with your pharmacy.  

## 2020-07-10 NOTE — Telephone Encounter (Signed)
Sent to Dr. Lucy. 

## 2020-07-10 NOTE — Telephone Encounter (Signed)
Done erx  [email or web address] reviewed  

## 2020-07-12 ENCOUNTER — Telehealth: Payer: Self-pay | Admitting: Internal Medicine

## 2020-07-12 NOTE — Telephone Encounter (Signed)
    Spouse requesting 6 month lab draw order prior to 11/19 appointment Order in Epic is expired

## 2020-07-13 NOTE — Telephone Encounter (Signed)
Sent to Dr. Dillyn. 

## 2020-07-15 ENCOUNTER — Other Ambulatory Visit: Payer: Self-pay | Admitting: Internal Medicine

## 2020-07-15 DIAGNOSIS — R000 Tachycardia, unspecified: Secondary | ICD-10-CM

## 2020-07-15 DIAGNOSIS — E291 Testicular hypofunction: Secondary | ICD-10-CM

## 2020-07-15 DIAGNOSIS — R739 Hyperglycemia, unspecified: Secondary | ICD-10-CM

## 2020-07-15 DIAGNOSIS — N183 Chronic kidney disease, stage 3 unspecified: Secondary | ICD-10-CM

## 2020-07-15 NOTE — Telephone Encounter (Signed)
Ok labs are ordered 

## 2020-07-18 ENCOUNTER — Other Ambulatory Visit (INDEPENDENT_AMBULATORY_CARE_PROVIDER_SITE_OTHER): Payer: Medicare HMO

## 2020-07-18 DIAGNOSIS — E291 Testicular hypofunction: Secondary | ICD-10-CM

## 2020-07-18 DIAGNOSIS — N183 Chronic kidney disease, stage 3 unspecified: Secondary | ICD-10-CM | POA: Diagnosis not present

## 2020-07-18 DIAGNOSIS — R739 Hyperglycemia, unspecified: Secondary | ICD-10-CM

## 2020-07-18 DIAGNOSIS — R000 Tachycardia, unspecified: Secondary | ICD-10-CM

## 2020-07-18 LAB — BASIC METABOLIC PANEL
BUN: 18 mg/dL (ref 6–23)
CO2: 30 mEq/L (ref 19–32)
Calcium: 9.3 mg/dL (ref 8.4–10.5)
Chloride: 99 mEq/L (ref 96–112)
Creatinine, Ser: 1.45 mg/dL (ref 0.40–1.50)
GFR: 55.07 mL/min — ABNORMAL LOW (ref >60.00)
Glucose, Bld: 100 mg/dL — ABNORMAL HIGH (ref 70–99)
Potassium: 3.4 mEq/L — ABNORMAL LOW (ref 3.5–5.1)
Sodium: 140 mEq/L (ref 135–145)

## 2020-07-18 LAB — TSH: TSH: 1.15 u[IU]/mL (ref 0.35–4.50)

## 2020-07-18 LAB — HEMOGLOBIN A1C: Hgb A1c MFr Bld: 5.4 % (ref 4.6–6.5)

## 2020-07-18 LAB — LIPID PANEL
Cholesterol: 154 mg/dL (ref 0–200)
HDL: 43.6 mg/dL (ref >39.00)
LDL Cholesterol: 86 mg/dL (ref 0–99)
NonHDL: 110.65
Total CHOL/HDL Ratio: 4
Triglycerides: 124 mg/dL (ref 0.0–149.0)
VLDL: 24.8 mg/dL (ref 0.0–40.0)

## 2020-07-18 LAB — HEPATIC FUNCTION PANEL
ALT: 45 U/L (ref 0–53)
AST: 29 U/L (ref 0–37)
Albumin: 4 g/dL (ref 3.5–5.2)
Alkaline Phosphatase: 58 U/L (ref 39–117)
Bilirubin, Direct: 0.2 mg/dL (ref 0.0–0.3)
Total Bilirubin: 1 mg/dL (ref 0.2–1.2)
Total Protein: 7 g/dL (ref 6.0–8.3)

## 2020-07-18 LAB — TESTOSTERONE: Testosterone: 279.71 ng/dL — ABNORMAL LOW (ref 300.00–890.00)

## 2020-07-18 LAB — VITAMIN D 25 HYDROXY (VIT D DEFICIENCY, FRACTURES): VITD: 45.98 ng/mL (ref 30.00–100.00)

## 2020-07-18 LAB — PHOSPHORUS: Phosphorus: 3.6 mg/dL (ref 2.3–4.6)

## 2020-07-20 ENCOUNTER — Encounter: Payer: Self-pay | Admitting: Internal Medicine

## 2020-07-20 ENCOUNTER — Other Ambulatory Visit: Payer: Self-pay

## 2020-07-20 ENCOUNTER — Ambulatory Visit (INDEPENDENT_AMBULATORY_CARE_PROVIDER_SITE_OTHER): Payer: Medicare HMO | Admitting: Internal Medicine

## 2020-07-20 DIAGNOSIS — Z0000 Encounter for general adult medical examination without abnormal findings: Secondary | ICD-10-CM | POA: Diagnosis not present

## 2020-07-20 DIAGNOSIS — E291 Testicular hypofunction: Secondary | ICD-10-CM | POA: Diagnosis not present

## 2020-07-20 DIAGNOSIS — E785 Hyperlipidemia, unspecified: Secondary | ICD-10-CM

## 2020-07-20 DIAGNOSIS — F32A Depression, unspecified: Secondary | ICD-10-CM

## 2020-07-20 DIAGNOSIS — I10 Essential (primary) hypertension: Secondary | ICD-10-CM

## 2020-07-20 DIAGNOSIS — R739 Hyperglycemia, unspecified: Secondary | ICD-10-CM | POA: Diagnosis not present

## 2020-07-20 DIAGNOSIS — R69 Illness, unspecified: Secondary | ICD-10-CM | POA: Diagnosis not present

## 2020-07-20 DIAGNOSIS — N183 Chronic kidney disease, stage 3 unspecified: Secondary | ICD-10-CM | POA: Diagnosis not present

## 2020-07-20 LAB — PTH, INTACT AND CALCIUM
Calcium: 9.5 mg/dL (ref 8.6–10.3)
PTH: 27 pg/mL (ref 14–64)

## 2020-07-20 MED ORDER — EZETIMIBE 10 MG PO TABS: 10.0000 mg | ORAL_TABLET | Freq: Every day | ORAL | 3 refills | Status: DC

## 2020-07-20 MED ORDER — AMLODIPINE BESYLATE 10 MG PO TABS: 10.0000 mg | ORAL_TABLET | Freq: Every day | ORAL | 3 refills | Status: DC

## 2020-07-20 MED ORDER — DULOXETINE HCL 60 MG PO CPEP: 60.0000 mg | ORAL_CAPSULE | Freq: Every day | ORAL | 3 refills | Status: DC

## 2020-07-20 NOTE — Progress Notes (Signed)
Subjective:    Patient ID: Darren Black, male    DOB: 1966-05-16, 54 y.o.   MRN: [id number]  HPI  Here to f/u; overall doing ok,  Pt denies chest pain, increasing sob or doe, wheezing, orthopnea, PND, increased LE swelling, palpitations, dizziness or syncope.  Pt denies new neurological symptoms such as new headache, or facial or extremity weakness or numbness.  Pt denies polydipsia, polyuria, or low sugar episode.  Pt states overall good compliance with meds, mostly trying to follow appropriate diet, with Lost wt with better diet, also more depressed  Wt Readings from Last 3 Encounters:  07/20/20 253 lb (114.8 kg)  01/18/20 275 lb (124.7 kg)  07/21/19 276 lb (125.2 kg)   BP Readings from Last 3 Encounters:  07/20/20 (!) 130/100  01/18/20 (!) 160/98  07/21/19 128/86  has had mild worsening depressive symptoms on reduced cymbalta, asks to increase again, but no suicidal ideation, or panic; has ongoing anxiety, not increased recently. .  Due for flu shot Past Medical History:  Diagnosis Date  . Anxiety   . BPH (benign prostatic hypertrophy)   . Chronic lower back pain    nerve damage w/ leg pain  . Depression   . Diarrhea 03/23/2019  . Family history of adverse reaction to anesthesia    MOTHER--- HARD TO WAKE  . History of panic attacks   . Hyperlipidemia   . Hypertension   . Meatal stenosis   . OSA on CPAP    moderate to severe OSA  per study 04-28-2007 uses CPAP  . PONV (postoperative nausea and vomiting)    Past Surgical History:  Procedure Laterality Date  . APPENDECTOMY  2003  . BREATH TEK H PYLORI  04/27/2012   Procedure: BREATH TEK H PYLORI;  Surgeon: Valarie Merino, MD;  Location: [facility];  Service: General;  Laterality: N/A;  . CHOLECYSTECTOMY N/A 03/24/2019   Procedure: LAPAROSCOPIC CHOLECYSTECTOMY WITH INTRAOPERATIVE CHOLANGIOGRAM;  Surgeon: Manus Rudd, MD;  Location: [facility] OR;  Service: General;  Laterality: N/A;  . CYSTOSCOPY WITH URETHRAL DILATATION N/A  04/12/2015   Procedure: CYSTOSCOPY WITH URETHRAL DILATATION, RETROGRADE AND URETHROGRAM WITH BLADDER BIOPSY;  Surgeon: Bjorn Pippin, MD;  Location: [facility] [place];  Service: Urology;  Laterality: N/A;  . ROTATOR CUFF REPAIR Right 01-21-2012  . TRANSTHORACIC ECHOCARDIOGRAM  03-26-2007   normal LVSF, ef 60%,  mild AV calcification without stenosis,  mild MR and TR,  mild LAE  . URETHROGRAM N/A 04/12/2015   Procedure: URETHROGRAM;  Surgeon: Bjorn Pippin, MD;  Location: [facility];  Service: Urology;  Laterality: N/A;    reports that he has never smoked. He has never used smokeless tobacco. He reports current alcohol use. He reports that he does not use drugs. family history includes Cancer in his father; Coronary artery disease in his father; Diabetes in an other family member; Hyperlipidemia in an other family member; Hypertension in an other family member; Stroke in an other family member. Allergies  Allergen Reactions  . Crestor [Rosuvastatin Calcium] Other (See Comments)    abd pain   Current Outpatient Medications on File Prior to Visit  Medication Sig Dispense Refill  . benazepril (LOTENSIN) 40 MG tablet Take 1 tablet by mouth once daily 90 tablet 0  . colestipol (COLESTID) 5 g granules Take 5 g by mouth 2 (two) times daily. 500 g 2  . doxazosin (CARDURA) 4 MG tablet TAKE 1 TABLET BY MOUTH AT BEDTIME 90 tablet 1  . fluticasone (FLONASE)  50 MCG/ACT nasal spray Use 2 spray(s) in each nostril once daily 16 g 11  . hydrochlorothiazide (HYDRODIURIL) 25 MG tablet Take 0.5 tablets (12.5 mg total) by mouth daily. 45 tablet 3  . LORazepam (ATIVAN) 1 MG tablet Take 1 tablet by mouth twice daily as needed for anxiety 60 tablet 5  . oxyCODONE-acetaminophen (PERCOCET/ROXICET) 5-325 MG tablet Take 1 tablet by mouth daily as needed for severe pain. 30 tablet 0  . potassium chloride (KLOR-CON 10) 10 MEQ tablet Take 1 tablet (10 mEq total) by mouth daily. 90 tablet 3  . pravastatin  (PRAVACHOL) 80 MG tablet Take 1 tablet by mouth once daily 90 tablet 1  . rosuvastatin (CRESTOR) 20 MG tablet Take 1 tablet (20 mg total) by mouth daily. 90 tablet 3  . tamsulosin (FLOMAX) 0.4 MG CAPS capsule Take 1 capsule by mouth once daily 90 capsule 1  . testosterone (ANDROGEL) 50 MG/5GM (1%) GEL Place 5 g onto the skin daily. 450 g 1  . triamcinolone (NASACORT) 55 MCG/ACT AERO nasal inhaler Place 2 sprays into the nose daily. 1 Inhaler 12   No current facility-administered medications on file prior to visit.   Review of Systems All otherwise neg per pt    Objective:   Physical Exam BP (!) 130/100 (BP Location: Left Arm, Patient Position: Sitting, Cuff Size: Large)   Pulse 87   Temp 98 F (36.7 C) (Oral)   Ht 5\' 7"  (1.702 m)   Wt 253 lb (114.8 kg)   SpO2 98%   BMI 39.63 kg/m  VS noted,  Constitutional: Pt appears in NAD HENT: Head: NCAT.  Right Ear: External ear normal.  Left Ear: External ear normal.  Eyes: . Pupils are equal, round, and reactive to light. Conjunctivae and EOM are normal Nose: without d/c or deformity Neck: Neck supple. Gross normal ROM Cardiovascular: Normal rate and regular rhythm.   Pulmonary/Chest: Effort normal and breath sounds without rales or wheezing.  Abd:  Soft, NT, ND, + BS, no organomegaly Neurological: Pt is alert. At baseline orientation, motor grossly intact Skin: Skin is warm. No rashes, other new lesions, no LE edema Psychiatric: Pt behavior is normal without agitation , depressed affect Lab Results  Component Value Date   WBC 6.3 01/18/2020   HGB 15.1 01/18/2020   HCT 45.0 01/18/2020   PLT 172.0 01/18/2020   GLUCOSE 100 (H) 07/18/2020   CHOL 154 07/18/2020   TRIG 124.0 07/18/2020   HDL 43.60 07/18/2020   LDLDIRECT 139.0 01/02/2016   LDLCALC 86 07/18/2020   ALT 45 07/18/2020   AST 29 07/18/2020   NA 140 07/18/2020   K 3.4 (L) 07/18/2020   CL 99 07/18/2020   CREATININE 1.45 07/18/2020   BUN 18 07/18/2020   CO2 30  07/18/2020   TSH 1.15 07/18/2020   PSA 1.10 01/18/2020   HGBA1C 5.4 07/18/2020      Assessment & Plan:

## 2020-07-20 NOTE — Patient Instructions (Signed)
You had the flu shot today  Ok to increase the cymbalta to 60 mg per day  Ok to increase the amlodipine to 10 mg per day  Please take all new medication as prescribed - the zetia 10 mg per day   Please continue all other medications as before, and refills have been done if requested.  Please have the pharmacy call with any other refills you may need.  Please continue your efforts at being more active, low cholesterol diet, and weight control  Please keep your appointments with your specialists as you may have planned  Please make an Appointment to return in 6 months, or sooner if needed, also with Lab Appointment for testing done 3-5 days before at the FIRST FLOOR Lab (so this is for TWO appointments - please see the scheduling desk as you leave)

## 2020-07-21 ENCOUNTER — Encounter: Payer: Self-pay | Admitting: Internal Medicine

## 2020-07-21 NOTE — Assessment & Plan Note (Signed)
stable overall by history and exam, recent data reviewed with pt, and pt to continue medical treatment as before,  to f/u any worsening symptoms or concerns  

## 2020-07-21 NOTE — Assessment & Plan Note (Signed)
With mild persistent lows, cont same tx for now

## 2020-07-21 NOTE — Assessment & Plan Note (Addendum)
Some improved, encouraged to cont wt loss  I spent 41 minutes in preparing to see the patient by review of recent labs, imaging and procedures, obtaining and reviewing separately obtained history, communicating with the patient and family or caregiver, ordering medications, tests or procedures, and documenting clinical information in the EHR including the differential Dx, treatment, and any further evaluation and other management of obesity, hypogoand, hld, hyperglycemia, htn, ckd, depression

## 2020-07-21 NOTE — Assessment & Plan Note (Signed)
With mild worsening, for increased cymbalta 60 qd

## 2020-07-21 NOTE — Assessment & Plan Note (Signed)
Mild uncontrolled, to add zetia 10 qd

## 2020-07-21 NOTE — Assessment & Plan Note (Signed)
Mild uncontrolled, to increase the amldoipine to 10 qd

## 2020-08-07 ENCOUNTER — Ambulatory Visit (INDEPENDENT_AMBULATORY_CARE_PROVIDER_SITE_OTHER): Payer: Medicare HMO

## 2020-08-07 ENCOUNTER — Other Ambulatory Visit: Payer: Self-pay

## 2020-08-07 VITALS — BP 122/78 | HR 76 | Temp 98.0°F | Ht 67.0 in | Wt 256.0 lb

## 2020-08-07 DIAGNOSIS — Z0000 Encounter for general adult medical examination without abnormal findings: Secondary | ICD-10-CM

## 2020-08-07 NOTE — Progress Notes (Signed)
Subjective:   Nash ShearerJohn Roebuck is a 54 y.o. male who presents for Medicare Annual/Subsequent preventive examination.  Review of Systems    No ROS. Medicare Wellness Visit. Additional risk factors are reflected in social history. Cardiac Risk Factors include: dyslipidemia;family history of premature cardiovascular disease;hypertension;male gender;obesity (BMI >30kg/m2)     Objective:    Today's Vitals   08/07/20 1411  BP: 122/78  Pulse: 76  Temp: 98 F (36.7 C)  SpO2: 97%  Weight: 256 lb (116.1 kg)  Height: 5\' 7"  (1.702 m)  PainSc: 7    Body mass index is 40.1 kg/m.  Advanced Directives 08/07/2020 03/23/2019 03/22/2019 02/05/2019 04/12/2015 12/05/2014  Does Patient Have a Medical Advance Directive? No - No No No No  Would patient like information on creating a medical advance directive? No - Patient declined No - Patient declined No - Patient declined - No - patient declined information -    Current Medications (verified) Outpatient Encounter Medications as of 08/07/2020  Medication Sig  . amLODipine (NORVASC) 10 MG tablet Take 1 tablet (10 mg total) by mouth daily.  . benazepril (LOTENSIN) 40 MG tablet Take 1 tablet by mouth once daily  . colestipol (COLESTID) 5 g granules Take 5 g by mouth 2 (two) times daily.  Marland Kitchen. doxazosin (CARDURA) 4 MG tablet TAKE 1 TABLET BY MOUTH AT BEDTIME  . DULoxetine (CYMBALTA) 60 MG capsule Take 1 capsule (60 mg total) by mouth daily.  Marland Kitchen. ezetimibe (ZETIA) 10 MG tablet Take 1 tablet (10 mg total) by mouth daily.  . fluticasone (FLONASE) 50 MCG/ACT nasal spray Use 2 spray(s) in each nostril once daily  . hydrochlorothiazide (HYDRODIURIL) 25 MG tablet Take 0.5 tablets (12.5 mg total) by mouth daily.  Marland Kitchen. LORazepam (ATIVAN) 1 MG tablet Take 1 tablet by mouth twice daily as needed for anxiety  . oxyCODONE-acetaminophen (PERCOCET/ROXICET) 5-325 MG tablet Take 1 tablet by mouth daily as needed for severe pain.  . potassium chloride (KLOR-CON 10) 10 MEQ tablet  Take 1 tablet (10 mEq total) by mouth daily.  . pravastatin (PRAVACHOL) 80 MG tablet Take 1 tablet by mouth once daily  . rosuvastatin (CRESTOR) 20 MG tablet Take 1 tablet (20 mg total) by mouth daily.  . tamsulosin (FLOMAX) 0.4 MG CAPS capsule Take 1 capsule by mouth once daily  . testosterone (ANDROGEL) 50 MG/5GM (1%) GEL Place 5 g onto the skin daily.  Marland Kitchen. triamcinolone (NASACORT) 55 MCG/ACT AERO nasal inhaler Place 2 sprays into the nose daily.   No facility-administered encounter medications on file as of 08/07/2020.    Allergies (verified) Crestor [rosuvastatin calcium]   History: Past Medical History:  Diagnosis Date  . Anxiety   . BPH (benign prostatic hypertrophy)   . Chronic lower back pain    nerve damage w/ leg pain  . Depression   . Diarrhea 03/23/2019  . Family history of adverse reaction to anesthesia    MOTHER--- HARD TO WAKE  . History of panic attacks   . Hyperlipidemia   . Hypertension   . Meatal stenosis   . OSA on CPAP    moderate to severe OSA  per study 04-28-2007 uses CPAP  . PONV (postoperative nausea and vomiting)    Past Surgical History:  Procedure Laterality Date  . APPENDECTOMY  2003  . BREATH TEK H PYLORI  04/27/2012   Procedure: BREATH TEK H PYLORI;  Surgeon: Valarie MerinoMatthew B Martin, MD;  Location: [facility];  Service: General;  Laterality: N/A;  . CHOLECYSTECTOMY N/A  03/24/2019   Procedure: LAPAROSCOPIC CHOLECYSTECTOMY WITH INTRAOPERATIVE CHOLANGIOGRAM;  Surgeon: Manus Rudd, MD;  Location: [facility];  Service: General;  Laterality: N/A;  . CYSTOSCOPY WITH URETHRAL DILATATION N/A 04/12/2015   Procedure: CYSTOSCOPY WITH URETHRAL DILATATION, RETROGRADE AND URETHROGRAM WITH BLADDER BIOPSY;  Surgeon: Bjorn Pippin, MD;  Location: [facility];  Service: Urology;  Laterality: N/A;  . ROTATOR CUFF REPAIR Right 01-21-2012  . TRANSTHORACIC ECHOCARDIOGRAM  03-26-2007   normal LVSF, ef 60%,  mild AV calcification without stenosis,  mild MR and TR,   mild LAE  . URETHROGRAM N/A 04/12/2015   Procedure: URETHROGRAM;  Surgeon: Bjorn Pippin, MD;  Location: [facility];  Service: Urology;  Laterality: N/A;   Family History  Problem Relation Age of Onset  . Coronary artery disease Father   . Cancer Father        anaplastic thyroid cancer  . Stroke Other        1st degree relative  . Diabetes Other        1st degree relative  . Hypertension Other   . Hyperlipidemia Other    Social History   Socioeconomic History  . Marital status: Married    Spouse name: Not on file  . Number of children: Not on file  . Years of education: Not on file  . Highest education level: Not on file  Occupational History  . Occupation: disabled, LBP and anxiety  Tobacco Use  . Smoking status: Never Smoker  . Smokeless tobacco: Never Used  Vaping Use  . Vaping Use: Never used  Substance and Sexual Activity  . Alcohol use: Yes    Comment: rare  . Drug use: No  . Sexual activity: Not on file  Other Topics Concern  . Not on file  Social History Narrative  . Not on file   Social Determinants of Health   Financial Resource Strain: Low Risk   . Difficulty of Paying Living Expenses: Not hard at all  Food Insecurity: No Food Insecurity  . Worried About Programme researcher, broadcasting/film/video in the Last Year: Never true  . Ran Out of Food in the Last Year: Never true  Transportation Needs: No Transportation Needs  . Lack of Transportation (Medical): No  . Lack of Transportation (Non-Medical): No  Physical Activity: Sufficiently Active  . Days of Exercise per Week: 5 days  . Minutes of Exercise per Session: 30 min  Stress: No Stress Concern Present  . Feeling of Stress : Not at all  Social Connections: Moderately Isolated  . Frequency of Communication with Friends and Family: More than three times a week  . Frequency of Social Gatherings with Friends and Family: Once a week  . Attends Religious Services: Never  . Active Member of Clubs or Organizations:  No  . Attends Banker Meetings: Never  . Marital Status: Married    Tobacco Counseling Counseling given: Not Answered   Clinical Intake:  Pre-visit preparation completed: Yes  Pain : 0-10 Pain Score: 7  Pain Type: Chronic pain Pain Location: Back Pain Orientation: Lower Pain Descriptors / Indicators: Aching, Constant, Discomfort, Dull Pain Onset: More than a month ago Pain Frequency: Constant Pain Relieving Factors: Pain medication Effect of Pain on Daily Activities: Pain can diminish job performance, lower motivation to exercise, and prevent you from completing daily tasks.  Pain Relieving Factors: Pain medication  BMI - recorded: 40.1 Nutritional Status: BMI > 30  Obese Nutritional Risks: None Diabetes: No CBG done?: No Did  pt. bring in CBG monitor from home?: No  How often do you need to have someone help you when you read instructions, pamphlets, or other written materials from your doctor or pharmacy?: 1 - Never What is the last grade level you completed in school?: Junior High School  Diabetic? no  Interpreter Needed?: No  Information entered by :: Susie Cassette, [place]   Activities of Daily Living In your present state of health, do you have any difficulty performing the following activities: 08/07/2020  Hearing? N  Vision? N  Difficulty concentrating or making decisions? N  Walking or climbing stairs? N  Dressing or bathing? N  Doing errands, shopping? N  Preparing Food and eating ? N  Using the Toilet? N  In the past six months, have you accidently leaked urine? N  Do you have problems with loss of bowel control? N  Managing your Medications? N  Managing your Finances? N  Housekeeping or managing your Housekeeping? N  Some recent data might be hidden    Patient Care Team: Corwin Levins, MD as PCP - General  Indicate any recent Medical Services you may have received from other than [facility] providers in the past year (date may be  approximate).     Assessment:   This is a routine wellness examination for [place].  Hearing/Vision screen No exam data present  Dietary issues and exercise activities discussed: Current Exercise Habits: Structured exercise class, Type of exercise: strength training/weights;stretching;treadmill;walking, Time (Minutes): 35, Frequency (Times/Week): 5, Weekly Exercise (Minutes/Week): 175, Intensity: Moderate, Exercise limited by: None identified  Goals    .  Patient Stated (pt-stated)      To live another day.      Depression Screen PHQ 2/9 Scores 08/07/2020 01/18/2020 01/18/2020 01/11/2019 01/26/2018 07/28/2017 01/02/2016  PHQ - 2 Score 1 0 0 0 0 0 1  PHQ- 9 Score - - - - - 0 -    Fall Risk Fall Risk  08/07/2020 01/18/2020 01/18/2020 01/11/2019 01/26/2018  Falls in the past year? 0 0 0 0 No  Number falls in past yr: 0 - 0 - -  Injury with Fall? 0 - 0 - -  Risk for fall due to : No Fall Risks - History of fall(s) - -  Follow up Falls evaluation completed;Education provided - Falls evaluation completed - -    FALL RISK PREVENTION PERTAINING TO THE HOME:  Any stairs in or around the home? Yes  If so, are there any without handrails? No  Home free of loose throw rugs in walkways, pet beds, electrical cords, etc? Yes  Adequate lighting in your home to reduce risk of falls? Yes   ASSISTIVE DEVICES UTILIZED TO PREVENT FALLS:  Life alert? No  Use of a cane, walker or w/c? No  Grab bars in the bathroom? No  Shower chair or bench in shower? Yes  Elevated toilet seat or a handicapped toilet? Yes   TIMED UP AND GO:  Was the test performed? No .  Length of time to ambulate 10 feet: 0 sec.   Gait steady and fast without use of assistive device  Cognitive Function: Normal cognitive status assessed by direct observation by this Nurse Health Advisor. No abnormalities found.         Immunizations Immunization History  Administered Date(s) Administered  . Influenza Split 08/28/2011  .  Influenza,inj,Quad PF,6+ Mos 07/28/2017, 07/12/2018, 05/05/2019  . PFIZER SARS-COV-2 Vaccination 10/13/2019, 11/07/2019, 05/28/2020  . Tdap 01/21/2017    TDAP status: Up  to date  Flu Vaccine status: Up to date  Pneumococcal vaccine status: Due, Education has been provided regarding the importance of this vaccine. Advised may receive this vaccine at local pharmacy or Health Dept. Aware to provide a copy of the vaccination record if obtained from local pharmacy or Health Dept. Verbalized acceptance and understanding.  Covid-19 vaccine status: Completed vaccines  Qualifies for Shingles Vaccine? No   Zostavax completed No   Shingrix Completed?: No.    Education has been provided regarding the importance of this vaccine. Patient has been advised to call insurance company to determine out of pocket expense if they have not yet received this vaccine. Advised may also receive vaccine at local pharmacy or Health Dept. Verbalized acceptance and understanding.  Screening Tests Health Maintenance  Topic Date Due  . Hepatitis C Screening  Never done  . INFLUENZA VACCINE  04/01/2020  . Fecal DNA (Cologuard)  02/10/2021  . TETANUS/TDAP  01/22/2027  . COVID-19 Vaccine  Completed  . HIV Screening  Completed    Health Maintenance  Health Maintenance Due  Topic Date Due  . Hepatitis C Screening  Never done  . INFLUENZA VACCINE  04/01/2020    Colorectal cancer screening: Type of screening: Cologuard. Completed 02/10/2018. Repeat every 3 years  Lung Cancer Screening: (Low Dose CT Chest recommended if Age 67-80 years, 30 pack-year currently smoking OR have quit w/in 15years.) does not qualify.   Lung Cancer Screening Referral: no  Additional Screening:  Hepatitis C Screening: does qualify; Completed no  Vision Screening: Recommended annual ophthalmology exams for early detection of glaucoma and other disorders of the eye. Is the patient up to date with their annual eye exam?  Yes  Who is the  provider or what is the name of the office in which the patient attends annual eye exams? [facility] O.D. If pt is not established with a provider, would they like to be referred to a provider to establish care? No .   Dental Screening: Recommended annual dental exams for proper oral hygiene  Community Resource Referral / Chronic Care Management: CRR required this visit?  No   CCM required this visit?  No      Plan:     I have personally reviewed and noted the following in the patient's chart:   . Medical and social history . Use of alcohol, tobacco or illicit drugs  . Current medications and supplements . Functional ability and status . Nutritional status . Physical activity . Advanced directives . List of other physicians . Hospitalizations, surgeries, and ER visits in previous 12 months . Vitals . Screenings to include cognitive, depression, and falls . Referrals and appointments  In addition, I have reviewed and discussed with patient certain preventive protocols, quality metrics, and best practice recommendations. A written personalized care plan for preventive services as well as general preventive health recommendations were provided to patient.     Mickeal Needy, LPN   20/10/5425   Nurse Notes: n/a

## 2020-08-07 NOTE — Patient Instructions (Signed)
Darren Black , Thank you for taking time to come for your Medicare Wellness Visit. I appreciate your ongoing commitment to your health goals. Please review the following plan we discussed and let me know if I can assist you in the future.   Screening recommendations/referrals: Colonoscopy: Cologuard done 02/10/2018 Recommended yearly ophthalmology/optometry visit for glaucoma screening and checkup Recommended yearly dental visit for hygiene and checkup  Vaccinations: Influenza vaccine: up to date Pneumococcal vaccine: due at age 43 Tdap vaccine: 01/21/2017; due every 10 years Shingles vaccine: not done   Covid-19: up to date  Advanced directives: Advance directive discussed with you today. Even though you declined this today please call our office should you change your mind and we can give you the proper paperwork for you to fill out.  Conditions/risks identified: Yes; Reviewed health maintenance screenings with patient today and relevant education, vaccines, and/or referrals were provided. Please continue to do your personal lifestyle choices by: daily care of teeth and gums, regular physical activity (goal should be 5 days a week for 30 minutes), eat a healthy diet, avoid tobacco and drug use, limiting any alcohol intake, taking a low-dose aspirin (if not allergic or have been advised by your provider otherwise) and taking vitamins and minerals as recommended by your provider. Continue doing brain stimulating activities (puzzles, reading, adult coloring books, staying active) to keep memory sharp. Continue to eat heart healthy diet (full of fruits, vegetables, whole grains, lean protein, water--limit salt, fat, and sugar intake) and increase physical activity as tolerated.  Next appointment: Please schedule your next Medicare Wellness Visit with your Nurse Health Advisor in 1 year by calling [phone].  Preventive Care 61 Years and Older, Male Preventive care refers to lifestyle choices and  visits with your health care provider that can promote health and wellness. What does preventive care include?  A yearly physical exam. This is also called an annual well check.  Dental exams once or twice a year.  Routine eye exams. Ask your health care provider how often you should have your eyes checked.  Personal lifestyle choices, including:  Daily care of your teeth and gums.  Regular physical activity.  Eating a healthy diet.  Avoiding tobacco and drug use.  Limiting alcohol use.  Practicing safe sex.  Taking low doses of aspirin every day.  Taking vitamin and mineral supplements as recommended by your health care provider. What happens during an annual well check? The services and screenings done by your health care provider during your annual well check will depend on your age, overall health, lifestyle risk factors, and family history of disease. Counseling  Your health care provider may ask you questions about your:  Alcohol use.  Tobacco use.  Drug use.  Emotional well-being.  Home and relationship well-being.  Sexual activity.  Eating habits.  History of falls.  Memory and ability to understand (cognition).  Work and work Astronomer. Screening  You may have the following tests or measurements:  Height, weight, and BMI.  Blood pressure.  Lipid and cholesterol levels. These may be checked every 5 years, or more frequently if you are over 8 years old.  Skin check.  Lung cancer screening. You may have this screening every year starting at age 62 if you have a 30-pack-year history of smoking and currently smoke or have quit within the past 15 years.  Fecal occult blood test (FOBT) of the stool. You may have this test every year starting at age 4.  Flexible sigmoidoscopy or  colonoscopy. You may have a sigmoidoscopy every 5 years or a colonoscopy every 10 years starting at age 70.  Prostate cancer screening. Recommendations will vary  depending on your family history and other risks.  Hepatitis C blood test.  Hepatitis B blood test.  Sexually transmitted disease (STD) testing.  Diabetes screening. This is done by checking your blood sugar (glucose) after you have not eaten for a while (fasting). You may have this done every 1-3 years.  Abdominal aortic aneurysm (AAA) screening. You may need this if you are a current or former smoker.  Osteoporosis. You may be screened starting at age 31 if you are at high risk. Talk with your health care provider about your test results, treatment options, and if necessary, the need for more tests. Vaccines  Your health care provider may recommend certain vaccines, such as:  Influenza vaccine. This is recommended every year.  Tetanus, diphtheria, and acellular pertussis (Tdap, Td) vaccine. You may need a Td booster every 10 years.  Zoster vaccine. You may need this after age 82.  Pneumococcal 13-valent conjugate (PCV13) vaccine. One dose is recommended after age 73.  Pneumococcal polysaccharide (PPSV23) vaccine. One dose is recommended after age 20. Talk to your health care provider about which screenings and vaccines you need and how often you need them. This information is not intended to replace advice given to you by your health care provider. Make sure you discuss any questions you have with your health care provider. Document Released: 09/14/2015 Document Revised: 05/07/2016 Document Reviewed: 06/19/2015 Elsevier Interactive Patient Education  2017 [place] Prevention in the Home Falls can cause injuries. They can happen to people of all ages. There are many things you can do to make your home safe and to help prevent falls. What can I do on the outside of my home?  Regularly fix the edges of walkways and driveways and fix any cracks.  Remove anything that might make you trip as you walk through a door, such as a raised step or threshold.  Trim any bushes  or trees on the path to your home.  Use bright outdoor lighting.  Clear any walking paths of anything that might make someone trip, such as rocks or tools.  Regularly check to see if handrails are loose or broken. Make sure that both sides of any steps have handrails.  Any raised decks and porches should have guardrails on the edges.  Have any leaves, snow, or ice cleared regularly.  Use sand or salt on walking paths during winter.  Clean up any spills in your garage right away. This includes oil or grease spills. What can I do in the bathroom?  Use night lights.  Install grab bars by the toilet and in the tub and shower. Do not use towel bars as grab bars.  Use non-skid mats or decals in the tub or shower.  If you need to sit down in the shower, use a plastic, non-slip stool.  Keep the floor dry. Clean up any water that spills on the floor as soon as it happens.  Remove soap buildup in the tub or shower regularly.  Attach bath mats securely with double-sided non-slip rug tape.  Do not have throw rugs and other things on the floor that can make you trip. What can I do in the bedroom?  Use night lights.  Make sure that you have a light by your bed that is easy to reach.  Do not use any  sheets or blankets that are too big for your bed. They should not hang down onto the floor.  Have a firm chair that has side arms. You can use this for support while you get dressed.  Do not have throw rugs and other things on the floor that can make you trip. What can I do in the kitchen?  Clean up any spills right away.  Avoid walking on wet floors.  Keep items that you use a lot in easy-to-reach places.  If you need to reach something above you, use a strong step stool that has a grab bar.  Keep electrical cords out of the way.  Do not use floor polish or wax that makes floors slippery. If you must use wax, use non-skid floor wax.  Do not have throw rugs and other things on  the floor that can make you trip. What can I do with my stairs?  Do not leave any items on the stairs.  Make sure that there are handrails on both sides of the stairs and use them. Fix handrails that are broken or loose. Make sure that handrails are as long as the stairways.  Check any carpeting to make sure that it is firmly attached to the stairs. Fix any carpet that is loose or worn.  Avoid having throw rugs at the top or bottom of the stairs. If you do have throw rugs, attach them to the floor with carpet tape.  Make sure that you have a light switch at the top of the stairs and the bottom of the stairs. If you do not have them, ask someone to add them for you. What else can I do to help prevent falls?  Wear shoes that:  Do not have high heels.  Have rubber bottoms.  Are comfortable and fit you well.  Are closed at the toe. Do not wear sandals.  If you use a stepladder:  Make sure that it is fully opened. Do not climb a closed stepladder.  Make sure that both sides of the stepladder are locked into place.  Ask someone to hold it for you, if possible.  Clearly mark and make sure that you can see:  Any grab bars or handrails.  First and last steps.  Where the edge of each step is.  Use tools that help you move around (mobility aids) if they are needed. These include:  Canes.  Walkers.  Scooters.  Crutches.  Turn on the lights when you go into a dark area. Replace any light bulbs as soon as they burn out.  Set up your furniture so you have a clear path. Avoid moving your furniture around.  If any of your floors are uneven, fix them.  If there are any pets around you, be aware of where they are.  Review your medicines with your doctor. Some medicines can make you feel dizzy. This can increase your chance of falling. Ask your doctor what other things that you can do to help prevent falls. This information is not intended to replace advice given to you by  your health care provider. Make sure you discuss any questions you have with your health care provider. Document Released: 06/14/2009 Document Revised: 01/24/2016 Document Reviewed: 09/22/2014 Elsevier Interactive Patient Education  2017 [employer].

## 2020-08-08 ENCOUNTER — Telehealth: Payer: Self-pay | Admitting: Internal Medicine

## 2020-08-08 MED ORDER — OXYCODONE-ACETAMINOPHEN 5-325 MG PO TABS: 1.0000 | ORAL_TABLET | Freq: Every day | ORAL | 0 refills | Status: DC | PRN

## 2020-08-08 NOTE — Telephone Encounter (Signed)
Done erx  pmpwarenc reviewed

## 2020-08-08 NOTE — Telephone Encounter (Signed)
Sent to Dr. Natthew. 

## 2020-08-08 NOTE — Telephone Encounter (Signed)
Patient's wife called and is requesting a med refill for oxyCODONE-acetaminophen (PERCOCET/ROXICET) 5-325 MG tablet It can be sent to [facility] - [place], [place] - [address]

## 2020-09-03 ENCOUNTER — Other Ambulatory Visit: Payer: Self-pay | Admitting: Internal Medicine

## 2020-09-10 ENCOUNTER — Telehealth: Payer: Self-pay | Admitting: Internal Medicine

## 2020-09-10 NOTE — Telephone Encounter (Signed)
    Refill request for oxyCODONE-acetaminophen (PERCOCET/ROXICET) 5-325 MG tablet Pharmacy  [facility] - [place], [place] - [address] 

## 2020-09-11 MED ORDER — OXYCODONE-ACETAMINOPHEN 5-325 MG PO TABS: 1.0000 | ORAL_TABLET | Freq: Every day | ORAL | 0 refills | Status: DC | PRN

## 2020-09-11 NOTE — Telephone Encounter (Signed)
Pt notified that prescription he requested was refilled to the pharmacy he requested.  Pt denies ques/concerns at this time.

## 2020-09-11 NOTE — Telephone Encounter (Signed)
Done erx 

## 2020-10-08 ENCOUNTER — Other Ambulatory Visit: Payer: Self-pay | Admitting: Internal Medicine

## 2020-10-08 NOTE — Telephone Encounter (Signed)
Please refill as per office routine med refill policy (all routine meds refilled for 3 mo or monthly per pt preference up to one year from last visit, then month to month grace period for 3 mo, then further med refills will have to be denied)  

## 2020-10-09 ENCOUNTER — Telehealth: Payer: Self-pay | Admitting: Internal Medicine

## 2020-10-09 MED ORDER — OXYCODONE-ACETAMINOPHEN 5-325 MG PO TABS: 1.0000 | ORAL_TABLET | Freq: Every day | ORAL | 0 refills | Status: DC | PRN

## 2020-10-09 NOTE — Telephone Encounter (Signed)
1.Medication Requested: oxyCODONE-acetaminophen (PERCOCET/ROXICET) 5-325 MG tablet   2. Pharmacy (Name, Street, [place]): [facility] - [place], [place] - [address]  3. On Med List: yes   4. Last Visit with PCP: 11.19.21  5. Next visit date with PCP: 5.6.22   Agent: Please be advised that RX refills may take up to 3 business days. We ask that you follow-up with your pharmacy.

## 2020-10-09 NOTE — Telephone Encounter (Signed)
[email or web address] reveiwed  Done erx

## 2020-11-06 ENCOUNTER — Telehealth: Payer: Self-pay | Admitting: Internal Medicine

## 2020-11-06 MED ORDER — OXYCODONE-ACETAMINOPHEN 5-325 MG PO TABS: 1.0000 | ORAL_TABLET | Freq: Every day | ORAL | 0 refills | Status: DC | PRN

## 2020-11-06 NOTE — Telephone Encounter (Signed)
Have refilled but is not due for pickup until at least 11/11/20. Also I do not see chronic pain agreement on file and would recommend for this to be done at next visit as he does appear to be on stable dosing.

## 2020-11-06 NOTE — Telephone Encounter (Signed)
Request refill for oxyCODONE-acetaminophen (PERCOCET/ROXICET) 5-325 MG tablet  Pharmacy [facility] - [place], [place] - [address]

## 2020-11-11 ENCOUNTER — Encounter: Payer: Self-pay | Admitting: Internal Medicine

## 2020-11-23 ENCOUNTER — Telehealth (INDEPENDENT_AMBULATORY_CARE_PROVIDER_SITE_OTHER): Payer: Medicare HMO | Admitting: Internal Medicine

## 2020-11-23 DIAGNOSIS — J329 Chronic sinusitis, unspecified: Secondary | ICD-10-CM

## 2020-11-23 DIAGNOSIS — I10 Essential (primary) hypertension: Secondary | ICD-10-CM

## 2020-11-23 DIAGNOSIS — R739 Hyperglycemia, unspecified: Secondary | ICD-10-CM

## 2020-11-23 MED ORDER — DOXYCYCLINE HYCLATE 100 MG PO TABS: 100.0000 mg | ORAL_TABLET | Freq: Two times a day (BID) | ORAL | 0 refills | Status: DC

## 2020-11-23 MED ORDER — BENZONATATE 200 MG PO CAPS: 200.0000 mg | ORAL_CAPSULE | Freq: Two times a day (BID) | ORAL | 0 refills | Status: DC | PRN

## 2020-11-23 MED ORDER — BENZONATATE 200 MG PO CAPS: ORAL_CAPSULE | ORAL | 1 refills | Status: DC

## 2020-11-23 NOTE — Progress Notes (Signed)
Patient ID: Darren Black, male   DOB: Mar 03, 1966, 55 y.o.   MRN: [id number]  Virtual Visit via Video Note  I connected with Darren Black on 11/23/20 at  2:20 PM EDT by a video enabled telemedicine application and verified that I am speaking with the correct person using two identifiers.  Location of all participants today Patient: at home Provider: at office   I discussed the limitations of evaluation and management by telemedicine and the availability of in person appointments. The patient expressed understanding and agreed to proceed.  History of Present Illness:  Here with 2-3 days acute onset fever, facial pain, pressure, headache, general weakness and malaise, and greenish d/c, with mild ST and cough, but pt denies chest pain, wheezing, increased sob or doe, orthopnea, PND, increased LE swelling, palpitations, dizziness or syncope.  BP at home < 140/90.   Pt denies polydipsia, polyuria, No other new complaints    Past Medical History:  Diagnosis Date  . Anxiety   . BPH (benign prostatic hypertrophy)   . Chronic lower back pain    nerve damage w/ leg pain  . Depression   . Diarrhea 03/23/2019  . Family history of adverse reaction to anesthesia    MOTHER--- HARD TO WAKE  . History of panic attacks   . Hyperlipidemia   . Hypertension   . Meatal stenosis   . OSA on CPAP    moderate to severe OSA  per study 04-28-2007 uses CPAP  . PONV (postoperative nausea and vomiting)    Past Surgical History:  Procedure Laterality Date  . APPENDECTOMY  2003  . BREATH TEK H PYLORI  04/27/2012   Procedure: BREATH TEK H PYLORI;  Surgeon: Valarie Merino, MD;  Location: [facility];  Service: General;  Laterality: N/A;  . CHOLECYSTECTOMY N/A 03/24/2019   Procedure: LAPAROSCOPIC CHOLECYSTECTOMY WITH INTRAOPERATIVE CHOLANGIOGRAM;  Surgeon: Manus Rudd, MD;  Location: [facility];  Service: General;  Laterality: N/A;  . CYSTOSCOPY WITH URETHRAL DILATATION N/A 04/12/2015   Procedure: CYSTOSCOPY WITH  URETHRAL DILATATION, RETROGRADE AND URETHROGRAM WITH BLADDER BIOPSY;  Surgeon: Bjorn Pippin, MD;  Location: [facility];  Service: Urology;  Laterality: N/A;  . ROTATOR CUFF REPAIR Right 01-21-2012  . TRANSTHORACIC ECHOCARDIOGRAM  03-26-2007   normal LVSF, ef 60%,  mild AV calcification without stenosis,  mild MR and TR,  mild LAE  . URETHROGRAM N/A 04/12/2015   Procedure: URETHROGRAM;  Surgeon: Bjorn Pippin, MD;  Location: [facility];  Service: Urology;  Laterality: N/A;    reports that he has never smoked. He has never used smokeless tobacco. He reports current alcohol use. He reports that he does not use drugs. family history includes Cancer in his father; Coronary artery disease in his father; Diabetes in an other family member; Hyperlipidemia in an other family member; Hypertension in an other family member; Stroke in an other family member. Allergies  Allergen Reactions  . Crestor [Rosuvastatin Calcium] Other (See Comments)    abd pain   Current Outpatient Medications on File Prior to Visit  Medication Sig Dispense Refill  . amLODipine (NORVASC) 10 MG tablet Take 1 tablet (10 mg total) by mouth daily. 90 tablet 3  . benazepril (LOTENSIN) 40 MG tablet Take 1 tablet by mouth once daily 90 tablet 0  . colestipol (COLESTID) 5 g granules Take 5 g by mouth 2 (two) times daily. 500 g 2  . doxazosin (CARDURA) 4 MG tablet TAKE 1 TABLET BY MOUTH AT BEDTIME 90 tablet 1  .  DULoxetine (CYMBALTA) 60 MG capsule Take 1 capsule (60 mg total) by mouth daily. 90 capsule 3  . ezetimibe (ZETIA) 10 MG tablet Take 1 tablet (10 mg total) by mouth daily. 90 tablet 3  . fluticasone (FLONASE) 50 MCG/ACT nasal spray Use 2 spray(s) in each nostril once daily 16 g 11  . hydrochlorothiazide (HYDRODIURIL) 25 MG tablet Take 0.5 tablets (12.5 mg total) by mouth daily. 45 tablet 3  . LORazepam (ATIVAN) 1 MG tablet Take 1 tablet by mouth twice daily as needed for anxiety 60 tablet 5  .  oxyCODONE-acetaminophen (PERCOCET/ROXICET) 5-325 MG tablet Take 1 tablet by mouth daily as needed for severe pain. 30 tablet 0  . potassium chloride (KLOR-CON 10) 10 MEQ tablet Take 1 tablet (10 mEq total) by mouth daily. 90 tablet 3  . pravastatin (PRAVACHOL) 80 MG tablet Take 1 tablet by mouth once daily 90 tablet 1  . rosuvastatin (CRESTOR) 20 MG tablet Take 1 tablet (20 mg total) by mouth daily. 90 tablet 3  . tamsulosin (FLOMAX) 0.4 MG CAPS capsule Take 1 capsule by mouth once daily 90 capsule 1  . testosterone (ANDROGEL) 50 MG/5GM (1%) GEL Place 5 g onto the skin daily. 450 g 1  . triamcinolone (NASACORT) 55 MCG/ACT AERO nasal inhaler Place 2 sprays into the nose daily. 1 Inhaler 12   No current facility-administered medications on file prior to visit.    Observations/Objective: Alert, NAD, appropriate mood and affect, resps normal, cn 2-12 intact, moves all 4s, no visible rash or swelling Lab Results  Component Value Date   WBC 6.3 01/18/2020   HGB 15.1 01/18/2020   HCT 45.0 01/18/2020   PLT 172.0 01/18/2020   GLUCOSE 100 (H) 07/18/2020   CHOL 154 07/18/2020   TRIG 124.0 07/18/2020   HDL 43.60 07/18/2020   LDLDIRECT 139.0 01/02/2016   LDLCALC 86 07/18/2020   ALT 45 07/18/2020   AST 29 07/18/2020   NA 140 07/18/2020   K 3.4 (L) 07/18/2020   CL 99 07/18/2020   CREATININE 1.45 07/18/2020   BUN 18 07/18/2020   CO2 30 07/18/2020   TSH 1.15 07/18/2020   PSA 1.10 01/18/2020   HGBA1C 5.4 07/18/2020   Assessment and Plan: See notes  Follow Up Instructions: See notes   I discussed the assessment and treatment plan with the patient. The patient was provided an opportunity to ask questions and all were answered. The patient agreed with the plan and demonstrated an understanding of the instructions.   The patient was advised to call back or seek an in-person evaluation if the symptoms worsen or if the condition fails to improve as anticipated.  Oliver Barre MD

## 2020-11-24 ENCOUNTER — Encounter: Payer: Self-pay | Admitting: Internal Medicine

## 2020-11-24 DIAGNOSIS — J329 Chronic sinusitis, unspecified: Secondary | ICD-10-CM | POA: Insufficient documentation

## 2020-11-24 NOTE — Patient Instructions (Signed)
Please take all new medication as prescribed 

## 2020-11-24 NOTE — Assessment & Plan Note (Signed)
Lab Results  Component Value Date   HGBA1C 5.4 07/18/2020   Stable, pt to continue current medical treatment  - diet and wt control

## 2020-11-24 NOTE — Assessment & Plan Note (Signed)
Mild to mod, for antibx course,  to f/u any worsening symptoms or concerns 

## 2020-11-24 NOTE — Assessment & Plan Note (Signed)
BP Readings from Last 3 Encounters:  08/07/20 122/78  07/20/20 (!) 130/100  01/18/20 (!) 160/98   Stable, pt to continue medical treatment novrasc, lotensin

## 2020-12-01 ENCOUNTER — Other Ambulatory Visit: Payer: Self-pay | Admitting: Internal Medicine

## 2020-12-11 ENCOUNTER — Telehealth: Payer: Self-pay | Admitting: Internal Medicine

## 2020-12-11 MED ORDER — OXYCODONE-ACETAMINOPHEN 5-325 MG PO TABS: 1.0000 | ORAL_TABLET | Freq: Every day | ORAL | 0 refills | Status: DC | PRN

## 2020-12-11 NOTE — Telephone Encounter (Signed)
PMP done; Last Filled 11/12/20; please advise

## 2020-12-11 NOTE — Telephone Encounter (Signed)
    1.Medication Requested: oxyCODONE-acetaminophen (PERCOCET/ROXICET) 5-325 MG tablet  2. Pharmacy (Name, Street, [place]):[facility] - [place], [place] - [address]  3. On Med List: yes   4. Last Visit with PCP: 07/20/20  5. Next visit date with PCP: 01/01/21   Agent: Please be advised that RX refills may take up to 3 business days. We ask that you follow-up with your pharmacy.

## 2020-12-11 NOTE — Telephone Encounter (Signed)
Ok to let pt know the percocet is refilled  I will no longer be able to do this after july1   Does he want a referral to pain management, b/c otherwise he will need to go without med

## 2020-12-11 NOTE — Telephone Encounter (Signed)
Spoke with patient's wife (okay per DPR) and advised that after July 1 Dr. Jonny Ruiz will no longer be prescribing this. Patient's wife states that they would like to discuss pain management referral at patient's visit 01/01/21. Routing to Dr. Jonny Ruiz for [facility] and final review.

## 2020-12-27 ENCOUNTER — Telehealth: Payer: Self-pay | Admitting: Internal Medicine

## 2020-12-27 NOTE — Telephone Encounter (Signed)
OK to contact pt  Please remind pt I will no longer be able to prescribe the pain medication after July 1  Does the patient want to be referred to pain management to continue this? We are asking because it takes up to 90 days sometimes to complete the referral process 

## 2020-12-28 NOTE — Telephone Encounter (Signed)
Left patient a voicemail to call office back.

## 2020-12-31 ENCOUNTER — Other Ambulatory Visit: Payer: Self-pay | Admitting: Internal Medicine

## 2020-12-31 ENCOUNTER — Other Ambulatory Visit: Payer: Self-pay

## 2020-12-31 NOTE — Telephone Encounter (Signed)
Please refill as per office routine med refill policy (all routine meds refilled for 3 mo or monthly per pt preference up to one year from last visit, then month to month grace period for 3 mo, then further med refills will have to be denied)  

## 2021-01-01 ENCOUNTER — Ambulatory Visit (INDEPENDENT_AMBULATORY_CARE_PROVIDER_SITE_OTHER): Payer: Medicare HMO | Admitting: Internal Medicine

## 2021-01-01 ENCOUNTER — Encounter: Payer: Self-pay | Admitting: Internal Medicine

## 2021-01-01 VITALS — BP 118/76 | HR 77 | Temp 98.2°F | Ht 67.0 in | Wt 275.4 lb

## 2021-01-01 DIAGNOSIS — N1831 Chronic kidney disease, stage 3a: Secondary | ICD-10-CM

## 2021-01-01 DIAGNOSIS — I700 Atherosclerosis of aorta: Secondary | ICD-10-CM | POA: Diagnosis not present

## 2021-01-01 DIAGNOSIS — Z1159 Encounter for screening for other viral diseases: Secondary | ICD-10-CM

## 2021-01-01 DIAGNOSIS — Z0000 Encounter for general adult medical examination without abnormal findings: Secondary | ICD-10-CM

## 2021-01-01 DIAGNOSIS — E538 Deficiency of other specified B group vitamins: Secondary | ICD-10-CM | POA: Diagnosis not present

## 2021-01-01 DIAGNOSIS — E785 Hyperlipidemia, unspecified: Secondary | ICD-10-CM

## 2021-01-01 DIAGNOSIS — E559 Vitamin D deficiency, unspecified: Secondary | ICD-10-CM | POA: Diagnosis not present

## 2021-01-01 DIAGNOSIS — I959 Hypotension, unspecified: Secondary | ICD-10-CM

## 2021-01-01 DIAGNOSIS — Z0001 Encounter for general adult medical examination with abnormal findings: Secondary | ICD-10-CM | POA: Diagnosis not present

## 2021-01-01 DIAGNOSIS — R739 Hyperglycemia, unspecified: Secondary | ICD-10-CM

## 2021-01-01 DIAGNOSIS — I10 Essential (primary) hypertension: Secondary | ICD-10-CM

## 2021-01-01 LAB — BASIC METABOLIC PANEL
BUN: 20 mg/dL (ref 6–23)
CO2: 32 mEq/L (ref 19–32)
Calcium: 9.2 mg/dL (ref 8.4–10.5)
Chloride: 101 mEq/L (ref 96–112)
Creatinine, Ser: 1.32 mg/dL (ref 0.40–1.50)
GFR: 61.44 mL/min (ref >60.00)
Glucose, Bld: 92 mg/dL (ref 70–99)
Potassium: 3.4 mEq/L — ABNORMAL LOW (ref 3.5–5.1)
Sodium: 139 mEq/L (ref 135–145)

## 2021-01-01 LAB — CBC WITH DIFFERENTIAL/PLATELET
Basophils Absolute: 0.1 10*3/uL (ref 0.0–0.1)
Basophils Relative: 0.9 % (ref 0.0–3.0)
Eosinophils Absolute: 0.2 10*3/uL (ref 0.0–0.7)
Eosinophils Relative: 2.6 % (ref 0.0–5.0)
HCT: 41 % (ref 39.0–52.0)
Hemoglobin: 13.9 g/dL (ref 13.0–17.0)
Lymphocytes Relative: 23.2 % (ref 12.0–46.0)
Lymphs Abs: 1.5 10*3/uL (ref 0.7–4.0)
MCHC: 33.9 g/dL (ref 30.0–36.0)
MCV: 80.4 fl (ref 78.0–100.0)
Monocytes Absolute: 0.5 10*3/uL (ref 0.1–1.0)
Monocytes Relative: 7.7 % (ref 3.0–12.0)
Neutro Abs: 4.2 10*3/uL (ref 1.4–7.7)
Neutrophils Relative %: 65.6 % (ref 43.0–77.0)
Platelets: 182 10*3/uL (ref 150.0–400.0)
RBC: 5.1 Mil/uL (ref 4.22–5.81)
RDW: 13.7 % (ref 11.5–15.5)
WBC: 6.5 10*3/uL (ref 4.0–10.5)

## 2021-01-01 LAB — HEMOGLOBIN A1C: Hgb A1c MFr Bld: 5.6 % (ref 4.6–6.5)

## 2021-01-01 LAB — LIPID PANEL
Cholesterol: 141 mg/dL (ref 0–200)
HDL: 50.5 mg/dL (ref >39.00)
LDL Cholesterol: 73 mg/dL (ref 0–99)
NonHDL: 90.69
Total CHOL/HDL Ratio: 3
Triglycerides: 89 mg/dL (ref 0.0–149.0)
VLDL: 17.8 mg/dL (ref 0.0–40.0)

## 2021-01-01 LAB — HEPATIC FUNCTION PANEL
ALT: 30 U/L (ref 0–53)
AST: 24 U/L (ref 0–37)
Albumin: 4.3 g/dL (ref 3.5–5.2)
Alkaline Phosphatase: 47 U/L (ref 39–117)
Bilirubin, Direct: 0.1 mg/dL (ref 0.0–0.3)
Total Bilirubin: 0.6 mg/dL (ref 0.2–1.2)
Total Protein: 6.6 g/dL (ref 6.0–8.3)

## 2021-01-01 LAB — VITAMIN B12: Vitamin B-12: 511 pg/mL (ref 211–911)

## 2021-01-01 LAB — TSH: TSH: 1.34 u[IU]/mL (ref 0.35–4.50)

## 2021-01-01 LAB — VITAMIN D 25 HYDROXY (VIT D DEFICIENCY, FRACTURES): VITD: 47.2 ng/mL (ref 30.00–100.00)

## 2021-01-01 LAB — PSA: PSA: 1.05 ng/mL (ref 0.10–4.00)

## 2021-01-01 NOTE — Patient Instructions (Signed)

## 2021-01-01 NOTE — Progress Notes (Signed)
Patient ID: Darren Black, male   DOB: 01/30/1966, 55 y.o.   MRN: [id number]         Chief Complaint:: wellness exam and Follow-up (6 month f/u)  and lower BP       HPI:  Darren Black is a 55 y.o. male here for wellness exam; will have covid booster later today; brother with recent prostate cancer; o/w up to date with preventive referrals and immunizations                        Also Pt denies chest pain, increased sob or doe, wheezing, orthopnea, PND, increased LE swelling, palpitations, dizziness or syncope.  Pt denies polydipsia, polyuria, or new focal neuro s/s.  Darren Black Pt denies fever, wt loss, night sweats, loss of appetite, or other constitutional symptoms  No other new compalints BP has been about 124/80 at home.     Wt Readings from Last 3 Encounters:  01/01/21 275 lb 6.4 oz (124.9 kg)  08/07/20 256 lb (116.1 kg)  07/20/20 253 lb (114.8 kg)   BP Readings from Last 3 Encounters:  01/01/21 118/76  08/07/20 122/78  07/20/20 (!) 130/100   Immunization History  Administered Date(s) Administered  . Influenza Split 08/28/2011  . Influenza,inj,Quad PF,6+ Mos 07/28/2017, 07/12/2018, 05/05/2019  . Influenza-Unspecified 07/20/2020  . PFIZER(Purple Top)SARS-COV-2 Vaccination 10/13/2019, 11/07/2019, 05/28/2020  . Tdap 01/21/2017   There are no preventive care reminders to display for this patient.    Past Medical History:  Diagnosis Date  . Anxiety   . BPH (benign prostatic hypertrophy)   . Chronic lower back pain    nerve damage w/ leg pain  . Depression   . Diarrhea 03/23/2019  . Family history of adverse reaction to anesthesia    MOTHER--- HARD TO WAKE  . History of panic attacks   . Hyperlipidemia   . Hypertension   . Meatal stenosis   . OSA on CPAP    moderate to severe OSA  per study 04-28-2007 uses CPAP  . PONV (postoperative nausea and vomiting)    Past Surgical History:  Procedure Laterality Date  . APPENDECTOMY  2003  . BREATH TEK H PYLORI  04/27/2012   Procedure:  BREATH TEK H PYLORI;  Surgeon: Valarie Merino, MD;  Location: [facility];  Service: General;  Laterality: N/A;  . CHOLECYSTECTOMY N/A 03/24/2019   Procedure: LAPAROSCOPIC CHOLECYSTECTOMY WITH INTRAOPERATIVE CHOLANGIOGRAM;  Surgeon: Manus Rudd, MD;  Location: [facility] OR;  Service: General;  Laterality: N/A;  . CYSTOSCOPY WITH URETHRAL DILATATION N/A 04/12/2015   Procedure: CYSTOSCOPY WITH URETHRAL DILATATION, RETROGRADE AND URETHROGRAM WITH BLADDER BIOPSY;  Surgeon: Bjorn Pippin, MD;  [facility];  Service: Urology;  Laterality: N/A;  . ROTATOR CUFF REPAIR Right 01-21-2012  . TRANSTHORACIC ECHOCARDIOGRAM  03-26-2007   normal LVSF, ef 60%,  mild AV calcification without stenosis,  mild MR and TR,  mild LAE  . URETHROGRAM N/A 04/12/2015   Procedure: URETHROGRAM;  Surgeon: Bjorn Pippin, MD;  Location: [facility];  Service: Urology;  Laterality: N/A;    reports that he has never smoked. He has never used smokeless tobacco. He reports current alcohol use. He reports that he does not use drugs. family history includes Cancer in his father; Coronary artery disease in his father; Diabetes in an other family member; Hyperlipidemia in an other family member; Hypertension in an other family member; Stroke in an other family member. Allergies  Allergen Reactions  . Crestor [  Rosuvastatin Calcium] Other (See Comments)    abd pain   Current Outpatient Medications on File Prior to Visit  Medication Sig Dispense Refill  . amLODipine (NORVASC) 10 MG tablet Take 1 tablet (10 mg total) by mouth daily. 90 tablet 3  . benazepril (LOTENSIN) 40 MG tablet Take 1 tablet by mouth once daily 90 tablet 0  . benzonatate (TESSALON) 200 MG capsule 1 tab by mouth three times daily as needed 40 capsule 1  . colestipol (COLESTID) 5 g granules Take 5 g by mouth 2 (two) times daily. 500 g 2  . doxazosin (CARDURA) 4 MG tablet TAKE 1 TABLET BY MOUTH AT BEDTIME 90 tablet 1  . doxycycline  (VIBRA-TABS) 100 MG tablet Take 1 tablet (100 mg total) by mouth 2 (two) times daily. 20 tablet 0  . DULoxetine (CYMBALTA) 60 MG capsule Take 1 capsule (60 mg total) by mouth daily. 90 capsule 3  . ezetimibe (ZETIA) 10 MG tablet Take 1 tablet (10 mg total) by mouth daily. 90 tablet 3  . fluticasone (FLONASE) 50 MCG/ACT nasal spray Use 2 spray(s) in each nostril once daily 16 g 11  . hydrochlorothiazide (HYDRODIURIL) 25 MG tablet Take 1/2 (one-half) tablet by mouth once daily 45 tablet 0  . LORazepam (ATIVAN) 1 MG tablet Take 1 tablet by mouth twice daily as needed for anxiety 60 tablet 5  . oxyCODONE-acetaminophen (PERCOCET/ROXICET) 5-325 MG tablet Take 1 tablet by mouth daily as needed for severe pain. 30 tablet 0  . potassium chloride (KLOR-CON) 10 MEQ tablet Take 1 tablet by mouth once daily 90 tablet 0  . pravastatin (PRAVACHOL) 80 MG tablet Take 1 tablet by mouth once daily 90 tablet 1  . rosuvastatin (CRESTOR) 20 MG tablet Take 1 tablet (20 mg total) by mouth daily. 90 tablet 3  . tamsulosin (FLOMAX) 0.4 MG CAPS capsule Take 1 capsule by mouth once daily 90 capsule 1  . testosterone (ANDROGEL) 50 MG/5GM (1%) GEL Place 5 g onto the skin daily. 450 g 1  . triamcinolone (NASACORT) 55 MCG/ACT AERO nasal inhaler Place 2 sprays into the nose daily. 1 Inhaler 12   No current facility-administered medications on file prior to visit.        ROS:  All others reviewed and negative.  Objective        PE:  BP 118/76 (BP Location: Right Arm, Patient Position: Sitting, Cuff Size: Large)   Pulse 77   Temp 98.2 F (36.8 C) (Oral)   Ht 5\' 7"  (1.702 m)   Wt 275 lb 6.4 oz (124.9 kg)   SpO2 96%   BMI 43.13 kg/m                 Constitutional: Pt appears in NAD               HENT: Head: NCAT.                Right Ear: External ear normal.                 Left Ear: External ear normal.                Eyes: . Pupils are equal, round, and reactive to light. Conjunctivae and EOM are normal                Nose: without d/c or deformity               Neck: Neck supple. Gross normal ROM  Cardiovascular: Normal rate and regular rhythm.                 Pulmonary/Chest: Effort normal and breath sounds without rales or wheezing.                Abd:  Soft, NT, ND, + BS, no organomegaly               Neurological: Pt is alert. At baseline orientation, motor grossly intact               Skin: Skin is warm. No rashes, no other new lesions, LE edema - *none               Psychiatric: Pt behavior is normal without agitation   Micro: none  Cardiac tracings I have personally interpreted today:  none  Pertinent Radiological findings (summarize): none   Lab Results  Component Value Date   WBC 6.5 01/01/2021   HGB 13.9 01/01/2021   HCT 41.0 01/01/2021   PLT 182.0 01/01/2021   GLUCOSE 92 01/01/2021   CHOL 141 01/01/2021   TRIG 89.0 01/01/2021   HDL 50.50 01/01/2021   LDLDIRECT 139.0 01/02/2016   LDLCALC 73 01/01/2021   ALT 30 01/01/2021   AST 24 01/01/2021   NA 139 01/01/2021   K 3.4 (L) 01/01/2021   CL 101 01/01/2021   CREATININE 1.32 01/01/2021   BUN 20 01/01/2021   CO2 32 01/01/2021   TSH 1.34 01/01/2021   PSA 1.05 01/01/2021   HGBA1C 5.6 01/01/2021   Assessment/Plan:  Koron Godeaux is a 55 y.o. White or Caucasian [1] male with  has a past medical history of Anxiety, BPH (benign prostatic hypertrophy), Chronic lower back pain, Depression, Diarrhea (03/23/2019), Family history of adverse reaction to anesthesia, History of panic attacks, Hyperlipidemia, Hypertension, Meatal stenosis, OSA on CPAP, and PONV (postoperative nausea and vomiting).  Encounter for well adult exam with abnormal findings Age and sex appropriate education and counseling updated with regular exercise and diet Referrals for preventative services - none needed Immunizations addressed - none needed Smoking counseling  - none needed Evidence for depression or other mood disorder - none significant Most  recent labs reviewed. I have personally reviewed and have noted: 1) the patient's medical and social history 2) The patient's current medications and supplements 3) The patient's height, weight, and BMI have been recorded in the chart   Hypotension Low normal, asympt, to continue current med tx - lotension, norvasc   CKD (chronic kidney disease) stage 3, GFR 30-59 ml/min (HCC) Lab Results  Component Value Date   CREATININE 1.32 01/01/2021   Stable overall, cont to avoid nephrotoxins   Hyperglycemia Lab Results  Component Value Date   HGBA1C 5.6 01/01/2021   Stable, pt to continue current medical treatment  - diet  Hyperlipidemia Lab Results  Component Value Date   LDLCALC 73 01/01/2021   Stable, pt to continue current statin pravachol 80   Aortic atherosclerosis (HCC) For pravachol 80 qd, low chol diet,  to f/u any worsening symptoms or concerns  Followup: Return in about 6 months (around 07/04/2021).  Oliver Barre, MD 01/08/2021 8:58 PM  Medical Group  Primary Care - [facility]

## 2021-01-02 ENCOUNTER — Encounter: Payer: Self-pay | Admitting: Internal Medicine

## 2021-01-02 DIAGNOSIS — Z0100 Encounter for examination of eyes and vision without abnormal findings: Secondary | ICD-10-CM | POA: Diagnosis not present

## 2021-01-02 LAB — URINALYSIS, ROUTINE W REFLEX MICROSCOPIC
Bilirubin Urine: NEGATIVE
Hgb urine dipstick: NEGATIVE
Ketones, ur: NEGATIVE
Leukocytes,Ua: NEGATIVE
Nitrite: NEGATIVE
RBC / HPF: NONE SEEN (ref 0–?)
Specific Gravity, Urine: 1.01 (ref 1.000–1.030)
Total Protein, Urine: NEGATIVE
Urine Glucose: NEGATIVE
Urobilinogen, UA: 0.2 (ref 0.0–1.0)
WBC, UA: NONE SEEN (ref 0–?)
pH: 6 (ref 5.0–8.0)

## 2021-01-02 LAB — HEPATITIS C ANTIBODY
Hepatitis C Ab: NONREACTIVE
SIGNAL TO CUT-OFF: 0 (ref <1.00)

## 2021-01-04 ENCOUNTER — Other Ambulatory Visit: Payer: Self-pay | Admitting: Internal Medicine

## 2021-01-04 ENCOUNTER — Ambulatory Visit: Payer: Medicare HMO | Admitting: Internal Medicine

## 2021-01-04 DIAGNOSIS — G8929 Other chronic pain: Secondary | ICD-10-CM

## 2021-01-08 ENCOUNTER — Encounter: Payer: Self-pay | Admitting: Internal Medicine

## 2021-01-08 NOTE — Assessment & Plan Note (Signed)

## 2021-01-08 NOTE — Assessment & Plan Note (Signed)
Low normal, asympt, to continue current med tx - lotension, norvasc

## 2021-01-08 NOTE — Assessment & Plan Note (Signed)
For pravachol 80 qd, low chol diet,  to f/u any worsening symptoms or concerns

## 2021-01-08 NOTE — Assessment & Plan Note (Signed)
Lab Results  Component Value Date   HGBA1C 5.6 01/01/2021   Stable, pt to continue current medical treatment  - diet

## 2021-01-08 NOTE — Assessment & Plan Note (Signed)
Lab Results  Component Value Date   CREATININE 1.32 01/01/2021   Stable overall, cont to avoid nephrotoxins

## 2021-01-08 NOTE — Assessment & Plan Note (Signed)
Lab Results  Component Value Date   LDLCALC 73 01/01/2021   Stable, pt to continue current statin pravachol 80

## 2021-01-10 ENCOUNTER — Telehealth: Payer: Self-pay | Admitting: Internal Medicine

## 2021-01-10 DIAGNOSIS — Z20822 Contact with and (suspected) exposure to covid-19: Secondary | ICD-10-CM | POA: Diagnosis not present

## 2021-01-10 MED ORDER — OXYCODONE-ACETAMINOPHEN 5-325 MG PO TABS: 1.0000 | ORAL_TABLET | Freq: Every day | ORAL | 0 refills | Status: DC | PRN

## 2021-01-10 NOTE — Telephone Encounter (Signed)
1.Medication Requested:oxyCODONE-acetaminophen (PERCOCET/ROXICET) 5-325 MG tablet  2. Pharmacy (Name, Street, [place]):[facility] - [place], [place] - [address]  3. On Med List: yes  4. Last Visit with PCP: 01/01/21  5. Next visit date with PCP: 07/08/21   Agent: Please be advised that RX refills may take up to 3 business days. We ask that you follow-up with your pharmacy.

## 2021-01-10 NOTE — Addendum Note (Signed)
Addended by: Corwin Levins on: 01/10/2021 12:39 PM   Modules accepted: Orders

## 2021-01-21 ENCOUNTER — Other Ambulatory Visit: Payer: Self-pay | Admitting: Internal Medicine

## 2021-01-24 ENCOUNTER — Encounter: Payer: Self-pay | Admitting: Physical Medicine and Rehabilitation

## 2021-02-12 ENCOUNTER — Other Ambulatory Visit: Payer: Self-pay | Admitting: Internal Medicine

## 2021-02-12 ENCOUNTER — Telehealth: Payer: Self-pay | Admitting: Internal Medicine

## 2021-02-12 MED ORDER — OXYCODONE-ACETAMINOPHEN 5-325 MG PO TABS: 1.0000 | ORAL_TABLET | Freq: Every day | ORAL | 0 refills | Status: DC | PRN

## 2021-02-12 NOTE — Telephone Encounter (Signed)
1.Medication Requested: oxyCODONE-acetaminophen (PERCOCET/ROXICET) 5-325 MG tablet    2. Pharmacy (Name, Street, [place]): [facility] - [place], [place] - [address]  3. On Med List: yes   4. Last Visit with PCP: 01-01-21  5. Next visit date with PCP: 07-08-21   Agent: Please be advised that RX refills may take up to 3 business days. We ask that you follow-up with your pharmacy.  

## 2021-03-08 ENCOUNTER — Other Ambulatory Visit: Payer: Self-pay | Admitting: Internal Medicine

## 2021-03-22 ENCOUNTER — Other Ambulatory Visit: Payer: Self-pay

## 2021-03-22 ENCOUNTER — Encounter: Payer: Self-pay | Admitting: Physical Medicine and Rehabilitation

## 2021-03-22 ENCOUNTER — Encounter: Payer: Medicare HMO | Attending: Physical Medicine and Rehabilitation | Admitting: Physical Medicine and Rehabilitation

## 2021-03-22 VITALS — BP 109/78 | HR 99 | Temp 98.8°F | Ht 67.0 in | Wt 283.4 lb

## 2021-03-22 DIAGNOSIS — G8929 Other chronic pain: Secondary | ICD-10-CM | POA: Diagnosis not present

## 2021-03-22 DIAGNOSIS — M5442 Lumbago with sciatica, left side: Secondary | ICD-10-CM | POA: Diagnosis not present

## 2021-03-22 DIAGNOSIS — G894 Chronic pain syndrome: Secondary | ICD-10-CM | POA: Insufficient documentation

## 2021-03-22 DIAGNOSIS — M542 Cervicalgia: Secondary | ICD-10-CM | POA: Diagnosis not present

## 2021-03-22 DIAGNOSIS — Z79891 Long term (current) use of opiate analgesic: Secondary | ICD-10-CM | POA: Diagnosis not present

## 2021-03-22 DIAGNOSIS — M5416 Radiculopathy, lumbar region: Secondary | ICD-10-CM | POA: Diagnosis not present

## 2021-03-22 DIAGNOSIS — Z5181 Encounter for therapeutic drug level monitoring: Secondary | ICD-10-CM | POA: Diagnosis not present

## 2021-03-22 DIAGNOSIS — M4800 Spinal stenosis, site unspecified: Secondary | ICD-10-CM | POA: Insufficient documentation

## 2021-03-22 DIAGNOSIS — M5441 Lumbago with sciatica, right side: Secondary | ICD-10-CM | POA: Insufficient documentation

## 2021-03-22 MED ORDER — GABAPENTIN 300 MG PO CAPS: 300.0000 mg | ORAL_CAPSULE | Freq: Two times a day (BID) | ORAL | 5 refills | Status: DC

## 2021-03-22 MED ORDER — CYCLOBENZAPRINE HCL 10 MG PO TABS
10.0000 mg | ORAL_TABLET | Freq: Every evening | ORAL | 5 refills | Status: DC | PRN
Start: 2021-03-22 — End: 2021-10-30

## 2021-03-22 NOTE — Progress Notes (Signed)
Subjective:    Patient ID: Darren Black, male    DOB: 1966-02-27, 55 y.o.   MRN: [id number]  HPI  Pt is a 55 yr old  R handed male with hx of R shoulder pain due to complete supraspinatus tear HTN; no DM; HLD, BPH, on Flomax and Prazosin; and allergies and low testosterone, depression/anxiety on Lorazepam/Cymbalta; ;   Here for evaluation of chronic low back pain.    His PCP has decided to stop prescribing pain meds anymore.    Imaging. Last done in 2003- [place]- did MRI and nerve EMG-   Has pain in low back and upper back-  And now having some "crunching in neck" for the past year.  Just having pain in his neck- but no "weakness" in UE B/L- 'ignores it".   Has radiculopathy and "nerve damage" numbness on R knee down to toes- cannot feel R foot when walking.  Also starting to have numbness on inner thigh B/L  but R>L- started 3-4 months ago- on and off.   Described pain as electrical and stabbing - like a malfunction. Basically constant; always has some- but if leans to one side or another, has some relief for short period. Tried to hang onto things -leans forward- when trying to relax back- lumbar extension makes it worse.   Has gone to Chiropractor- in past- did a lot of traction.    Hurt back 1 month ago- was stuck in bed for 2 weeks.  Doesn't like ot go to doctor- "end of rope"    Lose balance a lot, but hasn't had fallen lately.    Not on any nerve pain meds.  Didn't know they existed.    Tried: Used to get some "shots"- 30 shots- not helpful Doesn't think has had epidural steroid injections; but sounds like did "a long needle" back in 2003- didn't help.   Current meds aren't a great help- but takes the edge off. Always there, no matter what.  Has taken Aleve- doesn't help.  Tried a new medicine- "synthetic pain medicine"- made him sick/nauseated- Tramadol- made him really ill.  Never tried gabapentin On Duloxetine- 60 mg daily- has helped  depression- doesn't know if helped pain or not.  Never tried Lyrica.  Never tried Lidocaine patches  PMHx: Surgical- gallbladder removed; appendix out and Surgery on R shoulder- in 2013- reattached RTC tear.   Pain Inventory Average Pain 8 Pain Right Now 8 My pain is constant, sharp, stabbing, and tingling  In the last 24 hours, has pain interfered with the following? General activity 10 Relation with others 10 Enjoyment of life 10 What TIME of day is your pain at its worst? daytime, evening, and night Sleep (in general) Poor  Pain is worse with: walking, bending, sitting, inactivity, standing, and some activites Pain improves with: medication Relief from Meds: 3  walk without assistance ability to climb steps?  yes  not employed: date last employed 2003 disabled: date disabled 01/2005  weakness numbness tingling trouble walking spasms dizziness depression anxiety  x-rays CT/MRI nerve study  Primary care Dr. Oliver Barre    Family History  Problem Relation Age of Onset   Coronary artery disease Father    Cancer Father        anaplastic thyroid cancer   Stroke Other        1st degree relative   Diabetes Other        1st degree relative   Hypertension Other  Hyperlipidemia Other    Social History   Socioeconomic History   Marital status: Married    Spouse name: Not on file   Number of children: Not on file   Years of education: Not on file   Highest education level: Not on file  Occupational History   Occupation: disabled, LBP and anxiety  Tobacco Use   Smoking status: Never   Smokeless tobacco: Never  Vaping Use   Vaping Use: Never used  Substance and Sexual Activity   Alcohol use: Yes    Comment: rare   Drug use: No   Sexual activity: Not on file  Other Topics Concern   Not on file  Social History Narrative   Not on file   Social Determinants of Health   Financial Resource Strain: Low Risk    Difficulty of Paying Living Expenses:  Not hard at all  Food Insecurity: No Food Insecurity   Worried About Programme researcher, broadcasting/film/video in the Last Year: Never true   Ran Out of Food in the Last Year: Never true  Transportation Needs: No Transportation Needs   Lack of Transportation (Medical): No   Lack of Transportation (Non-Medical): No  Physical Activity: Sufficiently Active   Days of Exercise per Week: 5 days   Minutes of Exercise per Session: 30 min  Stress: No Stress Concern Present   Feeling of Stress : Not at all  Social Connections: Moderately Isolated   Frequency of Communication with Friends and Family: More than three times a week   Frequency of Social Gatherings with Friends and Family: Once a week   Attends Religious Services: Never   Database administrator or Organizations: No   Attends Engineer, structural: Never   Marital Status: Married   Past Surgical History:  Procedure Laterality Date   APPENDECTOMY  2003   BREATH Nelda Bucks  04/27/2012   Procedure: BREATH TEK H PYLORI;  Surgeon: Valarie Merino, MD;  Location: [facility];  Service: General;  Laterality: N/A;   CHOLECYSTECTOMY N/A 03/24/2019   Procedure: LAPAROSCOPIC CHOLECYSTECTOMY WITH INTRAOPERATIVE CHOLANGIOGRAM;  Surgeon: Manus Rudd, MD;  [facility];  Service: General;  Laterality: N/A;   CYSTOSCOPY WITH URETHRAL DILATATION N/A 04/12/2015   Procedure: CYSTOSCOPY WITH URETHRAL DILATATION, RETROGRADE AND URETHROGRAM WITH BLADDER BIOPSY;  Surgeon: Bjorn Pippin, MD;  Location: [facility];  Service: Urology;  Laterality: N/A;   ROTATOR CUFF REPAIR Right 01-21-2012   TRANSTHORACIC ECHOCARDIOGRAM  03-26-2007   normal LVSF, ef 60%,  mild AV calcification without stenosis,  mild MR and TR,  mild LAE   URETHROGRAM N/A 04/12/2015   Procedure: URETHROGRAM;  Surgeon: Bjorn Pippin, MD;  Location: [facility];  Service: Urology;  Laterality: N/A;   Past Medical History:  Diagnosis Date   Anxiety    BPH (benign  prostatic hypertrophy)    Chronic lower back pain    nerve damage w/ leg pain   Depression    Diarrhea 03/23/2019   Family history of adverse reaction to anesthesia    MOTHER--- HARD TO WAKE   History of panic attacks    Hyperlipidemia    Hypertension    Meatal stenosis    OSA on CPAP    moderate to severe OSA  per study 04-28-2007 uses CPAP   PONV (postoperative nausea and vomiting)    BP 109/78 (BP Location: Right Arm, Patient Position: Sitting, Cuff Size: Large)   Pulse 99   Temp 98.8 F (37.1  C) (Oral)   Ht 5\' 7"  (1.702 m)   Wt 283 lb 6.4 oz (128.5 kg)   SpO2 94%   BMI 44.39 kg/m   Opioid Risk Score:   Fall Risk Score:  `1  Depression screen PHQ 2/9  Depression screen [facility] 2/9 03/22/2021 01/01/2021 01/01/2021 08/07/2020 01/18/2020 01/18/2020 01/11/2019  Decreased Interest 3 0 0 0 0 0 0  Down, Depressed, Hopeless 3 0 0 1 0 0 0  PHQ - 2 Score 6 0 0 1 0 0 0  Altered sleeping 3 - - - - - -  Tired, decreased energy 3 - - - - - -  Change in appetite 3 - - - - - -  Feeling bad or failure about yourself  3 - - - - - -  Trouble concentrating 3 - - - - - -  Moving slowly or fidgety/restless 2 - - - - - -  Suicidal thoughts 0 - - - - - -  PHQ-9 Score 23 - - - - - -  Difficult doing work/chores Somewhat difficult - - - - - -     Review of Systems  Musculoskeletal:  Positive for back pain, gait problem and neck pain.       Leg pain spasms  Neurological:  Positive for dizziness, weakness and numbness.  Psychiatric/Behavioral:  Positive for dysphoric mood. The patient is nervous/anxious.   All other systems reviewed and are negative.     Objective:   Physical Exam Awake, alert, appropriate, accompanied by wife, no assistive device, NAD LUE: biceps 5-/5, triceps 5-/5, WE 4/5, grip 4+/5, and finger abd 5-/5 RUE; Biceps, triceps 5-/5, WE 4+/5, grip and finger abd 5-/5 LE's:  RLE- HF 2/5 (due to pain- cannot lift); KE 4+/5, KF 4+/5, DF 4+/5 and PF 4+/5 LLE: HF 2/5-(due to pain);   KE/KF 4+/5, DF 5-/5 and PF 5-/5  Neuro: Decreased to light touch -vastly decreased L3 to S1 on R; and mildly decreased to LT on L in same area DTRs patellae 2+ B/L   Skin: abrasion on L knee- mild  SLR (+) B/L-  TTP over low back - in band across lumbar spine; also has trigger points in thoracic paraspinals B/L- also TrPs in cervical spine and upper traps, levators and scapulae.      Assessment & Plan:   Pt is a 55 yr old male with hx of R shoulder pain due to complete supraspinatus tear HTN; no DM; HLD, BPH, on Flomax and Prazosin; and allergies and low testosterone, depression/anxiety on Lorazepam/Cymbalta; ;   Here for evaluation of chronic low back pain.   Lidocaine patches- 3 patches at 1 time- on 12 hrs on; 12 hrs off.  Muscle relaxants- Flexeril/Cyclobenzaprine- 10 mg nightly for 7 days- then back off to 4-5 evening/week. To keep medicine working.  Needs to do body work- 3-4x/week- muscle massage or compression.  Body work- 58- 1 in house; 1 in car- against hard, flat surface- hold pressure- no massage for 2-4 minutes on each spot- max 30 minutes/day- most of my patient sdo it 3-4x/week.  Please bring in old MRIs/CT scan/EMGs to me to read.  Need opiate dry screen- and opiate contract-  Gabapentin- 300 mg 2x/day x 1 week, then 600 mg 2x/day- for nerve pain- can have dizziness/cognitive slowing- and swelling- if mild- will see if improves, if worse, STOP IT! If UDS comes back appropriate- then can take over Percocet 5/325 2x/day. F/U in 2.5 to 3 months- call me  if any concerns.   I spent a total of 48 minutes on visit- as detailed above.

## 2021-03-22 NOTE — Patient Instructions (Addendum)
Pt is a 55 yr old male with hx of R shoulder pain due to complete supraspinatus tear HTN; no DM; HLD, BPH, on Flomax and Prazosin; and allergies and low testosterone, depression/anxiety on Lorazepam/Cymbalta; ;   Here for evaluation of chronic low back pain.   Lidocaine patches- 3 patches at 1 time- on 12 hrs on; 12 hrs off. Use as needed Muscle relaxants- Flexeril/Cyclobenzaprine- 10 mg nightly for 7 days- then back off to 4-5 evening/week. To keep medicine working.  Needs to do body work- 3-4x/week- muscle massage or compression.  Body work- Education officer, environmental- 1 in house; 1 in car- against hard, flat surface- hold pressure- no massage for 2-4 minutes on each spot- max 30 minutes/day- most of my patient sdo it 3-4x/week.  Please bring in old MRIs/CT scan/EMGs to me to read.  Need opiate dry screen- and opiate contract-  Gabapentin- 300 mg 2x/day x 1 week, then 600 mg 2x/day- for nerve pain- can have dizziness/cognitive slowing- and swelling- if mild- will see if improves, if worse, STOP IT! If UDS comes back appropriate- then can take over Percocet 5/325 2x/day. F/U in 2.5 to 3 months- call me if any concerns.  Add one medicine at a time.  Start with muscle relaxant- Flexeril- wait 2 days, then start Gabapentin.

## 2021-03-26 LAB — TOXASSURE SELECT,+ANTIDEPR,UR

## 2021-03-28 ENCOUNTER — Telehealth: Payer: Self-pay | Admitting: *Deleted

## 2021-03-28 NOTE — Telephone Encounter (Signed)
Urine drug screen for this encounter is consistent for prescribed medication 

## 2021-04-01 ENCOUNTER — Telehealth: Payer: Self-pay

## 2021-04-01 ENCOUNTER — Other Ambulatory Visit: Payer: Self-pay | Admitting: Internal Medicine

## 2021-04-01 MED ORDER — OXYCODONE-ACETAMINOPHEN 5-325 MG PO TABS: 1.0000 | ORAL_TABLET | Freq: Every day | ORAL | 0 refills | Status: DC | PRN

## 2021-04-01 NOTE — Telephone Encounter (Signed)
Please refill as per office routine med refill policy (all routine meds refilled for 3 mo or monthly per pt preference up to one year from last visit, then month to month grace period for 3 mo, then further med refills will have to be denied)  

## 2021-04-01 NOTE — Telephone Encounter (Signed)
Pt wife called to obtain lab results and to also know when can Darren Black start pain medicatiin

## 2021-04-02 ENCOUNTER — Telehealth: Payer: Self-pay | Admitting: *Deleted

## 2021-04-02 NOTE — Chronic Care Management (AMB) (Signed)
  Chronic Care Management   Note  04/02/2021 Name: Darren Black MRN: [id number] DOB: [birth year]  Darren Black is a 55 y.o. year old male who is a primary care patient of Levante, Hunt Oris, MD. I reached out to Darren Black by phone today in response to a referral sent by Darren Black's PCP, Dr. Jenny Reichmann      Darren Black was given information about Chronic Care Management services today including:  CCM service includes personalized support from designated clinical staff supervised by his physician, including individualized plan of care and coordination with other care providers 24/7 contact phone numbers for assistance for urgent and routine care needs. Service will only be billed when office clinical staff spend 20 minutes or more in a month to coordinate care. Only one practitioner may furnish and bill the service in a calendar month. The patient may stop CCM services at any time (effective at the end of the month) by phone call to the office staff. The patient will be responsible for cost sharing (co-pay) of up to 20% of the service fee (after annual deductible is met).  Patient did not agree to enrollment in care management services and does not wish to consider at this time.  Follow up plan: Patient declines engagement by the care management team. Appropriate care team members and provider have been notified via electronic communication. The care management team is available to follow up with the patient after provider conversation with the patient regarding recommendation for care management engagement and subsequent re-referral to the care management team.   [place] [facility]  Direct Dial: [phone]

## 2021-04-02 NOTE — Telephone Encounter (Signed)
Notified Dr  Berline Chough sent medication to the pharmacy.

## 2021-04-15 ENCOUNTER — Other Ambulatory Visit: Payer: Self-pay | Admitting: Internal Medicine

## 2021-04-15 NOTE — Telephone Encounter (Signed)
Please refill as per office routine med refill policy (all routine meds refilled for 3 mo or monthly per pt preference up to one year from last visit, then month to month grace period for 3 mo, then further med refills will have to be denied)  

## 2021-06-06 ENCOUNTER — Other Ambulatory Visit: Payer: Self-pay | Admitting: Internal Medicine

## 2021-06-06 NOTE — Telephone Encounter (Signed)
Please refill as per office routine med refill policy (all routine meds to be refilled for 3 mo or monthly (per pt preference) up to one year from last visit, then month to month grace period for 3 mo, then further med refills will have to be denied) ? ?

## 2021-06-21 ENCOUNTER — Encounter: Payer: Medicare HMO | Admitting: Physical Medicine and Rehabilitation

## 2021-06-29 ENCOUNTER — Other Ambulatory Visit: Payer: Self-pay | Admitting: Internal Medicine

## 2021-06-29 NOTE — Telephone Encounter (Signed)
Please refill as per office routine med refill policy (all routine meds to be refilled for 3 mo or monthly (per pt preference) up to one year from last visit, then month to month grace period for 3 mo, then further med refills will have to be denied) ? ?

## 2021-07-02 ENCOUNTER — Telehealth: Payer: Self-pay | Admitting: Internal Medicine

## 2021-07-02 NOTE — Telephone Encounter (Signed)
Spouse requesting call back to discuss order for labs prior to appt on 07-08-2021

## 2021-07-02 NOTE — Telephone Encounter (Signed)
Left message for patient or patient's wife to call me back

## 2021-07-02 NOTE — Telephone Encounter (Signed)
Patient would like labs ordered prior to visit on 07/08/21

## 2021-07-03 NOTE — Telephone Encounter (Signed)
Already ordered and ready to go - see order review tab 

## 2021-07-03 NOTE — Telephone Encounter (Signed)
Patient notified via voicemail.

## 2021-07-05 ENCOUNTER — Other Ambulatory Visit (INDEPENDENT_AMBULATORY_CARE_PROVIDER_SITE_OTHER): Payer: Medicare HMO

## 2021-07-05 ENCOUNTER — Other Ambulatory Visit: Payer: Self-pay

## 2021-07-05 DIAGNOSIS — E291 Testicular hypofunction: Secondary | ICD-10-CM | POA: Diagnosis not present

## 2021-07-05 DIAGNOSIS — R739 Hyperglycemia, unspecified: Secondary | ICD-10-CM

## 2021-07-05 DIAGNOSIS — N183 Chronic kidney disease, stage 3 unspecified: Secondary | ICD-10-CM | POA: Diagnosis not present

## 2021-07-05 DIAGNOSIS — E785 Hyperlipidemia, unspecified: Secondary | ICD-10-CM | POA: Diagnosis not present

## 2021-07-05 DIAGNOSIS — Z0000 Encounter for general adult medical examination without abnormal findings: Secondary | ICD-10-CM

## 2021-07-05 LAB — BASIC METABOLIC PANEL
BUN: 15 mg/dL (ref 6–23)
CO2: 33 mEq/L — ABNORMAL HIGH (ref 19–32)
Calcium: 9.5 mg/dL (ref 8.4–10.5)
Chloride: 101 mEq/L (ref 96–112)
Creatinine, Ser: 1.72 mg/dL — ABNORMAL HIGH (ref 0.40–1.50)
GFR: 44.56 mL/min — ABNORMAL LOW (ref >60.00)
Glucose, Bld: 105 mg/dL — ABNORMAL HIGH (ref 70–99)
Potassium: 4 mEq/L (ref 3.5–5.1)
Sodium: 139 mEq/L (ref 135–145)

## 2021-07-05 LAB — URINALYSIS, ROUTINE W REFLEX MICROSCOPIC
Bilirubin Urine: NEGATIVE
Hgb urine dipstick: NEGATIVE
Ketones, ur: NEGATIVE
Leukocytes,Ua: NEGATIVE
Nitrite: NEGATIVE
Specific Gravity, Urine: 1.01 (ref 1.000–1.030)
Total Protein, Urine: NEGATIVE
Urine Glucose: NEGATIVE
Urobilinogen, UA: 0.2 (ref 0.0–1.0)
pH: 6 (ref 5.0–8.0)

## 2021-07-05 LAB — LIPID PANEL
Cholesterol: 155 mg/dL (ref 0–200)
HDL: 45.7 mg/dL (ref >39.00)
LDL Cholesterol: 89 mg/dL (ref 0–99)
NonHDL: 109.47
Total CHOL/HDL Ratio: 3
Triglycerides: 101 mg/dL (ref 0.0–149.0)
VLDL: 20.2 mg/dL (ref 0.0–40.0)

## 2021-07-05 LAB — CBC WITH DIFFERENTIAL/PLATELET
Basophils Absolute: 0.1 10*3/uL (ref 0.0–0.1)
Basophils Relative: 0.9 % (ref 0.0–3.0)
Eosinophils Absolute: 0.2 10*3/uL (ref 0.0–0.7)
Eosinophils Relative: 2.8 % (ref 0.0–5.0)
HCT: 43.2 % (ref 39.0–52.0)
Hemoglobin: 14.3 g/dL (ref 13.0–17.0)
Lymphocytes Relative: 23.8 % (ref 12.0–46.0)
Lymphs Abs: 1.6 10*3/uL (ref 0.7–4.0)
MCHC: 33.1 g/dL (ref 30.0–36.0)
MCV: 81.2 fl (ref 78.0–100.0)
Monocytes Absolute: 0.5 10*3/uL (ref 0.1–1.0)
Monocytes Relative: 7.8 % (ref 3.0–12.0)
Neutro Abs: 4.2 10*3/uL (ref 1.4–7.7)
Neutrophils Relative %: 64.7 % (ref 43.0–77.0)
Platelets: 189 10*3/uL (ref 150.0–400.0)
RBC: 5.32 Mil/uL (ref 4.22–5.81)
RDW: 13.9 % (ref 11.5–15.5)
WBC: 6.6 10*3/uL (ref 4.0–10.5)

## 2021-07-05 LAB — HEPATIC FUNCTION PANEL
ALT: 27 U/L (ref 0–53)
AST: 27 U/L (ref 0–37)
Albumin: 4.4 g/dL (ref 3.5–5.2)
Alkaline Phosphatase: 47 U/L (ref 39–117)
Bilirubin, Direct: 0.1 mg/dL (ref 0.0–0.3)
Total Bilirubin: 0.7 mg/dL (ref 0.2–1.2)
Total Protein: 6.9 g/dL (ref 6.0–8.3)

## 2021-07-05 LAB — MICROALBUMIN / CREATININE URINE RATIO
Creatinine,U: 73.5 mg/dL
Microalb Creat Ratio: 1.6 mg/g (ref 0.0–30.0)
Microalb, Ur: 1.2 mg/dL (ref 0.0–1.9)

## 2021-07-05 LAB — TESTOSTERONE: Testosterone: 225.63 ng/dL — ABNORMAL LOW (ref 300.00–890.00)

## 2021-07-05 LAB — HEMOGLOBIN A1C: Hgb A1c MFr Bld: 5.8 % (ref 4.6–6.5)

## 2021-07-05 LAB — VITAMIN D 25 HYDROXY (VIT D DEFICIENCY, FRACTURES): VITD: 57.65 ng/mL (ref 30.00–100.00)

## 2021-07-05 LAB — TSH: TSH: 1.24 u[IU]/mL (ref 0.35–5.50)

## 2021-07-05 LAB — PSA: PSA: 1.24 ng/mL (ref 0.10–4.00)

## 2021-07-07 ENCOUNTER — Other Ambulatory Visit: Payer: Self-pay | Admitting: Internal Medicine

## 2021-07-07 NOTE — Telephone Encounter (Signed)
Please refill as per office routine med refill policy (all routine meds to be refilled for 3 mo or monthly (per pt preference) up to one year from last visit, then month to month grace period for 3 mo, then further med refills will have to be denied) ? ?

## 2021-07-08 ENCOUNTER — Ambulatory Visit (INDEPENDENT_AMBULATORY_CARE_PROVIDER_SITE_OTHER): Payer: Medicare HMO | Admitting: Internal Medicine

## 2021-07-08 ENCOUNTER — Other Ambulatory Visit: Payer: Self-pay

## 2021-07-08 VITALS — BP 120/80 | HR 70 | Temp 98.6°F | Ht 67.0 in | Wt 286.0 lb

## 2021-07-08 DIAGNOSIS — R739 Hyperglycemia, unspecified: Secondary | ICD-10-CM

## 2021-07-08 DIAGNOSIS — L821 Other seborrheic keratosis: Secondary | ICD-10-CM | POA: Insufficient documentation

## 2021-07-08 DIAGNOSIS — L409 Psoriasis, unspecified: Secondary | ICD-10-CM | POA: Insufficient documentation

## 2021-07-08 DIAGNOSIS — E7800 Pure hypercholesterolemia, unspecified: Secondary | ICD-10-CM | POA: Diagnosis not present

## 2021-07-08 DIAGNOSIS — I10 Essential (primary) hypertension: Secondary | ICD-10-CM

## 2021-07-08 DIAGNOSIS — Z1211 Encounter for screening for malignant neoplasm of colon: Secondary | ICD-10-CM

## 2021-07-08 DIAGNOSIS — N179 Acute kidney failure, unspecified: Secondary | ICD-10-CM

## 2021-07-08 NOTE — Assessment & Plan Note (Signed)
BP Readings from Last 3 Encounters:  07/08/21 120/80  03/22/21 109/78  01/01/21 118/76   Stable, pt to continue medical treatment norvasc, lotensin

## 2021-07-08 NOTE — Progress Notes (Signed)
Patient ID: Darren Black, male   DOB: 21-Jul-1966, 54 y.o.   MRN: [id number]        Chief Complaint: follow up HTN, HLD and hyperglycemia, ckd       HPI:  Darren Black is a 55 y.o. male here overall doing ok, Pt denies chest pain, increased sob or doe, wheezing, orthopnea, PND, increased LE swelling, palpitations, dizziness or syncope   Pt denies polydipsia, polyuria, or new focal neuro s/s.   Pt denies fever, wt loss, night sweats, loss of appetite, or other constitutional symptoms  No other new complaints.  Due for flu shot, also due f/u cologuard       Wt Readings from Last 3 Encounters:  07/08/21 286 lb (129.7 kg)  03/22/21 283 lb 6.4 oz (128.5 kg)  01/01/21 275 lb 6.4 oz (124.9 kg)   BP Readings from Last 3 Encounters:  07/08/21 120/80  03/22/21 109/78  01/01/21 118/76         Past Medical History:  Diagnosis Date   Anxiety    BPH (benign prostatic hypertrophy)    Chronic lower back pain    nerve damage w/ leg pain   Depression    Diarrhea 03/23/2019   Family history of adverse reaction to anesthesia    MOTHER--- HARD TO WAKE   History of panic attacks    Hyperlipidemia    Hypertension    Meatal stenosis    OSA on CPAP    moderate to severe OSA  per study 04-28-2007 uses CPAP   PONV (postoperative nausea and vomiting)    Past Surgical History:  Procedure Laterality Date   APPENDECTOMY  2003   BREATH TEK H PYLORI  04/27/2012   Procedure: BREATH TEK H PYLORI;  Surgeon: Valarie Merino, MD;  Location: [facility];  Service: General;  Laterality: N/A;   CHOLECYSTECTOMY N/A 03/24/2019   Procedure: LAPAROSCOPIC CHOLECYSTECTOMY WITH INTRAOPERATIVE CHOLANGIOGRAM;  Surgeon: Manus Rudd, MD;  Location: [facility];  Service: General;  Laterality: N/A;   CYSTOSCOPY WITH URETHRAL DILATATION N/A 04/12/2015   Procedure: CYSTOSCOPY WITH URETHRAL DILATATION, RETROGRADE AND URETHROGRAM WITH BLADDER BIOPSY;  Surgeon: Bjorn Pippin, MD;  [facility];  Service: Urology;   Laterality: N/A;   ROTATOR CUFF REPAIR Right 01-21-2012   TRANSTHORACIC ECHOCARDIOGRAM  03-26-2007   normal LVSF, ef 60%,  mild AV calcification without stenosis,  mild MR and TR,  mild LAE   URETHROGRAM N/A 04/12/2015   Procedure: URETHROGRAM;  Surgeon: Bjorn Pippin, MD;  Location: [facility];  Service: Urology;  Laterality: N/A;    reports that he has never smoked. He has never used smokeless tobacco. He reports current alcohol use. He reports that he does not use drugs. family history includes Cancer in his father; Coronary artery disease in his father; Diabetes in an other family member; Hyperlipidemia in an other family member; Hypertension in an other family member; Stroke in an other family member. Allergies  Allergen Reactions   Crestor [Rosuvastatin Calcium] Other (See Comments)    abd pain   Current Outpatient Medications on File Prior to Visit  Medication Sig Dispense Refill   Amino Acids (COMPLETE AMINO ACID MIX PO) Take by mouth.     amLODipine (NORVASC) 10 MG tablet Take 1 tablet (10 mg total) by mouth daily. 90 tablet 3   benazepril (LOTENSIN) 40 MG tablet Take 1 tablet by mouth once daily 90 tablet 0   benzonatate (TESSALON) 200 MG capsule 1 tab by mouth three times  daily as needed 40 capsule 1   Cetirizine HCl (KLS ALLER-TEC PO) Take by mouth.     Cholecalciferol (D3 5000 PO) Take by mouth.     Coenzyme Q10 (COQ10) 100 MG CAPS Take 300 mg by mouth.     cyclobenzaprine (FLEXERIL) 10 MG tablet Take 1 tablet (10 mg total) by mouth at bedtime as needed for muscle spasms. 30 tablet 5   doxazosin (CARDURA) 4 MG tablet TAKE 1 TABLET BY MOUTH AT BEDTIME 90 tablet 0   doxycycline (VIBRA-TABS) 100 MG tablet Take 1 tablet (100 mg total) by mouth 2 (two) times daily. 20 tablet 0   DULoxetine (CYMBALTA) 60 MG capsule Take 1 capsule by mouth once daily in the morning 90 capsule 1   ezetimibe (ZETIA) 10 MG tablet Take 1 tablet (10 mg total) by mouth daily. 90 tablet 3    fluticasone (FLONASE) 50 MCG/ACT nasal spray Use 2 spray(s) in each nostril once daily 16 g 11   gabapentin (NEURONTIN) 300 MG capsule Take 1 capsule (300 mg total) by mouth 2 (two) times daily. 300 mg 2x/day x 1 week, then 600 mg 2x/day- for nerve pain 120 capsule 5   Ginger, Zingiber officinalis, (GINGER ROOT) 550 MG CAPS Take by mouth.     hydrochlorothiazide (HYDRODIURIL) 25 MG tablet Take 1/2 (one-half) tablet by mouth once daily 45 tablet 0   LORazepam (ATIVAN) 1 MG tablet Take 1 tablet by mouth twice daily as needed for anxiety 60 tablet 5   Omega 3-6-9 Fatty Acids (TRIPLE OMEGA COMPLEX PO) Take by mouth.     oxyCODONE-acetaminophen (PERCOCET/ROXICET) 5-325 MG tablet Take 1 tablet by mouth daily as needed for severe pain. 60 tablet 0   potassium chloride (KLOR-CON) 10 MEQ tablet Take 1 tablet by mouth once daily 90 tablet 0   pravastatin (PRAVACHOL) 80 MG tablet Take 1 tablet by mouth once daily 90 tablet 0   Saw Palmetto 1000 MG CAPS Take by mouth.     Sodium Hyaluronate, oral, (HYALURONIC ACID) 100 MG CAPS Take 200 mg by mouth.     tamsulosin (FLOMAX) 0.4 MG CAPS capsule Take 1 capsule by mouth once daily 90 capsule 0   testosterone (ANDROGEL) 50 MG/5GM (1%) GEL Place 5 g onto the skin daily. 450 g 1   Turmeric (QC TUMERIC COMPLEX) 500 MG CAPS Take by mouth.     vitamin E 1000 UNIT capsule Take 1,000 Units by mouth daily.     No current facility-administered medications on file prior to visit.        ROS:  All others reviewed and negative.  Objective        PE:  BP 120/80 (BP Location: Left Arm, Patient Position: Sitting, Cuff Size: Large)   Pulse 70   Temp 98.6 F (37 C) (Oral)   Ht 5\' 7"  (1.702 m)   Wt 286 lb (129.7 kg)   SpO2 97%   BMI 44.79 kg/m                 Constitutional: Pt appears in NAD               HENT: Head: NCAT.                Right Ear: External ear normal.                 Left Ear: External ear normal.                Eyes: .  Pupils are equal, round,  and reactive to light. Conjunctivae and EOM are normal               Nose: without d/c or deformity               Neck: Neck supple. Gross normal ROM               Cardiovascular: Normal rate and regular rhythm.                 Pulmonary/Chest: Effort normal and breath sounds without rales or wheezing.                Abd:  Soft, NT, ND, + BS, no organomegaly               Neurological: Pt is alert. At baseline orientation, motor grossly intact               Skin: Skin is warm. No rashes, no other new lesions, LE edema - none               Psychiatric: Pt behavior is normal without agitation   Micro: none  Cardiac tracings I have personally interpreted today:  none  Pertinent Radiological findings (summarize): none   Lab Results  Component Value Date   WBC 6.6 07/05/2021   HGB 14.3 07/05/2021   HCT 43.2 07/05/2021   PLT 189.0 07/05/2021   GLUCOSE 105 (H) 07/05/2021   CHOL 155 07/05/2021   TRIG 101.0 07/05/2021   HDL 45.70 07/05/2021   LDLDIRECT 139.0 01/02/2016   LDLCALC 89 07/05/2021   ALT 27 07/05/2021   AST 27 07/05/2021   NA 139 07/05/2021   K 4.0 07/05/2021   CL 101 07/05/2021   CREATININE 1.72 (H) 07/05/2021   BUN 15 07/05/2021   CO2 33 (H) 07/05/2021   TSH 1.24 07/05/2021   PSA 1.24 07/05/2021   HGBA1C 5.8 07/05/2021   MICROALBUR 1.2 07/05/2021   Assessment/Plan:  Darren Black is a 55 y.o. White or Caucasian [1] male with  has a past medical history of Anxiety, BPH (benign prostatic hypertrophy), Chronic lower back pain, Depression, Diarrhea (03/23/2019), Family history of adverse reaction to anesthesia, History of panic attacks, Hyperlipidemia, Hypertension, Meatal stenosis, OSA on CPAP, and PONV (postoperative nausea and vomiting).  No problem-specific Assessment & Plan notes found for this encounter.  Followup: No follow-ups on file.  Oliver Barre, MD 07/08/2021 2:41 PM [facility]

## 2021-07-08 NOTE — Patient Instructions (Addendum)
You had the flu shot today  Ok to hold the HCT 12.5 mg for now  Please continue all other medications as before, and refills have been done if requested.  Please have the pharmacy call with any other refills you may need.  Please continue your efforts at being more active, low cholesterol diet, and weight control.  You are otherwise up to date with prevention measures today.  Please keep your appointments with your specialists as you may have planned  Please go to the LAB at the blood drawing area for the tests to be done such as at the [facility] in 1 week  You will be contacted by phone if any changes need to be made immediately.  Otherwise, you will receive a letter about your results with an explanation, but please check with MyChart first.  Please remember to sign up for MyChart if you have not done so, as this will be important to you in the future with finding out test results, communicating by private email, and scheduling acute appointments online when needed.  Please make an Appointment to return in 6 months, or sooner if needed, also with Lab Appointment for testing done 3-5 days before at the FIRST FLOOR Lab (so this is for TWO appointments - please see the scheduling desk as you leave)  Due to the ongoing Covid 19 pandemic, our lab now requires an appointment for any labs done at our office.  If you need labs done and do not have an appointment, please call our office ahead of time to schedule before presenting to the lab for your testing.

## 2021-07-08 NOTE — Assessment & Plan Note (Signed)
Lab Results  Component Value Date   LDLCALC 89 07/05/2021   Stable, pt to continue current zetia

## 2021-07-08 NOTE — Assessment & Plan Note (Signed)
With mild acute on CKD - for to hold HCT for now, drink more fluids, f/u BMP in 1 wk

## 2021-07-08 NOTE — Assessment & Plan Note (Signed)
Lab Results  Component Value Date   HGBA1C 5.8 07/05/2021   Stable, pt to continue current medical treatment  - diet

## 2021-07-16 ENCOUNTER — Other Ambulatory Visit (INDEPENDENT_AMBULATORY_CARE_PROVIDER_SITE_OTHER): Payer: Medicare HMO

## 2021-07-16 ENCOUNTER — Encounter: Payer: Self-pay | Admitting: Internal Medicine

## 2021-07-16 DIAGNOSIS — N179 Acute kidney failure, unspecified: Secondary | ICD-10-CM | POA: Diagnosis not present

## 2021-07-16 DIAGNOSIS — R300 Dysuria: Secondary | ICD-10-CM | POA: Diagnosis not present

## 2021-07-16 LAB — BASIC METABOLIC PANEL
BUN: 14 mg/dL (ref 6–23)
CO2: 29 mEq/L (ref 19–32)
Calcium: 9.3 mg/dL (ref 8.4–10.5)
Chloride: 103 mEq/L (ref 96–112)
Creatinine, Ser: 1.64 mg/dL — ABNORMAL HIGH (ref 0.40–1.50)
GFR: 47.17 mL/min — ABNORMAL LOW (ref >60.00)
Glucose, Bld: 137 mg/dL — ABNORMAL HIGH (ref 70–99)
Potassium: 4.2 mEq/L (ref 3.5–5.1)
Sodium: 138 mEq/L (ref 135–145)

## 2021-07-16 LAB — URINALYSIS, ROUTINE W REFLEX MICROSCOPIC
Bilirubin Urine: NEGATIVE
Hgb urine dipstick: NEGATIVE
Ketones, ur: NEGATIVE
Leukocytes,Ua: NEGATIVE
Nitrite: NEGATIVE
RBC / HPF: NONE SEEN (ref 0–?)
Specific Gravity, Urine: <=1.005 — AB (ref 1.000–1.030)
Total Protein, Urine: NEGATIVE
Urine Glucose: NEGATIVE
Urobilinogen, UA: 0.2 (ref 0.0–1.0)
pH: 6.5 (ref 5.0–8.0)

## 2021-07-17 ENCOUNTER — Other Ambulatory Visit: Payer: Self-pay | Admitting: Internal Medicine

## 2021-07-17 LAB — URINE CULTURE
MICRO NUMBER:: 12639322
Result:: NO GROWTH
SPECIMEN QUALITY:: ADEQUATE

## 2021-07-17 NOTE — Telephone Encounter (Signed)
Please refill as per office routine med refill policy (all routine meds to be refilled for 3 mo or monthly (per pt preference) up to one year from last visit, then month to month grace period for 3 mo, then further med refills will have to be denied) ? ?

## 2021-07-18 ENCOUNTER — Encounter: Payer: Self-pay | Admitting: Internal Medicine

## 2021-07-21 ENCOUNTER — Other Ambulatory Visit: Payer: Self-pay | Admitting: Internal Medicine

## 2021-07-21 NOTE — Telephone Encounter (Signed)
Please refill as per office routine med refill policy (all routine meds to be refilled for 3 mo or monthly (per pt preference) up to one year from last visit, then month to month grace period for 3 mo, then further med refills will have to be denied) ? ?

## 2021-07-29 ENCOUNTER — Encounter: Payer: Self-pay | Admitting: Physical Medicine and Rehabilitation

## 2021-07-29 ENCOUNTER — Encounter: Payer: Medicare HMO | Attending: Physical Medicine and Rehabilitation | Admitting: Physical Medicine and Rehabilitation

## 2021-07-29 ENCOUNTER — Other Ambulatory Visit: Payer: Self-pay | Admitting: Physical Medicine and Rehabilitation

## 2021-07-29 ENCOUNTER — Other Ambulatory Visit: Payer: Self-pay

## 2021-07-29 VITALS — BP 137/92 | HR 74 | Temp 97.8°F | Ht 67.0 in | Wt 290.0 lb

## 2021-07-29 DIAGNOSIS — M5441 Lumbago with sciatica, right side: Secondary | ICD-10-CM | POA: Insufficient documentation

## 2021-07-29 DIAGNOSIS — G8929 Other chronic pain: Secondary | ICD-10-CM | POA: Insufficient documentation

## 2021-07-29 DIAGNOSIS — M5442 Lumbago with sciatica, left side: Secondary | ICD-10-CM | POA: Diagnosis not present

## 2021-07-29 DIAGNOSIS — N1831 Chronic kidney disease, stage 3a: Secondary | ICD-10-CM | POA: Insufficient documentation

## 2021-07-29 DIAGNOSIS — M4800 Spinal stenosis, site unspecified: Secondary | ICD-10-CM | POA: Insufficient documentation

## 2021-07-29 MED ORDER — OXYCODONE-ACETAMINOPHEN 5-325 MG PO TABS: 1.0000 | ORAL_TABLET | Freq: Every day | ORAL | 0 refills | Status: DC | PRN

## 2021-07-29 NOTE — Patient Instructions (Signed)
Pt is a 55 yr old male with hx of R shoulder pain due to complete supraspinatus tear HTN; no DM; HLD, BPH, on Flomax and Prazosin; and allergies and low testosterone, depression/anxiety on Lorazepam/Cymbalta; ;  Here for f/u of chronic low back pain.   Drinking 1 gallon of water daily- so should help his elevated Cr.   2.  Con't Flexeril - can take 1/2 tab at a time if makes him feel like jelly- don't need to change based on new CKD Stage IIIa. Will be checking labs again in 1 month.   3. Con't Percocet- last Rx 04/01/21. - #60 5/325 mg.   4. Stay away from Aleve- right now- stick with Tylenol right now until get new kidney labs. And if PCP is ok with using Aleve, then can use intermittently.    5. Can use the ball- or tennis ball- no more than 10-15 minutes max in a day. Don't want him to overdo it.    6.  F/U- 3 months

## 2021-07-29 NOTE — Progress Notes (Signed)
Subjective:    Patient ID: Darren Black, male    DOB: [birth year], 55 y.o.   MRN: [id number]  HPI  Pt is a 55 yr old male with hx of R shoulder pain due to complete supraspinatus tear HTN; no DM; HLD, BPH, on Flomax and Prazosin; and allergies and low testosterone, depression/anxiety on Lorazepam/Cymbalta; ;  Here for f/u on chronic low back pain. Taken off Benzapril initially, but back on it-  Was dehydrated at the time.   Was getting lightheaded in AM when peed. Felt much better after drank more water.    Didn't like the Gabapentin-  had swelling in legs- stopped it.   Takes Flexeril- likes it- ~ 1x/week- feels jelly- so loose.  Pain is still there- makes him feel weaker-   Takes Oxycodone 1/2 pill in Am and 1/2 at night. Takes 3-4x/week-  Tries to take an Aleve- Cr is 1.64 and GFR 47   Keeps going to gym- walks a few laps- then rests/sits down.    Pain Inventory Average Pain 8 Pain Right Now 8 My pain is sharp, dull, stabbing, tingling, and aching  In the last 24 hours, has pain interfered with the following? General activity 9 Relation with others 8 Enjoyment of life 9 What TIME of day is your pain at its worst? night Sleep (in general) Fair  Pain is worse with: walking, bending, standing, and some activites Pain improves with: rest, heat/ice, and medication Relief from Meds: 5  Family History  Problem Relation Age of Onset   Coronary artery disease Father    Cancer Father        anaplastic thyroid cancer   Stroke Other        1st degree relative   Diabetes Other        1st degree relative   Hypertension Other    Hyperlipidemia Other    Social History   Socioeconomic History   Marital status: Married    Spouse name: Not on file   Number of children: Not on file   Years of education: Not on file   Highest education level: Not on file  Occupational History   Occupation: disabled, LBP and anxiety  Tobacco Use   Smoking status: Never   Smokeless  tobacco: Never  Vaping Use   Vaping Use: Never used  Substance and Sexual Activity   Alcohol use: Yes    Comment: rare   Drug use: No   Sexual activity: Not on file  Other Topics Concern   Not on file  Social History Narrative   Not on file   Social Determinants of Health   Financial Resource Strain: Low Risk    Difficulty of Paying Living Expenses: Not hard at all  Food Insecurity: No Food Insecurity   Worried About Programme researcher, broadcasting/film/video in the Last Year: Never true   Ran Out of Food in the Last Year: Never true  Transportation Needs: No Transportation Needs   Lack of Transportation (Medical): No   Lack of Transportation (Non-Medical): No  Physical Activity: Sufficiently Active   Days of Exercise per Week: 5 days   Minutes of Exercise per Session: 30 min  Stress: No Stress Concern Present   Feeling of Stress : Not at all  Social Connections: Moderately Isolated   Frequency of Communication with Friends and Family: More than three times a week   Frequency of Social Gatherings with Friends and Family: Once a week   Attends Religious Services: Never  Active Member of Clubs or Organizations: No   Attends Archivist Meetings: Never   Marital Status: Married   Past Surgical History:  Procedure Laterality Date   APPENDECTOMY  2003   BREATH Eliezer Champagne  04/27/2012   Procedure: BREATH TEK H PYLORI;  Surgeon: Pedro Earls, MD;  Location: [facility];  Service: General;  Laterality: N/A;   CHOLECYSTECTOMY N/A 03/24/2019   Procedure: LAPAROSCOPIC CHOLECYSTECTOMY WITH INTRAOPERATIVE CHOLANGIOGRAM;  Surgeon: Donnie Mesa, MD;  Location: [facility];  Service: General;  Laterality: N/A;   CYSTOSCOPY WITH URETHRAL DILATATION N/A 04/12/2015   Procedure: CYSTOSCOPY WITH URETHRAL DILATATION, RETROGRADE AND URETHROGRAM WITH BLADDER BIOPSY;  Surgeon: Irine Seal, MD;  Location: [facility];  Service: Urology;  Laterality: N/A;   ROTATOR CUFF REPAIR Right 01-21-2012    TRANSTHORACIC ECHOCARDIOGRAM  03-26-2007   normal LVSF, ef 60%,  mild AV calcification without stenosis,  mild MR and TR,  mild LAE   URETHROGRAM N/A 04/12/2015   Procedure: URETHROGRAM;  Surgeon: Irine Seal, MD;  Location[facility];  Service: Urology;  Laterality: N/A;   Past Surgical History:  Procedure Laterality Date   APPENDECTOMY  2003   BREATH TEK H PYLORI  04/27/2012   Procedure: BREATH TEK H PYLORI;  Surgeon: Pedro Earls, MD;  Location: [facility];  Service: General;  Laterality: N/A;   CHOLECYSTECTOMY N/A 03/24/2019   Procedure: LAPAROSCOPIC CHOLECYSTECTOMY WITH INTRAOPERATIVE CHOLANGIOGRAM;  Surgeon: Donnie Mesa, MD;  Location: [facility];  Service: General;  Laterality: N/A;   CYSTOSCOPY WITH URETHRAL DILATATION N/A 04/12/2015   Procedure: CYSTOSCOPY WITH URETHRAL DILATATION, RETROGRADE AND URETHROGRAM WITH BLADDER BIOPSY;  Surgeon: Irine Seal, MD;  Location: [facility];  Service: Urology;  Laterality: N/A;   ROTATOR CUFF REPAIR Right 01-21-2012   TRANSTHORACIC ECHOCARDIOGRAM  03-26-2007   normal LVSF, ef 60%,  mild AV calcification without stenosis,  mild MR and TR,  mild LAE   URETHROGRAM N/A 04/12/2015   Procedure: URETHROGRAM;  Surgeon: Irine Seal, MD;  Location: [facility];  Service: Urology;  Laterality: N/A;   Past Medical History:  Diagnosis Date   Anxiety    BPH (benign prostatic hypertrophy)    Chronic lower back pain    nerve damage w/ leg pain   Depression    Diarrhea 03/23/2019   Family history of adverse reaction to anesthesia    MOTHER--- HARD TO WAKE   History of panic attacks    Hyperlipidemia    Hypertension    Meatal stenosis    OSA on CPAP    moderate to severe OSA  per study 04-28-2007 uses CPAP   PONV (postoperative nausea and vomiting)    BP (!) 137/92   Pulse 74   Temp 97.8 F (36.6 C)   Ht 5\' 7"  (1.702 m)   Wt 290 lb (131.5 kg)   SpO2 97%   BMI 45.42 kg/m   Opioid Risk Score:   Fall  Risk Score:  `1  Depression screen PHQ 2/9  Depression screen [facility] 2/9 07/29/2021 03/22/2021 01/01/2021 01/01/2021 08/07/2020 01/18/2020 01/18/2020  Decreased Interest 0 3 0 0 0 0 0  Down, Depressed, Hopeless 0 3 0 0 1 0 0  PHQ - 2 Score 0 6 0 0 1 0 0  Altered sleeping - 3 - - - - -  Tired, decreased energy - 3 - - - - -  Change in appetite - 3 - - - - -  Feeling  bad or failure about yourself  - 3 - - - - -  Trouble concentrating - 3 - - - - -  Moving slowly or fidgety/restless - 2 - - - - -  Suicidal thoughts - 0 - - - - -  PHQ-9 Score - 23 - - - - -  Difficult doing work/chores - Somewhat difficult - - - - -  Some recent data might be hidden     Review of Systems  Constitutional: Negative.   HENT: Negative.    Eyes: Negative.   Respiratory: Negative.    Cardiovascular: Negative.   Gastrointestinal: Negative.   Endocrine: Negative.   Genitourinary: Negative.   Musculoskeletal:  Positive for back pain.       Groin pain left thigh  Skin: Negative.   Allergic/Immunologic: Negative.   Neurological: Negative.   Hematological: Negative.   Psychiatric/Behavioral: Negative.        Objective:   Physical Exam  Awake, alert, appropriate, BMI 45; accompanied by wife, NAD TTP across low back in band across band.       Assessment & Plan:   Pt is a 55 yr old male with hx of R shoulder pain due to complete supraspinatus tear HTN; no DM; HLD, BPH, on Flomax and Prazosin; and allergies and low testosterone, depression/anxiety on Lorazepam/Cymbalta; ;  Here for f/u of chronic low back pain.   Drinking 1 gallon of water daily- so should help his elevated Cr.   2.  Con't Flexeril - can take 1/2 tab at a time if makes him feel like jelly- don't need to change based on new CKD Stage IIIa. Will be checking labs again in 1 month.   3. Con't Percocet- last Rx 04/01/21. - #60 5/325 mg.   4. Stay away from [place]- right now- stick with Tylenol right now until get new kidney labs. And if PCP is ok  with using Aleve, then can use intermittently.    5. Can use the ball- or tennis ball- no more than 10-15 minutes max in a day. Don't want him to overdo it.    6.  F/U- 3 months On chronic back pain.    I spent a total of 21 minutes on visit- as detailed above- specifically going over MRI results and pain meds.

## 2021-08-06 DIAGNOSIS — Z1211 Encounter for screening for malignant neoplasm of colon: Secondary | ICD-10-CM | POA: Diagnosis not present

## 2021-08-10 LAB — COLOGUARD: COLOGUARD: NEGATIVE

## 2021-08-11 ENCOUNTER — Encounter: Payer: Self-pay | Admitting: Internal Medicine

## 2021-08-13 ENCOUNTER — Other Ambulatory Visit: Payer: Self-pay | Admitting: Internal Medicine

## 2021-08-28 ENCOUNTER — Telehealth: Payer: Self-pay | Admitting: Internal Medicine

## 2021-08-28 NOTE — Telephone Encounter (Signed)
Left message for patient to call back to schedule Medicare Annual Wellness Visit   Last AWV  08/07/20  Please schedule at anytime with [facility] Advisor if patient calls the office back.    40 Minutes appointment   Any questions, please call me at [phone]

## 2021-09-04 ENCOUNTER — Other Ambulatory Visit: Payer: Self-pay | Admitting: Internal Medicine

## 2021-09-23 ENCOUNTER — Telehealth: Payer: Self-pay | Admitting: *Deleted

## 2021-09-23 NOTE — Telephone Encounter (Signed)
Darren Black caaled for a refill on his oxycodone. He has only a week left. Per PMP it was filled last 07/29/21.

## 2021-09-24 MED ORDER — OXYCODONE-ACETAMINOPHEN 5-325 MG PO TABS: 1.0000 | ORAL_TABLET | Freq: Every day | ORAL | 0 refills | Status: DC | PRN

## 2021-09-30 ENCOUNTER — Other Ambulatory Visit: Payer: Self-pay | Admitting: Internal Medicine

## 2021-09-30 NOTE — Telephone Encounter (Signed)
Please refill as per office routine med refill policy (all routine meds to be refilled for 3 mo or monthly (per pt preference) up to one year from last visit, then month to month grace period for 3 mo, then further med refills will have to be denied) ? ?

## 2021-10-04 ENCOUNTER — Other Ambulatory Visit: Payer: Self-pay | Admitting: Internal Medicine

## 2021-10-15 ENCOUNTER — Other Ambulatory Visit: Payer: Self-pay | Admitting: Internal Medicine

## 2021-10-15 DIAGNOSIS — R739 Hyperglycemia, unspecified: Secondary | ICD-10-CM

## 2021-10-15 DIAGNOSIS — E291 Testicular hypofunction: Secondary | ICD-10-CM

## 2021-10-15 DIAGNOSIS — Z0000 Encounter for general adult medical examination without abnormal findings: Secondary | ICD-10-CM

## 2021-10-15 DIAGNOSIS — E538 Deficiency of other specified B group vitamins: Secondary | ICD-10-CM

## 2021-10-15 DIAGNOSIS — E7800 Pure hypercholesterolemia, unspecified: Secondary | ICD-10-CM

## 2021-10-15 DIAGNOSIS — E559 Vitamin D deficiency, unspecified: Secondary | ICD-10-CM

## 2021-10-30 ENCOUNTER — Encounter: Payer: Medicare HMO | Attending: Physical Medicine and Rehabilitation | Admitting: Physical Medicine and Rehabilitation

## 2021-10-30 ENCOUNTER — Other Ambulatory Visit: Payer: Self-pay

## 2021-10-30 ENCOUNTER — Encounter: Payer: Self-pay | Admitting: Physical Medicine and Rehabilitation

## 2021-10-30 VITALS — BP 120/67 | HR 67 | Ht 67.0 in | Wt 273.0 lb

## 2021-10-30 DIAGNOSIS — Z79891 Long term (current) use of opiate analgesic: Secondary | ICD-10-CM | POA: Diagnosis not present

## 2021-10-30 DIAGNOSIS — Z5181 Encounter for therapeutic drug level monitoring: Secondary | ICD-10-CM | POA: Insufficient documentation

## 2021-10-30 DIAGNOSIS — G894 Chronic pain syndrome: Secondary | ICD-10-CM | POA: Insufficient documentation

## 2021-10-30 DIAGNOSIS — M5442 Lumbago with sciatica, left side: Secondary | ICD-10-CM | POA: Diagnosis not present

## 2021-10-30 DIAGNOSIS — G8929 Other chronic pain: Secondary | ICD-10-CM | POA: Insufficient documentation

## 2021-10-30 DIAGNOSIS — M5441 Lumbago with sciatica, right side: Secondary | ICD-10-CM | POA: Insufficient documentation

## 2021-10-30 MED ORDER — OXYCODONE-ACETAMINOPHEN 5-325 MG PO TABS: 1.0000 | ORAL_TABLET | Freq: Every day | ORAL | 0 refills | Status: DC | PRN

## 2021-10-30 MED ORDER — CYCLOBENZAPRINE HCL 5 MG PO TABS: 5.0000 mg | ORAL_TABLET | Freq: Three times a day (TID) | ORAL | 5 refills | Status: DC | PRN

## 2021-10-30 NOTE — Progress Notes (Signed)
? ?Subjective:  ? ? Patient ID: Darren Black, male    DOB: Jan 09, 1966, 56 y.o.   MRN: [id number] ? ?HPI ?Pt is a 56 yr old male with hx of R shoulder pain due to complete supraspinatus tear ?HTN; no DM; HLD, BPH, on Flomax and Prazosin; and allergies and low testosterone, depression/anxiety on Lorazepam/Cymbalta; ;  ?Here for f/u of chronic low back pain. ? ? ?Things going "well".  ? ?Drinking ~ 1 gallon /day- urinating a lot.  ?Not as dizzy/lightheaded-doing better.  ?Sees PCP May 9th-  ? ? ?Back pain wise- about the same.  ?Still taking Flexeril 2x/week-  ?If really tight- asking if can break it in half.  ? ? ?Got pain meds last 1/24-  ?Percocet- 1/2 tab in Am and 1/2 tab at night. Never takes a whole pill at one time.  ? ?If feels like can do without it, then does.  ? ?Wife making him walk- at least 1.5 miles- if can 5 days/week.  ?Usually gets 2500-3000 steps/day.  ? ?Pain Inventory ?Average Pain 8 ?Pain Right Now 8 ?My pain is constant, sharp, dull, tingling, aching, and pulsating ? ?In the last 24 hours, has pain interfered with the following? ?General activity 8 ?Relation with others 8 ?Enjoyment of life 8 ?What TIME of day is your pain at its worst? morning , daytime, evening, and night ?Sleep (in general) Poor ? ?Pain is worse with: walking, bending, sitting, inactivity, and standing ?Pain improves with: rest and medication ?Relief from Meds: 6 ? ?Family History  ?Problem Relation Age of Onset  ? Coronary artery disease Father   ? Cancer Father   ?     anaplastic thyroid cancer  ? Stroke Other   ?     1st degree relative  ? Diabetes Other   ?     1st degree relative  ? Hypertension Other   ? Hyperlipidemia Other   ? ?Social History  ? ?Socioeconomic History  ? Marital status: Married  ?  Spouse name: Not on file  ? Number of children: Not on file  ? Years of education: Not on file  ? Highest education level: Not on file  ?Occupational History  ? Occupation: disabled, LBP and anxiety  ?Tobacco Use  ? Smoking  status: Never  ? Smokeless tobacco: Never  ?Vaping Use  ? Vaping Use: Never used  ?Substance and Sexual Activity  ? Alcohol use: Yes  ?  Comment: rare  ? Drug use: No  ? Sexual activity: Not on file  ?Other Topics Concern  ? Not on file  ?Social History Narrative  ? Not on file  ? ?Social Determinants of Health  ? ?Financial Resource Strain: Not on file  ?Food Insecurity: Not on file  ?Transportation Needs: Not on file  ?Physical Activity: Not on file  ?Stress: Not on file  ?Social Connections: Not on file  ? ?Past Surgical History:  ?Procedure Laterality Date  ? APPENDECTOMY  2003  ? BREATH TEK H PYLORI  04/27/2012  ? Procedure: BREATH TEK H PYLORI;  Surgeon: Valarie Merino, MD;  Location: [facility];  Service: General;  Laterality: N/A;  ? CHOLECYSTECTOMY N/A 03/24/2019  ? Procedure: LAPAROSCOPIC CHOLECYSTECTOMY WITH INTRAOPERATIVE CHOLANGIOGRAM;  Surgeon: Manus Rudd, MD;  Location: [facility];  Service: General;  Laterality: N/A;  ? CYSTOSCOPY WITH URETHRAL DILATATION N/A 04/12/2015  ? Procedure: CYSTOSCOPY WITH URETHRAL DILATATION, RETROGRADE AND URETHROGRAM WITH BLADDER BIOPSY;  Surgeon: Bjorn Pippin, MD;  Location: [facility];  Service: Urology;  Laterality: N/A;  ? ROTATOR CUFF REPAIR Right 01-21-2012  ? TRANSTHORACIC ECHOCARDIOGRAM  03-26-2007  ? normal LVSF, ef 60%,  mild AV calcification without stenosis,  mild MR and TR,  mild LAE  ? URETHROGRAM N/A 04/12/2015  ? Procedure: URETHROGRAM;  Surgeon: Bjorn Pippin, MD;  Location: [facility];  Service: Urology;  Laterality: N/A;  ? ?Past Surgical History:  ?Procedure Laterality Date  ? APPENDECTOMY  2003  ? BREATH TEK H PYLORI  04/27/2012  ? Procedure: BREATH TEK H PYLORI;  Surgeon: Valarie Merino, MD;  Location: [facility];  Service: General;  Laterality: N/A;  ? CHOLECYSTECTOMY N/A 03/24/2019  ? Procedure: LAPAROSCOPIC CHOLECYSTECTOMY WITH INTRAOPERATIVE CHOLANGIOGRAM;  Surgeon: Manus Rudd, MD;  Location: [facility];  Service:  General;  Laterality: N/A;  ? CYSTOSCOPY WITH URETHRAL DILATATION N/A 04/12/2015  ? Procedure: CYSTOSCOPY WITH URETHRAL DILATATION, RETROGRADE AND URETHROGRAM WITH BLADDER BIOPSY;  Surgeon: Bjorn Pippin, MD;  Location: [facility];  Service: Urology;  Laterality: N/A;  ? ROTATOR CUFF REPAIR Right 01-21-2012  ? TRANSTHORACIC ECHOCARDIOGRAM  03-26-2007  ? normal LVSF, ef 60%,  mild AV calcification without stenosis,  mild MR and TR,  mild LAE  ? URETHROGRAM N/A 04/12/2015  ? Procedure: URETHROGRAM;  Surgeon: Bjorn Pippin, MD;  Location: [facility];  Service: Urology;  Laterality: N/A;  ? ?Past Medical History:  ?Diagnosis Date  ? Anxiety   ? BPH (benign prostatic hypertrophy)   ? Chronic lower back pain   ? nerve damage w/ leg pain  ? Depression   ? Diarrhea 03/23/2019  ? Family history of adverse reaction to anesthesia   ? MOTHER--- HARD TO WAKE  ? History of panic attacks   ? Hyperlipidemia   ? Hypertension   ? Meatal stenosis   ? OSA on CPAP   ? moderate to severe OSA  per study 04-28-2007 uses CPAP  ? PONV (postoperative nausea and vomiting)   ? ?There were no vitals taken for this visit. ? ?Opioid Risk Score:   ?Fall Risk Score:  `1 ? ?Depression screen PHQ 2/9 ? ?Depression screen [facility] 2/9 07/29/2021 03/22/2021 01/01/2021 01/01/2021 08/07/2020 01/18/2020 01/18/2020  ?Decreased Interest 0 3 0 0 0 0 0  ?Down, Depressed, Hopeless 0 3 0 0 1 0 0  ?PHQ - 2 Score 0 6 0 0 1 0 0  ?Altered sleeping - 3 - - - - -  ?Tired, decreased energy - 3 - - - - -  ?Change in appetite - 3 - - - - -  ?Feeling bad or failure about yourself  - 3 - - - - -  ?Trouble concentrating - 3 - - - - -  ?Moving slowly or fidgety/restless - 2 - - - - -  ?Suicidal thoughts - 0 - - - - -  ?PHQ-9 Score - 23 - - - - -  ?Difficult doing work/chores - Somewhat difficult - - - - -  ?Some recent data might be hidden  ? ? ?Review of Systems  ?Musculoskeletal:  Positive for back pain, gait problem and neck pain.  ?     Right side shoulder  pain  ?Neurological:  Positive for numbness (right leg numbness).  ?All other systems reviewed and are negative. ? ?   ?Objective:  ? Physical Exam ? ?Awake, alert, appropriate, accompanied by wife, sitting on table, NAD ?TTP acorss low back in band- with paraspinal tightness ? ? ?   ?Assessment & Plan:  ? ?  Pt is a 56 yr old male with hx of R shoulder pain due to complete supraspinatus tear ?HTN; no DM; HLD, BPH, on Flomax and Prazosin; and allergies and low testosterone, depression/anxiety on Lorazepam/Cymbalta; ;  ?Here for f/u of chronic low back pain. ? ? ?Can break Flexeril in half to take as needed. For pain.  ?Will change to to 5 mg tab- can take 1-2 tabs as needed for pain.   ? ?2. Con't Percocet- hasn't gotten refilled in >1 month, so will refill. 5/325 #60- no refills ? ? ?3.  Will wait on other meds since doesn't want kidneys to take another hit- Wait on naltrexone, Trileptal.  ? ?4. Suggest if kidney function not better, that you get renal/kidney doctor.  ? ?5. F/U- 3 months- UDS today.  ? ?I spent a total of  21  minutes on total care today- >50% coordination of care- due to  ? ?

## 2021-10-30 NOTE — Patient Instructions (Signed)
Pt is a 56 yr old male with hx of R shoulder pain due to complete supraspinatus tear ?HTN; no DM; HLD, BPH, on Flomax and Prazosin; and allergies and low testosterone, depression/anxiety on Lorazepam/Cymbalta; ;  ?Here for f/u of chronic low back pain. ? ? ?Can break Flexeril in half to take as needed. For pain.  ?Will change to to 5 mg tab- can take 1-2 tabs as needed for pain.   ? ?2. Con't Percocet- hasn't gotten refilled in >1 month, so will refill. 5/325 #60- no refills ? ? ?3.  Will wait on other meds since doesn't want kidneys to take another hit- Wait on naltrexone, Trileptal.  ? ?4. Suggest if kidney function not better, that you get renal/kidney doctor.  ? ?5. F/U- 3 months- UDS today.  ?

## 2021-11-03 LAB — TOXASSURE SELECT,+ANTIDEPR,UR

## 2021-11-04 ENCOUNTER — Telehealth: Payer: Self-pay | Admitting: *Deleted

## 2021-11-04 NOTE — Telephone Encounter (Signed)
Urine drug screen for this encounter is consistent for prescribed medication 

## 2021-11-21 ENCOUNTER — Telehealth: Payer: Self-pay | Admitting: *Deleted

## 2021-11-21 NOTE — Telephone Encounter (Signed)
Darren Black called for a refill on [name]'s oxycodone. Looks like by PMP not filled since January so I have let her know that there should be an Rx on hold with the pharmacy sent 10/30/21. ?

## 2021-12-04 ENCOUNTER — Other Ambulatory Visit: Payer: Self-pay | Admitting: Internal Medicine

## 2021-12-30 ENCOUNTER — Other Ambulatory Visit: Payer: Self-pay | Admitting: Internal Medicine

## 2021-12-30 NOTE — Telephone Encounter (Signed)
Please refill as per office routine med refill policy (all routine meds to be refilled for 3 mo or monthly (per pt preference) up to one year from last visit, then month to month grace period for 3 mo, then further med refills will have to be denied) ? ?

## 2022-01-01 ENCOUNTER — Telehealth: Payer: Self-pay

## 2022-01-01 NOTE — Telephone Encounter (Signed)
Called patient. LVM asking him to give our office a call back to schedule his annual wellness visit. ? ?If patient calls back please schedule him on "[facility] AWV Nurse" on Friday morning, anytime Monday or Wednesday afternoon. ?

## 2022-01-03 ENCOUNTER — Other Ambulatory Visit (INDEPENDENT_AMBULATORY_CARE_PROVIDER_SITE_OTHER): Payer: Medicare HMO

## 2022-01-03 DIAGNOSIS — E538 Deficiency of other specified B group vitamins: Secondary | ICD-10-CM | POA: Diagnosis not present

## 2022-01-03 DIAGNOSIS — E559 Vitamin D deficiency, unspecified: Secondary | ICD-10-CM | POA: Diagnosis not present

## 2022-01-03 DIAGNOSIS — R739 Hyperglycemia, unspecified: Secondary | ICD-10-CM | POA: Diagnosis not present

## 2022-01-03 DIAGNOSIS — E7800 Pure hypercholesterolemia, unspecified: Secondary | ICD-10-CM | POA: Diagnosis not present

## 2022-01-03 DIAGNOSIS — Z0000 Encounter for general adult medical examination without abnormal findings: Secondary | ICD-10-CM | POA: Diagnosis not present

## 2022-01-03 LAB — TSH: TSH: 1.74 u[IU]/mL (ref 0.35–5.50)

## 2022-01-03 LAB — URINALYSIS, ROUTINE W REFLEX MICROSCOPIC
Bilirubin Urine: NEGATIVE
Hgb urine dipstick: NEGATIVE
Ketones, ur: NEGATIVE
Leukocytes,Ua: NEGATIVE
Nitrite: NEGATIVE
RBC / HPF: NONE SEEN (ref 0–?)
Specific Gravity, Urine: <=1.005 — AB (ref 1.000–1.030)
Total Protein, Urine: NEGATIVE
Urine Glucose: NEGATIVE
Urobilinogen, UA: 0.2 (ref 0.0–1.0)
pH: 6 (ref 5.0–8.0)

## 2022-01-03 LAB — BASIC METABOLIC PANEL
BUN: 19 mg/dL (ref 6–23)
CO2: 28 mEq/L (ref 19–32)
Calcium: 9.1 mg/dL (ref 8.4–10.5)
Chloride: 102 mEq/L (ref 96–112)
Creatinine, Ser: 1.41 mg/dL (ref 0.40–1.50)
GFR: 56.36 mL/min — ABNORMAL LOW (ref >60.00)
Glucose, Bld: 96 mg/dL (ref 70–99)
Potassium: 4 mEq/L (ref 3.5–5.1)
Sodium: 139 mEq/L (ref 135–145)

## 2022-01-03 LAB — HEPATIC FUNCTION PANEL
ALT: 28 U/L (ref 0–53)
AST: 24 U/L (ref 0–37)
Albumin: 4.3 g/dL (ref 3.5–5.2)
Alkaline Phosphatase: 43 U/L (ref 39–117)
Bilirubin, Direct: 0.1 mg/dL (ref 0.0–0.3)
Total Bilirubin: 0.6 mg/dL (ref 0.2–1.2)
Total Protein: 6.7 g/dL (ref 6.0–8.3)

## 2022-01-03 LAB — HEMOGLOBIN A1C: Hgb A1c MFr Bld: 5.4 % (ref 4.6–6.5)

## 2022-01-03 LAB — LIPID PANEL
Cholesterol: 140 mg/dL (ref 0–200)
HDL: 48.1 mg/dL (ref >39.00)
LDL Cholesterol: 78 mg/dL (ref 0–99)
NonHDL: 92.37
Total CHOL/HDL Ratio: 3
Triglycerides: 71 mg/dL (ref 0.0–149.0)
VLDL: 14.2 mg/dL (ref 0.0–40.0)

## 2022-01-03 LAB — CBC WITH DIFFERENTIAL/PLATELET
Basophils Absolute: 0.1 10*3/uL (ref 0.0–0.1)
Basophils Relative: 1.1 % (ref 0.0–3.0)
Eosinophils Absolute: 0.2 10*3/uL (ref 0.0–0.7)
Eosinophils Relative: 3.9 % (ref 0.0–5.0)
HCT: 41.7 % (ref 39.0–52.0)
Hemoglobin: 13.9 g/dL (ref 13.0–17.0)
Lymphocytes Relative: 22.9 % (ref 12.0–46.0)
Lymphs Abs: 1.4 10*3/uL (ref 0.7–4.0)
MCHC: 33.4 g/dL (ref 30.0–36.0)
MCV: 81.2 fl (ref 78.0–100.0)
Monocytes Absolute: 0.5 10*3/uL (ref 0.1–1.0)
Monocytes Relative: 8.6 % (ref 3.0–12.0)
Neutro Abs: 4 10*3/uL (ref 1.4–7.7)
Neutrophils Relative %: 63.5 % (ref 43.0–77.0)
Platelets: 179 10*3/uL (ref 150.0–400.0)
RBC: 5.13 Mil/uL (ref 4.22–5.81)
RDW: 14.2 % (ref 11.5–15.5)
WBC: 6.3 10*3/uL (ref 4.0–10.5)

## 2022-01-03 LAB — PSA: PSA: 1.07 ng/mL (ref 0.10–4.00)

## 2022-01-03 LAB — VITAMIN D 25 HYDROXY (VIT D DEFICIENCY, FRACTURES): VITD: 59.24 ng/mL (ref 30.00–100.00)

## 2022-01-03 LAB — VITAMIN B12: Vitamin B-12: 867 pg/mL (ref 211–911)

## 2022-01-07 ENCOUNTER — Encounter: Payer: Self-pay | Admitting: Internal Medicine

## 2022-01-07 ENCOUNTER — Ambulatory Visit (INDEPENDENT_AMBULATORY_CARE_PROVIDER_SITE_OTHER): Payer: Medicare HMO | Admitting: Internal Medicine

## 2022-01-07 VITALS — BP 122/80 | HR 73 | Temp 98.3°F | Ht 67.0 in | Wt 263.0 lb

## 2022-01-07 DIAGNOSIS — R739 Hyperglycemia, unspecified: Secondary | ICD-10-CM

## 2022-01-07 DIAGNOSIS — Z0001 Encounter for general adult medical examination with abnormal findings: Secondary | ICD-10-CM | POA: Diagnosis not present

## 2022-01-07 DIAGNOSIS — I10 Essential (primary) hypertension: Secondary | ICD-10-CM

## 2022-01-07 DIAGNOSIS — N1831 Chronic kidney disease, stage 3a: Secondary | ICD-10-CM

## 2022-01-07 DIAGNOSIS — E7800 Pure hypercholesterolemia, unspecified: Secondary | ICD-10-CM | POA: Diagnosis not present

## 2022-01-07 DIAGNOSIS — E291 Testicular hypofunction: Secondary | ICD-10-CM

## 2022-01-07 MED ORDER — HYDROCHLOROTHIAZIDE 25 MG PO TABS: ORAL_TABLET | ORAL | 3 refills | Status: DC

## 2022-01-07 MED ORDER — POTASSIUM CHLORIDE ER 10 MEQ PO TBCR
10.0000 meq | EXTENDED_RELEASE_TABLET | Freq: Every day | ORAL | 3 refills | Status: DC
Start: 2022-01-07 — End: 2023-01-16

## 2022-01-07 MED ORDER — BENAZEPRIL HCL 40 MG PO TABS: 40.0000 mg | ORAL_TABLET | Freq: Every day | ORAL | 3 refills | Status: DC

## 2022-01-07 NOTE — Patient Instructions (Signed)
Ok to restart the testosterone ? ?We have discussed the Cardiac CT Score test to measure the calcification level (if any) in your heart arteries.  This test has been ordered in our [place], so please call [place] [facility] directly, as they prefer this, at [phone] to be scheduled. ? ?Please continue all other medications as before, and refills have been done if requested. ? ?Please have the pharmacy call with any other refills you may need. ? ?Please continue your efforts at being more active, low cholesterol diet, and weight control. ? ?You are otherwise up to date with prevention measures today. ? ?Please keep your appointments with your specialists as you may have planned ? ?Please make an Appointment to return in 6 months, or sooner if needed, also with Lab Appointment for testing done 3-5 days before at the [facility] (so this is for TWO appointments - please see the scheduling desk as you leave) ? ?Due to the ongoing Covid 19 pandemic, our lab now requires an appointment for any labs done at our office.  If you need labs done and do not have an appointment, please call our office ahead of time to schedule before presenting to the lab for your testing. ? ? ? ? ? ? ?

## 2022-01-11 NOTE — Assessment & Plan Note (Signed)
Lab Results  ?Component Value Date  ? CREATININE 1.41 01/03/2022  ? ?Stable overall, cont to avoid nephrotoxins ? ?

## 2022-01-11 NOTE — Assessment & Plan Note (Addendum)
Lab Results  ?Component Value Date  ? LDLCALC 78 01/03/2022  ? ?uncontrolled,  Goal LDL < 70, pt to continue current statin pravachol 80, declines change for now, for lower chol diet; also for cardiac CT score ? ?

## 2022-01-11 NOTE — Progress Notes (Signed)
Patient ID: Darren Black, male   DOB: 1965-12-03, 56 y.o.   MRN: [id number] ? ? ? ?     Chief Complaint:: wellness exam and low testosterone, cardiac risk eval, hld, htn, hyperglycemia, ckd ? ?     HPI:  Darren Black is a 56 y.o. male here for wellness exam; declines covid booster, shingrix o/w up to date ?         ?              Also asks to restart testosterone due to low stamina and mood.  Pt denies chest pain, increased sob or doe, wheezing, orthopnea, PND, increased LE swelling, palpitations, dizziness or syncope.   Pt denies polydipsia, polyuria, or new focal neuro s/s.    Pt denies fever, wt loss, night sweats, loss of appetite, or other constitutional symptoms  Trying to follow lower chol diet.  Good compliace overall with meds.   ?  ?Wt Readings from Last 3 Encounters:  ?01/07/22 263 lb (119.3 kg)  ?10/30/21 273 lb (123.8 kg)  ?07/29/21 290 lb (131.5 kg)  ? ?BP Readings from Last 3 Encounters:  ?01/07/22 122/80  ?10/30/21 120/67  ?07/29/21 (!) 137/92  ? ?Immunization History  ?Administered Date(s) Administered  ? Influenza Split 08/28/2011  ? Influenza,inj,Quad PF,6+ Mos 07/28/2017, 07/12/2018, 05/05/2019  ? Influenza-Unspecified 07/20/2020  ? PFIZER(Purple Top)SARS-COV-2 Vaccination 10/13/2019, 11/07/2019, 05/28/2020, 01/14/2021  ? Tdap 01/21/2017  ?There are no preventive care reminders to display for this patient. ?  ? ?Past Medical History:  ?Diagnosis Date  ? Anxiety   ? BPH (benign prostatic hypertrophy)   ? Chronic lower back pain   ? nerve damage w/ leg pain  ? Depression   ? Diarrhea 03/23/2019  ? Family history of adverse reaction to anesthesia   ? MOTHER--- HARD TO WAKE  ? History of panic attacks   ? Hyperlipidemia   ? Hypertension   ? Meatal stenosis   ? OSA on CPAP   ? moderate to severe OSA  per study 04-28-2007 uses CPAP  ? PONV (postoperative nausea and vomiting)   ? ?Past Surgical History:  ?Procedure Laterality Date  ? APPENDECTOMY  2003  ? BREATH TEK H PYLORI  04/27/2012  ? Procedure:  BREATH TEK H PYLORI;  Surgeon: Valarie Merino, MD;  Location: [facility];  Service: General;  Laterality: N/A;  ? CHOLECYSTECTOMY N/A 03/24/2019  ? Procedure: LAPAROSCOPIC CHOLECYSTECTOMY WITH INTRAOPERATIVE CHOLANGIOGRAM;  Surgeon: Manus Rudd, MD;  Location: [facility];  Service: General;  Laterality: N/A;  ? CYSTOSCOPY WITH URETHRAL DILATATION N/A 04/12/2015  ? Procedure: CYSTOSCOPY WITH URETHRAL DILATATION, RETROGRADE AND URETHROGRAM WITH BLADDER BIOPSY;  Surgeon: Bjorn Pippin, MD;  Location: [facility];  Service: Urology;  Laterality: N/A;  ? ROTATOR CUFF REPAIR Right 01-21-2012  ? TRANSTHORACIC ECHOCARDIOGRAM  03-26-2007  ? normal LVSF, ef 60%,  mild AV calcification without stenosis,  mild MR and TR,  mild LAE  ? URETHROGRAM N/A 04/12/2015  ? Procedure: URETHROGRAM;  Surgeon: Bjorn Pippin, MD;  Location: [facility];  Service: Urology;  Laterality: N/A;  ? ? reports that he has never smoked. He has never used smokeless tobacco. He reports current alcohol use. He reports that he does not use drugs. ?family history includes Cancer in his father; Coronary artery disease in his father; Diabetes in an other family member; Hyperlipidemia in an other family member; Hypertension in an other family member; Stroke in an other family member. ?Allergies  ?Allergen Reactions  ?  Gabapentin Swelling  ? Crestor [Rosuvastatin Calcium] Other (See Comments)  ?  abd pain  ? ?Current Outpatient Medications on File Prior to Visit  ?Medication Sig Dispense Refill  ? Amino Acids (COMPLETE AMINO ACID MIX PO) Take by mouth.    ? amLODipine (NORVASC) 10 MG tablet Take 1 tablet by mouth once daily 90 tablet 3  ? Cetirizine HCl (KLS ALLER-TEC PO) Take by mouth.    ? Cholecalciferol (D3 5000 PO) Take by mouth.    ? Coenzyme Q10 (COQ10) 100 MG CAPS Take 300 mg by mouth.    ? cyclobenzaprine (FLEXERIL) 5 MG tablet Take 1-2 tablets (5-10 mg total) by mouth 3 (three) times daily as needed for muscle spasms. 90 tablet  5  ? doxazosin (CARDURA) 4 MG tablet TAKE 1 TABLET BY MOUTH AT BEDTIME 90 tablet 3  ? DULoxetine (CYMBALTA) 60 MG capsule Take 1 capsule by mouth once daily in the morning 90 capsule 3  ? ezetimibe (ZETIA) 10 MG tablet Take 1 tablet by mouth once daily 90 tablet 3  ? fluticasone (FLONASE) 50 MCG/ACT nasal spray Use 2 spray(s) in each nostril once daily 16 g 0  ? Ginger, Zingiber officinalis, (GINGER ROOT) 550 MG CAPS Take by mouth.    ? LORazepam (ATIVAN) 1 MG tablet Take 1 tablet by mouth twice daily as needed for anxiety 60 tablet 2  ? MILK THISTLE PO Milk Thistle    ? Omega 3-6-9 Fatty Acids (TRIPLE OMEGA COMPLEX PO) Take by mouth.    ? oxyCODONE-acetaminophen (PERCOCET/ROXICET) 5-325 MG tablet Take 1 tablet by mouth daily as needed for severe pain. 60 tablet 0  ? pravastatin (PRAVACHOL) 80 MG tablet Take 1 tablet by mouth once daily 90 tablet 3  ? Saw Palmetto 1000 MG CAPS Take by mouth.    ? Sodium Hyaluronate, oral, (HYALURONIC ACID) 100 MG CAPS Take 200 mg by mouth.    ? tamsulosin (FLOMAX) 0.4 MG CAPS capsule Take 1 capsule by mouth once daily 90 capsule 3  ? Turmeric 500 MG CAPS Take by mouth.    ? testosterone (ANDROGEL) 50 MG/5GM (1%) GEL Place 5 g onto the skin daily. (Patient not taking: Reported on 01/07/2022) 450 g 1  ? ?No current facility-administered medications on file prior to visit.  ? ?     ROS:  All others reviewed and negative. ? ?Objective  ? ?     PE:  BP 122/80 (BP Location: Left Arm, Patient Position: Sitting, Cuff Size: Large)   Pulse 73   Temp 98.3 ?F (36.8 ?C) (Oral)   Ht 5\' 7"  (1.702 m)   Wt 263 lb (119.3 kg)   SpO2 98%   BMI 41.19 kg/m?  ? ?              Constitutional: Pt appears in NAD ?              HENT: Head: NCAT.  ?              Right Ear: External ear normal.   ?              Left Ear: External ear normal.  ?              Eyes: . Pupils are equal, round, and reactive to light. Conjunctivae and EOM are normal ?              Nose: without d/c or deformity ?  Neck: Neck supple. Gross normal ROM ?              Cardiovascular: Normal rate and regular rhythm.   ?              Pulmonary/Chest: Effort normal and breath sounds without rales or wheezing.  ?              Abd:  Soft, NT, ND, + BS, no organomegaly ?              Neurological: Pt is alert. At baseline orientation, motor grossly intact ?              Skin: Skin is warm. No rashes, no other new lesions, LE edema - none ?              Psychiatric: Pt behavior is normal without agitation  ? ?Micro: none ? ?Cardiac tracings I have personally interpreted today:  none ? ?Pertinent Radiological findings (summarize): none  ? ?Lab Results  ?Component Value Date  ? WBC 6.3 01/03/2022  ? HGB 13.9 01/03/2022  ? HCT 41.7 01/03/2022  ? PLT 179.0 01/03/2022  ? GLUCOSE 96 01/03/2022  ? CHOL 140 01/03/2022  ? TRIG 71.0 01/03/2022  ? HDL 48.10 01/03/2022  ? LDLDIRECT 139.0 01/02/2016  ? LDLCALC 78 01/03/2022  ? ALT 28 01/03/2022  ? AST 24 01/03/2022  ? NA 139 01/03/2022  ? K 4.0 01/03/2022  ? CL 102 01/03/2022  ? CREATININE 1.41 01/03/2022  ? BUN 19 01/03/2022  ? CO2 28 01/03/2022  ? TSH 1.74 01/03/2022  ? PSA 1.07 01/03/2022  ? HGBA1C 5.4 01/03/2022  ? MICROALBUR 1.2 07/05/2021  ? ?Assessment/Plan:  ?Trashaun Streight is a 56 y.o. White or Caucasian [1] male with  has a past medical history of Anxiety, BPH (benign prostatic hypertrophy), Chronic lower back pain, Depression, Diarrhea (03/23/2019), Family history of adverse reaction to anesthesia, History of panic attacks, Hyperlipidemia, Hypertension, Meatal stenosis, OSA on CPAP, and PONV (postoperative nausea and vomiting). ? ?Encounter for well adult exam with abnormal findings ?Age and sex appropriate education and counseling updated with regular exercise and diet ?Referrals for preventative services - none needed ?Immunizations addressed - declines covid booster, shingrix  ?Smoking counseling  - none needed ?Evidence for depression or other mood disorder - none significant ?Most  recent labs reviewed. ?I have personally reviewed and have noted: ?1) the patient's medical and social history ?2) The patient's current medications and supplements ?3) The patient's height, weight, and BMI have be

## 2022-01-11 NOTE — Assessment & Plan Note (Signed)
Uncontrolled, for testosterone restart, recheck lab next visit ?

## 2022-01-11 NOTE — Assessment & Plan Note (Signed)

## 2022-01-11 NOTE — Assessment & Plan Note (Signed)
Lab Results  ?Component Value Date  ? HGBA1C 5.4 01/03/2022  ? ?Stable, pt to continue current medical treatment  - diet ? ?

## 2022-01-11 NOTE — Assessment & Plan Note (Signed)
BP Readings from Last 3 Encounters:  ?01/07/22 122/80  ?10/30/21 120/67  ?07/29/21 (!) 137/92  ? ?Stable, pt to continue medical treatment norvasc, lotensin, hct ? ?

## 2022-01-22 ENCOUNTER — Ambulatory Visit (INDEPENDENT_AMBULATORY_CARE_PROVIDER_SITE_OTHER): Payer: Medicare HMO

## 2022-01-22 ENCOUNTER — Telehealth: Payer: Self-pay | Admitting: *Deleted

## 2022-01-22 DIAGNOSIS — Z0000 Encounter for general adult medical examination without abnormal findings: Secondary | ICD-10-CM

## 2022-01-22 MED ORDER — OXYCODONE-ACETAMINOPHEN 5-325 MG PO TABS: 1.0000 | ORAL_TABLET | Freq: Every day | ORAL | 0 refills | Status: DC | PRN

## 2022-01-22 NOTE — Progress Notes (Signed)
I connected with Darren Black today by telephone and verified that I am speaking with the correct person using two identifiers. Location patient: home Location provider: work Persons participating in the virtual visit: patient, provider.   I discussed the limitations, risks, security and privacy concerns of performing an evaluation and management service by telephone and the availability of in person appointments. I also discussed with the patient that there may be a patient responsible charge related to this service. The patient expressed understanding and verbally consented to this telephonic visit.    Interactive audio and video telecommunications were attempted between this provider and patient, however failed, due to patient having technical difficulties OR patient did not have access to video capability.  We continued and completed visit with audio only.  Some vital signs may be absent or patient reported.   Time Spent with patient on telephone encounter: 30 minutes  Subjective:   Darren Black is a 56 y.o. male who presents for Medicare Annual/Subsequent preventive examination.  Review of Systems     Cardiac Risk Factors include: dyslipidemia;family history of premature cardiovascular disease;hypertension;male gender;obesity (BMI >30kg/m2);sedentary lifestyle     Objective:    Today's Vitals   01/22/22 1317  PainSc: 7    There is no height or weight on file to calculate BMI.     01/22/2022    1:22 PM 08/07/2020    2:39 PM 03/23/2019    9:44 AM 03/22/2019   11:06 PM 02/05/2019   11:27 AM 04/12/2015    6:14 AM 12/05/2014    2:55 PM  Advanced Directives  Does Patient Have a Medical Advance Directive? No No  No No No No  Would patient like information on creating a medical advance directive? No - Patient declined No - Patient declined No - Patient declined No - Patient declined  No - patient declined information     Current Medications (verified) Outpatient Encounter  Medications as of 01/22/2022  Medication Sig   Amino Acids (COMPLETE AMINO ACID MIX PO) Take by mouth.   amLODipine (NORVASC) 10 MG tablet Take 1 tablet by mouth once daily   benazepril (LOTENSIN) 40 MG tablet Take 1 tablet (40 mg total) by mouth daily.   Cetirizine HCl (KLS ALLER-TEC PO) Take by mouth.   Cholecalciferol (D3 5000 PO) Take by mouth.   Coenzyme Q10 (COQ10) 100 MG CAPS Take 300 mg by mouth.   cyclobenzaprine (FLEXERIL) 5 MG tablet Take 1-2 tablets (5-10 mg total) by mouth 3 (three) times daily as needed for muscle spasms.   doxazosin (CARDURA) 4 MG tablet TAKE 1 TABLET BY MOUTH AT BEDTIME   DULoxetine (CYMBALTA) 60 MG capsule Take 1 capsule by mouth once daily in the morning   ezetimibe (ZETIA) 10 MG tablet Take 1 tablet by mouth once daily   fluticasone (FLONASE) 50 MCG/ACT nasal spray Use 2 spray(s) in each nostril once daily   Ginger, Zingiber officinalis, (GINGER ROOT) 550 MG CAPS Take by mouth.   hydrochlorothiazide (HYDRODIURIL) 25 MG tablet Take 1/2 (one-half) tablet by mouth once daily   LORazepam (ATIVAN) 1 MG tablet Take 1 tablet by mouth twice daily as needed for anxiety   MILK THISTLE PO Milk Thistle   Omega 3-6-9 Fatty Acids (TRIPLE OMEGA COMPLEX PO) Take by mouth.   oxyCODONE-acetaminophen (PERCOCET/ROXICET) 5-325 MG tablet Take 1 tablet by mouth daily as needed for severe pain.   potassium chloride (KLOR-CON) 10 MEQ tablet Take 1 tablet (10 mEq total) by mouth daily.   pravastatin (PRAVACHOL)  80 MG tablet Take 1 tablet by mouth once daily   Saw Palmetto 1000 MG CAPS Take by mouth.   Sodium Hyaluronate, oral, (HYALURONIC ACID) 100 MG CAPS Take 200 mg by mouth.   tamsulosin (FLOMAX) 0.4 MG CAPS capsule Take 1 capsule by mouth once daily   testosterone (ANDROGEL) 50 MG/5GM (1%) GEL Place 5 g onto the skin daily. (Patient not taking: Reported on 01/07/2022)   Turmeric 500 MG CAPS Take by mouth.   No facility-administered encounter medications on file as of 01/22/2022.     Allergies (verified) Gabapentin and Crestor [rosuvastatin calcium]   History: Past Medical History:  Diagnosis Date   Anxiety    BPH (benign prostatic hypertrophy)    Chronic lower back pain    nerve damage w/ leg pain   Depression    Diarrhea 03/23/2019   Family history of adverse reaction to anesthesia    MOTHER--- HARD TO WAKE   History of panic attacks    Hyperlipidemia    Hypertension    Meatal stenosis    OSA on CPAP    moderate to severe OSA  per study 04-28-2007 uses CPAP   PONV (postoperative nausea and vomiting)    Past Surgical History:  Procedure Laterality Date   APPENDECTOMY  2003   BREATH TEK H PYLORI  04/27/2012   Procedure: BREATH TEK H PYLORI;  Surgeon: Pedro Earls, MD;  Location: [facility];  Service: General;  Laterality: N/A;   CHOLECYSTECTOMY N/A 03/24/2019   Procedure: LAPAROSCOPIC CHOLECYSTECTOMY WITH INTRAOPERATIVE CHOLANGIOGRAM;  Surgeon: Donnie Mesa, MD;  Location: [facility];  Service: General;  Laterality: N/A;   CYSTOSCOPY WITH URETHRAL DILATATION N/A 04/12/2015   Procedure: CYSTOSCOPY WITH URETHRAL DILATATION, RETROGRADE AND URETHROGRAM WITH BLADDER BIOPSY;  Surgeon: Irine Seal, MD;  Location: [facility];  Service: Urology;  Laterality: N/A;   ROTATOR CUFF REPAIR Right 01-21-2012   TRANSTHORACIC ECHOCARDIOGRAM  03-26-2007   normal LVSF, ef 60%,  mild AV calcification without stenosis,  mild MR and TR,  mild LAE   URETHROGRAM N/A 04/12/2015   Procedure: URETHROGRAM;  Surgeon: Irine Seal, MD;  Location: [facility];  Service: Urology;  Laterality: N/A;   Family History  Problem Relation Age of Onset   Coronary artery disease Father    Cancer Father        anaplastic thyroid cancer   Stroke Other        1st degree relative   Diabetes Other        1st degree relative   Hypertension Other    Hyperlipidemia Other    Social History   Socioeconomic History   Marital status: Married    Spouse name: Not on  file   Number of children: Not on file   Years of education: Not on file   Highest education level: Not on file  Occupational History   Occupation: disabled, LBP and anxiety  Tobacco Use   Smoking status: Never   Smokeless tobacco: Never  Vaping Use   Vaping Use: Never used  Substance and Sexual Activity   Alcohol use: Yes    Comment: rare   Drug use: No   Sexual activity: Not on file  Other Topics Concern   Not on file  Social History Narrative   Not on file   Social Determinants of Health   Financial Resource Strain: Low Risk    Difficulty of Paying Living Expenses: Not hard at all  Food Insecurity: No Food Insecurity  Worried About Charity fundraiser in the Last Year: Never true   [place] in the Last Year: Never true  Transportation Needs: No Transportation Needs   Lack of Transportation (Medical): No   Lack of Transportation (Non-Medical): No  Physical Activity: Inactive   Days of Exercise per Week: 0 days   Minutes of Exercise per Session: 0 min  Stress: No Stress Concern Present   Feeling of Stress : Not at all  Social Connections: Moderately Isolated   Frequency of Communication with Friends and Family: More than three times a week   Frequency of Social Gatherings with Friends and Family: Once a week   Attends Religious Services: Never   Marine scientist or Organizations: No   Attends Music therapist: Never   Marital Status: Married    Tobacco Counseling Counseling given: Not Answered   Clinical Intake:  Pre-visit preparation completed: Yes  Pain : 0-10 Pain Score: 7  Pain Type: Chronic pain Pain Location: Back Pain Orientation: Lower Pain Radiating Towards: Bilateral Lower Extremities Pain Descriptors / Indicators: Constant, Discomfort Pain Onset: More than a month ago Pain Frequency: Constant Pain Relieving Factors: Oxycodone  Pain Relieving Factors: Oxycodone  BMI - recorded: 41.19 Nutritional Risks:  None Diabetes: No  How often do you need to have someone help you when you read instructions, pamphlets, or other written materials from your doctor or pharmacy?: 1 - Never What is the last grade level you completed in school?: 8th grade  Diabetic? no  Interpreter Needed?: No  Information entered by :: Lisette Abu, LPN.   Activities of Daily Living    01/22/2022    1:31 PM  In your present state of health, do you have any difficulty performing the following activities:  Hearing? 0  Vision? 0  Difficulty concentrating or making decisions? 1  Walking or climbing stairs? 0  Dressing or bathing? 0  Doing errands, shopping? 1  Comment patient does not drive  Preparing Food and eating ? N  Using the Toilet? N  In the past six months, have you accidently leaked urine? N  Do you have problems with loss of bowel control? N  Managing your Medications? N  Managing your Finances? N  Housekeeping or managing your Housekeeping? Y    Patient Care Team: Biagio Borg, MD as PCP - General Christain Sacramento, OD as Referring Physician (Optometry)  Indicate any recent Medical Services you may have received from other than [facility] providers in the past year (date may be approximate).     Assessment:   This is a routine wellness examination for [place].  Hearing/Vision screen No results found.  Dietary issues and exercise activities discussed: Current Exercise Habits: The patient does not participate in regular exercise at present, Exercise limited by: neurologic condition(s)   Goals Addressed   None   Depression Screen    01/22/2022    1:30 PM 01/07/2022    3:43 PM 01/07/2022    2:51 PM 10/30/2021   10:26 AM 07/29/2021   11:50 AM 03/22/2021    1:23 PM 01/01/2021    2:30 PM  PHQ 2/9 Scores  PHQ - 2 Score 2 0 2 0 0 6 0  PHQ- 9 Score 7  9   23      Fall Risk    01/07/2022    3:43 PM 01/07/2022    2:51 PM 10/30/2021   10:26 AM 07/29/2021   11:50 AM 03/22/2021    1:22 PM  Fall Risk   Falls  in the past year? 0 0 0 0 0  Number falls in past yr: 0 0 0    Injury with Fall? 0 0 0      FALL RISK PREVENTION PERTAINING TO THE HOME:  Any stairs in or around the home? Yes  If so, are there any without handrails? No  Home free of loose throw rugs in walkways, pet beds, electrical cords, etc? Yes  Adequate lighting in your home to reduce risk of falls? Yes   ASSISTIVE DEVICES UTILIZED TO PREVENT FALLS:  Life alert? No  Use of a cane, walker or w/c? No  Grab bars in the bathroom? Yes  Shower chair or bench in shower? Yes  Elevated toilet seat or a handicapped toilet? No   TIMED UP AND GO:  Was the test performed? No .  Length of time to ambulate 10 feet: n/a sec.   Appearance of gait: Patient not evaluated for gait during this visit.  Cognitive Function:        01/22/2022    1:32 PM  6CIT Screen  What Year? 0 points  What month? 0 points  What time? 0 points  Count back from 20 0 points  Months in reverse 0 points  Repeat phrase 0 points  Total Score 0 points    Immunizations Immunization History  Administered Date(s) Administered   Influenza Split 08/28/2011   Influenza,inj,Quad PF,6+ Mos 07/28/2017, 07/12/2018, 05/05/2019   Influenza-Unspecified 07/20/2020   PFIZER(Purple Top)SARS-COV-2 Vaccination 10/13/2019, 11/07/2019, 05/28/2020, 01/14/2021   Tdap 01/21/2017    TDAP status: Up to date  Flu Vaccine status: Due, Education has been provided regarding the importance of this vaccine. Advised may receive this vaccine at local pharmacy or Health Dept. Aware to provide a copy of the vaccination record if obtained from local pharmacy or Health Dept. Verbalized acceptance and understanding.  Pneumococcal vaccine status: Declined,  Education has been provided regarding the importance of this vaccine but patient still declined. Advised may receive this vaccine at local pharmacy or Health Dept. Aware to provide a copy of the vaccination record if obtained from local  pharmacy or Health Dept. Verbalized acceptance and understanding.   Covid-19 vaccine status: Completed vaccines  Qualifies for Shingles Vaccine? Yes   Zostavax completed No   Shingrix Completed?: No.    Education has been provided regarding the importance of this vaccine. Patient has been advised to call insurance company to determine out of pocket expense if they have not yet received this vaccine. Advised may also receive vaccine at local pharmacy or Health Dept. Verbalized acceptance and understanding.  Screening Tests Health Maintenance  Topic Date Due   COVID-19 Vaccine (5 - Booster for Pfizer series) 02/28/2022 (Originally 03/11/2021)   Zoster Vaccines- Shingrix (1 of 2) 04/09/2022 (Originally 02/28/1986)   INFLUENZA VACCINE  04/01/2022   Fecal DNA (Cologuard)  08/06/2024   TETANUS/TDAP  01/22/2027   Hepatitis C Screening  Completed   HIV Screening  Completed   HPV VACCINES  Aged Out    Health Maintenance  There are no preventive care reminders to display for this patient.  Colorectal cancer screening: Type of screening: Cologuard. Completed 08/10/2021. Repeat every 3 years  Lung Cancer Screening: (Low Dose CT Chest recommended if Age 28-80 years, 30 pack-year currently smoking OR have quit w/in 15years.) does not qualify.   Lung Cancer Screening Referral: no  Additional Screening:  Hepatitis C Screening: does qualify; Completed 01/01/2021  Vision Screening: Recommended annual ophthalmology  exams for early detection of glaucoma and other disorders of the eye. Is the patient up to date with their annual eye exam?  Yes  Who is the provider or what is the name of the office in which the patient attends annual eye exams? Staci Palmer, OD. If pt is not established with a provider, would they like to be referred to a provider to establish care? No .   Dental Screening: Recommended annual dental exams for proper oral hygiene  Community Resource Referral / Chronic Care  Management: CRR required this visit?  No   CCM required this visit?  No      Plan:     I have personally reviewed and noted the following in the patient's chart:   Medical and social history Use of alcohol, tobacco or illicit drugs  Current medications and supplements including opioid prescriptions. Patient is currently taking opioid prescriptions. Information provided to patient regarding non-opioid alternatives. Patient advised to discuss non-opioid treatment plan with their provider. Functional ability and status Nutritional status Physical activity Advanced directives List of other physicians Hospitalizations, surgeries, and ER visits in previous 12 months Vitals Screenings to include cognitive, depression, and falls Referrals and appointments  In addition, I have reviewed and discussed with patient certain preventive protocols, quality metrics, and best practice recommendations. A written personalized care plan for preventive services as well as general preventive health recommendations were provided to patient.     Sheral Flow, LPN   [id number]   Nurse Notes:  There were no vitals filed for this visit. There is no height or weight on file to calculate BMI.

## 2022-01-22 NOTE — Telephone Encounter (Signed)
Darren Black called for a refill on Darren Black's oxycodone acetaminophen 5-325. Per PMP his last fill date was 11/26/21. Next appt is 02/21/22.

## 2022-01-22 NOTE — Patient Instructions (Signed)
Darren Black , Thank you for taking time to come for your Medicare Wellness Visit. I appreciate your ongoing commitment to your health goals. Please review the following plan we discussed and let me know if I can assist you in the future.   Screening recommendations/referrals: Cologuard: 08/10/2021; due every 3 years Recommended yearly ophthalmology/optometry visit for glaucoma screening and checkup Recommended yearly dental visit for hygiene and checkup  Vaccinations: Influenza vaccine: Due Fall 2023 Pneumococcal vaccine: declined Tdap vaccine: 01/21/2017; due every 10 years Shingles vaccine: declined   Covid-19: 10/12/2199, 11/07/2019, 9/27/221, 01/14/2021  Advanced directives: No  Conditions/risks identified: Yes  Next appointment: Please schedule your next Medicare Wellness Visit with your Nurse Health Advisor in 1 year by calling [phone].  Preventive Care 40-64 Years, Male Preventive care refers to lifestyle choices and visits with your health care provider that can promote health and wellness. What does preventive care include? A yearly physical exam. This is also called an annual well check. Dental exams once or twice a year. Routine eye exams. Ask your health care provider how often you should have your eyes checked. Personal lifestyle choices, including: Daily care of your teeth and gums. Regular physical activity. Eating a healthy diet. Avoiding tobacco and drug use. Limiting alcohol use. Practicing safe sex. Taking low-dose aspirin every day starting at age 58. What happens during an annual well check? The services and screenings done by your health care provider during your annual well check will depend on your age, overall health, lifestyle risk factors, and family history of disease. Counseling  Your health care provider may ask you questions about your: Alcohol use. Tobacco use. Drug use. Emotional well-being. Home and relationship well-being. Sexual  activity. Eating habits. Work and work Astronomer. Screening  You may have the following tests or measurements: Height, weight, and BMI. Blood pressure. Lipid and cholesterol levels. These may be checked every 5 years, or more frequently if you are over 42 years old. Skin check. Lung cancer screening. You may have this screening every year starting at age 91 if you have a 30-pack-year history of smoking and currently smoke or have quit within the past 15 years. Fecal occult blood test (FOBT) of the stool. You may have this test every year starting at age 5. Flexible sigmoidoscopy or colonoscopy. You may have a sigmoidoscopy every 5 years or a colonoscopy every 10 years starting at age 68. Prostate cancer screening. Recommendations will vary depending on your family history and other risks. Hepatitis C blood test. Hepatitis B blood test. Sexually transmitted disease (STD) testing. Diabetes screening. This is done by checking your blood sugar (glucose) after you have not eaten for a while (fasting). You may have this done every 1-3 years. Discuss your test results, treatment options, and if necessary, the need for more tests with your health care provider. Vaccines  Your health care provider may recommend certain vaccines, such as: Influenza vaccine. This is recommended every year. Tetanus, diphtheria, and acellular pertussis (Tdap, Td) vaccine. You may need a Td booster every 10 years. Zoster vaccine. You may need this after age 93. Pneumococcal 13-valent conjugate (PCV13) vaccine. You may need this if you have certain conditions and have not been vaccinated. Pneumococcal polysaccharide (PPSV23) vaccine. You may need one or two doses if you smoke cigarettes or if you have certain conditions. Talk to your health care provider about which screenings and vaccines you need and how often you need them. This information is not intended to replace advice given to  you by your health care provider.  Make sure you discuss any questions you have with your health care provider. Document Released: 09/14/2015 Document Revised: 05/07/2016 Document Reviewed: 06/19/2015 Elsevier Interactive Patient Education  2017 [place] Prevention in the Home Falls can cause injuries. They can happen to people of all ages. There are many things you can do to make your home safe and to help prevent falls. What can I do on the outside of my home? Regularly fix the edges of walkways and driveways and fix any cracks. Remove anything that might make you trip as you walk through a door, such as a raised step or threshold. Trim any bushes or trees on the path to your home. Use bright outdoor lighting. Clear any walking paths of anything that might make someone trip, such as rocks or tools. Regularly check to see if handrails are loose or broken. Make sure that both sides of any steps have handrails. Any raised decks and porches should have guardrails on the edges. Have any leaves, snow, or ice cleared regularly. Use sand or salt on walking paths during winter. Clean up any spills in your garage right away. This includes oil or grease spills. What can I do in the bathroom? Use night lights. Install grab bars by the toilet and in the tub and shower. Do not use towel bars as grab bars. Use non-skid mats or decals in the tub or shower. If you need to sit down in the shower, use a plastic, non-slip stool. Keep the floor dry. Clean up any water that spills on the floor as soon as it happens. Remove soap buildup in the tub or shower regularly. Attach bath mats securely with double-sided non-slip rug tape. Do not have throw rugs and other things on the floor that can make you trip. What can I do in the bedroom? Use night lights. Make sure that you have a light by your bed that is easy to reach. Do not use any sheets or blankets that are too big for your bed. They should not hang down onto the floor. Have a  firm chair that has side arms. You can use this for support while you get dressed. Do not have throw rugs and other things on the floor that can make you trip. What can I do in the kitchen? Clean up any spills right away. Avoid walking on wet floors. Keep items that you use a lot in easy-to-reach places. If you need to reach something above you, use a strong step stool that has a grab bar. Keep electrical cords out of the way. Do not use floor polish or wax that makes floors slippery. If you must use wax, use non-skid floor wax. Do not have throw rugs and other things on the floor that can make you trip. What can I do with my stairs? Do not leave any items on the stairs. Make sure that there are handrails on both sides of the stairs and use them. Fix handrails that are broken or loose. Make sure that handrails are as long as the stairways. Check any carpeting to make sure that it is firmly attached to the stairs. Fix any carpet that is loose or worn. Avoid having throw rugs at the top or bottom of the stairs. If you do have throw rugs, attach them to the floor with carpet tape. Make sure that you have a light switch at the top of the stairs and the bottom of the stairs.  If you do not have them, ask someone to add them for you. What else can I do to help prevent falls? Wear shoes that: Do not have high heels. Have rubber bottoms. Are comfortable and fit you well. Are closed at the toe. Do not wear sandals. If you use a stepladder: Make sure that it is fully opened. Do not climb a closed stepladder. Make sure that both sides of the stepladder are locked into place. Ask someone to hold it for you, if possible. Clearly mark and make sure that you can see: Any grab bars or handrails. First and last steps. Where the edge of each step is. Use tools that help you move around (mobility aids) if they are needed. These include: Canes. Walkers. Scooters. Crutches. Turn on the lights when you  go into a dark area. Replace any light bulbs as soon as they burn out. Set up your furniture so you have a clear path. Avoid moving your furniture around. If any of your floors are uneven, fix them. If there are any pets around you, be aware of where they are. Review your medicines with your doctor. Some medicines can make you feel dizzy. This can increase your chance of falling. Ask your doctor what other things that you can do to help prevent falls. This information is not intended to replace advice given to you by your health care provider. Make sure you discuss any questions you have with your health care provider. Document Released: 06/14/2009 Document Revised: 01/24/2016 Document Reviewed: 09/22/2014 Elsevier Interactive Patient Education  2017 [employer].

## 2022-02-21 ENCOUNTER — Encounter: Payer: Self-pay | Admitting: Physical Medicine and Rehabilitation

## 2022-02-21 ENCOUNTER — Encounter: Payer: Medicare HMO | Attending: Physical Medicine and Rehabilitation | Admitting: Physical Medicine and Rehabilitation

## 2022-02-21 VITALS — BP 119/81 | HR 68 | Ht 67.0 in | Wt 274.8 lb

## 2022-02-21 DIAGNOSIS — Z79891 Long term (current) use of opiate analgesic: Secondary | ICD-10-CM | POA: Diagnosis not present

## 2022-02-21 DIAGNOSIS — M4800 Spinal stenosis, site unspecified: Secondary | ICD-10-CM | POA: Diagnosis not present

## 2022-02-21 DIAGNOSIS — G894 Chronic pain syndrome: Secondary | ICD-10-CM | POA: Insufficient documentation

## 2022-02-21 DIAGNOSIS — M5416 Radiculopathy, lumbar region: Secondary | ICD-10-CM | POA: Insufficient documentation

## 2022-02-21 DIAGNOSIS — Z5181 Encounter for therapeutic drug level monitoring: Secondary | ICD-10-CM | POA: Insufficient documentation

## 2022-02-21 MED ORDER — OXYCODONE-ACETAMINOPHEN 5-325 MG PO TABS
1.0000 | ORAL_TABLET | Freq: Every day | ORAL | 0 refills | Status: DC | PRN
Start: 2022-02-21 — End: 2022-05-14

## 2022-02-21 NOTE — Progress Notes (Signed)
Subjective:    Patient ID: Darren Black, male    DOB: 07-24-1966, 56 y.o.   MRN: [id number]  HPI   Pt is a 56 yr old male with hx of R shoulder pain due to complete supraspinatus tear HTN; no DM; HLD, BPH, on Flomax and Prazosin; and allergies and low testosterone, depression/anxiety on Lorazepam/Cymbalta; ;  Here for f/u of chronic low back pain.    Changed flexeril to 5 mg at last visit- usually takes 5 mg 1x/day usually. And going OK.     Cr back down to 1.41 from 1.72.  Doing much better since drinking more water.    Just came back from [place] on Monday- still recovering.  Went to go see daughter.     Pain Inventory Average Pain 8 Pain Right Now 8 My pain is sharp, burning, dull, stabbing, and aching  In the last 24 hours, has pain interfered with the following? General activity 8 Relation with others 8 Enjoyment of life 8 What TIME of day is your pain at its worst? morning , daytime, evening, and night Sleep (in general) Fair  Pain is worse with: walking, bending, sitting, and standing Pain improves with: medication Relief from Meds: 5  Family History  Problem Relation Age of Onset   Coronary artery disease Father    Cancer Father        anaplastic thyroid cancer   Stroke Other        1st degree relative   Diabetes Other        1st degree relative   Hypertension Other    Hyperlipidemia Other    Social History   Socioeconomic History   Marital status: Married    Spouse name: Not on file   Number of children: Not on file   Years of education: Not on file   Highest education level: Not on file  Occupational History   Occupation: disabled, LBP and anxiety  Tobacco Use   Smoking status: Never   Smokeless tobacco: Never  Vaping Use   Vaping Use: Never used  Substance and Sexual Activity   Alcohol use: Yes    Comment: rare   Drug use: No   Sexual activity: Not on file  Other Topics Concern   Not on file  Social History Narrative   Not on file    Social Determinants of Health   Financial Resource Strain: Low Risk  (01/22/2022)   Overall Financial Resource Strain (CARDIA)    Difficulty of Paying Living Expenses: Not hard at all  Food Insecurity: No Food Insecurity (01/22/2022)   Hunger Vital Sign    Worried About Running Out of Food in the Last Year: Never true    Ran Out of Food in the Last Year: Never true  Transportation Needs: No Transportation Needs (01/22/2022)   PRAPARE - Administrator, Civil Service (Medical): No    Lack of Transportation (Non-Medical): No  Physical Activity: Inactive (01/22/2022)   Exercise Vital Sign    Days of Exercise per Week: 0 days    Minutes of Exercise per Session: 0 min  Stress: No Stress Concern Present (01/22/2022)   [employer] of Occupational Health - Occupational Stress Questionnaire    Feeling of Stress : Not at all  Social Connections: Moderately Isolated (01/22/2022)   Social Connection and Isolation Panel [NHANES]    Frequency of Communication with Friends and Family: More than three times a week    Frequency of Social Gatherings with Friends  and Family: Once a week    Attends Religious Services: Never    Active Member of Clubs or Organizations: No    Attends Banker Meetings: Never    Marital Status: Married   Past Surgical History:  Procedure Laterality Date   APPENDECTOMY  2003   BREATH TEK H PYLORI  04/27/2012   Procedure: BREATH TEK H PYLORI;  Surgeon: Valarie Merino, MD;  Location: [facility];  Service: General;  Laterality: N/A;   CHOLECYSTECTOMY N/A 03/24/2019   Procedure: LAPAROSCOPIC CHOLECYSTECTOMY WITH INTRAOPERATIVE CHOLANGIOGRAM;  Surgeon: Manus Rudd, MD;  Location: [facility];  Service: General;  Laterality: N/A;   CYSTOSCOPY WITH URETHRAL DILATATION N/A 04/12/2015   Procedure: CYSTOSCOPY WITH URETHRAL DILATATION, RETROGRADE AND URETHROGRAM WITH BLADDER BIOPSY;  Surgeon: Bjorn Pippin, MD;  Location: [facility];  Service:  Urology;  Laterality: N/A;   ROTATOR CUFF REPAIR Right 01-21-2012   TRANSTHORACIC ECHOCARDIOGRAM  03-26-2007   normal LVSF, ef 60%,  mild AV calcification without stenosis,  mild MR and TR,  mild LAE   URETHROGRAM N/A 04/12/2015   Procedure: URETHROGRAM;  Surgeon: Bjorn Pippin, MD;  Location: [facility];  Service: Urology;  Laterality: N/A;   Past Surgical History:  Procedure Laterality Date   APPENDECTOMY  2003   BREATH TEK H PYLORI  04/27/2012   Procedure: BREATH TEK H PYLORI;  Surgeon: Valarie Merino, MD;  Location: [facility];  Service: General;  Laterality: N/A;   CHOLECYSTECTOMY N/A 03/24/2019   Procedure: LAPAROSCOPIC CHOLECYSTECTOMY WITH INTRAOPERATIVE CHOLANGIOGRAM;  Surgeon: Manus Rudd, MD;  Location: [facility];  Service: General;  Laterality: N/A;   CYSTOSCOPY WITH URETHRAL DILATATION N/A 04/12/2015   Procedure: CYSTOSCOPY WITH URETHRAL DILATATION, RETROGRADE AND URETHROGRAM WITH BLADDER BIOPSY;  Surgeon: Bjorn Pippin, MD;  Location: [facility];  Service: Urology;  Laterality: N/A;   ROTATOR CUFF REPAIR Right 01-21-2012   TRANSTHORACIC ECHOCARDIOGRAM  03-26-2007   normal LVSF, ef 60%,  mild AV calcification without stenosis,  mild MR and TR,  mild LAE   URETHROGRAM N/A 04/12/2015   Procedure: URETHROGRAM;  Surgeon: Bjorn Pippin, MD;  Location: [facility];  Service: Urology;  Laterality: N/A;   Past Medical History:  Diagnosis Date   Anxiety    BPH (benign prostatic hypertrophy)    Chronic lower back pain    nerve damage w/ leg pain   Depression    Diarrhea 03/23/2019   Family history of adverse reaction to anesthesia    MOTHER--- HARD TO WAKE   History of panic attacks    Hyperlipidemia    Hypertension    Meatal stenosis    OSA on CPAP    moderate to severe OSA  per study 04-28-2007 uses CPAP   PONV (postoperative nausea and vomiting)    BP 119/81   Pulse 68   Ht 5\' 7"  (1.702 m)   Wt 274 lb 12.8 oz (124.6 kg)   SpO2 94%    BMI 43.04 kg/m   Opioid Risk Score:   Fall Risk Score:  `1  Depression screen [facility] 2/9     02/21/2022   11:09 AM 01/22/2022    1:30 PM 01/07/2022    3:43 PM 01/07/2022    2:51 PM 10/30/2021   10:26 AM 07/29/2021   11:50 AM 03/22/2021    1:23 PM  Depression screen PHQ 2/9  Decreased Interest 1 1 0 1 0 0 3  Down, Depressed, Hopeless 1 1 0 1 0 0  3  PHQ - 2 Score 2 2 0 2 0 0 6  Altered sleeping  0  1   3  Tired, decreased energy  3  3   3   Change in appetite  0  0   3  Feeling bad or failure about yourself   0  1   3  Trouble concentrating  2  2   3   Moving slowly or fidgety/restless  0  0   2  Suicidal thoughts  0  0   0  PHQ-9 Score  7  9   23   Difficult doing work/chores  Not difficult at all     Somewhat difficult     Review of Systems  Musculoskeletal:  Positive for back pain and neck pain.  All other systems reviewed and are negative.     Objective:   Physical Exam Awake, alert, appropriate, appears tired from jet lag; accompanied by wife, NAD TTP in band across low back - midline and sides       Assessment & Plan:     Pt is a 56 yr old male with hx of R shoulder pain due to complete supraspinatus tear HTN; no DM; HLD, BPH, on Flomax and Prazosin; and allergies and low testosterone, depression/anxiety on Lorazepam/Cymbalta; ;  Here for f/u of chronic low back pain.   Will con't Flexeril at 5 mg - doesn't need refills  2. Con't Percocet 5/325 mg #60- last filled 01/22/22- is due, but doesn't need right now- will send in, and so next refill will need to call pharmacy, not me for refill.    3. I like tempurpedic  back support for car/plane travel. Helps back pain with travel. Also has a buttock cushion.    4. Con't Cymbalta- gets from Dr Jonny Ruiz.    5. F/U in 3 months.   6. Can do note to get you a power scooter if goes to [employer].    7. UDS today per clinic policy

## 2022-02-27 ENCOUNTER — Telehealth: Payer: Self-pay | Admitting: *Deleted

## 2022-02-27 ENCOUNTER — Other Ambulatory Visit: Payer: Self-pay | Admitting: Internal Medicine

## 2022-02-27 LAB — TOXASSURE SELECT,+ANTIDEPR,UR

## 2022-02-27 NOTE — Telephone Encounter (Signed)
Urine drug screen for this encounter is consistent for prescribed medication 

## 2022-03-03 ENCOUNTER — Inpatient Hospital Stay: Admission: RE | Admit: 2022-03-03 | Payer: Medicare HMO | Source: Ambulatory Visit

## 2022-05-14 ENCOUNTER — Telehealth: Payer: Self-pay

## 2022-05-14 MED ORDER — OXYCODONE-ACETAMINOPHEN 5-325 MG PO TABS: 1.0000 | ORAL_TABLET | Freq: Every day | ORAL | 0 refills | Status: DC | PRN

## 2022-05-14 NOTE — Telephone Encounter (Signed)
Pmp report:  Filled  Written  ID  Drug  QTY  Days  Prescriber  RX #  Dispenser  Refill  Daily Dose*  Pymt Type  PMP  05/02/2022 02/27/2022 1  Lorazepam 1 Mg Tablet 60.00 30 Ja Joh [id number] [facility] ([id number]) 2/2 2.00 LME Medicare [place] 04/01/2022 02/27/2022 1  Lorazepam 1 Mg Tablet 60.00 30 Ja Joh [id number] [facility] ([id number]) 1/2 2.00 LME Medicare [place] 03/25/2022 02/21/2022 1  Oxycodone-Acetaminophen 5-325 60.00 60 Me Lov [id number] [facility] ([id number]) 0/0 7.50 MME Medicare [place]  Please send Oxycodone refill to [facility] on [address]

## 2022-05-23 ENCOUNTER — Other Ambulatory Visit: Payer: Self-pay | Admitting: Internal Medicine

## 2022-05-27 ENCOUNTER — Ambulatory Visit (HOSPITAL_COMMUNITY)
Admission: RE | Admit: 2022-05-27 | Discharge: 2022-05-27 | Disposition: A | Discharge status: Home / Self Care | Payer: Medicare HMO | Source: Ambulatory Visit | Attending: Internal Medicine | Admitting: Internal Medicine

## 2022-05-27 ENCOUNTER — Encounter (HOSPITAL_COMMUNITY): Payer: Self-pay

## 2022-05-27 DIAGNOSIS — E7800 Pure hypercholesterolemia, unspecified: Secondary | ICD-10-CM | POA: Insufficient documentation

## 2022-05-27 DIAGNOSIS — I10 Essential (primary) hypertension: Secondary | ICD-10-CM | POA: Insufficient documentation

## 2022-05-28 ENCOUNTER — Other Ambulatory Visit: Payer: Self-pay | Admitting: Internal Medicine

## 2022-05-28 DIAGNOSIS — R931 Abnormal findings on diagnostic imaging of heart and coronary circulation: Secondary | ICD-10-CM

## 2022-05-28 MED ORDER — ASPIRIN 81 MG PO TBEC: 81.0000 mg | DELAYED_RELEASE_TABLET | Freq: Every day | ORAL | 12 refills | Status: DC

## 2022-05-29 ENCOUNTER — Emergency Department (HOSPITAL_COMMUNITY): Payer: Medicare HMO

## 2022-05-29 ENCOUNTER — Encounter (HOSPITAL_COMMUNITY): Payer: Self-pay | Admitting: Emergency Medicine

## 2022-05-29 ENCOUNTER — Emergency Department (HOSPITAL_COMMUNITY)
Admission: EM | Admit: 2022-05-29 | Discharge: 2022-05-29 | Disposition: A | Discharge status: Home / Self Care | Payer: Medicare HMO | Attending: Emergency Medicine | Admitting: Emergency Medicine

## 2022-05-29 DIAGNOSIS — R509 Fever, unspecified: Secondary | ICD-10-CM | POA: Diagnosis not present

## 2022-05-29 DIAGNOSIS — Z79899 Other long term (current) drug therapy: Secondary | ICD-10-CM | POA: Diagnosis not present

## 2022-05-29 DIAGNOSIS — R112 Nausea with vomiting, unspecified: Secondary | ICD-10-CM | POA: Insufficient documentation

## 2022-05-29 DIAGNOSIS — N281 Cyst of kidney, acquired: Secondary | ICD-10-CM | POA: Diagnosis not present

## 2022-05-29 DIAGNOSIS — R197 Diarrhea, unspecified: Secondary | ICD-10-CM | POA: Insufficient documentation

## 2022-05-29 DIAGNOSIS — Z87442 Personal history of urinary calculi: Secondary | ICD-10-CM | POA: Insufficient documentation

## 2022-05-29 DIAGNOSIS — R109 Unspecified abdominal pain: Secondary | ICD-10-CM | POA: Diagnosis not present

## 2022-05-29 DIAGNOSIS — K5730 Diverticulosis of large intestine without perforation or abscess without bleeding: Secondary | ICD-10-CM | POA: Diagnosis not present

## 2022-05-29 DIAGNOSIS — D72829 Elevated white blood cell count, unspecified: Secondary | ICD-10-CM | POA: Insufficient documentation

## 2022-05-29 DIAGNOSIS — I10 Essential (primary) hypertension: Secondary | ICD-10-CM | POA: Insufficient documentation

## 2022-05-29 DIAGNOSIS — R000 Tachycardia, unspecified: Secondary | ICD-10-CM | POA: Diagnosis not present

## 2022-05-29 DIAGNOSIS — K429 Umbilical hernia without obstruction or gangrene: Secondary | ICD-10-CM | POA: Diagnosis not present

## 2022-05-29 DIAGNOSIS — Z7982 Long term (current) use of aspirin: Secondary | ICD-10-CM | POA: Diagnosis not present

## 2022-05-29 DIAGNOSIS — N3289 Other specified disorders of bladder: Secondary | ICD-10-CM | POA: Diagnosis not present

## 2022-05-29 LAB — COMPREHENSIVE METABOLIC PANEL
ALT: 41 U/L (ref 0–44)
AST: 46 U/L — ABNORMAL HIGH (ref 15–41)
Albumin: 3.6 g/dL (ref 3.5–5.0)
Alkaline Phosphatase: 39 U/L (ref 38–126)
Anion gap: 11 (ref 5–15)
BUN: 17 mg/dL (ref 6–20)
CO2: 24 mmol/L (ref 22–32)
Calcium: 8.5 mg/dL — ABNORMAL LOW (ref 8.9–10.3)
Chloride: 98 mmol/L (ref 98–111)
Creatinine, Ser: 1.76 mg/dL — ABNORMAL HIGH (ref 0.61–1.24)
GFR, Estimated: 45 mL/min — ABNORMAL LOW (ref >60)
Glucose, Bld: 135 mg/dL — ABNORMAL HIGH (ref 70–99)
Potassium: 3.5 mmol/L (ref 3.5–5.1)
Sodium: 133 mmol/L — ABNORMAL LOW (ref 135–145)
Total Bilirubin: 0.9 mg/dL (ref 0.3–1.2)
Total Protein: 6.3 g/dL — ABNORMAL LOW (ref 6.5–8.1)

## 2022-05-29 LAB — CBC WITH DIFFERENTIAL/PLATELET
Abs Immature Granulocytes: 0.06 10*3/uL (ref 0.00–0.07)
Basophils Absolute: 0 10*3/uL (ref 0.0–0.1)
Basophils Relative: 0 %
Eosinophils Absolute: 0.8 10*3/uL — ABNORMAL HIGH (ref 0.0–0.5)
Eosinophils Relative: 7 %
HCT: 47.8 % (ref 39.0–52.0)
Hemoglobin: 15.9 g/dL (ref 13.0–17.0)
Immature Granulocytes: 1 %
Lymphocytes Relative: 3 %
Lymphs Abs: 0.3 10*3/uL — ABNORMAL LOW (ref 0.7–4.0)
MCH: 26.7 pg (ref 26.0–34.0)
MCHC: 33.3 g/dL (ref 30.0–36.0)
MCV: 80.3 fL (ref 80.0–100.0)
Monocytes Absolute: 0.4 10*3/uL (ref 0.1–1.0)
Monocytes Relative: 4 %
Neutro Abs: 9 10*3/uL — ABNORMAL HIGH (ref 1.7–7.7)
Neutrophils Relative %: 85 %
Platelets: 185 10*3/uL (ref 150–400)
RBC: 5.95 MIL/uL — ABNORMAL HIGH (ref 4.22–5.81)
RDW: 14.3 % (ref 11.5–15.5)
WBC Morphology: INCREASED
WBC: 10.6 10*3/uL — ABNORMAL HIGH (ref 4.0–10.5)
nRBC: 0 % (ref 0.0–0.2)

## 2022-05-29 LAB — URINALYSIS, ROUTINE W REFLEX MICROSCOPIC
Bilirubin Urine: NEGATIVE
Glucose, UA: NEGATIVE mg/dL
Hgb urine dipstick: NEGATIVE
Ketones, ur: NEGATIVE mg/dL
Leukocytes,Ua: NEGATIVE
Nitrite: NEGATIVE
Protein, ur: NEGATIVE mg/dL
Specific Gravity, Urine: 1.013 (ref 1.005–1.030)
pH: 6 (ref 5.0–8.0)

## 2022-05-29 LAB — LIPASE, BLOOD: Lipase: 33 U/L (ref 11–51)

## 2022-05-29 MED ORDER — ONDANSETRON HCL 4 MG PO TABS: 4.0000 mg | ORAL_TABLET | Freq: Four times a day (QID) | ORAL | 0 refills | Status: DC

## 2022-05-29 MED ORDER — SODIUM CHLORIDE 0.9 % IV BOLUS
1000.0000 mL | Freq: Once | INTRAVENOUS | Status: AC
  Administered 2022-05-29: 1000 mL via INTRAVENOUS

## 2022-05-29 MED ORDER — ACETAMINOPHEN 325 MG PO TABS
650.0000 mg | ORAL_TABLET | Freq: Once | ORAL | Status: AC
  Administered 2022-05-29: 650 mg via ORAL
  Filled 2022-05-29: qty 2

## 2022-05-29 MED ORDER — OXYCODONE-ACETAMINOPHEN 5-325 MG PO TABS
1.0000 | ORAL_TABLET | Freq: Once | ORAL | Status: AC
  Administered 2022-05-29: 1 via ORAL
  Filled 2022-05-29: qty 1

## 2022-05-29 MED ORDER — ONDANSETRON 4 MG PO TBDP
4.0000 mg | ORAL_TABLET | Freq: Once | ORAL | Status: AC
  Administered 2022-05-29: 4 mg via ORAL
  Filled 2022-05-29: qty 1

## 2022-05-29 MED ORDER — AZITHROMYCIN 250 MG PO TABS: 500.0000 mg | ORAL_TABLET | Freq: Every day | ORAL | Status: DC

## 2022-05-29 NOTE — ED Provider Triage Note (Signed)
Emergency Medicine Provider Triage Evaluation Note  Darren Black , a 56 y.o. male  was evaluated in triage.  Pt complains of right-sided flank pain that radiates to lower abdomen with associated nausea and vomiting, denies bloody or bilious emesis.  No fevers, decreased urine output today but no dysuria urgency or frequency.  Poor p.o. tolerance..  Review of Systems  Positive: As above Negative: Chest pain, shortness of breath, palpitations  Physical Exam  BP 134/88 (BP Location: Right Arm)   Pulse (!) 113   Temp 99.1 F (37.3 C)   Resp (!) 22   Wt 116.1 kg   SpO2 99%   BMI 40.10 kg/m  Gen:   Awake, no distress   Resp:  Normal effort  MSK:   Moves extremities without difficulty  Other:  RRR no M/R/G.  Lungs CTA B.  Right-sided CVAT and abdominal tenderness palpation.  Generalized abdominal palpation worse on the right side than the left.  Medical Decision Making  Medically screening exam initiated at 3:53 AM.  Appropriate orders placed.  Lincoln Maxin was informed that the remainder of the evaluation will be completed by another provider, this initial triage assessment does not replace that evaluation, and the importance of remaining in the ED until their evaluation is complete.  This chart was dictated using voice recognition software, Dragon. Despite the best efforts of this provider to proofread and correct errors, errors may still occur which can change documentation meaning.    Emeline Darling, PA-C 05/29/22 0401

## 2022-05-29 NOTE — ED Provider Notes (Cosign Needed Addendum)
Darren Black [facility] EMERGENCY DEPARTMENT Provider Note   CSN: [id number] Arrival date & time: 05/29/22  0330     History  Chief Complaint  Patient presents with   Flank Pain    Darren Black is a 56 y.o. male, history of hypertension, who presents to the ED secondary to right-sided flank pain, fever, nausea, vomiting, diarrhea for the last day and a half.  States he did eat a hero sandwich on Tuesday and felt little bit unwell afterwards, little bloated, then Wednesday/yesterday, he started having nausea, vomiting, diarrhea, fevers and chills.  He has had 10+ episodes of nonbloody watery diarrhea in the past day, and has had 4-5 episodes of vomitus since then.  Last took Tylenol at 10 AM.  Notes he is also having some right-sided flank pain, has history of kidney stones and thought it was initially that.  Denies any recent antibiotic use.   Flank Pain     Home Medications Prior to Admission medications   Medication Sig Start Date End Date Taking? Authorizing Provider  Amino Acids (COMPLETE AMINO ACID MIX PO) Take by mouth.    [provider]  amLODipine (NORVASC) 10 MG tablet Take 1 tablet by mouth once daily 08/13/21   Corwin Levins, MD  aspirin EC 81 MG tablet Take 1 tablet (81 mg total) by mouth daily. Swallow whole. 05/28/22   Corwin Levins, MD  benazepril (LOTENSIN) 40 MG tablet Take 1 tablet (40 mg total) by mouth daily. 01/07/22   Corwin Levins, MD  Cetirizine HCl (KLS ALLER-TEC PO) Take by mouth.    [provider]  Cholecalciferol (D3 5000 PO) Take by mouth.    [provider]  Coenzyme Q10 (COQ10) 100 MG CAPS Take 300 mg by mouth.    [provider]  cyclobenzaprine (FLEXERIL) 5 MG tablet Take 1-2 tablets (5-10 mg total) by mouth 3 (three) times daily as needed for muscle spasms. 10/30/21   Lovorn, Aundra Millet, MD  doxazosin (CARDURA) 4 MG tablet TAKE 1 TABLET BY MOUTH AT BEDTIME 05/23/22   Corwin Levins, MD  DULoxetine (CYMBALTA) 60 MG  capsule Take 1 capsule by mouth once daily in the morning 08/13/21   Corwin Levins, MD  ezetimibe (ZETIA) 10 MG tablet Take 1 tablet by mouth once daily 07/22/21   Corwin Levins, MD  fluticasone [facility]) 50 MCG/ACT nasal spray Use 2 spray(s) in each nostril once daily 10/04/21   Corwin Levins, MD  Ginger, Zingiber officinalis, (GINGER ROOT) 550 MG CAPS Take by mouth.    [provider]  hydrochlorothiazide (HYDRODIURIL) 25 MG tablet Take 1/2 (one-half) tablet by mouth once daily 01/07/22   Corwin Levins, MD  LORazepam (ATIVAN) 1 MG tablet Take 1 tablet by mouth twice daily as needed for anxiety 02/27/22   Corwin Levins, MD  MILK THISTLE PO Milk Thistle    [provider]  Omega 3-6-9 Fatty Acids (TRIPLE OMEGA COMPLEX PO) Take by mouth.    [provider]  oxyCODONE-acetaminophen (PERCOCET/ROXICET) 5-325 MG tablet Take 1 tablet by mouth daily as needed for severe pain. 05/14/22   Lovorn, Aundra Millet, MD  potassium chloride (KLOR-CON) 10 MEQ tablet Take 1 tablet (10 mEq total) by mouth daily. 01/07/22   Corwin Levins, MD  pravastatin (PRAVACHOL) 80 MG tablet Take 1 tablet by mouth once daily 07/18/21   Corwin Levins, MD  Saw Palmetto 1000 MG CAPS Take by mouth.    [provider]  Sodium Hyaluronate, oral, (HYALURONIC ACID) 100 MG CAPS Take 200 mg by mouth.    [provider]  tamsulosin (FLOMAX) 0.4 MG CAPS capsule Take 1 capsule by mouth once daily 09/04/21   Corwin LevinsJohn, James W, MD  testosterone (ANDROGEL) 50 MG/5GM (1%) GEL Place 5 g onto the skin daily. 04/02/20   Corwin LevinsJohn, James W, MD  Turmeric 500 MG CAPS Take by mouth.    [provider]      Allergies    Gabapentin and Crestor [rosuvastatin calcium]    Review of Systems   Review of Systems  Constitutional:  Positive for fever.  Gastrointestinal:  Positive for diarrhea, nausea and vomiting.  Genitourinary:  Positive for flank pain.    Physical Exam Updated Vital Signs BP 128/84 (BP Location: Right Arm)    Pulse 85   Temp 98.7 F (37.1 C) (Oral)   Resp 14   Wt 116.1 kg   SpO2 96%   BMI 40.10 kg/m  Physical Exam Constitutional:      Appearance: Normal appearance. He is obese.  HENT:     Head: Normocephalic.     Mouth/Throat:     Mouth: Mucous membranes are moist.  Eyes:     Extraocular Movements: Extraocular movements intact.     Pupils: Pupils are equal, round, and reactive to light.  Cardiovascular:     Rate and Rhythm: Normal rate and regular rhythm.     Pulses: Normal pulses.  Pulmonary:     Effort: Pulmonary effort is normal.     Breath sounds: Normal breath sounds.  Abdominal:     General: Abdomen is flat. Bowel sounds are normal.     Palpations: Abdomen is soft.     Tenderness: There is abdominal tenderness. There is no right CVA tenderness, left CVA tenderness, guarding or rebound.     Comments: +R flank  Neurological:     Mental Status: He is alert.     ED Results / Procedures / Treatments   Labs (all labs ordered are listed, but only abnormal results are displayed) Labs Reviewed  CBC WITH DIFFERENTIAL/PLATELET - Abnormal; Notable for the following components:      Result Value   WBC 10.6 (*)    RBC 5.95 (*)    Neutro Abs 9.0 (*)    Lymphs Abs 0.3 (*)    Eosinophils Absolute 0.8 (*)    All other components within normal limits  COMPREHENSIVE METABOLIC PANEL - Abnormal; Notable for the following components:   Sodium 133 (*)    Glucose, Bld 135 (*)    Creatinine, Ser 1.76 (*)    Calcium 8.5 (*)    Total Protein 6.3 (*)    AST 46 (*)    GFR, Estimated 45 (*)    All other components within normal limits  GASTROINTESTINAL PANEL BY PCR, STOOL (REPLACES STOOL CULTURE)  LIPASE, BLOOD  URINALYSIS, ROUTINE W REFLEX MICROSCOPIC  LACTIC ACID, PLASMA  LACTIC ACID, PLASMA    EKG None  Radiology CT Renal Stone Study  Result Date: 05/29/2022 CLINICAL DATA:  Right-sided flank pain, kidney stone suspected. EXAM: CT ABDOMEN AND PELVIS WITHOUT CONTRAST  TECHNIQUE: Multidetector CT imaging of the abdomen and pelvis was performed following the standard protocol without IV contrast. RADIATION DOSE REDUCTION: This exam was performed according to the departmental dose-optimization program which includes automated exposure control, adjustment of the mA and/or kV according to patient size and/or use of iterative reconstruction technique. COMPARISON:  03/23/2019. FINDINGS: Lower chest: No acute abnormality. Hepatobiliary:  No focal liver abnormality is seen. Status post cholecystectomy. No biliary dilatation. Pancreas: Unremarkable. No pancreatic ductal dilatation or surrounding inflammatory changes. Spleen: Normal in size without focal abnormality. Adrenals/Urinary Tract: A 1.6 cm nodule is noted in the right adrenal gland. The left adrenal gland is within normal limits. A cyst is present in the mid left kidney. No renal calculus or hydronephrosis. No ureteral calculus is seen. There is a cystic structure communicating with the urinary bladder on the left posteriorly measuring up to 7.4 cm, increased in size from the prior exam. Stomach/Bowel: Stomach is within normal limits. Appendix is surgically absent. No evidence of bowel wall thickening, distention, or inflammatory changes. No free air or pneumatosis. Scattered diverticula are present along the colon without evidence of diverticulitis. Vascular/Lymphatic: Aortic atherosclerosis. No enlarged abdominal or pelvic lymph nodes. Reproductive: Prostate is unremarkable. Other: No abdominopelvic ascites. A fat containing umbilical hernia is present. Musculoskeletal: Degenerative changes are present in the thoracolumbar spine. No acute osseous abnormality. IMPRESSION: 1. No renal calculus or obstructive uropathy bilaterally. 2. Large bladder diverticulum on the left, increased in size from the prior exam. 3. Diverticulosis without diverticulitis. 4. Aortic atherosclerosis. Electronically Signed   By: Thornell Sartorius M.D.   On:  05/29/2022 05:00   CT CARDIAC SCORING (SELF PAY ONLY)  Addendum Date: 05/28/2022   ADDENDUM REPORT: 05/28/2022 09:40 EXAM: OVER-READ INTERPRETATION  CT CHEST The following report is an over-read performed by radiologist Dr. Schuyler Amor [facility] on 05/28/2022. This over-read does not include interpretation of cardiac or coronary anatomy or pathology. The coronary calcium score interpretation by the cardiologist is attached. COMPARISON:  None FINDINGS: Vascular: No acute abnormality. Mediastinum/nodes: No mass or adenopathy identified. Lungs/pleura: No pleural effusion, airspace consolidation, atelectasis, or pneumothorax. No suspicious lung nodules. Upper abdomen: No acute abnormality. Musculoskeletal: No acute or suspicious osseous findings. IMPRESSION: No significant incidental extracardiac findings. Electronically Signed   By: Signa Kell M.D.   On: 05/28/2022 09:40   Result Date: 05/28/2022 CLINICAL DATA:  Cardiovascular disease risk stratification CAD screening, intermediate CAD risk, treadmill candidate EXAM: CT Coronary Calcium Score TECHNIQUE: A gated, non-contrast computed tomography scan of the heart was performed using 54mm slice thickness. Axial images were analyzed on a dedicated workstation. Calcium scoring of the coronary arteries was performed using the Agatston method. FINDINGS: Suboptimal image quality reduces the specificity of this study to define coronary calcifications. Coronary Calcium Score: Left main: 8.5 Left anterior descending artery: 366 Left circumflex artery: 86.3 Right coronary artery: 2.51 Total: 464 Percentile: 95th Pericardium: Normal. Ascending Aorta: Normal caliber. Ascending aorta measures approximately 46mm at the mid ascending aorta measured in an axial plane. Non-cardiac: See separate report from [facility]. IMPRESSION: Coronary calcium score of 464. This was 95th percentile for age-, race-, and sex-matched controls. RECOMMENDATIONS:  Coronary artery calcium (CAC) score is a strong predictor of incident coronary heart disease (CHD) and provides predictive information beyond traditional risk factors. CAC scoring is reasonable to use in the decision to withhold, postpone, or initiate statin therapy in intermediate-risk or selected borderline-risk asymptomatic adults (age 83-75 years and LDL-C >=70 to <190 mg/dL) who do not have diabetes or established atherosclerotic cardiovascular disease (ASCVD).* In intermediate-risk (10-year ASCVD risk >=7.5% to <20%) adults or selected borderline-risk (10-year ASCVD risk >=5% to <7.5%) adults in whom a CAC score is measured for the purpose of making a treatment decision the following recommendations have been made: If CAC=0, it is reasonable to withhold statin therapy and reassess in 5 to  10 years, as long as higher risk conditions are absent (diabetes mellitus, family history of premature CHD in first degree relatives (males <55 years; females <65 years), cigarette smoking, or LDL >=190 mg/dL). If CAC is 1 to 99, it is reasonable to initiate statin therapy for patients >=33 years of age. If CAC is >=100 or >=75th percentile, it is reasonable to initiate statin therapy at any age. Cardiology referral should be considered for patients with CAC scores >=400 or >=75th percentile. *2018 AHA/ACC/AACVPR/AAPA/ABC/ACPM/ADA/AGS/APhA/ASPC/NLA/PCNA Guideline on the Management of Blood Cholesterol: A Report of the American College of Cardiology/American Heart Association Task Force on Clinical Practice Guidelines. J Am Coll Cardiol. 2019;73(24):3168-3209. Electronically Signed: By: Weston Brass M.D. On: 05/27/2022 21:50    Procedures Procedures   Medications Ordered in ED Medications  ondansetron (ZOFRAN-ODT) disintegrating tablet 4 mg (has no administration in time range)  sodium chloride 0.9 % bolus 1,000 mL (has no administration in time range)  azithromycin (ZITHROMAX) tablet 500 mg (has no administration  in time range)  ondansetron (ZOFRAN-ODT) disintegrating tablet 4 mg (4 mg Oral Given 05/29/22 0404)  oxyCODONE-acetaminophen (PERCOCET/ROXICET) 5-325 MG per tablet 1 tablet (1 tablet Oral Given 05/29/22 0404)  acetaminophen (TYLENOL) tablet 650 mg (650 mg Oral Given 05/29/22 1042)    ED Course/ Medical Decision Making/ A&P Clinical Course as of 05/29/22 1512  Thu May 29, 2022  1508 56 yoM, ate a sandwhich Tuesday, felt bad. Fever/N/V/D. Tachy and febrile in the ED [JN]    Clinical Course User Index [JN] Nogle, [place], MD                           Medical Decision Making Amount and/or Complexity of Data Reviewed Labs: ordered.  Risk OTC drugs. Prescription drug management.  This patient presents to the ED for concern of N/V/R flank pain  Lab Tests:  I Ordered, and personally interpreted labs.  The pertinent results include:  mild leukocytosis of 10.6k, bandemia, mild dehydration w/ Cr of 1.76 (baseline 1.4).    Imaging Studies ordered:  I ordered imaging studies including CT renal stone--no stones visualized, no hydronephrosis, incidental right adrenal nodule, a bladder diverticula  Cardiac Monitoring: / EKG:  The patient was maintained on a cardiac monitor.  I personally viewed and interpreted the cardiac monitored which showed an underlying rhythm of: sinus tachycardia w/PVCs   Problem List / ED Course / Critical interventions / Medication management  I ordered medication including zofran, IVF, and azithromycin  for concern for infectious diarrhea and n/v   Test / Admission - Considered:  Patient is a 56 year old male, here for nausea, vomiting, diarrhea x1-1/2 days.  Abdominal discomfort started after eating here as sandwich, fever/vomiting, diarrhea started the next day.  Reports nonbloody watery stools 10+.  Wife is not sick.  Concern for bacterial versus viral diarrhea.  He is nontachycardic, well-appearing on exam.  EKG was from last night.  Sending stool culture to  evaluate if bacterial vs viral. Will start on antibiotics.  He will need to undergo a p.o. trial, lactate will need to be retrieved, and we are also ordering a stool sample due to concerns for possible bacterial gastroenteritis and need for possible antibiotics.  Care transferred to oncoming resident, she will disposition patient  Final Clinical Impression(s) / ED Diagnoses Final diagnoses:  Right flank pain  Nausea vomiting and diarrhea    Rx / DC Orders ED Discharge Orders     None  Osvaldo Shipper, [place] 05/29/22 1513    Fransico Meadow, MD 05/30/22 0745    Osvaldo Shipper, PA 06/13/22 1506    Fransico Meadow, MD 06/16/22 [phone]

## 2022-05-29 NOTE — Discharge Instructions (Addendum)
Please follow-up with your primary care doctor in 2-3 days.  If you are unable to tolerate p.o. intake please return to the ER.  If you feel like your symptoms are getting worse, please return to the ER.  Today, we found an incidental adrenal nodule on your CT scan, please follow-up with your PCP in regards to this.

## 2022-05-29 NOTE — ED Triage Notes (Signed)
Pt presents for  starting yesterday R flank and low back pain, N/V, not tolerating PO intake.  Last BM this AM after prune juice.  Denies blood in urine H/o cholecystectomy, appendectomy, htn, hld, CKD (not on HD)

## 2022-05-29 NOTE — ED Provider Notes (Signed)
I assumed care of Darren Black on 05/30/2022 at 1500 from [place], [place]   Briefly, Darren Black is a 56 y.o. male who presented for fever, nausea, vomiting. The signout from the previous ED provider included:    Clinical Course as of 05/30/22 0044  Thu May 29, 2022  1508 43 yoM, ate a sandwhich Tuesday, felt bad. Fever/N/V/D. Tachy and febrile in the ED [JN]    Clinical Course User Index [JN] Tineka Uriegas, [place], MD      Please refer to the original provider's note for additional information regarding the care of Darren Black.    Reassessment:  Vital Signs:  The most current vitals were Blood pressure 128/84, pulse 85, temperature 98.7 F (37.1 C), temperature source Oral, resp. rate 14, weight 116.1 kg, SpO2 96 %.   Hemodynamics:  The patient is hemodynamically stable.  Mental Status:  The patient is alert   Additional MDM:  Darren Black is a 56 y.o. male here with nausea, vomiting, diarrhea, fever.   On my initial exam, patient is generally well-appearing.  He had no significant complaints at this time.  States that he is not having abdominal pain.  He notes that his nausea and vomiting have been well controlled.  -laboratory and imaging study work-up obtained by the previous provider was reassuring as patient had no significant leukocytosis, his creatinine was close to his baseline, he had no significant metabolic abnormalities.  Patient's urinalysis unremarkable for urinary tract infection.  Patient CT renal stone study revealed evidence of diverticulosis without diverticulitis, no additional acute intra-abdominal pathology noted.  Given the patient being asymptomatic, patient not meeting SIRS criteria for sepsis, the patient having no significant abnormalities noted on his laboratory or imaging study work-up, patient having no significant anion gap, patient having no complaints, initial exam, do not feel that the patient requires a lactate at this time.  Feels the patient  is appropriate for discharge at this time.  Patient was encouraged to follow-up with his primary care doctor to discuss the results of his GI panel.  Patient does not require antibiotic therapy at this time as this was likely diarrhea and vomiting associated with foodborne illness due to the rapid onset of symptoms immediately following meat consumption.  Patient has no recent travel and does not require antibiotic therapy at this time as this is no evidence of traveler's diarrhea is not persistent in nature.  Patient encouraged to follow-up with his primary care physician should he have persistent symptoms, persistent fevers or any other concerning symptoms.   Discharge: Patient is felt to be medically appropriate for discharge at this time. Patient was informed of all pertinent physical exam, laboratory, and imaging findings. Patients suspected etiology of their symptom presentation was discussed with the patient and all questions were answered. Patient was instructed to follow up with their primary care doctor in 2-3 days for re-evaluation. Patient was given strict return precautions.     The plan for this patient was discussed with Dr. Tamera Punt, who voiced agreement and who oversaw evaluation and treatment of this patient.     Diagnosis:  1. Nausea vomiting and diarrhea   2. Right flank pain         Darren Black, [place], MD 05/30/22 4332    Darren Johns, MD 06/02/22 1131

## 2022-05-29 NOTE — ED Provider Notes (Signed)
3:02 PM Patient seen in conjunction with Small PA-C.   Patient with nausea, vomiting, diarrhea starting yesterday as well as generalized abdominal pain which is worse on the right side.  Patient with AKI today.  Also with fever, 100.8 F max on arrival to the emergency department, 100.4 F at home.  Work-up with bandemia, WBC 10.6.  Remainder of labs are reassuring.  CT without any obvious findings.   Plan: GI stool studies if possible.  Patient does not appear to have severe sepsis, but given fever and bandemia, will send lactate.  He will be given a fluid bolus.  He will be started on azithromycin x3 days.  Anticipate that he will be able to go home if he does well with the fluid bolus and continues to look well and lactate is okay.   Carlisle Cater, PA-C 05/29/22 1504    Fransico Meadow, MD 05/30/22 [phone]

## 2022-05-30 LAB — GASTROINTESTINAL PANEL BY PCR, STOOL (REPLACES STOOL CULTURE)

## 2022-06-01 ENCOUNTER — Other Ambulatory Visit: Payer: Self-pay | Admitting: Internal Medicine

## 2022-06-09 ENCOUNTER — Ambulatory Visit: Payer: Medicare HMO | Admitting: Physical Medicine and Rehabilitation

## 2022-06-13 ENCOUNTER — Ambulatory Visit: Payer: Medicare HMO | Attending: Internal Medicine | Admitting: Internal Medicine

## 2022-06-13 ENCOUNTER — Encounter: Payer: Self-pay | Admitting: Internal Medicine

## 2022-06-13 VITALS — BP 108/66 | HR 73 | Ht 67.0 in | Wt 268.6 lb

## 2022-06-13 DIAGNOSIS — R931 Abnormal findings on diagnostic imaging of heart and coronary circulation: Secondary | ICD-10-CM

## 2022-06-13 MED ORDER — ROSUVASTATIN CALCIUM 40 MG PO TABS: 40.0000 mg | ORAL_TABLET | Freq: Every day | ORAL | 3 refills | Status: DC

## 2022-06-13 NOTE — Patient Instructions (Signed)
Medication Instructions:  STOP pravastatin START rosuvastatin (Crestor) 40 mg daily  *If you need a refill on your cardiac medications before your next appointment, please call your pharmacy*  Follow-Up: At [facility], you and your health needs are our priority.  As part of our continuing mission to provide you with exceptional heart care, we have created designated Provider Care Teams.  These Care Teams include your primary Cardiologist (physician) and Advanced Practice Providers (APPs -  Physician Assistants and Nurse Practitioners) who all work together to provide you with the care you need, when you need it.  We recommend signing up for the patient portal called "MyChart".  Sign up information is provided on this After Visit Summary.  MyChart is used to connect with patients for Virtual Visits (Telemedicine).  Patients are able to view lab/test results, encounter notes, upcoming appointments, etc.  Non-urgent messages can be sent to your provider as well.   To learn more about what you can do with MyChart, go to [email or web address].    Your next appointment:   AS NEEDED with Dr. Harl Bowie

## 2022-06-13 NOTE — Progress Notes (Signed)
Cardiology Office Note:    Date:  06/13/2022   ID:  Darren Black, DOB 05/26/1966, MRN [id number]  PCP:  Biagio Borg, MD   Superior Providers Cardiologist:  None     Referring MD: Biagio Borg, MD   No chief complaint on file. Elevated CAC  History of Present Illness:    Darren Black is a 56 y.o. male with a hx of OSA on CPAP, HTN, referral for CAC score 464; 95th percentile. He has no hx of  cardiac disease. No prior cardiac w/u. He is a non smoker. No activity limiting CP/SOB.He denies syncope or palpitations. He denies PND/orthopnea/LE edema. He states that his right leg precludes him from increased physical activity. He states his diet is keto. However he eats heavy meals on the weekends with his mom.   Past Medical History:  Diagnosis Date   Anxiety    BPH (benign prostatic hypertrophy)    Chronic lower back pain    nerve damage w/ leg pain   Depression    Diarrhea 03/23/2019   Family history of adverse reaction to anesthesia    MOTHER--- HARD TO WAKE   History of panic attacks    Hyperlipidemia    Hypertension    Meatal stenosis    OSA on CPAP    moderate to severe OSA  per study 04-28-2007 uses CPAP   PONV (postoperative nausea and vomiting)     Past Surgical History:  Procedure Laterality Date   APPENDECTOMY  2003   BREATH TEK H PYLORI  04/27/2012   Procedure: BREATH TEK H PYLORI;  Surgeon: Pedro Earls, MD;  Location: [facility];  Service: General;  Laterality: N/A;   CHOLECYSTECTOMY N/A 03/24/2019   Procedure: LAPAROSCOPIC CHOLECYSTECTOMY WITH INTRAOPERATIVE CHOLANGIOGRAM;  Surgeon: Donnie Mesa, MD;  Location: [place];  Service: General;  Laterality: N/A;   CYSTOSCOPY WITH URETHRAL DILATATION N/A 04/12/2015   Procedure: CYSTOSCOPY WITH URETHRAL DILATATION, RETROGRADE AND URETHROGRAM WITH BLADDER BIOPSY;  Surgeon: Irine Seal, MD;  Location: [place];  Service: Urology;  Laterality: N/A;   ROTATOR CUFF REPAIR Right  01-21-2012   TRANSTHORACIC ECHOCARDIOGRAM  03-26-2007   normal LVSF, ef 60%,  mild AV calcification without stenosis,  mild MR and TR,  mild LAE   URETHROGRAM N/A 04/12/2015   Procedure: URETHROGRAM;  Surgeon: Irine Seal, MD;  Location: [facility];  Service: Urology;  Laterality: N/A;    Current Medications: No outpatient medications have been marked as taking for the 06/13/22 encounter (Appointment) with Janina Mayo, MD.     Allergies:   Gabapentin and Crestor [rosuvastatin calcium]   Social History   Socioeconomic History   Marital status: Married    Spouse name: Not on file   Number of children: Not on file   Years of education: Not on file   Highest education level: Not on file  Occupational History   Occupation: disabled, LBP and anxiety  Tobacco Use   Smoking status: Never   Smokeless tobacco: Never  Vaping Use   Vaping Use: Never used  Substance and Sexual Activity   Alcohol use: Yes    Comment: rare   Drug use: No   Sexual activity: Not on file  Other Topics Concern   Not on file  Social History Narrative   Not on file   Social Determinants of Health   Financial Resource Strain: Low Risk  (01/22/2022)   Overall Financial Resource Strain (CARDIA)    Difficulty  of Paying Living Expenses: Not hard at all  Food Insecurity: No Food Insecurity (01/22/2022)   Hunger Vital Sign    Worried About Running Out of Food in the Last Year: Never true    Ran Out of Food in the Last Year: Never true  Transportation Needs: No Transportation Needs (01/22/2022)   PRAPARE - Administrator, Civil Service (Medical): No    Lack of Transportation (Non-Medical): No  Physical Activity: Inactive (01/22/2022)   Exercise Vital Sign    Days of Exercise per Week: 0 days    Minutes of Exercise per Session: 0 min  Stress: No Stress Concern Present (01/22/2022)   [employer] of Occupational Health - Occupational Stress Questionnaire    Feeling of Stress :  Not at all  Social Connections: Moderately Isolated (01/22/2022)   Social Connection and Isolation Panel [NHANES]    Frequency of Communication with Friends and Family: More than three times a week    Frequency of Social Gatherings with Friends and Family: Once a week    Attends Religious Services: Never    Database administrator or Organizations: No    Attends Engineer, structural: Never    Marital Status: Married     Family History: The patient's family history includes Cancer in his father; Coronary artery disease in his father; Diabetes in an other family member; Hyperlipidemia in an other family member; Hypertension in an other family member; Stroke in an other family member.  ROS:   Please see the history of present illness.     All other systems reviewed and are negative.  EKGs/Labs/Other Studies Reviewed:    The following studies were reviewed today:   EKG:  EKG is  ordered today.  The ekg ordered today demonstrates   06/13/2022- NSR  Recent Labs: 01/03/2022: TSH 1.74 05/29/2022: ALT 41; BUN 17; Creatinine, Ser 1.76; Hemoglobin 15.9; Platelets 185; Potassium 3.5; Sodium 133   Recent Lipid Panel    Component Value Date/Time   CHOL 140 01/03/2022 1223   TRIG 71.0 01/03/2022 1223   HDL 48.10 01/03/2022 1223   CHOLHDL 3 01/03/2022 1223   VLDL 14.2 01/03/2022 1223   LDLCALC 78 01/03/2022 1223   LDLDIRECT 139.0 01/02/2016 1512     Risk Assessment/Calculations:         Physical Exam:    VS:    Vitals:   06/13/22 0839 06/13/22 0850  BP: 112/76 108/66  Pulse: 73   SpO2: 95%      Wt Readings from Last 3 Encounters:  05/29/22 256 lb (116.1 kg)  02/21/22 274 lb 12.8 oz (124.6 kg)  01/07/22 263 lb (119.3 kg)     GEN:  Well nourished, well developed in no acute distress HEENT: Normal NECK: No JVD; No carotid bruits LYMPHATICS: No lymphadenopathy CARDIAC: RRR, no murmurs, rubs, gallops RESPIRATORY:  Clear to auscultation without rales, wheezing or  rhonchi  ABDOMEN: Soft, non-tender, non-distended MUSCULOSKELETAL:  No edema; No deformity  SKIN: Warm and dry NEUROLOGIC:  Alert and oriented x 3 PSYCHIATRIC:  Normal affect   ASSESSMENT:    Elevate CAC: Elevated CAC: > 75th percentile.  His CAC score increases his risk of CVD. Recommend asa 81 mg daily. LDL goal < 70 mg/dL. He is close. Change pravastatin to crestor 40; continue zetia 10 mg daily. Recommend continued lifestyle modifications including healthy diet, lipid management, DM2 screening, and exercise. We had a lengthy discussion about cutting his meal proportions and considering a health tracker to  keep track of his intake and steps. He prefers to have his PCP follow along with his lipids.  PLAN:    In order of problems listed above:  Change pravastatin to crestor 40 mg daily            Medication Adjustments/Labs and Tests Ordered: Current medicines are reviewed at length with the patient today.  Concerns regarding medicines are outlined above.  No orders of the defined types were placed in this encounter.  No orders of the defined types were placed in this encounter.   There are no Patient Instructions on file for this visit.   Signed, Maisie Fus, MD  06/13/2022 7:45 AM    [facility]

## 2022-06-19 ENCOUNTER — Telehealth: Payer: Self-pay

## 2022-06-19 MED ORDER — LORAZEPAM 2 MG PO TABS: 1.0000 mg | ORAL_TABLET | Freq: Two times a day (BID) | ORAL | 2 refills | Status: DC | PRN

## 2022-06-19 NOTE — Telephone Encounter (Signed)
Pharmacy does not have any .5 or 1.0 of Lorazepam, wife said they only have the 2 MG in stock. Wondering If patient can do the 2 mg and cut the pill in half.

## 2022-06-19 NOTE — Telephone Encounter (Signed)
Pharmacy is out of current dose, patient asks if it is ok the get the 2mg  and just cut in half, please advise.

## 2022-06-20 ENCOUNTER — Encounter: Payer: Medicare HMO | Attending: Physical Medicine and Rehabilitation | Admitting: Physical Medicine and Rehabilitation

## 2022-06-20 ENCOUNTER — Encounter: Payer: Self-pay | Admitting: Physical Medicine and Rehabilitation

## 2022-06-20 ENCOUNTER — Telehealth: Payer: Self-pay

## 2022-06-20 VITALS — BP 122/70 | HR 66 | Ht 67.0 in | Wt 268.0 lb

## 2022-06-20 DIAGNOSIS — E291 Testicular hypofunction: Secondary | ICD-10-CM | POA: Diagnosis not present

## 2022-06-20 DIAGNOSIS — Z5181 Encounter for therapeutic drug level monitoring: Secondary | ICD-10-CM | POA: Insufficient documentation

## 2022-06-20 DIAGNOSIS — G8929 Other chronic pain: Secondary | ICD-10-CM | POA: Diagnosis not present

## 2022-06-20 DIAGNOSIS — G894 Chronic pain syndrome: Secondary | ICD-10-CM | POA: Diagnosis not present

## 2022-06-20 DIAGNOSIS — M5441 Lumbago with sciatica, right side: Secondary | ICD-10-CM | POA: Insufficient documentation

## 2022-06-20 DIAGNOSIS — Z79891 Long term (current) use of opiate analgesic: Secondary | ICD-10-CM | POA: Insufficient documentation

## 2022-06-20 DIAGNOSIS — M5442 Lumbago with sciatica, left side: Secondary | ICD-10-CM | POA: Diagnosis not present

## 2022-06-20 MED ORDER — METHOCARBAMOL 500 MG PO TABS: 500.0000 mg | ORAL_TABLET | Freq: Three times a day (TID) | ORAL | 5 refills | Status: DC | PRN

## 2022-06-20 MED ORDER — OXYCODONE-ACETAMINOPHEN 5-325 MG PO TABS: 1.0000 | ORAL_TABLET | Freq: Two times a day (BID) | ORAL | 0 refills | Status: DC | PRN

## 2022-06-20 NOTE — Progress Notes (Signed)
Subjective:    Patient ID: Darren Black, male    DOB: 12/16/1965, 56 y.o.   MRN: [id number]  HPI Pt is a 56 yr old male with hx of R shoulder pain due to complete supraspinatus tear HTN; no DM; HLD, BPH, on Flomax and Prazosin; and allergies and low testosterone, depression/anxiety on Lorazepam/Cymbalta; ;  Here for f/u of chronic low back pain.    Doing "fine"- pretty much the same.   Stuffy from allergies.  Does take allergy- doesn't do nasal spray  Pain about the same- Was really bad after was in hospital 2 weeks ago-  Was there for 17 hours (15 hours in waiting room in chair)- for N/V- had food poisoning- couldn't get out of bed for ~ 4-7 days.  Took a couple of extra- so usually gets filled q2 months- needs a little early.   Flexeril made him feel weird- like rubber band that cannot stand   Pain Inventory Average Pain 8 Pain Right Now 8 My pain is sharp, stabbing, tingling, and aching  In the last 24 hours, has pain interfered with the following? General activity 7 Relation with others 6 Enjoyment of life 8 What TIME of day is your pain at its worst? morning , daytime, evening, and night Sleep (in general) Fair  Pain is worse with: walking, bending, sitting, inactivity, standing, and some activites Pain improves with: medication Relief from Meds: 5  Family History  Problem Relation Age of Onset   Coronary artery disease Father    Cancer Father        anaplastic thyroid cancer   Stroke Other        1st degree relative   Diabetes Other        1st degree relative   Hypertension Other    Hyperlipidemia Other    Social History   Socioeconomic History   Marital status: Married    Spouse name: Not on file   Number of children: Not on file   Years of education: Not on file   Highest education level: Not on file  Occupational History   Occupation: disabled, LBP and anxiety  Tobacco Use   Smoking status: Never   Smokeless tobacco: Never  Vaping Use    Vaping Use: Never used  Substance and Sexual Activity   Alcohol use: Yes    Comment: rare   Drug use: No   Sexual activity: Not on file  Other Topics Concern   Not on file  Social History Narrative   Not on file   Social Determinants of Health   Financial Resource Strain: Low Risk  (01/22/2022)   Overall Financial Resource Strain (CARDIA)    Difficulty of Paying Living Expenses: Not hard at all  Food Insecurity: No Food Insecurity (01/22/2022)   Hunger Vital Sign    Worried About Running Out of Food in the Last Year: Never true    Ran Out of Food in the Last Year: Never true  Transportation Needs: No Transportation Needs (01/22/2022)   PRAPARE - Administrator, Civil Service (Medical): No    Lack of Transportation (Non-Medical): No  Physical Activity: Inactive (01/22/2022)   Exercise Vital Sign    Days of Exercise per Week: 0 days    Minutes of Exercise per Session: 0 min  Stress: No Stress Concern Present (01/22/2022)   [employer] of Occupational Health - Occupational Stress Questionnaire    Feeling of Stress : Not at all  Social Connections: Moderately Isolated (  01/22/2022)   Social Connection and Isolation Panel [NHANES]    Frequency of Communication with Friends and Family: More than three times a week    Frequency of Social Gatherings with Friends and Family: Once a week    Attends Religious Services: Never    Marine scientist or Organizations: No    Attends Music therapist: Never    Marital Status: Married   Past Surgical History:  Procedure Laterality Date   APPENDECTOMY  2003   [place]  04/27/2012   Procedure: BREATH TEK H PYLORI;  Surgeon: Pedro Earls, MD;  Location: [facility];  Service: General;  Laterality: N/A;   CHOLECYSTECTOMY N/A 03/24/2019   Procedure: LAPAROSCOPIC CHOLECYSTECTOMY WITH INTRAOPERATIVE CHOLANGIOGRAM;  Surgeon: Donnie Mesa, MD;  Location: [place];  Service: General;  Laterality: N/A;    CYSTOSCOPY WITH URETHRAL DILATATION N/A 04/12/2015   Procedure: CYSTOSCOPY WITH URETHRAL DILATATION, RETROGRADE AND URETHROGRAM WITH BLADDER BIOPSY;  Surgeon: Irine Seal, MD;  Location: [place];  Service: Urology;  Laterality: N/A;   ROTATOR CUFF REPAIR Right 01-21-2012   TRANSTHORACIC ECHOCARDIOGRAM  03-26-2007   normal LVSF, ef 60%,  mild AV calcification without stenosis,  mild MR and TR,  mild LAE   URETHROGRAM N/A 04/12/2015   Procedure: URETHROGRAM;  Surgeon: Irine Seal, MD;  Location: [facility];  Service: Urology;  Laterality: N/A;   Past Surgical History:  Procedure Laterality Date   APPENDECTOMY  2003   BREATH TEK H PYLORI  04/27/2012   Procedure: BREATH TEK H PYLORI;  Surgeon: Pedro Earls, MD;  Location: [facility];  Service: General;  Laterality: N/A;   CHOLECYSTECTOMY N/A 03/24/2019   Procedure: LAPAROSCOPIC CHOLECYSTECTOMY WITH INTRAOPERATIVE CHOLANGIOGRAM;  Surgeon: Donnie Mesa, MD;  Location: [place];  Service: General;  Laterality: N/A;   CYSTOSCOPY WITH URETHRAL DILATATION N/A 04/12/2015   Procedure: CYSTOSCOPY WITH URETHRAL DILATATION, RETROGRADE AND URETHROGRAM WITH BLADDER BIOPSY;  Surgeon: Irine Seal, MD;  Location: [place];  Service: Urology;  Laterality: N/A;   ROTATOR CUFF REPAIR Right 01-21-2012   TRANSTHORACIC ECHOCARDIOGRAM  03-26-2007   normal LVSF, ef 60%,  mild AV calcification without stenosis,  mild MR and TR,  mild LAE   URETHROGRAM N/A 04/12/2015   Procedure: URETHROGRAM;  Surgeon: Irine Seal, MD;  Location: [facility];  Service: Urology;  Laterality: N/A;   Past Medical History:  Diagnosis Date   Anxiety    BPH (benign prostatic hypertrophy)    Chronic lower back pain    nerve damage w/ leg pain   Depression    Diarrhea 03/23/2019   Family history of adverse reaction to anesthesia    MOTHER--- HARD TO WAKE   History of panic attacks    Hyperlipidemia    Hypertension     Meatal stenosis    OSA on CPAP    moderate to severe OSA  per study 04-28-2007 uses CPAP   PONV (postoperative nausea and vomiting)    BP 122/70   Pulse 66   Ht 5\' 7"  (1.702 m)   Wt 268 lb (121.6 kg)   SpO2 96%   BMI 41.97 kg/m   Opioid Risk Score:   Fall Risk Score:  `1  Depression screen [facility] 2/9     06/20/2022   11:34 AM 02/21/2022   11:09 AM 01/22/2022    1:30 PM 01/07/2022    3:43 PM 01/07/2022    2:51 PM 10/30/2021   10:26  AM 07/29/2021   11:50 AM  Depression screen PHQ 2/9  Decreased Interest 3 1 1  0 1 0 0  Down, Depressed, Hopeless 3 1 1  0 1 0 0  PHQ - 2 Score 6 2 2  0 2 0 0  Altered sleeping   0  1    Tired, decreased energy   3  3    Change in appetite   0  0    Feeling bad or failure about yourself    0  1    Trouble concentrating   2  2    Moving slowly or fidgety/restless   0  0    Suicidal thoughts   0  0    PHQ-9 Score   7  9    Difficult doing work/chores   Not difficult at all        Review of Systems  Constitutional: Negative.   HENT: Negative.    Eyes: Negative.   Respiratory: Negative.    Cardiovascular: Negative.   Gastrointestinal: Negative.   Endocrine: Negative.   Genitourinary: Negative.   Musculoskeletal: Negative.   Skin: Negative.   Allergic/Immunologic: Negative.   Neurological: Negative.   Hematological: Negative.   Psychiatric/Behavioral:  Positive for dysphoric mood.   All other systems reviewed and are negative.      Objective:   Physical Exam  Awake, alert; appropriate; sitting on table, NAD TTP across low back in band across low back      Assessment & Plan:   Pt is a 56 yr old male with hx of R shoulder pain due to complete supraspinatus tear HTN; no DM; HLD, BPH, on Flomax and Prazosin; and allergies and low testosterone, depression/anxiety on Lorazepam/Cymbalta; ;  Here for f/u of chronic low back pain.    UDS today per clinic policy- last one was appropriate and had substances prescribed- nothing else.   2.   Budesonide- - steroid nasal spray- - I buy mine on - usually can find at [facility]- 1-2 sprays 2x/day- - can also try Flonase sensimist- supposed to have less going down throat/bad taste.    3. Refill Percocet 5/325 mg BID prn- #60-    4. Con't Duloxetine 60 mg daily- con't- per Dr  5. Change Flexeril to Robaxin/Methocarbamol- 500 mg 3x/day as needed for muscle spasms- #60- 5 refills.    6. F/U in 3 months- on chronic low back pain.    I spent a total of 21   minutes on total care today- >50% coordination of care- due to discussion of options for nasal stuffiness and pain control/muscle spasms.

## 2022-06-20 NOTE — Telephone Encounter (Signed)
Methocarbamal not covered. Cyclobenaprine or Tizanidine is preferred.

## 2022-06-20 NOTE — Patient Instructions (Signed)
Pt is a 56 yr old male with hx of R shoulder pain due to complete supraspinatus tear HTN; no DM; HLD, BPH, on Flomax and Prazosin; and allergies and low testosterone, depression/anxiety on Lorazepam/Cymbalta; ;  Here for f/u of chronic low back pain.    UDS today per clinic policy- last one was appropriate and had substances prescribed- nothing else.   2.  Budesonide- - steroid nasal spray- - I buy mine on [employer]- usually can [facility]- 1-2 sprays 2x/day- - can also try Flonase sensimist- supposed to have less going down throat/bad taste.    3. Refill Percocet 5/325 mg BID prn- #60-    4. Con't Duloxetine 60 mg daily- con't- per Dr Jenny Reichmann  5. Change Flexeril to Robaxin/Methocarbamol- 500 mg 3x/day as needed for muscle spasms- #60- 5 refills.    6. F/U in 8months- on chronic low back pain.

## 2022-06-23 DIAGNOSIS — E079 Disorder of thyroid, unspecified: Secondary | ICD-10-CM | POA: Diagnosis not present

## 2022-06-23 DIAGNOSIS — E291 Testicular hypofunction: Secondary | ICD-10-CM | POA: Diagnosis not present

## 2022-06-25 NOTE — Telephone Encounter (Signed)
Flexeril knocks him out and Zanaflex will do the same- is there any way to get the Methocarbamol- like prior auth?- thanks- ML

## 2022-06-27 LAB — TOXASSURE SELECT,+ANTIDEPR,UR

## 2022-06-30 DIAGNOSIS — E291 Testicular hypofunction: Secondary | ICD-10-CM | POA: Diagnosis not present

## 2022-07-02 ENCOUNTER — Telehealth: Payer: Self-pay | Admitting: *Deleted

## 2022-07-02 NOTE — Telephone Encounter (Signed)
Urine drug screen for this encounter is consistent for prescribed medication 

## 2022-07-10 ENCOUNTER — Other Ambulatory Visit (INDEPENDENT_AMBULATORY_CARE_PROVIDER_SITE_OTHER): Payer: Medicare HMO

## 2022-07-10 DIAGNOSIS — R739 Hyperglycemia, unspecified: Secondary | ICD-10-CM | POA: Diagnosis not present

## 2022-07-10 DIAGNOSIS — E291 Testicular hypofunction: Secondary | ICD-10-CM

## 2022-07-10 DIAGNOSIS — I10 Essential (primary) hypertension: Secondary | ICD-10-CM

## 2022-07-10 LAB — HEPATIC FUNCTION PANEL
ALT: 34 U/L (ref 0–53)
AST: 20 U/L (ref 0–37)
Albumin: 4.2 g/dL (ref 3.5–5.2)
Alkaline Phosphatase: 41 U/L (ref 39–117)
Bilirubin, Direct: 0.1 mg/dL (ref 0.0–0.3)
Total Bilirubin: 0.5 mg/dL (ref 0.2–1.2)
Total Protein: 6.7 g/dL (ref 6.0–8.3)

## 2022-07-10 LAB — LIPID PANEL
Cholesterol: 126 mg/dL (ref 0–200)
HDL: 42.5 mg/dL (ref >39.00)
LDL Cholesterol: 70 mg/dL (ref 0–99)
NonHDL: 83.99
Total CHOL/HDL Ratio: 3
Triglycerides: 69 mg/dL (ref 0.0–149.0)
VLDL: 13.8 mg/dL (ref 0.0–40.0)

## 2022-07-10 LAB — BASIC METABOLIC PANEL
BUN: 21 mg/dL (ref 6–23)
CO2: 31 mEq/L (ref 19–32)
Calcium: 8.8 mg/dL (ref 8.4–10.5)
Chloride: 101 mEq/L (ref 96–112)
Creatinine, Ser: 1.38 mg/dL (ref 0.40–1.50)
GFR: 57.63 mL/min — ABNORMAL LOW (ref >60.00)
Glucose, Bld: 92 mg/dL (ref 70–99)
Potassium: 3.6 mEq/L (ref 3.5–5.1)
Sodium: 138 mEq/L (ref 135–145)

## 2022-07-10 LAB — HEMOGLOBIN A1C: Hgb A1c MFr Bld: 5.9 % (ref 4.6–6.5)

## 2022-07-10 LAB — TESTOSTERONE: Testosterone: 809.39 ng/dL (ref 300.00–890.00)

## 2022-07-14 ENCOUNTER — Ambulatory Visit (INDEPENDENT_AMBULATORY_CARE_PROVIDER_SITE_OTHER): Payer: Medicare HMO | Admitting: Internal Medicine

## 2022-07-14 ENCOUNTER — Encounter: Payer: Self-pay | Admitting: Internal Medicine

## 2022-07-14 VITALS — HR 74 | Temp 97.8°F | Ht 67.0 in | Wt 271.1 lb

## 2022-07-14 DIAGNOSIS — F411 Generalized anxiety disorder: Secondary | ICD-10-CM

## 2022-07-14 DIAGNOSIS — E7800 Pure hypercholesterolemia, unspecified: Secondary | ICD-10-CM | POA: Diagnosis not present

## 2022-07-14 DIAGNOSIS — E559 Vitamin D deficiency, unspecified: Secondary | ICD-10-CM

## 2022-07-14 DIAGNOSIS — I10 Essential (primary) hypertension: Secondary | ICD-10-CM

## 2022-07-14 DIAGNOSIS — R739 Hyperglycemia, unspecified: Secondary | ICD-10-CM | POA: Diagnosis not present

## 2022-07-14 DIAGNOSIS — Z23 Encounter for immunization: Secondary | ICD-10-CM

## 2022-07-14 DIAGNOSIS — Z125 Encounter for screening for malignant neoplasm of prostate: Secondary | ICD-10-CM

## 2022-07-14 DIAGNOSIS — E538 Deficiency of other specified B group vitamins: Secondary | ICD-10-CM

## 2022-07-14 DIAGNOSIS — R69 Illness, unspecified: Secondary | ICD-10-CM | POA: Diagnosis not present

## 2022-07-14 MED ORDER — PRAVASTATIN SODIUM 80 MG PO TABS: 80.0000 mg | ORAL_TABLET | Freq: Every day | ORAL | 3 refills | Status: DC

## 2022-07-14 MED ORDER — LORAZEPAM 1 MG PO TABS: 1.0000 mg | ORAL_TABLET | Freq: Two times a day (BID) | ORAL | 2 refills | Status: DC | PRN

## 2022-07-14 NOTE — Assessment & Plan Note (Signed)
BP Readings from Last 3 Encounters:  06/20/22 122/70  06/13/22 108/66  05/29/22 128/84   Stable, pt to continue medical treatment norvasc 10 mg , lotensin 50 mg qd

## 2022-07-14 NOTE — Assessment & Plan Note (Signed)
Ok for restart lorazepam 1 mg bid prn,  to f/u any worsening symptoms or concerns

## 2022-07-14 NOTE — Patient Instructions (Addendum)
You had the flu shot today  Ok to change the crestor to pravastatin 80 mg per day  Ok to change the lorazepam back to the 1 mg twice per day as needed  Please continue all other medications as before, and refills have been done if requested.  Please have the pharmacy call with any other refills you may need.  Please continue your efforts at being more active, low cholesterol diet, and weight control  Please keep your appointments with your specialists as you may have planned  Please make an Appointment to return in 6 months, or sooner if needed, also with Lab Appointment for testing done 3-5 days before at the FIRST FLOOR Lab (so this is for TWO appointments - please see the scheduling desk as you leave)

## 2022-07-14 NOTE — Progress Notes (Addendum)
Patient ID: Darren Black, male   DOB: [birth year], 56 y.o.   MRN: [id number]        Chief Complaint: follow up HTN, HLD and hyperglycemia, anxiety, CAD       HPI:  Darren Black is a 56 y.o. male here after recently seen and tx per cardiology with try change pravastatin to crestor 40 mg, pt had again a severe reaction and states cannot tolerate, though can take the pravastatin.  Pt denies chest pain, increased sob or doe, wheezing, orthopnea, PND, increased LE swelling, palpitations, dizziness or syncope.   Pt denies polydipsia, polyuria, or new focal neuro s/s.    Pt denies fever, wt loss, night sweats, loss of appetite, or other constitutional symptoms Due for flu shot.  BP has been controlled at home per wife with him.  Also asks to return to the lorazepam 1 mg bid now that this dose is back in stock.   Wt Readings from Last 3 Encounters:  07/14/22 271 lb 2 oz (123 kg)  06/20/22 268 lb (121.6 kg)  06/13/22 268 lb 9.6 oz (121.8 kg)   BP Readings from Last 3 Encounters:  06/20/22 122/70  06/13/22 108/66  05/29/22 128/84         Past Medical History:  Diagnosis Date   Anxiety    BPH (benign prostatic hypertrophy)    Chronic lower back pain    nerve damage w/ leg pain   Depression    Diarrhea 03/23/2019   Family history of adverse reaction to anesthesia    MOTHER--- HARD TO WAKE   History of panic attacks    Hyperlipidemia    Hypertension    Meatal stenosis    OSA on CPAP    moderate to severe OSA  per study 04-28-2007 uses CPAP   PONV (postoperative nausea and vomiting)    Past Surgical History:  Procedure Laterality Date   APPENDECTOMY  2003   BREATH TEK H PYLORI  04/27/2012   Procedure: BREATH TEK H PYLORI;  Surgeon: Valarie Merino, MD;  Location: [facility];  Service: General;  Laterality: N/A;   CHOLECYSTECTOMY N/A 03/24/2019   Procedure: LAPAROSCOPIC CHOLECYSTECTOMY WITH INTRAOPERATIVE CHOLANGIOGRAM;  Surgeon: Manus Rudd, MD;  Location: [facility];  Service: General;   Laterality: N/A;   CYSTOSCOPY WITH URETHRAL DILATATION N/A 04/12/2015   Procedure: CYSTOSCOPY WITH URETHRAL DILATATION, RETROGRADE AND URETHROGRAM WITH BLADDER BIOPSY;  Surgeon: Bjorn Pippin, MD;  Location[facility];  Service: Urology;  Laterality: N/A;   ROTATOR CUFF REPAIR Right 01-21-2012   TRANSTHORACIC ECHOCARDIOGRAM  03-26-2007   normal LVSF, ef 60%,  mild AV calcification without stenosis,  mild MR and TR,  mild LAE   URETHROGRAM N/A 04/12/2015   Procedure: URETHROGRAM;  Surgeon: Bjorn Pippin, MD;  Location: [facility];  Service: Urology;  Laterality: N/A;    reports that he has never smoked. He has never used smokeless tobacco. He reports current alcohol use. He reports that he does not use drugs. family history includes Cancer in his father; Coronary artery disease in his father; Diabetes in an other family member; Hyperlipidemia in an other family member; Hypertension in an other family member; Stroke in an other family member. Allergies  Allergen Reactions   Gabapentin Swelling   Crestor [Rosuvastatin Calcium] Other (See Comments)    abd pain   Current Outpatient Medications on File Prior to Visit  Medication Sig Dispense Refill   Amino Acids (COMPLETE AMINO ACID MIX PO) Take by mouth.  amLODipine (NORVASC) 10 MG tablet Take 1 tablet by mouth once daily 90 tablet 3   aspirin EC 81 MG tablet Take 1 tablet (81 mg total) by mouth daily. Swallow whole. 30 tablet 12   benazepril (LOTENSIN) 40 MG tablet Take 1 tablet (40 mg total) by mouth daily. 90 tablet 3   Cetirizine HCl (KLS ALLER-TEC PO) Take by mouth.     Cholecalciferol (D3 5000 PO) Take by mouth.     Coenzyme Q10 (COQ10) 100 MG CAPS Take 300 mg by mouth.     doxazosin (CARDURA) 4 MG tablet TAKE 1 TABLET BY MOUTH AT BEDTIME 90 tablet 03   DULoxetine (CYMBALTA) 60 MG capsule Take 1 capsule by mouth once daily in the morning 90 capsule 3   ezetimibe (ZETIA) 10 MG tablet Take 1 tablet by mouth once  daily 90 tablet 3   fluticasone (FLONASE) 50 MCG/ACT nasal spray Use 2 spray(s) in each nostril once daily 16 g 0   Ginger, Zingiber officinalis, (GINGER ROOT) 550 MG CAPS Take by mouth.     hydrochlorothiazide (HYDRODIURIL) 25 MG tablet Take 1/2 (one-half) tablet by mouth once daily 45 tablet 3   methocarbamol (ROBAXIN) 500 MG tablet Take 1 tablet (500 mg total) by mouth every 8 (eight) hours as needed for muscle spasms. Takes when spasms real bad- take instead of flexeril 60 tablet 5   MILK THISTLE PO Milk Thistle     Omega 3-6-9 Fatty Acids (TRIPLE OMEGA COMPLEX PO) Take by mouth.     ondansetron (ZOFRAN) 4 MG tablet Take 1 tablet (4 mg total) by mouth every 6 (six) hours. 12 tablet 0   oxyCODONE-acetaminophen (PERCOCET/ROXICET) 5-325 MG tablet Take 1 tablet by mouth 2 (two) times daily as needed for severe pain. 60 tablet 0   potassium chloride (KLOR-CON) 10 MEQ tablet Take 1 tablet (10 mEq total) by mouth daily. 90 tablet 3   Saw Palmetto 1000 MG CAPS Take by mouth.     Sodium Hyaluronate, oral, (HYALURONIC ACID) 100 MG CAPS Take 200 mg by mouth.     tamsulosin (FLOMAX) 0.4 MG CAPS capsule Take 1 capsule by mouth once daily 90 capsule 3   testosterone (ANDROGEL) 50 MG/5GM (1%) GEL Place 5 g onto the skin daily. 450 g 1   Turmeric 500 MG CAPS Take by mouth.     No current facility-administered medications on file prior to visit.        ROS:  All others reviewed and negative.  Objective        PE:  Pulse 74   Temp 97.8 F (36.6 C) (Oral)   Ht 5\' 7"  (1.702 m)   Wt 271 lb 2 oz (123 kg)   SpO2 95%   BMI 42.46 kg/m                 Constitutional: Pt appears in NAD               HENT: Head: NCAT.                Right Ear: External ear normal.                 Left Ear: External ear normal.                Eyes: . Pupils are equal, round, and reactive to light. Conjunctivae and EOM are normal               Nose: without d/c or  deformity               Neck: Neck supple. Gross normal  ROM               Cardiovascular: Normal rate and regular rhythm.                 Pulmonary/Chest: Effort normal and breath sounds without rales or wheezing.                Abd:  Soft, NT, ND, + BS, no organomegaly               Neurological: Pt is alert. At baseline orientation, motor grossly intact               Skin: Skin is warm. No rashes, no other new lesions, LE edema - none               Psychiatric: Pt behavior is normal without agitation   Micro: none  Cardiac tracings I have personally interpreted today:  none  Pertinent Radiological findings (summarize): none   Lab Results  Component Value Date   WBC 10.6 (H) 05/29/2022   HGB 15.9 05/29/2022   HCT 47.8 05/29/2022   PLT 185 05/29/2022   GLUCOSE 92 07/10/2022   CHOL 126 07/10/2022   TRIG 69.0 07/10/2022   HDL 42.50 07/10/2022   LDLDIRECT 139.0 01/02/2016   LDLCALC 70 07/10/2022   ALT 34 07/10/2022   AST 20 07/10/2022   NA 138 07/10/2022   K 3.6 07/10/2022   CL 101 07/10/2022   CREATININE 1.38 07/10/2022   BUN 21 07/10/2022   CO2 31 07/10/2022   TSH 1.74 01/03/2022   PSA 1.07 01/03/2022   HGBA1C 5.9 07/10/2022   MICROALBUR 1.2 07/05/2021   Assessment/Plan:  Darren Black is a 56 y.o. White or Caucasian [1] male with  has a past medical history of Anxiety, BPH (benign prostatic hypertrophy), Chronic lower back pain, Depression, Diarrhea (03/23/2019), Family history of adverse reaction to anesthesia, History of panic attacks, Hyperlipidemia, Hypertension, Meatal stenosis, OSA on CPAP, and PONV (postoperative nausea and vomiting).  Hyperlipidemia Lab Results  Component Value Date   LDLCALC 70 07/10/2022   Uncontrolled, goal ldl < 70,  pt to restart pravastatin 80 mg and zetia 10 mg qd, lower chol diet, consider add nexlitol next if not improving   Hyperglycemia Lab Results  Component Value Date   HGBA1C 5.9 07/10/2022   Stable, pt to continue current medical treatment  - diet, wt control,  excercsie   Anxiety state Ok for restart lorazepam 1 mg bid prn,  to f/u any worsening symptoms or concerns  Essential hypertension BP Readings from Last 3 Encounters:  06/20/22 122/70  06/13/22 108/66  05/29/22 128/84   Stable, pt to continue medical treatment norvasc 10 mg , lotensin 50 mg qd  Followup: Return in about 6 months (around 01/12/2023).  Darren Barre, MD 07/14/2022 8:26 PM [facility]

## 2022-07-14 NOTE — Addendum Note (Signed)
Addended by: Corwin Levins on: 07/14/2022 08:27 PM   Modules accepted: Orders

## 2022-07-14 NOTE — Assessment & Plan Note (Addendum)
Lab Results  Component Value Date   LDLCALC 70 07/10/2022   Uncontrolled, goal ldl < 70,  pt to restart pravastatin 80 mg and zetia 10 mg qd, lower chol diet, consider add nexlitol next if not improving

## 2022-07-14 NOTE — Assessment & Plan Note (Signed)
Lab Results  Component Value Date   HGBA1C 5.9 07/10/2022   Stable, pt to continue current medical treatment  - diet, wt control, excercsie

## 2022-07-16 ENCOUNTER — Other Ambulatory Visit: Payer: Self-pay | Admitting: Internal Medicine

## 2022-07-17 ENCOUNTER — Institutional Professional Consult (permissible substitution): Payer: Medicare HMO | Admitting: Pulmonary Disease

## 2022-07-17 ENCOUNTER — Ambulatory Visit: Payer: Medicare HMO | Admitting: Pulmonary Disease

## 2022-07-17 ENCOUNTER — Encounter: Payer: Self-pay | Admitting: Pulmonary Disease

## 2022-07-17 VITALS — BP 112/80 | HR 67 | Temp 97.8°F | Ht 67.0 in | Wt 269.8 lb

## 2022-07-17 DIAGNOSIS — G4733 Obstructive sleep apnea (adult) (pediatric): Secondary | ICD-10-CM | POA: Diagnosis not present

## 2022-07-17 DIAGNOSIS — E230 Hypopituitarism: Secondary | ICD-10-CM

## 2022-07-17 DIAGNOSIS — I700 Atherosclerosis of aorta: Secondary | ICD-10-CM | POA: Diagnosis not present

## 2022-07-17 NOTE — Progress Notes (Signed)
Darren Black    [id number]    11/28/1965  Primary Care Physician:Laith, Len Blalock, MD  Referring Physician: Corwin Levins, MD [address] [place],  [place] [address]  Chief complaint:   Patient with history of obstructive sleep apnea  HPI:  Patient with known obstructive sleep apnea Compliant with CPAP  Needs new supplies  Continues to benefit from CPAP use and uses it nightly  Obstructive sleep apnea was diagnosed  Records from previous visits with Dr. Vassie Loll was reviewed  Usually goes to bed about 2 AM Takes him about 510 minutes to fall asleep 2 awakenings Final wake up time between 10 and 11 AM  Last sleep study was in 2008  Has been functioning relatively well with no significant shortness of breath or limitations with activities of daily living He does have a history of hypertension, hypercholesterolemia Never smoker  Outpatient Encounter Medications as of 07/17/2022  Medication Sig   Amino Acids (COMPLETE AMINO ACID MIX PO) Take by mouth.   amLODipine (NORVASC) 10 MG tablet Take 1 tablet by mouth once daily   aspirin EC 81 MG tablet Take 1 tablet (81 mg total) by mouth daily. Swallow whole.   benazepril (LOTENSIN) 40 MG tablet Take 1 tablet (40 mg total) by mouth daily.   Cetirizine HCl (KLS ALLER-TEC PO) Take by mouth.   Cholecalciferol (D3 5000 PO) Take by mouth.   Coenzyme Q10 (COQ10) 100 MG CAPS Take 300 mg by mouth.   doxazosin (CARDURA) 4 MG tablet TAKE 1 TABLET BY MOUTH AT BEDTIME   DULoxetine (CYMBALTA) 60 MG capsule Take 1 capsule by mouth once daily in the morning   ezetimibe (ZETIA) 10 MG tablet Take 1 tablet by mouth once daily   fluticasone (FLONASE) 50 MCG/ACT nasal spray Use 2 spray(s) in each nostril once daily   Ginger, Zingiber officinalis, (GINGER ROOT) 550 MG CAPS Take by mouth.   hydrochlorothiazide (HYDRODIURIL) 25 MG tablet Take 1/2 (one-half) tablet by mouth once daily   LORazepam (ATIVAN) 1 MG tablet Take 1 tablet (1 mg  total) by mouth 2 (two) times daily as needed for anxiety.   methocarbamol (ROBAXIN) 500 MG tablet Take 1 tablet (500 mg total) by mouth every 8 (eight) hours as needed for muscle spasms. Takes when spasms real bad- take instead of flexeril   MILK THISTLE PO Milk Thistle   Omega 3-6-9 Fatty Acids (TRIPLE OMEGA COMPLEX PO) Take by mouth.   ondansetron (ZOFRAN) 4 MG tablet Take 1 tablet (4 mg total) by mouth every 6 (six) hours.   oxyCODONE-acetaminophen (PERCOCET/ROXICET) 5-325 MG tablet Take 1 tablet by mouth 2 (two) times daily as needed for severe pain.   potassium chloride (KLOR-CON) 10 MEQ tablet Take 1 tablet (10 mEq total) by mouth daily.   pravastatin (PRAVACHOL) 80 MG tablet Take 1 tablet (80 mg total) by mouth daily.   Saw Palmetto 1000 MG CAPS Take by mouth.   Sodium Hyaluronate, oral, (HYALURONIC ACID) 100 MG CAPS Take 200 mg by mouth.   tamsulosin (FLOMAX) 0.4 MG CAPS capsule Take 1 capsule by mouth once daily   testosterone (ANDROGEL) 50 MG/5GM (1%) GEL Place 5 g onto the skin daily.   Turmeric 500 MG CAPS Take by mouth.   No facility-administered encounter medications on file as of 07/17/2022.    Allergies as of 07/17/2022 - Review Complete 07/17/2022  Allergen Reaction Noted   Gabapentin Swelling 07/29/2021   Crestor [rosuvastatin calcium] Other (See  Comments) 01/17/2019    Past Medical History:  Diagnosis Date   Anxiety    BPH (benign prostatic hypertrophy)    Chronic lower back pain    nerve damage w/ leg pain   Depression    Diarrhea 03/23/2019   Family history of adverse reaction to anesthesia    MOTHER--- HARD TO WAKE   History of panic attacks    Hyperlipidemia    Hypertension    Meatal stenosis    OSA on CPAP    moderate to severe OSA  per study 04-28-2007 uses CPAP   PONV (postoperative nausea and vomiting)     Past Surgical History:  Procedure Laterality Date   APPENDECTOMY  2003   BREATH TEK H PYLORI  04/27/2012   Procedure: BREATH TEK H PYLORI;   Surgeon: Valarie Merino, MD;  Location: [facility];  Service: General;  Laterality: N/A;   CHOLECYSTECTOMY N/A 03/24/2019   Procedure: LAPAROSCOPIC CHOLECYSTECTOMY WITH INTRAOPERATIVE CHOLANGIOGRAM;  Surgeon: Manus Rudd, MD;  Location: [facility];  Service: General;  Laterality: N/A;   CYSTOSCOPY WITH URETHRAL DILATATION N/A 04/12/2015   Procedure: CYSTOSCOPY WITH URETHRAL DILATATION, RETROGRADE AND URETHROGRAM WITH BLADDER BIOPSY;  Surgeon: Bjorn Pippin, MD;  Location: [facility];  Service: Urology;  Laterality: N/A;   ROTATOR CUFF REPAIR Right 01-21-2012   TRANSTHORACIC ECHOCARDIOGRAM  03-26-2007   normal LVSF, ef 60%,  mild AV calcification without stenosis,  mild MR and TR,  mild LAE   URETHROGRAM N/A 04/12/2015   Procedure: URETHROGRAM;  Surgeon: Bjorn Pippin, MD;  Location: [facility];  Service: Urology;  Laterality: N/A;    Family History  Problem Relation Age of Onset   Coronary artery disease Father    Cancer Father        anaplastic thyroid cancer   Stroke Other        1st degree relative   Diabetes Other        1st degree relative   Hypertension Other    Hyperlipidemia Other     Social History   Socioeconomic History   Marital status: Married    Spouse name: Not on file   Number of children: Not on file   Years of education: Not on file   Highest education level: Not on file  Occupational History   Occupation: disabled, LBP and anxiety  Tobacco Use   Smoking status: Never   Smokeless tobacco: Never  Vaping Use   Vaping Use: Never used  Substance and Sexual Activity   Alcohol use: Yes    Comment: rare   Drug use: No   Sexual activity: Not on file  Other Topics Concern   Not on file  Social History Narrative   Not on file   Social Determinants of Health   Financial Resource Strain: Low Risk  (01/22/2022)   Overall Financial Resource Strain (CARDIA)    Difficulty of Paying Living Expenses: Not hard at all  Food Insecurity: No  Food Insecurity (01/22/2022)   Hunger Vital Sign    Worried About Running Out of Food in the Last Year: Never true    Ran Out of Food in the Last Year: Never true  Transportation Needs: No Transportation Needs (01/22/2022)   PRAPARE - Administrator, Civil Service (Medical): No    Lack of Transportation (Non-Medical): No  Physical Activity: Inactive (01/22/2022)   Exercise Vital Sign    Days of Exercise per Week: 0 days    Minutes of Exercise  per Session: 0 min  Stress: No Stress Concern Present (01/22/2022)   [employer] of Occupational Health - Occupational Stress Questionnaire    Feeling of Stress : Not at all  Social Connections: Moderately Isolated (01/22/2022)   Social Connection and Isolation Panel [NHANES]    Frequency of Communication with Friends and Family: More than three times a week    Frequency of Social Gatherings with Friends and Family: Once a week    Attends Religious Services: Never    Database administrator or Organizations: No    Attends Banker Meetings: Never    Marital Status: Married  Catering manager Violence: Not At Risk (01/22/2022)   Humiliation, Afraid, Rape, and Kick questionnaire    Fear of Current or Ex-Partner: No    Emotionally Abused: No    Physically Abused: No    Sexually Abused: No    Review of Systems  Respiratory:  Positive for apnea.   Psychiatric/Behavioral:  Positive for sleep disturbance.     Vitals:   07/17/22 1502  BP: 112/80  Pulse: 67  Temp: 97.8 F (36.6 C)  SpO2: 96%     Physical Exam Constitutional:      Appearance: He is obese.  HENT:     Head: Normocephalic.     Mouth/Throat:     Mouth: Mucous membranes are moist.  Cardiovascular:     Rate and Rhythm: Normal rate and regular rhythm.     Heart sounds: No murmur heard.    No friction rub.  Pulmonary:     Effort: No respiratory distress.     Breath sounds: No stridor. No wheezing.  Musculoskeletal:     Cervical back: No rigidity or  tenderness.  Neurological:     Mental Status: He is alert.  Psychiatric:        Mood and Affect: Mood normal.       07/17/2022    3:00 PM 10/28/2018    2:31 PM  Results of the Epworth flowsheet  Sitting and reading 2 0  Watching TV 2 1  Sitting, inactive in a public place (e.g. a theatre or a meeting) 0 0  As a passenger in a car for an hour without a break 3 3  Lying down to rest in the afternoon when circumstances permit 3 3  Sitting and talking to someone 0 0  Sitting quietly after a lunch without alcohol 2 0  In a car, while stopped for a few minutes in traffic 0 0  Total score 12 7     Data Reviewed: Download from his machine shows 100% compliance Average Hours of 8 hours 34 minutes Set between 5 and 12 95 percentile pressure of 11.5 with maximum pressure of 11.8 Residual AHI 10.2  Assessment:  Severe obstructive sleep apnea on CPAP therapy  Needs new supplies  Class III obesity    Plan/Recommendations: We will send prescription to [facility] for new CPAP supplies  Prescription to [facility] for pressure change change from 5-12 to 5-15  Patient is to give [place] a call if not tolerating pressures  Otherwise we will see him again in about a year  Encouraged to call us with significant concerns  Weight loss efforts encouraged   Virl Diamond MD [facility] 07/17/2022, 3:24 PM  CC: Corwin Levins, MD

## 2022-07-17 NOTE — Patient Instructions (Signed)
DME referral for CPAP supplies-[facility]  Change CPAP settings from 5-12 to 5-15  I will see you back in a year  Call with significant concerns  Continue to use CPAP on a nightly basis

## 2022-07-28 ENCOUNTER — Telehealth: Payer: Self-pay

## 2022-07-28 ENCOUNTER — Other Ambulatory Visit: Payer: Self-pay

## 2022-07-28 DIAGNOSIS — G4733 Obstructive sleep apnea (adult) (pediatric): Secondary | ICD-10-CM | POA: Diagnosis not present

## 2022-07-28 MED ORDER — OXYCODONE-ACETAMINOPHEN 5-325 MG PO TABS: 1.0000 | ORAL_TABLET | Freq: Two times a day (BID) | ORAL | 0 refills | Status: DC | PRN

## 2022-07-28 NOTE — Telephone Encounter (Signed)
Patient requesting refill on oxycodone °

## 2022-08-11 ENCOUNTER — Other Ambulatory Visit: Payer: Self-pay | Admitting: Internal Medicine

## 2022-08-19 DIAGNOSIS — R7303 Prediabetes: Secondary | ICD-10-CM | POA: Diagnosis not present

## 2022-08-19 DIAGNOSIS — Z125 Encounter for screening for malignant neoplasm of prostate: Secondary | ICD-10-CM | POA: Diagnosis not present

## 2022-08-19 DIAGNOSIS — E079 Disorder of thyroid, unspecified: Secondary | ICD-10-CM | POA: Diagnosis not present

## 2022-08-19 DIAGNOSIS — E291 Testicular hypofunction: Secondary | ICD-10-CM | POA: Diagnosis not present

## 2022-08-19 DIAGNOSIS — E349 Endocrine disorder, unspecified: Secondary | ICD-10-CM | POA: Diagnosis not present

## 2022-08-19 DIAGNOSIS — E279 Disorder of adrenal gland, unspecified: Secondary | ICD-10-CM | POA: Diagnosis not present

## 2022-08-19 DIAGNOSIS — R351 Nocturia: Secondary | ICD-10-CM | POA: Diagnosis not present

## 2022-08-19 DIAGNOSIS — Z79899 Other long term (current) drug therapy: Secondary | ICD-10-CM | POA: Diagnosis not present

## 2022-08-21 ENCOUNTER — Telehealth: Payer: Self-pay

## 2022-08-21 MED ORDER — OXYCODONE-ACETAMINOPHEN 5-325 MG PO TABS: 1.0000 | ORAL_TABLET | Freq: Two times a day (BID) | ORAL | 0 refills | Status: DC | PRN

## 2022-08-21 NOTE — Telephone Encounter (Signed)
PMP was Reviewed.  Dr Berline Chough note was reviewed.  Oxycodone e-scribed today.

## 2022-08-21 NOTE — Addendum Note (Signed)
Addended by: Jones Bales on: 08/21/2022 11:10 AM   Modules accepted: Orders

## 2022-08-21 NOTE — Telephone Encounter (Signed)
Patient requesting refill on Oxycodone last fill per PMP was 07/28/22   Filled  Written  ID  Drug  QTY  Days  Prescriber  RX #  Dispenser  Refill  Daily Dose*  Pymt Type  PMP  07/28/2022 07/28/2022 1  Oxycodone-Acetaminophen 5-325 60.00 30 Me [facility] [id number] [facility] ([id number]) 0/0 15.00 MME Medicare [place]

## 2022-09-03 ENCOUNTER — Other Ambulatory Visit: Payer: Self-pay

## 2022-09-03 ENCOUNTER — Other Ambulatory Visit: Payer: Self-pay | Admitting: Internal Medicine

## 2022-09-03 NOTE — Telephone Encounter (Signed)
Please refill as per office routine med refill policy (all routine meds to be refilled for 3 mo or monthly (per pt preference) up to one year from last visit, then month to month grace period for 3 mo, then further med refills will have to be denied) ? ?

## 2022-09-05 DIAGNOSIS — E291 Testicular hypofunction: Secondary | ICD-10-CM | POA: Diagnosis not present

## 2022-09-22 ENCOUNTER — Encounter: Payer: Medicare HMO | Attending: Physical Medicine and Rehabilitation | Admitting: Physical Medicine and Rehabilitation

## 2022-09-22 ENCOUNTER — Encounter: Payer: Self-pay | Admitting: Physical Medicine and Rehabilitation

## 2022-09-22 VITALS — BP 119/81 | HR 71 | Ht 67.0 in | Wt 281.0 lb

## 2022-09-22 DIAGNOSIS — G894 Chronic pain syndrome: Secondary | ICD-10-CM | POA: Diagnosis not present

## 2022-09-22 DIAGNOSIS — G8929 Other chronic pain: Secondary | ICD-10-CM | POA: Diagnosis not present

## 2022-09-22 DIAGNOSIS — M25511 Pain in right shoulder: Secondary | ICD-10-CM | POA: Insufficient documentation

## 2022-09-22 DIAGNOSIS — M4800 Spinal stenosis, site unspecified: Secondary | ICD-10-CM | POA: Diagnosis not present

## 2022-09-22 DIAGNOSIS — M5416 Radiculopathy, lumbar region: Secondary | ICD-10-CM | POA: Diagnosis not present

## 2022-09-22 MED ORDER — OXYCODONE-ACETAMINOPHEN 5-325 MG PO TABS: 1.0000 | ORAL_TABLET | Freq: Two times a day (BID) | ORAL | 0 refills | Status: DC | PRN

## 2022-09-22 NOTE — Progress Notes (Signed)
Subjective:    Patient ID: Darren Black, male    DOB: [birth year], 56 y.o.   MRN: [id number]  HPI  Pt is a 57 yr old male with hx of R shoulder pain due to complete supraspinatus tear HTN; no DM; HLD, BPH, on Flomax and Prazosin; and allergies and low testosterone, depression/anxiety on Lorazepam/Cymbalta; ;  Here for f/u of chronic low back pain.   Nothing different  Hurting more due to weather.  Percocet takes the edge off, but doesn't help "amazingly"- sees no need to try and increase dose.      Pain Inventory Average Pain 9 Pain Right Now 9 My pain is sharp, dull, stabbing, and aching  In the last 24 hours, has pain interfered with the following? General activity 9 Relation with others 8 Enjoyment of life 9 What TIME of day is your pain at its worst? morning , daytime, evening, and night Sleep (in general) Poor  Pain is worse with: walking, bending, sitting, inactivity, standing, and some activites Pain improves with: medication Relief from Meds: 1  Family History  Problem Relation Age of Onset   Coronary artery disease Father    Cancer Father        anaplastic thyroid cancer   Stroke Other        1st degree relative   Diabetes Other        1st degree relative   Hypertension Other    Hyperlipidemia Other    Social History   Socioeconomic History   Marital status: Married    Spouse name: Not on file   Number of children: Not on file   Years of education: Not on file   Highest education level: Not on file  Occupational History   Occupation: disabled, LBP and anxiety  Tobacco Use   Smoking status: Never   Smokeless tobacco: Never  Vaping Use   Vaping Use: Never used  Substance and Sexual Activity   Alcohol use: Yes    Comment: rare   Drug use: No   Sexual activity: Not on file  Other Topics Concern   Not on file  Social History Narrative   Not on file   Social Determinants of Health   Financial Resource Strain: Low Risk  (01/22/2022)    Overall Financial Resource Strain (CARDIA)    Difficulty of Paying Living Expenses: Not hard at all  Food Insecurity: No Food Insecurity (01/22/2022)   Hunger Vital Sign    Worried About Running Out of Food in the Last Year: Never true    Ran Out of Food in the Last Year: Never true  Transportation Needs: No Transportation Needs (01/22/2022)   PRAPARE - Administrator, Civil Service (Medical): No    Lack of Transportation (Non-Medical): No  Physical Activity: Inactive (01/22/2022)   Exercise Vital Sign    Days of Exercise per Week: 0 days    Minutes of Exercise per Session: 0 min  Stress: No Stress Concern Present (01/22/2022)   [employer] of Occupational Health - Occupational Stress Questionnaire    Feeling of Stress : Not at all  Social Connections: Moderately Isolated (01/22/2022)   Social Connection and Isolation Panel [NHANES]    Frequency of Communication with Friends and Family: More than three times a week    Frequency of Social Gatherings with Friends and Family: Once a week    Attends Religious Services: Never    Database administrator or Organizations: No    Attends [employer]  or Organization Meetings: Never    Marital Status: Married   Past Surgical History:  Procedure Laterality Date   APPENDECTOMY  2003   BREATH TEK H PYLORI  04/27/2012   Procedure: BREATH TEK H PYLORI;  Surgeon: Pedro Earls, MD;  Location: [facility];  Service: General;  Laterality: N/A;   CHOLECYSTECTOMY N/A 03/24/2019   Procedure: LAPAROSCOPIC CHOLECYSTECTOMY WITH INTRAOPERATIVE CHOLANGIOGRAM;  Surgeon: Donnie Mesa, MD;  Location: [place];  Service: General;  Laterality: N/A;   CYSTOSCOPY WITH URETHRAL DILATATION N/A 04/12/2015   Procedure: CYSTOSCOPY WITH URETHRAL DILATATION, RETROGRADE AND URETHROGRAM WITH BLADDER BIOPSY;  Surgeon: Irine Seal, MD;  Location: [place];  Service: Urology;  Laterality: N/A;   ROTATOR CUFF REPAIR Right 01-21-2012   TRANSTHORACIC  ECHOCARDIOGRAM  03-26-2007   normal LVSF, ef 60%,  mild AV calcification without stenosis,  mild MR and TR,  mild LAE   URETHROGRAM N/A 04/12/2015   Procedure: URETHROGRAM;  Surgeon: Irine Seal, MD;  Location: [facility];  Service: Urology;  Laterality: N/A;   Past Surgical History:  Procedure Laterality Date   APPENDECTOMY  2003   BREATH TEK H PYLORI  04/27/2012   Procedure: BREATH TEK H PYLORI;  Surgeon: Pedro Earls, MD;  Location: [facility];  Service: General;  Laterality: N/A;   CHOLECYSTECTOMY N/A 03/24/2019   Procedure: LAPAROSCOPIC CHOLECYSTECTOMY WITH INTRAOPERATIVE CHOLANGIOGRAM;  Surgeon: Donnie Mesa, MD;  Location: [place];  Service: General;  Laterality: N/A;   CYSTOSCOPY WITH URETHRAL DILATATION N/A 04/12/2015   Procedure: CYSTOSCOPY WITH URETHRAL DILATATION, RETROGRADE AND URETHROGRAM WITH BLADDER BIOPSY;  Surgeon: Irine Seal, MD;  Location: [place];  Service: Urology;  Laterality: N/A;   ROTATOR CUFF REPAIR Right 01-21-2012   TRANSTHORACIC ECHOCARDIOGRAM  03-26-2007   normal LVSF, ef 60%,  mild AV calcification without stenosis,  mild MR and TR,  mild LAE   URETHROGRAM N/A 04/12/2015   Procedure: URETHROGRAM;  Surgeon: Irine Seal, MD;  Location: [facility];  Service: Urology;  Laterality: N/A;   Past Medical History:  Diagnosis Date   Anxiety    BPH (benign prostatic hypertrophy)    Chronic lower back pain    nerve damage w/ leg pain   Depression    Diarrhea 03/23/2019   Family history of adverse reaction to anesthesia    MOTHER--- HARD TO WAKE   History of panic attacks    Hyperlipidemia    Hypertension    Meatal stenosis    OSA on CPAP    moderate to severe OSA  per study 04-28-2007 uses CPAP   PONV (postoperative nausea and vomiting)    Ht 5\' 7"  (1.702 m)   Wt 281 lb (127.5 kg)   BMI 44.01 kg/m   Opioid Risk Score:   Fall Risk Score:  `1  Depression screen PHQ 2/9     09/22/2022    1:55 PM  07/14/2022    3:01 PM 06/20/2022   11:34 AM 02/21/2022   11:09 AM 01/22/2022    1:30 PM 01/07/2022    3:43 PM 01/07/2022    2:51 PM  Depression screen PHQ 2/9  Decreased Interest 0 0 3 1 1  0 1  Down, Depressed, Hopeless 0 0 3 1 1  0 1  PHQ - 2 Score 0 0 6 2 2  0 2  Altered sleeping  0   0  1  Tired, decreased energy  0   3  3  Change in appetite  0  0  0  Feeling bad or failure about yourself   0   0  1  Trouble concentrating  0   2  2  Moving slowly or fidgety/restless  0   0  0  Suicidal thoughts  0   0  0  PHQ-9 Score  0   7  9  Difficult doing work/chores  Not difficult at all   Not difficult at all        Review of Systems  Musculoskeletal:  Positive for back pain and neck pain.       B/L knee pain  All other systems reviewed and are negative.     Objective:   Physical Exam  Awake, alert, appropriate, accompanied by wife, NAD L shoulder TTP anteriorly- c/w L biceps tendinitis R shoulder TTP throughout secondary to RTC tear s/p repair.  Scar from open surgery of R shoulder  TTP in band across low back.   No LE edema now B/L       Assessment & Plan:   Pt is a 57 yr old male with hx of R shoulder pain due to complete supraspinatus tear- s/p shoulder repair.  HTN; no DM; HLD, BPH, on Flomax and Prazosin; and allergies and low testosterone, depression/anxiety on Lorazepam/Cymbalta; ;  Here for f/u of chronic low back pain.    We discussed going up on pain meds- will wait at this time. Needs refills 5/325 mg 2x/day # 60.   2. Doesn't need refill of Robaxin, but will continue dose- wil call me when needs it.    3. Ortho does injections in shoulders in past. Suggest seeing Orthopedist- in [place]- Dr Berniece Salines? For L shoulder injection.   4. F/U in 3 months- UDS due at next visit.   I spent a total of     22 minutes on total care today- >50% coordination of care- due to d/w pt about UDS, and L shoulder pain/Ortho and discussion about maybe increasing pain meds-  decided against it.

## 2022-09-22 NOTE — Patient Instructions (Signed)
Pt is a 57 yr old male with hx of R shoulder pain due to complete supraspinatus tear- s/p shoulder repair.  HTN; no DM; HLD, BPH, on Flomax and Prazosin; and allergies and low testosterone, depression/anxiety on Lorazepam/Cymbalta; ;  Here for f/u of chronic low back pain.    We discussed going up on pain meds- will wait at this time. Needs refills 5/325 mg 2x/day # 60.   2. Doesn't need refill of Robaxin, but will continue dose- wil call me when needs it.    3. Ortho does injections in shoulders in past. Suggest seeing Orthopedist- in [place]- Dr Berniece Salines? For L shoulder injection.   4. F/U in 3 months- UDS due at next visit.

## 2022-09-24 DIAGNOSIS — L814 Other melanin hyperpigmentation: Secondary | ICD-10-CM | POA: Diagnosis not present

## 2022-09-24 DIAGNOSIS — L821 Other seborrheic keratosis: Secondary | ICD-10-CM | POA: Diagnosis not present

## 2022-09-24 DIAGNOSIS — L718 Other rosacea: Secondary | ICD-10-CM | POA: Diagnosis not present

## 2022-09-24 DIAGNOSIS — D492 Neoplasm of unspecified behavior of bone, soft tissue, and skin: Secondary | ICD-10-CM | POA: Diagnosis not present

## 2022-09-24 DIAGNOSIS — D225 Melanocytic nevi of trunk: Secondary | ICD-10-CM | POA: Diagnosis not present

## 2022-09-24 DIAGNOSIS — D2271 Melanocytic nevi of right lower limb, including hip: Secondary | ICD-10-CM | POA: Diagnosis not present

## 2022-10-02 ENCOUNTER — Other Ambulatory Visit: Payer: Self-pay | Admitting: Internal Medicine

## 2022-10-16 ENCOUNTER — Encounter: Payer: Self-pay | Admitting: Internal Medicine

## 2022-10-16 MED ORDER — ZEPBOUND 2.5 MG/0.5ML ~~LOC~~ SOAJ: 2.5000 mg | SUBCUTANEOUS | 11 refills | Status: DC

## 2022-10-17 ENCOUNTER — Telehealth: Payer: Self-pay

## 2022-10-17 NOTE — Telephone Encounter (Signed)
Pt PA for Zepbound  Key: [id number]

## 2022-10-21 ENCOUNTER — Encounter: Payer: Self-pay | Admitting: Internal Medicine

## 2022-10-21 ENCOUNTER — Encounter (HOSPITAL_COMMUNITY): Payer: Self-pay

## 2022-10-21 ENCOUNTER — Emergency Department (HOSPITAL_COMMUNITY)
Admission: EM | Admit: 2022-10-21 | Discharge: 2022-10-21 | Disposition: A | Discharge status: Home / Self Care | Payer: Medicare HMO | Attending: Emergency Medicine | Admitting: Emergency Medicine

## 2022-10-21 ENCOUNTER — Ambulatory Visit (INDEPENDENT_AMBULATORY_CARE_PROVIDER_SITE_OTHER): Payer: Medicare HMO | Admitting: Internal Medicine

## 2022-10-21 ENCOUNTER — Other Ambulatory Visit: Payer: Self-pay

## 2022-10-21 VITALS — BP 149/93 | HR 88 | Temp 98.3°F | Wt 285.8 lb

## 2022-10-21 DIAGNOSIS — R140 Abdominal distension (gaseous): Secondary | ICD-10-CM | POA: Diagnosis not present

## 2022-10-21 DIAGNOSIS — R339 Retention of urine, unspecified: Secondary | ICD-10-CM | POA: Insufficient documentation

## 2022-10-21 DIAGNOSIS — R300 Dysuria: Secondary | ICD-10-CM

## 2022-10-21 DIAGNOSIS — N401 Enlarged prostate with lower urinary tract symptoms: Secondary | ICD-10-CM | POA: Insufficient documentation

## 2022-10-21 LAB — POC URINALSYSI DIPSTICK (AUTOMATED)
Bilirubin, UA: NEGATIVE
Blood, UA: NEGATIVE
Glucose, UA: NEGATIVE
Ketones, UA: NEGATIVE
Leukocytes, UA: NEGATIVE
Nitrite, UA: NEGATIVE
Protein, UA: POSITIVE — AB
Spec Grav, UA: 1.01 (ref 1.010–1.025)
Urobilinogen, UA: 0.2 E.U./dL
pH, UA: 7 (ref 5.0–8.0)

## 2022-10-21 LAB — URINALYSIS, ROUTINE W REFLEX MICROSCOPIC
Bacteria, UA: NONE SEEN
Bilirubin Urine: NEGATIVE
Glucose, UA: NEGATIVE mg/dL
Hgb urine dipstick: NEGATIVE
Ketones, ur: NEGATIVE mg/dL
Leukocytes,Ua: NEGATIVE
Nitrite: NEGATIVE
Protein, ur: 30 mg/dL — AB
Specific Gravity, Urine: 1.006 (ref 1.005–1.030)
pH: 7 (ref 5.0–8.0)

## 2022-10-21 LAB — CBC WITH DIFFERENTIAL/PLATELET
Abs Immature Granulocytes: 0.03 10*3/uL (ref 0.00–0.07)
Basophils Absolute: 0 10*3/uL (ref 0.0–0.1)
Basophils Relative: 0 %
Eosinophils Absolute: 0.1 10*3/uL (ref 0.0–0.5)
Eosinophils Relative: 1 %
HCT: 50.1 % (ref 39.0–52.0)
Hemoglobin: 16.5 g/dL (ref 13.0–17.0)
Immature Granulocytes: 0 %
Lymphocytes Relative: 9 %
Lymphs Abs: 1 10*3/uL (ref 0.7–4.0)
MCH: 27.1 pg (ref 26.0–34.0)
MCHC: 32.9 g/dL (ref 30.0–36.0)
MCV: 82.4 fL (ref 80.0–100.0)
Monocytes Absolute: 0.7 10*3/uL (ref 0.1–1.0)
Monocytes Relative: 7 %
Neutro Abs: 8.8 10*3/uL — ABNORMAL HIGH (ref 1.7–7.7)
Neutrophils Relative %: 83 %
Platelets: 176 10*3/uL (ref 150–400)
RBC: 6.08 MIL/uL — ABNORMAL HIGH (ref 4.22–5.81)
RDW: 14 % (ref 11.5–15.5)
WBC: 10.7 10*3/uL — ABNORMAL HIGH (ref 4.0–10.5)
nRBC: 0 % (ref 0.0–0.2)

## 2022-10-21 LAB — BASIC METABOLIC PANEL
Anion gap: 8 (ref 5–15)
BUN: 13 mg/dL (ref 6–20)
CO2: 23 mmol/L (ref 22–32)
Calcium: 8.8 mg/dL — ABNORMAL LOW (ref 8.9–10.3)
Chloride: 101 mmol/L (ref 98–111)
Creatinine, Ser: 1.42 mg/dL — ABNORMAL HIGH (ref 0.61–1.24)
GFR, Estimated: 58 mL/min — ABNORMAL LOW (ref >60)
Glucose, Bld: 136 mg/dL — ABNORMAL HIGH (ref 70–99)
Potassium: 3.3 mmol/L — ABNORMAL LOW (ref 3.5–5.1)
Sodium: 132 mmol/L — ABNORMAL LOW (ref 135–145)

## 2022-10-21 MED ORDER — LIDOCAINE HCL URETHRAL/MUCOSAL 2 % EX GEL
1.0000 | Freq: Once | CUTANEOUS | Status: AC
  Administered 2022-10-21: 1 via URETHRAL
  Filled 2022-10-21: qty 11

## 2022-10-21 MED ORDER — CIPROFLOXACIN HCL 500 MG PO TABS: 500.0000 mg | ORAL_TABLET | Freq: Two times a day (BID) | ORAL | 0 refills | Status: AC

## 2022-10-21 NOTE — Progress Notes (Signed)
Established Patient Office Visit     CC/Reason for Visit: Difficulty urinating  HPI: Darren Black is a 57 y.o. male who is coming in today for the above mentioned reasons. Past Medical History is significant for: BPH. Has not been able to urinate any significant amount in close to 36 hours despite drinking about a gallon a day. Has seen urology in the past but admits it has been a few years since last visit.   Past Medical/Surgical History: Past Medical History:  Diagnosis Date   Anxiety    BPH (benign prostatic hypertrophy)    Chronic lower back pain    nerve damage w/ leg pain   Depression    Diarrhea 03/23/2019   Family history of adverse reaction to anesthesia    MOTHER--- HARD TO WAKE   History of panic attacks    Hyperlipidemia    Hypertension    Meatal stenosis    OSA on CPAP    moderate to severe OSA  per study 04-28-2007 uses CPAP   PONV (postoperative nausea and vomiting)     Past Surgical History:  Procedure Laterality Date   APPENDECTOMY  2003   BREATH TEK H PYLORI  04/27/2012   Procedure: BREATH TEK H PYLORI;  Surgeon: Pedro Earls, MD;  Location: [facility];  Service: General;  Laterality: N/A;   CHOLECYSTECTOMY N/A 03/24/2019   Procedure: LAPAROSCOPIC CHOLECYSTECTOMY WITH INTRAOPERATIVE CHOLANGIOGRAM;  Surgeon: Donnie Mesa, MD;  Location: [place];  Service: General;  Laterality: N/A;   CYSTOSCOPY WITH URETHRAL DILATATION N/A 04/12/2015   Procedure: CYSTOSCOPY WITH URETHRAL DILATATION, RETROGRADE AND URETHROGRAM WITH BLADDER BIOPSY;  Surgeon: Irine Seal, MD;  Location: [place];  Service: Urology;  Laterality: N/A;   ROTATOR CUFF REPAIR Right 01-21-2012   TRANSTHORACIC ECHOCARDIOGRAM  03-26-2007   normal LVSF, ef 60%,  mild AV calcification without stenosis,  mild MR and TR,  mild LAE   URETHROGRAM N/A 04/12/2015   Procedure: URETHROGRAM;  Surgeon: Irine Seal, MD;  Location: [facility];  Service: Urology;   Laterality: N/A;    Social History:  reports that he has never smoked. He has never used smokeless tobacco. He reports current alcohol use. He reports that he does not use drugs.  Allergies: Allergies  Allergen Reactions   Gabapentin Swelling   Crestor [Rosuvastatin Calcium] Other (See Comments)    abd pain    Family History:  Family History  Problem Relation Age of Onset   Coronary artery disease Father    Cancer Father        anaplastic thyroid cancer   Stroke Other        1st degree relative   Diabetes Other        1st degree relative   Hypertension Other    Hyperlipidemia Other      Current Outpatient Medications:    Amino Acids (COMPLETE AMINO ACID MIX PO), Take by mouth., Disp: , Rfl:    amLODipine (NORVASC) 10 MG tablet, Take 1 tablet by mouth once daily, Disp: 90 tablet, Rfl: 3   anastrozole (ARIMIDEX) 1 MG tablet, Take 1 mg by mouth once a week., Disp: , Rfl:    aspirin EC 81 MG tablet, Take 1 tablet (81 mg total) by mouth daily. Swallow whole., Disp: 30 tablet, Rfl: 12   benazepril (LOTENSIN) 40 MG tablet, Take 1 tablet (40 mg total) by mouth daily., Disp: 90 tablet, Rfl: 3   Cetirizine HCl (KLS ALLER-TEC PO), Take by  mouth., Disp: , Rfl:    Cholecalciferol (D3 5000 PO), Take by mouth., Disp: , Rfl:    Coenzyme Q10 (COQ10) 100 MG CAPS, Take 300 mg by mouth., Disp: , Rfl:    doxazosin (CARDURA) 4 MG tablet, TAKE 1 TABLET BY MOUTH AT BEDTIME, Disp: 90 tablet, Rfl: 03   DULoxetine (CYMBALTA) 60 MG capsule, Take 1 capsule by mouth once daily in the morning, Disp: 90 capsule, Rfl: 3   ezetimibe (ZETIA) 10 MG tablet, Take 1 tablet by mouth once daily, Disp: 90 tablet, Rfl: 1   fluticasone (FLONASE) 50 MCG/ACT nasal spray, Use 2 spray(s) in each nostril once daily, Disp: 16 g, Rfl: 0   Ginger, Zingiber officinalis, (GINGER ROOT) 550 MG CAPS, Take by mouth., Disp: , Rfl:    hydrochlorothiazide (HYDRODIURIL) 25 MG tablet, Take 1/2 (one-half) tablet by mouth once daily,  Disp: 45 tablet, Rfl: 3   LORazepam (ATIVAN) 1 MG tablet, Take 1 tablet by mouth twice daily as needed for anxiety, Disp: 60 tablet, Rfl: 2   MILK THISTLE PO, Milk Thistle, Disp: , Rfl:    Omega 3-6-9 Fatty Acids (TRIPLE OMEGA COMPLEX PO), Take by mouth., Disp: , Rfl:    oxyCODONE-acetaminophen (PERCOCET/ROXICET) 5-325 MG tablet, Take 1 tablet by mouth 2 (two) times daily as needed for severe pain., Disp: 60 tablet, Rfl: 0   potassium chloride (KLOR-CON) 10 MEQ tablet, Take 1 tablet (10 mEq total) by mouth daily., Disp: 90 tablet, Rfl: 3   pravastatin (PRAVACHOL) 80 MG tablet, Take 1 tablet (80 mg total) by mouth daily., Disp: 90 tablet, Rfl: 3   Saw Palmetto 1000 MG CAPS, Take by mouth., Disp: , Rfl:    Sodium Hyaluronate, oral, (HYALURONIC ACID) 100 MG CAPS, Take 200 mg by mouth., Disp: , Rfl:    tamsulosin (FLOMAX) 0.4 MG CAPS capsule, Take 1 capsule by mouth once daily, Disp: 90 capsule, Rfl: 0   testosterone cypionate (DEPOTESTOSTERONE CYPIONATE) 200 MG/ML injection, Inject 200 mg into the muscle once a week., Disp: , Rfl:    tirzepatide (ZEPBOUND) 2.5 MG/0.5ML Pen, Inject 2.5 mg into the skin once a week. R73.03 prediabetes, Disp: 2 mL, Rfl: 11   Turmeric 500 MG CAPS, Take by mouth., Disp: , Rfl:   Review of Systems:  Negative unless indicated in HPI.   Physical Exam: Vitals:   10/21/22 1539 10/21/22 1543  BP: (!) 148/100 (!) 149/93  Pulse: 88   Temp: 98.3 F (36.8 C)   TempSrc: Oral   SpO2: 96%   Weight: 285 lb 12.8 oz (129.6 kg)     Body mass index is 44.76 kg/m.   Physical Exam Vitals reviewed.  Constitutional:      Appearance: Normal appearance.  HENT:     Head: Normocephalic and atraumatic.  Eyes:     Conjunctiva/sclera: Conjunctivae normal.     Pupils: Pupils are equal, round, and reactive to light.  Skin:    General: Skin is warm and dry.  Neurological:     General: No focal deficit present.     Mental Status: He is alert and oriented to person, place, and  time.  Psychiatric:        Mood and Affect: Mood normal.        Behavior: Behavior normal.        Thought Content: Thought content normal.        Judgment: Judgment normal.      Impression and Plan:  Dysuria - Plan: POCT Urinalysis Dipstick (Automated)  Urinary  retention  -In office urine dipstick is negative. -Already on tamsulosin and cardura. -Believe he needs a foley catheter as he has not been able to urinate in close to 36 hours and is uncomfortable. Have advised he go to urology office now or ED.  Time spent:22 minutes reviewing chart, interviewing and examining patient and formulating plan of care.     Lelon Frohlich, MD [facility]

## 2022-10-21 NOTE — Telephone Encounter (Signed)
Pt is having trouble with urination and experiencing dysuria. He is requesting you send in an antibiotic, please advise

## 2022-10-21 NOTE — ED Triage Notes (Signed)
Patient said he has only been trickling his urine. Last time he fully emptied his bladder was 2 days ago. Burns to urinate. History of enlarged prostate.

## 2022-10-21 NOTE — ED Provider Notes (Signed)
[facility] Provider Note   CSN: [id number] Arrival date & time: 10/21/22  1637     History Chief Complaint  Patient presents with   Urinary Retention    HPI Darren Black is a 57 y.o. male presenting for chief complaint of urinary retention.  He is a 57 year old male with history of BPH on Flomax at baseline.  He had a history of similar episode 3 years ago that resolved on initiation of Flomax.  However over the last 48 hours has been unable to void.  When he tries he has small amount of urination.  He is having abdominal distention and discomfort.  Denies fevers chills nausea vomiting syncope shortness of breath..   Patient's recorded medical, surgical, social, medication list and allergies were reviewed in the Snapshot window as part of the initial history.   Review of Systems   Review of Systems  Constitutional:  Negative for chills and fever.  HENT:  Negative for ear pain and sore throat.   Eyes:  Negative for pain and visual disturbance.  Respiratory:  Negative for cough and shortness of breath.   Cardiovascular:  Negative for chest pain and palpitations.  Gastrointestinal:  Positive for abdominal pain. Negative for vomiting.  Genitourinary:  Positive for difficulty urinating. Negative for dysuria and hematuria.  Musculoskeletal:  Negative for arthralgias and back pain.  Skin:  Negative for color change and rash.  Neurological:  Negative for seizures and syncope.  All other systems reviewed and are negative.   Physical Exam Updated Vital Signs BP (!) 174/98 (BP Location: Right Arm)   Pulse 89   Temp 98.4 F (36.9 C) (Oral)   Resp 20   Ht 5' 7"$  (1.702 m)   Wt 127 kg   SpO2 98%   BMI 43.85 kg/m  Physical Exam Vitals and nursing note reviewed.  Constitutional:      General: He is not in acute distress.    Appearance: He is well-developed.  HENT:     Head: Normocephalic and atraumatic.  Eyes:      Conjunctiva/sclera: Conjunctivae normal.  Cardiovascular:     Rate and Rhythm: Normal rate and regular rhythm.     Heart sounds: No murmur heard. Pulmonary:     Effort: Pulmonary effort is normal. No respiratory distress.     Breath sounds: Normal breath sounds.  Abdominal:     General: There is distension.     Palpations: Abdomen is soft.     Tenderness: There is no abdominal tenderness.  Musculoskeletal:        General: No swelling.     Cervical back: Neck supple.  Skin:    General: Skin is warm and dry.     Capillary Refill: Capillary refill takes less than 2 seconds.  Neurological:     Mental Status: He is alert.  Psychiatric:        Mood and Affect: Mood normal.      ED Course/ Medical Decision Making/ A&P    Procedures Ultrasound ED Renal  Date/Time: 10/21/2022 5:06 PM  Performed by: Tretha Sciara, MD Authorized by: Tretha Sciara, MD   Procedure details:    Indications: urinary retention     Technique:  CompleteImages: archivedStudy Limitations: body habitus    Medications Ordered in ED Medications  lidocaine (XYLOCAINE) 2 % jelly 1 Application (1 Application Urethral Given 10/21/22 1720)    Medical Decision Making:    Darren Black is a 57 y.o. male who presented to  the ED today with urinary retention detailed above.     Patient's presentation is complicated by their history of obesity, history of BPH on Flomax.  Patient placed on continuous vitals and telemetry monitoring while in ED which was reviewed periodically.   Complete initial physical exam performed, notably the patient  was hemodynamically stable in no acute distress.  Point-of-care ultrasound was done as above that demonstrates urinary retention of 900 cc postvoid residual. No evidence of hydronephrosis on renal images.  He does have left-sided renal cysts patient is aware of has been measured independently..      Reviewed and confirmed nursing documentation for past medical history,  family history, social history.    Initial Assessment:   Patient history present on his physicals and findings most consistent with recurrent urinary retention in the setting of history of BPH.  Will trial Foley catheterization in the emergency room and is perform screening labs to evaluate for nephropathy.  Reassessment: After catheterization, symptoms are grossly improved.  Disposition:  I have considered need for hospitalization, however, considering all of the above, I believe this patient is stable for discharge at this time.  Patient/family educated about specific return precautions for given chief complaint and symptoms.  Patient/family educated about follow-up with PCP/Uro tomorrow.     Patient/family expressed understanding of return precautions and need for follow-up. Patient spoken to regarding all imaging and laboratory results and appropriate follow up for these results. All education provided in verbal form with additional information in written form. Time was allowed for answering of patient questions. Patient discharged.    Emergency Department Medication Summary:   Medications  lidocaine (XYLOCAINE) 2 % jelly 1 Application (1 Application Urethral Given 10/21/22 1720)         Clinical Impression:  1. Urinary retention      Discharge   Final Clinical Impression(s) / ED Diagnoses Final diagnoses:  Urinary retention    Rx / DC Orders ED Discharge Orders     None         Tretha Sciara, MD 10/21/22 [phone]

## 2022-10-21 NOTE — Telephone Encounter (Signed)
Zepbound denied. See media for full report.

## 2022-10-22 DIAGNOSIS — R338 Other retention of urine: Secondary | ICD-10-CM | POA: Diagnosis not present

## 2022-10-27 ENCOUNTER — Telehealth: Payer: Self-pay | Admitting: *Deleted

## 2022-10-27 NOTE — Telephone Encounter (Signed)
Darren Black is requesting a refill on his oxycodone.

## 2022-10-28 ENCOUNTER — Telehealth: Payer: Self-pay | Admitting: *Deleted

## 2022-10-28 MED ORDER — OXYCODONE-ACETAMINOPHEN 5-325 MG PO TABS: 1.0000 | ORAL_TABLET | Freq: Two times a day (BID) | ORAL | 0 refills | Status: DC | PRN

## 2022-10-28 NOTE — Telephone Encounter (Signed)
        Patient  visited [facility] on 10/21/2022  for treatment    Telephone encounter attempt :  1  A HIPAA compliant voice message was left requesting a return call.  Instructed patient to call back at [phone].  [place] [phone] [address] , [place] [address] Email : [email or web address]

## 2022-10-29 DIAGNOSIS — G4733 Obstructive sleep apnea (adult) (pediatric): Secondary | ICD-10-CM | POA: Diagnosis not present

## 2022-11-03 DIAGNOSIS — R3914 Feeling of incomplete bladder emptying: Secondary | ICD-10-CM | POA: Diagnosis not present

## 2022-11-03 DIAGNOSIS — R338 Other retention of urine: Secondary | ICD-10-CM | POA: Diagnosis not present

## 2022-11-16 ENCOUNTER — Encounter: Payer: Self-pay | Admitting: Internal Medicine

## 2022-11-24 ENCOUNTER — Telehealth: Payer: Self-pay | Admitting: *Deleted

## 2022-11-24 MED ORDER — OXYCODONE-ACETAMINOPHEN 5-325 MG PO TABS: 1.0000 | ORAL_TABLET | Freq: Two times a day (BID) | ORAL | 0 refills | Status: DC | PRN

## 2022-11-24 NOTE — Telephone Encounter (Signed)
noted 

## 2022-11-24 NOTE — Telephone Encounter (Signed)
Patient requesting refill on Oxycodone. His last refill 10/28/22 [facility]

## 2022-11-26 DIAGNOSIS — E291 Testicular hypofunction: Secondary | ICD-10-CM | POA: Diagnosis not present

## 2022-11-26 DIAGNOSIS — E079 Disorder of thyroid, unspecified: Secondary | ICD-10-CM | POA: Diagnosis not present

## 2022-12-05 ENCOUNTER — Other Ambulatory Visit: Payer: Self-pay | Admitting: Internal Medicine

## 2022-12-05 DIAGNOSIS — E291 Testicular hypofunction: Secondary | ICD-10-CM | POA: Diagnosis not present

## 2022-12-15 DIAGNOSIS — R3914 Feeling of incomplete bladder emptying: Secondary | ICD-10-CM | POA: Diagnosis not present

## 2022-12-15 DIAGNOSIS — N401 Enlarged prostate with lower urinary tract symptoms: Secondary | ICD-10-CM | POA: Diagnosis not present

## 2022-12-15 DIAGNOSIS — N390 Urinary tract infection, site not specified: Secondary | ICD-10-CM | POA: Diagnosis not present

## 2022-12-15 DIAGNOSIS — N35011 Post-traumatic bulbous urethral stricture: Secondary | ICD-10-CM | POA: Diagnosis not present

## 2022-12-15 DIAGNOSIS — N323 Diverticulum of bladder: Secondary | ICD-10-CM | POA: Diagnosis not present

## 2022-12-16 ENCOUNTER — Telehealth: Payer: Self-pay

## 2022-12-16 NOTE — Telephone Encounter (Signed)
Called patient to schedule Medicare Annual Wellness Visit (AWV). Unable to reach patient.  Last date of AWV: 01/22/22  Please schedule an appointment at any time on Annual Wellness Schedule.

## 2022-12-21 ENCOUNTER — Other Ambulatory Visit: Payer: Self-pay | Admitting: Internal Medicine

## 2022-12-22 ENCOUNTER — Encounter: Payer: Self-pay | Admitting: Physical Medicine and Rehabilitation

## 2022-12-22 ENCOUNTER — Encounter: Payer: Medicare HMO | Attending: Physical Medicine and Rehabilitation | Admitting: Physical Medicine and Rehabilitation

## 2022-12-22 VITALS — BP 113/79 | HR 76 | Ht 67.0 in | Wt 285.6 lb

## 2022-12-22 DIAGNOSIS — Z5181 Encounter for therapeutic drug level monitoring: Secondary | ICD-10-CM | POA: Diagnosis not present

## 2022-12-22 DIAGNOSIS — Z79891 Long term (current) use of opiate analgesic: Secondary | ICD-10-CM | POA: Diagnosis not present

## 2022-12-22 DIAGNOSIS — G894 Chronic pain syndrome: Secondary | ICD-10-CM | POA: Diagnosis not present

## 2022-12-22 MED ORDER — OXYCODONE-ACETAMINOPHEN 5-325 MG PO TABS: 1.0000 | ORAL_TABLET | Freq: Three times a day (TID) | ORAL | 0 refills | Status: DC | PRN

## 2022-12-22 NOTE — Progress Notes (Signed)
Subjective:    Patient ID: Darren Black, male    DOB: May 27, 1966, 57 y.o.   MRN: [id number]       Subjective  Patient ID: Darren Black, male    DOB: Dec 29, 1965, 57 y.o.   MRN: [id number]   HPI   Pt is a 57 yr old male with hx of R shoulder pain due to complete supraspinatus tear HTN; no DM; HLD, BPH, on Flomax and Prazosin; and allergies and low testosterone, depression/anxiety on Lorazepam/Cymbalta; ;  Here for f/u of chronic low back pain.     Things pretty much the same.   Got an ABX- UTI-  He stopped peeing- and went to ED- put a foley in- for 2 weeks-  In excruciating pain.   Went to see urologist last week-  Came back that had UTI.   Had been told that his bladder neck just closed.  Had 900 cc in bladder when was cathed.  Bladder has a pouch on the side.  And urine goes into pouch.   Already has appendix/gallbladder already.  Possibly Planned to take out "pouch" of bladder.    Regular back and knee pain- usually hurting about the same.   But since given this ABX- has ached all over- doesn't remember the name of medicine was given. "Frequently occurs for him".   Gets aching, tired, etc.   Needs refill on Percocet. Going to fly out to [place]- daughter just had baby- preemie- 32 weeks. 4 lbs 10 oz. Off CPAP machine as of yesterday.     Pain Inventory Average Pain 9 Pain Right Now 9 My pain is sharp, dull, stabbing, and aching  In the last 24 hours, has pain interfered with the following? General activity 9 Relation with others 8 Enjoyment of life 9 What TIME of day is your pain at its worst? daytime, evening, and night Sleep (in general) Poor  Pain is worse with: walking, bending, sitting, standing, and some activites Pain improves with: medication Relief from Meds: 1  Family History  Problem Relation Age of Onset   Coronary artery disease Father    Cancer Father        anaplastic thyroid cancer   Stroke Other        1st degree relative   Diabetes  Other        1st degree relative   Hypertension Other    Hyperlipidemia Other    Social History   Socioeconomic History   Marital status: Married    Spouse name: Not on file   Number of children: Not on file   Years of education: Not on file   Highest education level: Not on file  Occupational History   Occupation: disabled, LBP and anxiety  Tobacco Use   Smoking status: Never   Smokeless tobacco: Never  Vaping Use   Vaping Use: Never used  Substance and Sexual Activity   Alcohol use: Yes    Comment: rare   Drug use: No   Sexual activity: Not on file  Other Topics Concern   Not on file  Social History Narrative   Not on file   Social Determinants of Health   Financial Resource Strain: Low Risk  (01/22/2022)   Overall Financial Resource Strain (CARDIA)    Difficulty of Paying Living Expenses: Not hard at all  Food Insecurity: No Food Insecurity (01/22/2022)   Hunger Vital Sign    Worried About Running Out of Food in the Last Year: Never true  Ran Out of Food in the Last Year: Never true  Transportation Needs: No Transportation Needs (01/22/2022)   PRAPARE - Administrator, Civil Service (Medical): No    Lack of Transportation (Non-Medical): No  Physical Activity: Inactive (01/22/2022)   Exercise Vital Sign    Days of Exercise per Week: 0 days    Minutes of Exercise per Session: 0 min  Stress: No Stress Concern Present (01/22/2022)   [employer] of Occupational Health - Occupational Stress Questionnaire    Feeling of Stress : Not at all  Social Connections: Moderately Isolated (01/22/2022)   Social Connection and Isolation Panel [NHANES]    Frequency of Communication with Friends and Family: More than three times a week    Frequency of Social Gatherings with Friends and Family: Once a week    Attends Religious Services: Never    Database administrator or Organizations: No    Attends Engineer, structural: Never    Marital Status: Married    Past Surgical History:  Procedure Laterality Date   APPENDECTOMY  2003   BREATH Nelda Bucks  04/27/2012   Procedure: BREATH TEK H PYLORI;  Surgeon: Valarie Merino, MD;  Location: [facility];  Service: General;  Laterality: N/A;   CHOLECYSTECTOMY N/A 03/24/2019   Procedure: LAPAROSCOPIC CHOLECYSTECTOMY WITH INTRAOPERATIVE CHOLANGIOGRAM;  Surgeon: Manus Rudd, MD;  Location: [facility];  Service: General;  Laterality: N/A;   CYSTOSCOPY WITH URETHRAL DILATATION N/A 04/12/2015   Procedure: CYSTOSCOPY WITH URETHRAL DILATATION, RETROGRADE AND URETHROGRAM WITH BLADDER BIOPSY;  Surgeon: Bjorn Pippin, MD;  Location: [facility];  Service: Urology;  Laterality: N/A;   ROTATOR CUFF REPAIR Right 01-21-2012   TRANSTHORACIC ECHOCARDIOGRAM  03-26-2007   normal LVSF, ef 60%,  mild AV calcification without stenosis,  mild MR and TR,  mild LAE   URETHROGRAM N/A 04/12/2015   Procedure: URETHROGRAM;  Surgeon: Bjorn Pippin, MD;  Location: [facility];  Service: Urology;  Laterality: N/A;   Past Surgical History:  Procedure Laterality Date   APPENDECTOMY  2003   BREATH TEK H PYLORI  04/27/2012   Procedure: BREATH TEK H PYLORI;  Surgeon: Valarie Merino, MD;  Location: [facility];  Service: General;  Laterality: N/A;   CHOLECYSTECTOMY N/A 03/24/2019   Procedure: LAPAROSCOPIC CHOLECYSTECTOMY WITH INTRAOPERATIVE CHOLANGIOGRAM;  Surgeon: Manus Rudd, MD;  Location: [facility];  Service: General;  Laterality: N/A;   CYSTOSCOPY WITH URETHRAL DILATATION N/A 04/12/2015   Procedure: CYSTOSCOPY WITH URETHRAL DILATATION, RETROGRADE AND URETHROGRAM WITH BLADDER BIOPSY;  Surgeon: Bjorn Pippin, MD;  Location: [facility];  Service: Urology;  Laterality: N/A;   ROTATOR CUFF REPAIR Right 01-21-2012   TRANSTHORACIC ECHOCARDIOGRAM  03-26-2007   normal LVSF, ef 60%,  mild AV calcification without stenosis,  mild MR and TR,  mild LAE   URETHROGRAM N/A 04/12/2015   Procedure: URETHROGRAM;   Surgeon: Bjorn Pippin, MD;  Location: [facility];  Service: Urology;  Laterality: N/A;   Past Medical History:  Diagnosis Date   Anxiety    BPH (benign prostatic hypertrophy)    Chronic lower back pain    nerve damage w/ leg pain   Depression    Diarrhea 03/23/2019   Family history of adverse reaction to anesthesia    MOTHER--- HARD TO WAKE   History of panic attacks    Hyperlipidemia    Hypertension    Meatal stenosis    OSA on CPAP    moderate  to severe OSA  per study 04-28-2007 uses CPAP   PONV (postoperative nausea and vomiting)    BP 113/79   Pulse 76   Ht  (1.702 m)   Wt 285 lb 9.6 oz (129.5 kg)   SpO2 93%   BMI 44.73 kg/m   Opioid Risk Score:   Fall Risk Score:  `1  Depression screen PHQ 2/9     09/22/2022    1:55 PM 07/14/2022    3:01 PM 06/20/2022   11:34 AM 02/21/2022   11:09 AM 01/22/2022    1:30 PM 01/07/2022    3:43 PM 01/07/2022    2:51 PM  Depression screen PHQ 2/9  Decreased Interest 0 0 0 1  Down, Depressed, Hopeless 0 0 0 1  PHQ - 2 Score 0 0 0 2  Altered sleeping  0   0  1  Tired, decreased energy  0   3  3  Change in appetite  0   0  0  Feeling bad or failure about yourself   0   0  1  Trouble concentrating  0   2  2  Moving slowly or fidgety/restless  0   0  0  Suicidal thoughts  0   0  0  PHQ-9 Score  0   7  9  Difficult doing work/chores  Not difficult at all   Not difficult at all       Review of Systems  Musculoskeletal:  Positive for back pain.       Bilateral knee pain   All other systems reviewed and are negative.     Objective:   Physical Exam  Awake, alert, appropriate, no assistive device, NAD accompanied by wife TTP in band across low back No knee effusion B/L -  No knee swelling either TTP across entire knee B/L        Assessment & Plan:      Pt is a 57 yr old male with hx of R shoulder pain due to complete supraspinatus tear HTN; no DM; HLD, BPH, on Flomax and Prazosin; and  allergies and low testosterone, depression/anxiety on Lorazepam/Cymbalta; ;  Here for f/u of chronic low back pain.   Cymbalta/Duloxetine and Lorazepam via Dr Jonny Ruiz  2.  Con't Percocet- will increase Perocet slightly to 5/325 mg BID plus another 1/day as needed with 70 pills for next month and can refill new Rx a little earlier.  As of 12/23/22  3. Will have pt see Riley Lam, since pretty stable with pain meds- q3 months and me in 6 months.   4. Con't  Robaxin as needed   I spent a total of  21   minutes on total care today- >50% coordination of care- due to  Discussing options of pain control- and d/w pt and wife.

## 2022-12-22 NOTE — Patient Instructions (Signed)
Pt is a 57 yr old male with hx of R shoulder pain due to complete supraspinatus tear HTN; no DM; HLD, BPH, on Flomax and Prazosin; and allergies and low testosterone, depression/anxiety on Lorazepam/Cymbalta; ;  Here for f/u of chronic low back pain.   Cymbalta/Duloxetine and Lorazepam via Dr Jonny Ruiz  2.  Con't Percocet- will increase Perocet slightly to 5/325 mg BID plus another 1/day as needed with 70 pills for next month and can refill new Rx a little earlier.  As of 12/23/22  3. Will have pt see Riley Lam, since pretty stable with pain meds- q3 months and me in 6 months.   4. Con't  Robaxin as needed

## 2022-12-23 ENCOUNTER — Other Ambulatory Visit: Payer: Self-pay | Admitting: Internal Medicine

## 2022-12-24 LAB — TOXASSURE SELECT,+ANTIDEPR,UR

## 2022-12-31 ENCOUNTER — Other Ambulatory Visit: Payer: Self-pay

## 2022-12-31 MED ORDER — BENAZEPRIL HCL 40 MG PO TABS: 40.0000 mg | ORAL_TABLET | Freq: Every day | ORAL | 0 refills | Status: DC

## 2023-01-02 ENCOUNTER — Telehealth: Payer: Self-pay | Admitting: *Deleted

## 2023-01-02 NOTE — Telephone Encounter (Signed)
Urine drug screen for this encounter is consistent for prescribed medication 

## 2023-01-03 ENCOUNTER — Other Ambulatory Visit: Payer: Self-pay | Admitting: Internal Medicine

## 2023-01-05 ENCOUNTER — Other Ambulatory Visit: Payer: Self-pay | Admitting: Internal Medicine

## 2023-01-05 MED ORDER — LORAZEPAM 1 MG PO TABS: 1.0000 mg | ORAL_TABLET | Freq: Two times a day (BID) | ORAL | 2 refills | Status: DC | PRN

## 2023-01-12 ENCOUNTER — Ambulatory Visit (INDEPENDENT_AMBULATORY_CARE_PROVIDER_SITE_OTHER): Payer: Medicare HMO | Admitting: Internal Medicine

## 2023-01-12 ENCOUNTER — Encounter: Payer: Self-pay | Admitting: Internal Medicine

## 2023-01-12 VITALS — BP 124/80 | HR 70 | Temp 98.9°F | Ht 67.0 in | Wt 280.0 lb

## 2023-01-12 DIAGNOSIS — I700 Atherosclerosis of aorta: Secondary | ICD-10-CM | POA: Diagnosis not present

## 2023-01-12 DIAGNOSIS — I809 Phlebitis and thrombophlebitis of unspecified site: Secondary | ICD-10-CM | POA: Diagnosis not present

## 2023-01-12 DIAGNOSIS — D4102 Neoplasm of uncertain behavior of left kidney: Secondary | ICD-10-CM | POA: Diagnosis not present

## 2023-01-12 DIAGNOSIS — R739 Hyperglycemia, unspecified: Secondary | ICD-10-CM

## 2023-01-12 DIAGNOSIS — R339 Retention of urine, unspecified: Secondary | ICD-10-CM | POA: Diagnosis not present

## 2023-01-12 DIAGNOSIS — E7800 Pure hypercholesterolemia, unspecified: Secondary | ICD-10-CM

## 2023-01-12 DIAGNOSIS — N35011 Post-traumatic bulbous urethral stricture: Secondary | ICD-10-CM | POA: Diagnosis not present

## 2023-01-12 DIAGNOSIS — Z0001 Encounter for general adult medical examination with abnormal findings: Secondary | ICD-10-CM

## 2023-01-12 DIAGNOSIS — I10 Essential (primary) hypertension: Secondary | ICD-10-CM

## 2023-01-12 DIAGNOSIS — N323 Diverticulum of bladder: Secondary | ICD-10-CM | POA: Diagnosis not present

## 2023-01-12 DIAGNOSIS — R3914 Feeling of incomplete bladder emptying: Secondary | ICD-10-CM | POA: Diagnosis not present

## 2023-01-12 DIAGNOSIS — N1831 Chronic kidney disease, stage 3a: Secondary | ICD-10-CM

## 2023-01-12 NOTE — Progress Notes (Unsigned)
Patient ID: Darren Black, male   DOB: February 25, 1966, 57 y.o.   MRN: [id number]         Chief Complaint:: wellness exam and right thigh tender area, recent urinary retention UTI,  aortic atherosclerosis, hld, htn, ckd, hyperglycemia       HPI:  Darren Black is a 57 y.o. male here for wellness exam; for shingrix at pharmacy, o/w up to date                        Also Pt denies chest pain, increased sob or doe, wheezing, orthopnea, PND, increased LE swelling, palpitations, dizziness or syncope.   Pt denies polydipsia, polyuria, or new focal neuro s/s.    Pt denies fever, wt loss, night sweats, loss of appetite, or other constitutional symptoms  Also has a 1 cm area right medial thigh tender firm slightly red.  Denies urinary symptoms such as dysuria, frequency, urgency, flank pain, hematuria or n/v, fever, chills. - was tx with antibx for uti and catheter for 2 wks, symptoms now resolved, catheter out, has f/u with urology soon.     Wt Readings from Last 3 Encounters:  01/12/23 280 lb (127 kg)  12/22/22 285 lb 9.6 oz (129.5 kg)  10/21/22 280 lb (127 kg)   BP Readings from Last 3 Encounters:  01/12/23 124/80  12/22/22 113/79  10/21/22 (!) 174/80   Immunization History  Administered Date(s) Administered   Influenza Split 08/28/2011   Influenza,inj,Quad PF,6+ Mos 07/28/2017, 07/12/2018, 05/05/2019, 07/14/2022   Influenza-Unspecified 07/20/2020   PFIZER(Purple Top)SARS-COV-2 Vaccination 10/13/2019, 11/07/2019, 05/28/2020, 01/14/2021   Tdap 01/21/2017   Health Maintenance Due  Topic Date Due   Zoster Vaccines- Shingrix (1 of 2) Never done   Medicare Annual Wellness (AWV)  01/23/2023      Past Medical History:  Diagnosis Date   Anxiety    BPH (benign prostatic hypertrophy)    Chronic lower back pain    nerve damage w/ leg pain   Depression    Diarrhea 03/23/2019   Family history of adverse reaction to anesthesia    MOTHER--- HARD TO WAKE   History of panic attacks    Hyperlipidemia     Hypertension    Meatal stenosis    OSA on CPAP    moderate to severe OSA  per study 04-28-2007 uses CPAP   PONV (postoperative nausea and vomiting)    Past Surgical History:  Procedure Laterality Date   APPENDECTOMY  2003   BREATH TEK H PYLORI  04/27/2012   Procedure: BREATH TEK H PYLORI;  Surgeon: Valarie Merino, MD;  Location: [facility];  Service: General;  Laterality: N/A;   CHOLECYSTECTOMY N/A 03/24/2019   Procedure: LAPAROSCOPIC CHOLECYSTECTOMY WITH INTRAOPERATIVE CHOLANGIOGRAM;  Surgeon: Manus Rudd, MD;  Location: [facility];  Service: General;  Laterality: N/A;   CYSTOSCOPY WITH URETHRAL DILATATION N/A 04/12/2015   Procedure: CYSTOSCOPY WITH URETHRAL DILATATION, RETROGRADE AND URETHROGRAM WITH BLADDER BIOPSY;  Surgeon: Bjorn Pippin, MD;  Location: [facility];  Service: Urology;  Laterality: N/A;   ROTATOR CUFF REPAIR Right 01-21-2012   TRANSTHORACIC ECHOCARDIOGRAM  03-26-2007   normal LVSF, ef 60%,  mild AV calcification without stenosis,  mild MR and TR,  mild LAE   URETHROGRAM N/A 04/12/2015   Procedure: URETHROGRAM;  Surgeon: Bjorn Pippin, MD;  Location: [facility];  Service: Urology;  Laterality: N/A;    reports that he has never smoked. He has never used smokeless tobacco. He reports  current alcohol use. He reports that he does not use drugs. family history includes Cancer in his father; Coronary artery disease in his father; Diabetes in an other family member; Hyperlipidemia in an other family member; Hypertension in an other family member; Stroke in an other family member. Allergies  Allergen Reactions   Gabapentin Swelling   Crestor [Rosuvastatin Calcium] Other (See Comments)    abd pain   Current Outpatient Medications on File Prior to Visit  Medication Sig Dispense Refill   Amino Acids (COMPLETE AMINO ACID MIX PO) Take by mouth.     amLODipine (NORVASC) 10 MG tablet Take 1 tablet by mouth once daily 90 tablet 3   anastrozole (ARIMIDEX)  1 MG tablet Take 1 mg by mouth once a week.     aspirin EC 81 MG tablet Take 1 tablet (81 mg total) by mouth daily. Swallow whole. 30 tablet 12   benazepril (LOTENSIN) 40 MG tablet Take 1 tablet (40 mg total) by mouth daily. 90 tablet 0   Cetirizine HCl (KLS ALLER-TEC PO) Take by mouth.     Cholecalciferol (D3 5000 PO) Take by mouth.     Coenzyme Q10 (COQ10) 100 MG CAPS Take 300 mg by mouth.     doxazosin (CARDURA) 4 MG tablet TAKE 1 TABLET BY MOUTH AT BEDTIME 90 tablet 03   DULoxetine (CYMBALTA) 60 MG capsule Take 1 capsule by mouth once daily in the morning 90 capsule 3   ezetimibe (ZETIA) 10 MG tablet Take 1 tablet by mouth once daily 90 tablet 0   fluticasone (FLONASE) 50 MCG/ACT nasal spray Use 2 spray(s) in each nostril once daily 16 g 0   Ginger, Zingiber officinalis, (GINGER ROOT) 550 MG CAPS Take by mouth.     hydrochlorothiazide (HYDRODIURIL) 25 MG tablet Take 1/2 (one-half) tablet by mouth once daily 45 tablet 0   LORazepam (ATIVAN) 1 MG tablet Take 1 tablet (1 mg total) by mouth 2 (two) times daily as needed. for anxiety 60 tablet 2   MILK THISTLE PO Milk Thistle     Omega 3-6-9 Fatty Acids (TRIPLE OMEGA COMPLEX PO) Take by mouth.     oxyCODONE-acetaminophen (PERCOCET/ROXICET) 5-325 MG tablet Take 1 tablet by mouth every 8 (eight) hours as needed for severe pain. Will increase pain meds slightly since hurting more- can pick up early since can take up to 10 pills extra this month- can refill next month as of 12/23/22 70 tablet 0   potassium chloride (KLOR-CON) 10 MEQ tablet Take 1 tablet (10 mEq total) by mouth daily. 90 tablet 3   pravastatin (PRAVACHOL) 80 MG tablet Take 1 tablet (80 mg total) by mouth daily. 90 tablet 3   Saw Palmetto 1000 MG CAPS Take by mouth.     Sodium Hyaluronate, oral, (HYALURONIC ACID) 100 MG CAPS Take 200 mg by mouth.     tamsulosin (FLOMAX) 0.4 MG CAPS capsule Take 1 capsule by mouth once daily 90 capsule 0   testosterone cypionate (DEPOTESTOSTERONE  CYPIONATE) 200 MG/ML injection Inject 200 mg into the muscle once a week.     tirzepatide (ZEPBOUND) 2.5 MG/0.5ML Pen Inject 2.5 mg into the skin once a week. R73.03 prediabetes 2 mL 11   Turmeric 500 MG CAPS Take by mouth.     No current facility-administered medications on file prior to visit.        ROS:  All others reviewed and negative.  Objective        PE:  BP 124/80 (BP Location: Right  Arm, Patient Position: Sitting, Cuff Size: Normal)   Pulse 70   Temp 98.9 F (37.2 C) (Oral)   Ht 5\' 7"  (1.702 m)   Wt 280 lb (127 kg)   SpO2 96%   BMI 43.85 kg/m                 Constitutional: Pt appears in NAD               HENT: Head: NCAT.                Right Ear: External ear normal.                 Left Ear: External ear normal.                Eyes: . Pupils are equal, round, and reactive to light. Conjunctivae and EOM are normal               Nose: without d/c or deformity               Neck: Neck supple. Gross normal ROM               Cardiovascular: Normal rate and regular rhythm.                 Pulmonary/Chest: Effort normal and breath sounds without rales or wheezing.                Abd:  Soft, NT, ND, + BS, no organomegaly               Neurological: Pt is alert. At baseline orientation, motor grossly intact               Skin: Skin is warm. No rashes, but has right medial thigh firm area 1 cm mild tender c/w superficial phlebitis,  LE edema - none               Psychiatric: Pt behavior is normal without agitation   Micro: none  Cardiac tracings I have personally interpreted today:  none  Pertinent Radiological findings (summarize): none   Lab Results  Component Value Date   WBC 10.7 (H) 10/21/2022   HGB 16.5 10/21/2022   HCT 50.1 10/21/2022   PLT 176 10/21/2022   GLUCOSE 136 (H) 10/21/2022   CHOL 126 07/10/2022   TRIG 69.0 07/10/2022   HDL 42.50 07/10/2022   LDLDIRECT 139.0 01/02/2016   LDLCALC 70 07/10/2022   ALT 34 07/10/2022   AST 20 07/10/2022   NA 132  (L) 10/21/2022   K 3.3 (L) 10/21/2022   CL 101 10/21/2022   CREATININE 1.42 (H) 10/21/2022   BUN 13 10/21/2022   CO2 23 10/21/2022   TSH 1.74 01/03/2022   PSA 1.07 01/03/2022   HGBA1C 5.9 07/10/2022   MICROALBUR 1.2 07/05/2021   Assessment/Plan:  Davison Apodaca is a 57 y.o. White or Caucasian [1] male with  has a past medical history of Anxiety, BPH (benign prostatic hypertrophy), Chronic lower back pain, Depression, Diarrhea (03/23/2019), Family history of adverse reaction to anesthesia, History of panic attacks, Hyperlipidemia, Hypertension, Meatal stenosis, OSA on CPAP, and PONV (postoperative nausea and vomiting).  Encounter for well adult exam with abnormal findings Age and sex appropriate education and counseling updated with regular exercise and diet Referrals for preventative services - none needed Immunizations addressed - for shingrix at pharmacy Smoking counseling  - none needed Evidence for depression or other mood disorder - none significant Most  recent labs reviewed. I have personally reviewed and have noted: 1) the patient's medical and social history 2) The patient's current medications and supplements 3) The patient's height, weight, and BMI have been recorded in the chart   Aortic atherosclerosis (HCC) Pt to continue pravachol 80 mg qd, low chol diet, excercise  Hyperlipidemia Lab Results  Component Value Date   LDLCALC 70 07/10/2022   Uncontrolled, goal ldl < 70,, pt to continue current statin pravachol 80 mg qd, zetia 10 qd and for lower chol diet, declines change statin   Essential hypertension BP Readings from Last 3 Encounters:  01/12/23 124/80  12/22/22 113/79  10/21/22 (!) 174/80   Stable, pt to continue medical treatment norvasc 10 qd, lotensin 40 qd   CKD (chronic kidney disease) stage 3, GFR 30-59 ml/min (HCC) Lab Results  Component Value Date   CREATININE 1.42 (H) 10/21/2022   Stable overall, cont to avoid  nephrotoxins   Hyperglycemia Lab Results  Component Value Date   HGBA1C 5.9 07/10/2022   Stable, pt to continue current medical treatment  - diet, wt control, zepbound 2.5 mg weekly   Superficial phlebitis Mild area right medial thigh, d/w pt and reassured, to cont asa 81 mg qd,  to f/u any worsening symptoms or concerns Followup: Return in about 6 months (around 07/15/2023).  Oliver Barre, MD 01/15/2023 1:27 PM [facility]

## 2023-01-12 NOTE — Patient Instructions (Addendum)

## 2023-01-15 ENCOUNTER — Encounter: Payer: Self-pay | Admitting: Internal Medicine

## 2023-01-15 ENCOUNTER — Other Ambulatory Visit (INDEPENDENT_AMBULATORY_CARE_PROVIDER_SITE_OTHER): Payer: Medicare HMO

## 2023-01-15 DIAGNOSIS — E559 Vitamin D deficiency, unspecified: Secondary | ICD-10-CM | POA: Diagnosis not present

## 2023-01-15 DIAGNOSIS — R739 Hyperglycemia, unspecified: Secondary | ICD-10-CM

## 2023-01-15 DIAGNOSIS — Z125 Encounter for screening for malignant neoplasm of prostate: Secondary | ICD-10-CM

## 2023-01-15 DIAGNOSIS — E7800 Pure hypercholesterolemia, unspecified: Secondary | ICD-10-CM

## 2023-01-15 DIAGNOSIS — E538 Deficiency of other specified B group vitamins: Secondary | ICD-10-CM | POA: Diagnosis not present

## 2023-01-15 DIAGNOSIS — I809 Phlebitis and thrombophlebitis of unspecified site: Secondary | ICD-10-CM | POA: Insufficient documentation

## 2023-01-15 LAB — CBC WITH DIFFERENTIAL/PLATELET
Basophils Absolute: 0.1 10*3/uL (ref 0.0–0.1)
Basophils Relative: 0.8 % (ref 0.0–3.0)
Eosinophils Absolute: 0.2 10*3/uL (ref 0.0–0.7)
Eosinophils Relative: 2.9 % (ref 0.0–5.0)
HCT: 51.2 % (ref 39.0–52.0)
Hemoglobin: 17.3 g/dL — ABNORMAL HIGH (ref 13.0–17.0)
Lymphocytes Relative: 21.8 % (ref 12.0–46.0)
Lymphs Abs: 1.6 10*3/uL (ref 0.7–4.0)
MCHC: 33.7 g/dL (ref 30.0–36.0)
MCV: 81.1 fl (ref 78.0–100.0)
Monocytes Absolute: 0.6 10*3/uL (ref 0.1–1.0)
Monocytes Relative: 8.5 % (ref 3.0–12.0)
Neutro Abs: 4.9 10*3/uL (ref 1.4–7.7)
Neutrophils Relative %: 66 % (ref 43.0–77.0)
Platelets: 180 10*3/uL (ref 150.0–400.0)
RBC: 6.32 Mil/uL — ABNORMAL HIGH (ref 4.22–5.81)
RDW: 14.8 % (ref 11.5–15.5)
WBC: 7.4 10*3/uL (ref 4.0–10.5)

## 2023-01-15 LAB — VITAMIN D 25 HYDROXY (VIT D DEFICIENCY, FRACTURES): VITD: 59.83 ng/mL (ref 30.00–100.00)

## 2023-01-15 LAB — LIPID PANEL
Cholesterol: 119 mg/dL (ref 0–200)
HDL: 33.5 mg/dL — ABNORMAL LOW (ref >39.00)
LDL Cholesterol: 61 mg/dL (ref 0–99)
NonHDL: 85.92
Total CHOL/HDL Ratio: 4
Triglycerides: 125 mg/dL (ref 0.0–149.0)
VLDL: 25 mg/dL (ref 0.0–40.0)

## 2023-01-15 LAB — BASIC METABOLIC PANEL
BUN: 24 mg/dL — ABNORMAL HIGH (ref 6–23)
CO2: 24 mEq/L (ref 19–32)
Calcium: 9.2 mg/dL (ref 8.4–10.5)
Chloride: 100 mEq/L (ref 96–112)
Creatinine, Ser: 1.7 mg/dL — ABNORMAL HIGH (ref 0.40–1.50)
GFR: 44.71 mL/min — ABNORMAL LOW (ref >60.00)
Glucose, Bld: 102 mg/dL — ABNORMAL HIGH (ref 70–99)
Potassium: 3.4 mEq/L — ABNORMAL LOW (ref 3.5–5.1)
Sodium: 139 mEq/L (ref 135–145)

## 2023-01-15 LAB — MICROALBUMIN / CREATININE URINE RATIO
Creatinine,U: 76.6 mg/dL
Microalb Creat Ratio: 4.6 mg/g (ref 0.0–30.0)
Microalb, Ur: 3.5 mg/dL — ABNORMAL HIGH (ref 0.0–1.9)

## 2023-01-15 LAB — HEPATIC FUNCTION PANEL
ALT: 27 U/L (ref 0–53)
AST: 22 U/L (ref 0–37)
Albumin: 4.3 g/dL (ref 3.5–5.2)
Alkaline Phosphatase: 42 U/L (ref 39–117)
Bilirubin, Direct: 0.2 mg/dL (ref 0.0–0.3)
Total Bilirubin: 1.1 mg/dL (ref 0.2–1.2)
Total Protein: 6.8 g/dL (ref 6.0–8.3)

## 2023-01-15 LAB — URINALYSIS, ROUTINE W REFLEX MICROSCOPIC
Bilirubin Urine: NEGATIVE
Hgb urine dipstick: NEGATIVE
Ketones, ur: NEGATIVE
Leukocytes,Ua: NEGATIVE
Nitrite: NEGATIVE
RBC / HPF: NONE SEEN (ref 0–?)
Specific Gravity, Urine: <=1.005 — AB (ref 1.000–1.030)
Total Protein, Urine: NEGATIVE
Urine Glucose: NEGATIVE
Urobilinogen, UA: 0.2 (ref 0.0–1.0)
pH: 6 (ref 5.0–8.0)

## 2023-01-15 LAB — TSH: TSH: 1.25 u[IU]/mL (ref 0.35–5.50)

## 2023-01-15 LAB — VITAMIN B12: Vitamin B-12: 1019 pg/mL — ABNORMAL HIGH (ref 211–911)

## 2023-01-15 LAB — PSA: PSA: 2.27 ng/mL (ref 0.10–4.00)

## 2023-01-15 LAB — HEMOGLOBIN A1C: Hgb A1c MFr Bld: 5.8 % (ref 4.6–6.5)

## 2023-01-15 NOTE — Assessment & Plan Note (Addendum)
Lab Results  Component Value Date   LDLCALC 70 07/10/2022   Uncontrolled, goal ldl < 70,, pt to continue current statin pravachol 80 mg qd, zetia 10 qd and for lower chol diet, declines change statin

## 2023-01-15 NOTE — Assessment & Plan Note (Signed)
Mild area right medial thigh, d/w pt and reassured, to cont asa 81 mg qd,  to f/u any worsening symptoms or concerns

## 2023-01-15 NOTE — Assessment & Plan Note (Signed)
Lab Results  Component Value Date   HGBA1C 5.9 07/10/2022   Stable, pt to continue current medical treatment  - diet, wt control, zepbound 2.5 mg weekly

## 2023-01-15 NOTE — Addendum Note (Signed)
Addended by: Corwin Levins on: 01/15/2023 01:27 PM   Modules accepted: Level of Service

## 2023-01-15 NOTE — Assessment & Plan Note (Signed)
Pt to continue pravachol 80 mg qd, low chol diet, excercise

## 2023-01-15 NOTE — Assessment & Plan Note (Signed)
BP Readings from Last 3 Encounters:  01/12/23 124/80  12/22/22 113/79  10/21/22 (!) 174/80   Stable, pt to continue medical treatment norvasc 10 qd, lotensin 40 qd

## 2023-01-15 NOTE — Assessment & Plan Note (Signed)
Lab Results  Component Value Date   CREATININE 1.42 (H) 10/21/2022   Stable overall, cont to avoid nephrotoxins

## 2023-01-15 NOTE — Assessment & Plan Note (Signed)

## 2023-01-16 ENCOUNTER — Other Ambulatory Visit: Payer: Self-pay | Admitting: Internal Medicine

## 2023-01-16 MED ORDER — POTASSIUM CHLORIDE ER 10 MEQ PO TBCR: 20.0000 meq | EXTENDED_RELEASE_TABLET | Freq: Every day | ORAL | 3 refills | Status: DC

## 2023-01-22 ENCOUNTER — Telehealth: Payer: Self-pay | Admitting: *Deleted

## 2023-01-22 MED ORDER — OXYCODONE-ACETAMINOPHEN 5-325 MG PO TABS: 1.0000 | ORAL_TABLET | Freq: Four times a day (QID) | ORAL | 0 refills | Status: DC | PRN

## 2023-01-22 NOTE — Telephone Encounter (Signed)
Mrs Delamarter called for a refill on Mr Brandenburger's oxycodone acetaminophen.

## 2023-01-28 DIAGNOSIS — G4733 Obstructive sleep apnea (adult) (pediatric): Secondary | ICD-10-CM | POA: Diagnosis not present

## 2023-02-06 DIAGNOSIS — E079 Disorder of thyroid, unspecified: Secondary | ICD-10-CM | POA: Diagnosis not present

## 2023-02-06 DIAGNOSIS — E291 Testicular hypofunction: Secondary | ICD-10-CM | POA: Diagnosis not present

## 2023-02-06 DIAGNOSIS — Z79899 Other long term (current) drug therapy: Secondary | ICD-10-CM | POA: Diagnosis not present

## 2023-02-06 DIAGNOSIS — E279 Disorder of adrenal gland, unspecified: Secondary | ICD-10-CM | POA: Diagnosis not present

## 2023-02-06 DIAGNOSIS — R972 Elevated prostate specific antigen [PSA]: Secondary | ICD-10-CM | POA: Diagnosis not present

## 2023-02-06 DIAGNOSIS — E8881 Metabolic syndrome: Secondary | ICD-10-CM | POA: Diagnosis not present

## 2023-02-09 DIAGNOSIS — N281 Cyst of kidney, acquired: Secondary | ICD-10-CM | POA: Diagnosis not present

## 2023-02-09 DIAGNOSIS — N2889 Other specified disorders of kidney and ureter: Secondary | ICD-10-CM | POA: Diagnosis not present

## 2023-02-09 DIAGNOSIS — D49512 Neoplasm of unspecified behavior of left kidney: Secondary | ICD-10-CM | POA: Diagnosis not present

## 2023-02-16 DIAGNOSIS — E291 Testicular hypofunction: Secondary | ICD-10-CM | POA: Diagnosis not present

## 2023-02-23 ENCOUNTER — Other Ambulatory Visit: Payer: Self-pay | Admitting: *Deleted

## 2023-02-23 MED ORDER — OXYCODONE-ACETAMINOPHEN 5-325 MG PO TABS: 1.0000 | ORAL_TABLET | Freq: Four times a day (QID) | ORAL | 0 refills | Status: DC | PRN

## 2023-02-24 ENCOUNTER — Other Ambulatory Visit: Payer: Self-pay | Admitting: *Deleted

## 2023-02-24 MED ORDER — OXYCODONE-ACETAMINOPHEN 5-325 MG PO TABS: 1.0000 | ORAL_TABLET | Freq: Four times a day (QID) | ORAL | 0 refills | Status: DC | PRN

## 2023-02-24 NOTE — Telephone Encounter (Signed)
Please resend rx pharmacy needed dx code.

## 2023-03-12 ENCOUNTER — Other Ambulatory Visit: Payer: Self-pay | Admitting: Internal Medicine

## 2023-03-12 ENCOUNTER — Other Ambulatory Visit: Payer: Self-pay

## 2023-03-16 DIAGNOSIS — R3914 Feeling of incomplete bladder emptying: Secondary | ICD-10-CM | POA: Diagnosis not present

## 2023-03-16 DIAGNOSIS — N281 Cyst of kidney, acquired: Secondary | ICD-10-CM | POA: Diagnosis not present

## 2023-03-16 DIAGNOSIS — Z125 Encounter for screening for malignant neoplasm of prostate: Secondary | ICD-10-CM | POA: Diagnosis not present

## 2023-03-18 ENCOUNTER — Other Ambulatory Visit: Payer: Self-pay | Admitting: Urology

## 2023-03-18 ENCOUNTER — Other Ambulatory Visit: Payer: Self-pay | Admitting: Internal Medicine

## 2023-03-24 ENCOUNTER — Encounter: Payer: Medicare HMO | Attending: Registered Nurse | Admitting: Registered Nurse

## 2023-03-24 VITALS — BP 126/88 | HR 75 | Ht 67.0 in | Wt 274.4 lb

## 2023-03-24 DIAGNOSIS — Z5181 Encounter for therapeutic drug level monitoring: Secondary | ICD-10-CM | POA: Insufficient documentation

## 2023-03-24 DIAGNOSIS — M5416 Radiculopathy, lumbar region: Secondary | ICD-10-CM | POA: Diagnosis not present

## 2023-03-24 DIAGNOSIS — G8929 Other chronic pain: Secondary | ICD-10-CM | POA: Diagnosis not present

## 2023-03-24 DIAGNOSIS — M546 Pain in thoracic spine: Secondary | ICD-10-CM | POA: Diagnosis not present

## 2023-03-24 DIAGNOSIS — G894 Chronic pain syndrome: Secondary | ICD-10-CM | POA: Diagnosis not present

## 2023-03-24 MED ORDER — OXYCODONE-ACETAMINOPHEN 5-325 MG PO TABS: 1.0000 | ORAL_TABLET | Freq: Four times a day (QID) | ORAL | 0 refills | Status: DC | PRN

## 2023-03-24 NOTE — Progress Notes (Unsigned)
Subjective:    Patient ID: Darren Black, male    DOB: 1966/05/17, 57 y.o.   MRN: [id number]  HPI: Darren Black is a 57 y.o. male who returns for follow up appointment for chronic pain and medication refill. states *** pain is located in  ***. rates pain ***. current exercise regime is walking and performing stretching exercises.     Mr. Visser Morphine equivalent is *** MME.   he is also prescribed *** by Dr. Marland Kitchen .We have discussed the black box warning of using opioids and benzodiazepines. I highlighted the dangers of using these drugs together and discussed the adverse events including respiratory suppression, overdose, cognitive impairment and importance of compliance with current regimen. We will continue to monitor and adjust as indicated.  he is being closely monitored and under the care of his psychiatrist________. he verbalizes understanding.    Last UDS was Performed on 12/22/2022, it was consistent.   Pain Inventory Average Pain 9 Pain Right Now 9 My pain is sharp, stabbing, and aching  In the last 24 hours, has pain interfered with the following? General activity 9 Relation with others 8 Enjoyment of life 9 What TIME of day is your pain at its worst? morning , daytime, evening, and night Sleep (in general) Poor  Pain is worse with: walking, bending, sitting, inactivity, standing, and some activites Pain improves with: medication Relief from Meds: 1  Family History  Problem Relation Age of Onset   Coronary artery disease Father    Cancer Father        anaplastic thyroid cancer   Stroke Other        1st degree relative   Diabetes Other        1st degree relative   Hypertension Other    Hyperlipidemia Other    Social History   Socioeconomic History   Marital status: Married    Spouse name: Not on file   Number of children: Not on file   Years of education: Not on file   Highest education level: Not on file  Occupational History   Occupation: disabled,  LBP and anxiety  Tobacco Use   Smoking status: Never   Smokeless tobacco: Never  Vaping Use   Vaping status: Never Used  Substance and Sexual Activity   Alcohol use: Yes    Comment: rare   Drug use: No   Sexual activity: Not on file  Other Topics Concern   Not on file  Social History Narrative   Not on file   Social Determinants of Health   Financial Resource Strain: Low Risk  (01/22/2022)   Overall Financial Resource Strain (CARDIA)    Difficulty of Paying Living Expenses: Not hard at all  Food Insecurity: No Food Insecurity (01/22/2022)   Hunger Vital Sign    Worried About Running Out of Food in the Last Year: Never true    Ran Out of Food in the Last Year: Never true  Transportation Needs: No Transportation Needs (01/22/2022)   PRAPARE - Administrator, Civil Service (Medical): No    Lack of Transportation (Non-Medical): No  Physical Activity: Inactive (01/22/2022)   Exercise Vital Sign    Days of Exercise per Week: 0 days    Minutes of Exercise per Session: 0 min  Stress: No Stress Concern Present (01/22/2022)   [employer] of Occupational Health - Occupational Stress Questionnaire    Feeling of Stress : Not at all  Social Connections: Moderately Isolated (01/22/2022)  Social Connection and Isolation Panel [NHANES]    Frequency of Communication with Friends and Family: More than three times a week    Frequency of Social Gatherings with Friends and Family: Once a week    Attends Religious Services: Never    Database administrator or Organizations: No    Attends Engineer, structural: Never    Marital Status: Married   Past Surgical History:  Procedure Laterality Date   APPENDECTOMY  2003   BREATH Nelda Bucks  04/27/2012   Procedure: BREATH TEK H PYLORI;  Surgeon: Valarie Merino, MD;  Location: [facility];  Service: General;  Laterality: N/A;   CHOLECYSTECTOMY N/A 03/24/2019   Procedure: LAPAROSCOPIC CHOLECYSTECTOMY WITH INTRAOPERATIVE  CHOLANGIOGRAM;  Surgeon: Manus Rudd, MD;  Location: [facility];  Service: General;  Laterality: N/A;   CYSTOSCOPY WITH URETHRAL DILATATION N/A 04/12/2015   Procedure: CYSTOSCOPY WITH URETHRAL DILATATION, RETROGRADE AND URETHROGRAM WITH BLADDER BIOPSY;  Surgeon: Bjorn Pippin, MD;  Location: [facility];  Service: Urology;  Laterality: N/A;   ROTATOR CUFF REPAIR Right 01-21-2012   TRANSTHORACIC ECHOCARDIOGRAM  03-26-2007   normal LVSF, ef 60%,  mild AV calcification without stenosis,  mild MR and TR,  mild LAE   URETHROGRAM N/A 04/12/2015   Procedure: URETHROGRAM;  Surgeon: Bjorn Pippin, MD;  Location: [facility];  Service: Urology;  Laterality: N/A;   Past Surgical History:  Procedure Laterality Date   APPENDECTOMY  2003   BREATH TEK H PYLORI  04/27/2012   Procedure: BREATH TEK H PYLORI;  Surgeon: Valarie Merino, MD;  Location: [facility];  Service: General;  Laterality: N/A;   CHOLECYSTECTOMY N/A 03/24/2019   Procedure: LAPAROSCOPIC CHOLECYSTECTOMY WITH INTRAOPERATIVE CHOLANGIOGRAM;  Surgeon: Manus Rudd, MD;  Location: [facility];  Service: General;  Laterality: N/A;   CYSTOSCOPY WITH URETHRAL DILATATION N/A 04/12/2015   Procedure: CYSTOSCOPY WITH URETHRAL DILATATION, RETROGRADE AND URETHROGRAM WITH BLADDER BIOPSY;  Surgeon: Bjorn Pippin, MD;  Location: [facility];  Service: Urology;  Laterality: N/A;   ROTATOR CUFF REPAIR Right 01-21-2012   TRANSTHORACIC ECHOCARDIOGRAM  03-26-2007   normal LVSF, ef 60%,  mild AV calcification without stenosis,  mild MR and TR,  mild LAE   URETHROGRAM N/A 04/12/2015   Procedure: URETHROGRAM;  Surgeon: Bjorn Pippin, MD;  Location: [facility];  Service: Urology;  Laterality: N/A;   Past Medical History:  Diagnosis Date   Anxiety    BPH (benign prostatic hypertrophy)    Chronic lower back pain    nerve damage w/ leg pain   Depression    Diarrhea 03/23/2019   Family history of adverse reaction to  anesthesia    MOTHER--- HARD TO WAKE   History of panic attacks    Hyperlipidemia    Hypertension    Meatal stenosis    OSA on CPAP    moderate to severe OSA  per study 04-28-2007 uses CPAP   PONV (postoperative nausea and vomiting)    BP 126/88   Pulse 75   Ht 5\' 7"  (1.702 m)   Wt 274 lb 6.4 oz (124.5 kg)   SpO2 93%   BMI 42.98 kg/m   Opioid Risk Score:   Fall Risk Score:  `1  Depression screen PHQ 2/9     01/12/2023    3:30 PM 09/22/2022    1:55 PM 07/14/2022    3:01 PM 06/20/2022   11:34 AM 02/21/2022   11:09 AM 01/22/2022    1:30  PM 01/07/2022    3:43 PM  Depression screen PHQ 2/9  Decreased Interest 0 0 0 3 1 1  0  Down, Depressed, Hopeless 0 0 0 3 1 1  0  PHQ - 2 Score 0 0 0 6 2 2  0  Altered sleeping   0   0   Tired, decreased energy   0   3   Change in appetite   0   0   Feeling bad or failure about yourself    0   0   Trouble concentrating   0   2   Moving slowly or fidgety/restless   0   0   Suicidal thoughts   0   0   PHQ-9 Score   0   7   Difficult doing work/chores   Not difficult at all   Not difficult at all      Review of Systems  Musculoskeletal:  Positive for back pain.       Back of leg pain Bilateral knee pain  All other systems reviewed and are negative.     Objective:   Physical Exam        Assessment & Plan:

## 2023-03-25 ENCOUNTER — Encounter: Payer: Self-pay | Admitting: Registered Nurse

## 2023-04-01 ENCOUNTER — Other Ambulatory Visit: Payer: Self-pay | Admitting: Internal Medicine

## 2023-04-05 ENCOUNTER — Other Ambulatory Visit: Payer: Self-pay | Admitting: Internal Medicine

## 2023-04-10 DIAGNOSIS — E079 Disorder of thyroid, unspecified: Secondary | ICD-10-CM | POA: Diagnosis not present

## 2023-04-10 DIAGNOSIS — E291 Testicular hypofunction: Secondary | ICD-10-CM | POA: Diagnosis not present

## 2023-04-10 DIAGNOSIS — Z79899 Other long term (current) drug therapy: Secondary | ICD-10-CM | POA: Diagnosis not present

## 2023-04-10 DIAGNOSIS — E279 Disorder of adrenal gland, unspecified: Secondary | ICD-10-CM | POA: Diagnosis not present

## 2023-04-10 DIAGNOSIS — E8881 Metabolic syndrome: Secondary | ICD-10-CM | POA: Diagnosis not present

## 2023-04-15 DIAGNOSIS — D492 Neoplasm of unspecified behavior of bone, soft tissue, and skin: Secondary | ICD-10-CM | POA: Diagnosis not present

## 2023-04-15 DIAGNOSIS — C44529 Squamous cell carcinoma of skin of other part of trunk: Secondary | ICD-10-CM | POA: Diagnosis not present

## 2023-04-23 DIAGNOSIS — E291 Testicular hypofunction: Secondary | ICD-10-CM | POA: Diagnosis not present

## 2023-04-30 DIAGNOSIS — N281 Cyst of kidney, acquired: Secondary | ICD-10-CM | POA: Diagnosis not present

## 2023-04-30 DIAGNOSIS — R3914 Feeling of incomplete bladder emptying: Secondary | ICD-10-CM | POA: Diagnosis not present

## 2023-04-30 DIAGNOSIS — N323 Diverticulum of bladder: Secondary | ICD-10-CM | POA: Diagnosis not present

## 2023-04-30 NOTE — Patient Instructions (Addendum)
SURGICAL WAITING ROOM VISITATION Patients having surgery or a procedure may have no more than 2 support people in the waiting area - these visitors may rotate.    Children under the age of 2 must have an adult with them who is not the patient.  If the patient needs to stay at the hospital during part of their recovery, the visitor guidelines for inpatient rooms apply. Pre-op nurse will coordinate an appropriate time for 1 support person to accompany patient in pre-op.  This support person may not rotate.    Please refer to the [facility] website for the visitor guidelines for Inpatients (after your surgery is over and you are in a regular room).       Your procedure is scheduled on: 05-13-23   Report to [facility] Main Entrance    Report to admitting at 9:45 AM   Call this number if you have problems the morning of surgery [phone]   Follow a clear liquid diet the day before surgery   Do not eat food or drink liquids:After Midnight.           If you have questions, please contact your surgeon's office.   FOLLOW BOWEL PREP AND ANY ADDITIONAL PRE OP INSTRUCTIONS YOU RECEIVED FROM YOUR SURGEON'S OFFICE!!!      - clear liquids day before surgery   -drink one bottle of Magnesium Citrate by noon the day before surgery  Oral Hygiene is also important to reduce your risk of infection.                                    Remember - BRUSH YOUR TEETH THE MORNING OF SURGERY WITH YOUR REGULAR TOOTHPASTE   Do NOT smoke after Midnight   Take these medicines the morning of surgery with A SIP OF WATER:   Amlodipine  Cetirizine  Duloxetine  Ezetimibe  Lorazepam  Pravastatin  Tamsulosin  Okay to use Flonase nasal spray  Oxycodone if needed  Stop all vitamins and herbal supplements 7 days before surgery  Bring CPAP mask and tubing day of surgery.                              You may not have any metal on your body including jewelry, and body piercing             Do not  wear lotions, powders, cologne, or deodorant              Men may shave face and neck.   Do not bring valuables to the hospital. [facility] IS NOT RESPONSIBLE   FOR VALUABLES.   Contacts, dentures or bridgework may not be worn into surgery.   Bring small overnight bag day of surgery.   DO NOT BRING YOUR HOME MEDICATIONS TO THE HOSPITAL. PHARMACY WILL DISPENSE MEDICATIONS LISTED ON YOUR MEDICATION LIST TO YOU DURING YOUR ADMISSION IN THE HOSPITAL!               Please read over the following fact sheets you were given: IF YOU HAVE QUESTIONS ABOUT YOUR PRE-OP INSTRUCTIONS PLEASE CALL [phone] [facility]  If you received a COVID test during your pre-op visit  it is requested that you wear a mask when out in public, stay away from anyone that may not be feeling well and notify your surgeon if you develop symptoms.  If you test positive for Covid or have been in contact with anyone that has tested positive in the last 10 days please notify you surgeon.  [facility] - Preparing for Surgery Before surgery, you can play an important role.  Because skin is not sterile, your skin needs to be as free of germs as possible.  You can reduce the number of germs on your skin by washing with CHG (chlorahexidine gluconate) soap before surgery.  CHG is an antiseptic cleaner which kills germs and bonds with the skin to continue killing germs even after washing. Please DO NOT use if you have an allergy to CHG or antibacterial soaps.  If your skin becomes reddened/irritated stop using the CHG and inform your nurse when you arrive at Short Stay. Do not shave (including legs and underarms) for at least 48 hours prior to the first CHG shower.  You may shave your face/neck.  Please follow these instructions carefully:  1.  Shower with CHG Soap the night before surgery and the  morning of surgery.  2.  If you choose to wash your hair, wash your hair first as usual with your normal  shampoo.  3.  After you shampoo,  rinse your hair and body thoroughly to remove the shampoo.                             4.  Use CHG as you would any other liquid soap.  You can apply chg directly to the skin and wash.  Gently with a scrungie or clean washcloth.  5.  Apply the CHG Soap to your body ONLY FROM THE NECK DOWN.   Do   not use on face/ open                           Wound or open sores. Avoid contact with eyes, ears mouth and   genitals (private parts).                       Wash face,  Genitals (private parts) with your normal soap.             6.  Wash thoroughly, paying special attention to the area where your    surgery  will be performed.  7.  Thoroughly rinse your body with warm water from the neck down.  8.  DO NOT shower/wash with your normal soap after using and rinsing off the CHG Soap.                9.  Pat yourself dry with a clean towel.            10.  Wear clean pajamas.            11.  Place clean sheets on your bed the night of your first shower and do not  sleep with pets. Day of Surgery : Do not apply any lotions/deodorants the morning of surgery.  Please wear clean clothes to the hospital/surgery center.  FAILURE TO FOLLOW THESE INSTRUCTIONS MAY RESULT IN THE CANCELLATION OF YOUR SURGERY  PATIENT SIGNATURE_________________________________  NURSE SIGNATURE__________________________________  ________________________________________________________________________

## 2023-04-30 NOTE — Progress Notes (Addendum)
COVID Vaccine Completed:  Yes  Date of COVID positive in last 90 days:  No  PCP - Oliver Barre, MD Cardiologist - Carolan Clines, MD  Chest x-ray -  EKG - 06-13-22 Epic Stress Test - N/A ECHO - 03-26-07 Epic Cardiac Cath - N/A Pacemaker/ICD device last checked: Spinal Cord Stimulator:  N/A Cardiac CT - 05-27-22 Epic  Bowel Prep - Yes, clear liquids day before and bottle of Mag Citrate.  Patient has instructions  Sleep Study - Yes, +sleep apnea CPAP - Yes  Fasting Blood Sugar - N/A Checks Blood Sugar _____ times a day  Semaglutide for weight loss Last dose of GLP1 agonist-  04/26/23.  Patient states that he is not going to restart until after surgery  GLP1 instructions:  N/A   Last dose of SGLT-2 inhibitors-  N/A SGLT-2 instructions: N/A  Blood Thinner Instructions:  Time Aspirin Instructions:  ASA 81.   Last Dose:  04/26/23  Activity level:  Can go up a flight of stairs and perform activities of daily living without stopping and without symptoms of chest pain or shortness of breath.  Anesthesia review:  Elevated CAC score evaluated by cardiology, HTN, OSA on CPAP, CKD  Hemoglobin 17.5 on PAT labs.  Last dose of Testerone 04/12/23 , patient holding until after surgery  Patient denies shortness of breath, fever, cough and chest pain at PAT appointment  Patient verbalized understanding of instructions that were given to them at the PAT appointment. Patient was also instructed that they will need to review over the PAT instructions again at home before surgery.

## 2023-05-01 ENCOUNTER — Encounter (HOSPITAL_COMMUNITY): Payer: Self-pay

## 2023-05-01 ENCOUNTER — Encounter (HOSPITAL_COMMUNITY)
Admission: RE | Admit: 2023-05-01 | Discharge: 2023-05-01 | Disposition: A | Discharge status: Home / Self Care | Payer: Medicare HMO | Source: Ambulatory Visit | Attending: Urology | Admitting: Urology

## 2023-05-01 ENCOUNTER — Other Ambulatory Visit: Payer: Self-pay

## 2023-05-01 VITALS — BP 140/93 | HR 74 | Temp 98.1°F | Resp 12 | Ht 67.0 in | Wt 268.2 lb

## 2023-05-01 DIAGNOSIS — Z01818 Encounter for other preprocedural examination: Secondary | ICD-10-CM | POA: Diagnosis not present

## 2023-05-01 DIAGNOSIS — Z01812 Encounter for preprocedural laboratory examination: Secondary | ICD-10-CM | POA: Diagnosis not present

## 2023-05-01 DIAGNOSIS — I10 Essential (primary) hypertension: Secondary | ICD-10-CM | POA: Insufficient documentation

## 2023-05-01 DIAGNOSIS — G4733 Obstructive sleep apnea (adult) (pediatric): Secondary | ICD-10-CM | POA: Diagnosis not present

## 2023-05-01 HISTORY — DX: Gastro-esophageal reflux disease without esophagitis: K21.9

## 2023-05-01 LAB — BASIC METABOLIC PANEL
Anion gap: 9 (ref 5–15)
BUN: 23 mg/dL — ABNORMAL HIGH (ref 6–20)
CO2: 24 mmol/L (ref 22–32)
Calcium: 9 mg/dL (ref 8.9–10.3)
Chloride: 102 mmol/L (ref 98–111)
Creatinine, Ser: 1.45 mg/dL — ABNORMAL HIGH (ref 0.61–1.24)
GFR, Estimated: 57 mL/min — ABNORMAL LOW (ref >60)
Glucose, Bld: 96 mg/dL (ref 70–99)
Potassium: 4.2 mmol/L (ref 3.5–5.1)
Sodium: 135 mmol/L (ref 135–145)

## 2023-05-01 LAB — CBC
HCT: 53 % — ABNORMAL HIGH (ref 39.0–52.0)
Hemoglobin: 17.5 g/dL — ABNORMAL HIGH (ref 13.0–17.0)
MCH: 26.8 pg (ref 26.0–34.0)
MCHC: 33 g/dL (ref 30.0–36.0)
MCV: 81 fL (ref 80.0–100.0)
Platelets: 195 10*3/uL (ref 150–400)
RBC: 6.54 MIL/uL — ABNORMAL HIGH (ref 4.22–5.81)
RDW: 13.2 % (ref 11.5–15.5)
WBC: 7.9 10*3/uL (ref 4.0–10.5)
nRBC: 0 % (ref 0.0–0.2)

## 2023-05-13 ENCOUNTER — Inpatient Hospital Stay (HOSPITAL_COMMUNITY)
Admission: RE | Admit: 2023-05-13 | Discharge: 2023-05-14 | DRG: 654 | Disposition: A | Discharge status: Home / Self Care | Payer: Medicare HMO | Source: Ambulatory Visit | Attending: Urology | Admitting: Urology

## 2023-05-13 ENCOUNTER — Ambulatory Visit (HOSPITAL_COMMUNITY): Payer: Medicare HMO | Admitting: Anesthesiology

## 2023-05-13 ENCOUNTER — Other Ambulatory Visit: Payer: Self-pay

## 2023-05-13 ENCOUNTER — Ambulatory Visit (HOSPITAL_COMMUNITY): Payer: Medicare HMO

## 2023-05-13 ENCOUNTER — Encounter (HOSPITAL_COMMUNITY)
Admission: RE | Disposition: A | Discharge status: Home / Self Care | Payer: Self-pay | Source: Ambulatory Visit | Attending: Urology

## 2023-05-13 ENCOUNTER — Encounter (HOSPITAL_COMMUNITY): Payer: Self-pay | Admitting: Urology

## 2023-05-13 ENCOUNTER — Ambulatory Visit (HOSPITAL_COMMUNITY): Payer: Medicare HMO | Admitting: Physician Assistant

## 2023-05-13 DIAGNOSIS — E669 Obesity, unspecified: Secondary | ICD-10-CM | POA: Diagnosis present

## 2023-05-13 DIAGNOSIS — I10 Essential (primary) hypertension: Secondary | ICD-10-CM | POA: Diagnosis present

## 2023-05-13 DIAGNOSIS — N323 Diverticulum of bladder: Principal | ICD-10-CM | POA: Diagnosis present

## 2023-05-13 DIAGNOSIS — K219 Gastro-esophageal reflux disease without esophagitis: Secondary | ICD-10-CM | POA: Diagnosis present

## 2023-05-13 DIAGNOSIS — Z808 Family history of malignant neoplasm of other organs or systems: Secondary | ICD-10-CM

## 2023-05-13 DIAGNOSIS — Z833 Family history of diabetes mellitus: Secondary | ICD-10-CM

## 2023-05-13 DIAGNOSIS — G4733 Obstructive sleep apnea (adult) (pediatric): Secondary | ICD-10-CM | POA: Diagnosis present

## 2023-05-13 DIAGNOSIS — E119 Type 2 diabetes mellitus without complications: Secondary | ICD-10-CM | POA: Diagnosis present

## 2023-05-13 DIAGNOSIS — N400 Enlarged prostate without lower urinary tract symptoms: Secondary | ICD-10-CM | POA: Diagnosis present

## 2023-05-13 DIAGNOSIS — K429 Umbilical hernia without obstruction or gangrene: Secondary | ICD-10-CM | POA: Diagnosis present

## 2023-05-13 DIAGNOSIS — Z8249 Family history of ischemic heart disease and other diseases of the circulatory system: Secondary | ICD-10-CM

## 2023-05-13 DIAGNOSIS — Z6841 Body Mass Index (BMI) 40.0 and over, adult: Secondary | ICD-10-CM

## 2023-05-13 DIAGNOSIS — Z823 Family history of stroke: Secondary | ICD-10-CM

## 2023-05-13 DIAGNOSIS — E785 Hyperlipidemia, unspecified: Secondary | ICD-10-CM | POA: Diagnosis present

## 2023-05-13 DIAGNOSIS — F32A Depression, unspecified: Secondary | ICD-10-CM | POA: Diagnosis present

## 2023-05-13 DIAGNOSIS — Z8042 Family history of malignant neoplasm of prostate: Secondary | ICD-10-CM

## 2023-05-13 HISTORY — PX: CYSTOSCOPY W/ URETERAL STENT PLACEMENT: SHX1429

## 2023-05-13 HISTORY — PX: ROBOTIC ASSISTED LAPAROSCOPIC BLADDER DIVERTICULECTOMY: SHX6079

## 2023-05-13 LAB — HEMOGLOBIN AND HEMATOCRIT, BLOOD
HCT: 49.6 % (ref 39.0–52.0)
Hemoglobin: 16.2 g/dL (ref 13.0–17.0)

## 2023-05-13 SURGERY — EXCISION, DIVERTICULUM, BLADDER, ROBOT-ASSISTED, LAPAROSCOPIC
Anesthesia: General

## 2023-05-13 MED ORDER — ONDANSETRON HCL 4 MG/2ML IJ SOLN: 4.0000 mg | INTRAMUSCULAR | Status: DC | PRN

## 2023-05-13 MED ORDER — ACETAMINOPHEN 500 MG PO TABS
1000.0000 mg | ORAL_TABLET | Freq: Four times a day (QID) | ORAL | Status: DC
  Administered 2023-05-13: 1000 mg via ORAL
  Filled 2023-05-13 (×3): qty 2

## 2023-05-13 MED ORDER — MAGNESIUM CITRATE PO SOLN
1.0000 | Freq: Once | ORAL | Status: DC
  Filled 2023-05-13: qty 296

## 2023-05-13 MED ORDER — ONDANSETRON HCL 4 MG/2ML IJ SOLN
INTRAMUSCULAR | Status: DC | PRN
  Administered 2023-05-13 (×2): 4 mg via INTRAVENOUS

## 2023-05-13 MED ORDER — CEFAZOLIN IN SODIUM CHLORIDE 3-0.9 GM/100ML-% IV SOLN
3.0000 g | INTRAVENOUS | Status: DC
  Filled 2023-05-13: qty 100

## 2023-05-13 MED ORDER — BUPIVACAINE LIPOSOME 1.3 % IJ SUSP
INTRAMUSCULAR | Status: DC | PRN
  Administered 2023-05-13: 40 mL

## 2023-05-13 MED ORDER — FENTANYL CITRATE PF 50 MCG/ML IJ SOSY
25.0000 ug | PREFILLED_SYRINGE | INTRAMUSCULAR | Status: DC | PRN
  Administered 2023-05-13: 50 ug via INTRAVENOUS

## 2023-05-13 MED ORDER — ONDANSETRON HCL 4 MG/2ML IJ SOLN
INTRAMUSCULAR | Status: AC
  Filled 2023-05-13: qty 2

## 2023-05-13 MED ORDER — OXYCODONE HCL 5 MG PO TABS: 5.0000 mg | ORAL_TABLET | Freq: Once | ORAL | Status: DC | PRN

## 2023-05-13 MED ORDER — PROPOFOL 10 MG/ML IV BOLUS
INTRAVENOUS | Status: DC | PRN
Start: 2023-05-13 — End: 2023-05-13
  Administered 2023-05-13: 200 mg via INTRAVENOUS

## 2023-05-13 MED ORDER — ALBUMIN HUMAN 5 % IV SOLN: INTRAVENOUS | Status: DC | PRN

## 2023-05-13 MED ORDER — OXYCODONE HCL 5 MG/5ML PO SOLN: 5.0000 mg | Freq: Once | ORAL | Status: DC | PRN

## 2023-05-13 MED ORDER — PHENYLEPHRINE HCL-NACL 20-0.9 MG/250ML-% IV SOLN
INTRAVENOUS | Status: DC | PRN
  Administered 2023-05-13: 15 ug/min via INTRAVENOUS

## 2023-05-13 MED ORDER — EPHEDRINE SULFATE-NACL 50-0.9 MG/10ML-% IV SOSY
PREFILLED_SYRINGE | INTRAVENOUS | Status: DC | PRN
Start: 2023-05-13 — End: 2023-05-13
  Administered 2023-05-13: 10 mg via INTRAVENOUS
  Administered 2023-05-13: 5 mg via INTRAVENOUS
  Administered 2023-05-13: 15 mg via INTRAVENOUS
  Administered 2023-05-13: 5 mg via INTRAVENOUS
  Administered 2023-05-13: 10 mg via INTRAVENOUS
  Administered 2023-05-13: 5 mg via INTRAVENOUS

## 2023-05-13 MED ORDER — SODIUM CHLORIDE (PF) 0.9 % IJ SOLN
INTRAMUSCULAR | Status: AC
  Filled 2023-05-13: qty 20

## 2023-05-13 MED ORDER — HYOSCYAMINE SULFATE 0.125 MG SL SUBL: 0.1250 mg | SUBLINGUAL_TABLET | SUBLINGUAL | Status: DC | PRN

## 2023-05-13 MED ORDER — LIDOCAINE HCL (CARDIAC) PF 100 MG/5ML IV SOSY
PREFILLED_SYRINGE | INTRAVENOUS | Status: DC | PRN
  Administered 2023-05-13: 80 mg via INTRAVENOUS

## 2023-05-13 MED ORDER — PHENYLEPHRINE 80 MCG/ML (10ML) SYRINGE FOR IV PUSH (FOR BLOOD PRESSURE SUPPORT)
PREFILLED_SYRINGE | INTRAVENOUS | Status: DC | PRN
Start: 2023-05-13 — End: 2023-05-13
  Administered 2023-05-13 (×3): 80 ug via INTRAVENOUS
  Administered 2023-05-13: 160 ug via INTRAVENOUS

## 2023-05-13 MED ORDER — FENTANYL CITRATE (PF) 250 MCG/5ML IJ SOLN
INTRAMUSCULAR | Status: AC
  Filled 2023-05-13: qty 5

## 2023-05-13 MED ORDER — STERILE WATER FOR INJECTION IJ SOLN
INTRAMUSCULAR | Status: DC | PRN
  Administered 2023-05-13: 1 mL

## 2023-05-13 MED ORDER — HYDROMORPHONE HCL 1 MG/ML IJ SOLN: 0.5000 mg | INTRAMUSCULAR | Status: DC | PRN

## 2023-05-13 MED ORDER — OXYCODONE HCL 5 MG PO TABS: 5.0000 mg | ORAL_TABLET | ORAL | Status: DC | PRN

## 2023-05-13 MED ORDER — DOCUSATE SODIUM 100 MG PO CAPS
100.0000 mg | ORAL_CAPSULE | Freq: Two times a day (BID) | ORAL | Status: DC
  Administered 2023-05-13 – 2023-05-14 (×2): 100 mg via ORAL
  Filled 2023-05-13 (×2): qty 1

## 2023-05-13 MED ORDER — ROCURONIUM BROMIDE 100 MG/10ML IV SOLN
INTRAVENOUS | Status: DC | PRN
  Administered 2023-05-13: 100 mg via INTRAVENOUS
  Administered 2023-05-13: 50 mg via INTRAVENOUS

## 2023-05-13 MED ORDER — DULOXETINE HCL 60 MG PO CPEP
60.0000 mg | ORAL_CAPSULE | Freq: Every morning | ORAL | Status: DC
  Administered 2023-05-14: 60 mg via ORAL
  Filled 2023-05-13: qty 1

## 2023-05-13 MED ORDER — ACETAMINOPHEN 10 MG/ML IV SOLN
INTRAVENOUS | Status: AC
  Filled 2023-05-13: qty 100

## 2023-05-13 MED ORDER — DIPHENHYDRAMINE HCL 12.5 MG/5ML PO ELIX: 12.5000 mg | ORAL_SOLUTION | Freq: Four times a day (QID) | ORAL | Status: DC | PRN

## 2023-05-13 MED ORDER — ORAL CARE MOUTH RINSE: 15.0000 mL | Freq: Once | OROMUCOSAL | Status: AC

## 2023-05-13 MED ORDER — MIDAZOLAM HCL 5 MG/5ML IJ SOLN
INTRAMUSCULAR | Status: DC | PRN
  Administered 2023-05-13: 2 mg via INTRAVENOUS

## 2023-05-13 MED ORDER — SODIUM CHLORIDE 0.9 % IR SOLN
Status: DC | PRN
  Administered 2023-05-13: 1000 mL

## 2023-05-13 MED ORDER — LACTATED RINGERS IV SOLN: INTRAVENOUS | Status: DC

## 2023-05-13 MED ORDER — BENAZEPRIL HCL 20 MG PO TABS
40.0000 mg | ORAL_TABLET | Freq: Every day | ORAL | Status: DC
  Administered 2023-05-14: 40 mg via ORAL
  Filled 2023-05-13: qty 2

## 2023-05-13 MED ORDER — ACETAMINOPHEN 10 MG/ML IV SOLN
1000.0000 mg | Freq: Once | INTRAVENOUS | Status: DC | PRN
  Administered 2023-05-13: 1000 mg via INTRAVENOUS

## 2023-05-13 MED ORDER — ONDANSETRON HCL 4 MG/2ML IJ SOLN: 4.0000 mg | Freq: Once | INTRAMUSCULAR | Status: DC | PRN

## 2023-05-13 MED ORDER — TRIPLE ANTIBIOTIC 3.5-400-5000 EX OINT: 1.0000 | TOPICAL_OINTMENT | Freq: Three times a day (TID) | CUTANEOUS | Status: DC | PRN

## 2023-05-13 MED ORDER — 0.9 % SODIUM CHLORIDE (POUR BTL) OPTIME
TOPICAL | Status: DC | PRN
Start: 2023-05-13 — End: 2023-05-13
  Administered 2023-05-13: 1000 mL

## 2023-05-13 MED ORDER — SUGAMMADEX SODIUM 200 MG/2ML IV SOLN
INTRAVENOUS | Status: DC | PRN
  Administered 2023-05-13: 250 mg via INTRAVENOUS

## 2023-05-13 MED ORDER — PRAVASTATIN SODIUM 40 MG PO TABS
80.0000 mg | ORAL_TABLET | Freq: Every day | ORAL | Status: DC
  Administered 2023-05-14: 80 mg via ORAL
  Filled 2023-05-13: qty 2

## 2023-05-13 MED ORDER — AMLODIPINE BESYLATE 10 MG PO TABS
10.0000 mg | ORAL_TABLET | Freq: Every day | ORAL | Status: DC
  Administered 2023-05-14: 10 mg via ORAL
  Filled 2023-05-13: qty 1

## 2023-05-13 MED ORDER — BUPIVACAINE LIPOSOME 1.3 % IJ SUSP
INTRAMUSCULAR | Status: AC
  Filled 2023-05-13: qty 20

## 2023-05-13 MED ORDER — TAMSULOSIN HCL 0.4 MG PO CAPS
0.4000 mg | ORAL_CAPSULE | Freq: Every day | ORAL | Status: DC
  Administered 2023-05-14: 0.4 mg via ORAL
  Filled 2023-05-13: qty 1

## 2023-05-13 MED ORDER — GLYCOPYRROLATE 0.2 MG/ML IJ SOLN
INTRAMUSCULAR | Status: DC | PRN
  Administered 2023-05-13: .4 mg via INTRAVENOUS

## 2023-05-13 MED ORDER — LACTATED RINGERS IV SOLN: INTRAVENOUS | Status: DC | PRN

## 2023-05-13 MED ORDER — POTASSIUM CHLORIDE ER 10 MEQ PO TBCR
10.0000 meq | EXTENDED_RELEASE_TABLET | Freq: Two times a day (BID) | ORAL | Status: DC
  Administered 2023-05-13 – 2023-05-14 (×2): 10 meq via ORAL
  Filled 2023-05-13 (×4): qty 1

## 2023-05-13 MED ORDER — DEXTROSE 5 % IV SOLN
INTRAVENOUS | Status: DC | PRN
  Administered 2023-05-13: 3 g via INTRAVENOUS

## 2023-05-13 MED ORDER — DEXAMETHASONE SODIUM PHOSPHATE 4 MG/ML IJ SOLN
INTRAMUSCULAR | Status: DC | PRN
  Administered 2023-05-13: 5 mg via INTRAVENOUS

## 2023-05-13 MED ORDER — IOHEXOL 300 MG/ML  SOLN
INTRAMUSCULAR | Status: DC | PRN
  Administered 2023-05-13: 8 mL

## 2023-05-13 MED ORDER — SODIUM CHLORIDE 0.45 % IV SOLN: INTRAVENOUS | Status: DC

## 2023-05-13 MED ORDER — MIDAZOLAM HCL 2 MG/2ML IJ SOLN
INTRAMUSCULAR | Status: AC
  Filled 2023-05-13: qty 2

## 2023-05-13 MED ORDER — LORAZEPAM 1 MG PO TABS
1.0000 mg | ORAL_TABLET | Freq: Every day | ORAL | Status: DC
  Administered 2023-05-13 – 2023-05-14 (×2): 1 mg via ORAL
  Filled 2023-05-13 (×2): qty 1

## 2023-05-13 MED ORDER — EZETIMIBE 10 MG PO TABS
10.0000 mg | ORAL_TABLET | Freq: Every day | ORAL | Status: DC
  Administered 2023-05-14: 10 mg via ORAL
  Filled 2023-05-13: qty 1

## 2023-05-13 MED ORDER — HYDROCHLOROTHIAZIDE 12.5 MG PO TABS
12.5000 mg | ORAL_TABLET | Freq: Every day | ORAL | Status: DC
  Administered 2023-05-14: 12.5 mg via ORAL
  Filled 2023-05-13: qty 1

## 2023-05-13 MED ORDER — FENTANYL CITRATE PF 50 MCG/ML IJ SOSY
PREFILLED_SYRINGE | INTRAMUSCULAR | Status: AC
  Administered 2023-05-13: 25 ug via INTRAVENOUS
  Filled 2023-05-13: qty 2

## 2023-05-13 MED ORDER — CHLORHEXIDINE GLUCONATE 0.12 % MT SOLN
15.0000 mL | Freq: Once | OROMUCOSAL | Status: AC
  Administered 2023-05-13: 15 mL via OROMUCOSAL

## 2023-05-13 MED ORDER — FENTANYL CITRATE (PF) 100 MCG/2ML IJ SOLN
INTRAMUSCULAR | Status: DC | PRN
  Administered 2023-05-13: 150 ug via INTRAVENOUS

## 2023-05-13 MED ORDER — DOXAZOSIN MESYLATE 4 MG PO TABS
4.0000 mg | ORAL_TABLET | Freq: Every day | ORAL | Status: DC
  Administered 2023-05-13: 4 mg via ORAL
  Filled 2023-05-13: qty 1

## 2023-05-13 MED ORDER — BUPIVACAINE-EPINEPHRINE 0.25% -1:200000 IJ SOLN
INTRAMUSCULAR | Status: AC
  Filled 2023-05-13: qty 1

## 2023-05-13 MED ORDER — DIPHENHYDRAMINE HCL 50 MG/ML IJ SOLN: 12.5000 mg | Freq: Four times a day (QID) | INTRAMUSCULAR | Status: DC | PRN

## 2023-05-13 SURGICAL SUPPLY — 87 items
ADH SKN CLS APL DERMABOND .7 (GAUZE/BANDAGES/DRESSINGS) ×4
ADPR CATH UNV TPR FL F LL (CATHETERS)
APL PRP STRL LF DISP 70% ISPRP (MISCELLANEOUS) ×2
APL SWBSTK 6 STRL LF DISP (MISCELLANEOUS) ×2
APPLICATOR COTTON TIP 6 STRL (MISCELLANEOUS) ×2 IMPLANT
APPLICATOR COTTON TIP 6IN STRL (MISCELLANEOUS) ×2
BAG COUNTER SPONGE SURGICOUNT (BAG) IMPLANT
BAG SPNG CNTER NS LX DISP (BAG)
BAG URO CATCHER STRL LF (MISCELLANEOUS) ×2 IMPLANT
BASKET ZERO TIP NITINOL 2.4FR (BASKET) IMPLANT
BSKT STON RTRVL ZERO TP 2.4FR (BASKET)
CATH FOLEY 2WAY SLVR 18FR 30CC (CATHETERS) ×2 IMPLANT
CATH URETL OPEN END 6FR 70 (CATHETERS) IMPLANT
CHLORAPREP W/TINT 26 (MISCELLANEOUS) ×2 IMPLANT
CLIP LIGATING HEM O LOK PURPLE (MISCELLANEOUS) ×4 IMPLANT
CLIP LIGATING HEMO O LOK GREEN (MISCELLANEOUS) IMPLANT
CLOTH BEACON ORANGE TIMEOUT ST (SAFETY) ×2 IMPLANT
CONNECTOR CATH FOLEY FEMALE LL (CATHETERS) IMPLANT
COVER SURGICAL LIGHT HANDLE (MISCELLANEOUS) ×2 IMPLANT
COVER TIP SHEARS 8 DVNC (MISCELLANEOUS) ×2 IMPLANT
CUTTER ECHEON FLEX ENDO 45 340 (ENDOMECHANICALS) IMPLANT
DERMABOND ADVANCED .7 DNX12 (GAUZE/BANDAGES/DRESSINGS) ×2 IMPLANT
DRAIN CHANNEL RND F F (WOUND CARE) IMPLANT
DRAPE ARM DVNC X/XI (DISPOSABLE) ×8 IMPLANT
DRAPE C-ARM 42X120 X-RAY (DRAPES) IMPLANT
DRAPE COLUMN DVNC XI (DISPOSABLE) ×2 IMPLANT
DRIVER NDL LRG 8 DVNC XI (INSTRUMENTS) ×4 IMPLANT
DRIVER NDLE LRG 8 DVNC XI (INSTRUMENTS) ×4
DRSG TEGADERM 4X4.75 (GAUZE/BANDAGES/DRESSINGS) ×2 IMPLANT
ELECT REM PT RETURN 15FT ADLT (MISCELLANEOUS) ×2 IMPLANT
FORCEPS BPLR FENES DVNC XI (FORCEP) ×2 IMPLANT
FORCEPS BPLR LNG DVNC XI (INSTRUMENTS) ×2 IMPLANT
FORCEPS PROGRASP DVNC XI (FORCEP) ×2 IMPLANT
GAUZE 4X4 16PLY ~~LOC~~+RFID DBL (SPONGE) IMPLANT
GLOVE BIO SURGEON STRL SZ 6.5 (GLOVE) ×2 IMPLANT
GLOVE SURG LX STRL 7.5 STRW (GLOVE) ×4 IMPLANT
GOWN SRG XL LVL 4 BRTHBL STRL (GOWNS) ×2 IMPLANT
GOWN STRL NON-REIN XL LVL4 (GOWNS) ×2
GOWN STRL REUS W/ TWL XL LVL3 (GOWN DISPOSABLE) ×2 IMPLANT
GOWN STRL REUS W/TWL XL LVL3 (GOWN DISPOSABLE) ×2
GUIDEWIRE ANG ZIPWIRE 038X150 (WIRE) ×2 IMPLANT
GUIDEWIRE STR DUAL SENSOR (WIRE) IMPLANT
HOLDER FOLEY CATH W/STRAP (MISCELLANEOUS) ×2 IMPLANT
IRRIG SUCT STRYKERFLOW 2 WTIP (MISCELLANEOUS) ×2
IRRIGATION SUCT STRKRFLW 2 WTP (MISCELLANEOUS) ×2 IMPLANT
KIT PROCEDURE DVNC SI (MISCELLANEOUS) IMPLANT
KIT TURNOVER KIT A (KITS) IMPLANT
LOOP VESSEL MAXI BLUE (MISCELLANEOUS) IMPLANT
MANIFOLD NEPTUNE II (INSTRUMENTS) ×2 IMPLANT
NDL ASPIRATION 22 (NEEDLE) ×2 IMPLANT
NDL INSUFFLATION 14GA 120MM (NEEDLE) ×2 IMPLANT
NEEDLE ASPIRATION 22 (NEEDLE) ×2
NEEDLE INSUFFLATION 14GA 120MM (NEEDLE) ×2
PACK CYSTO (CUSTOM PROCEDURE TRAY) ×2 IMPLANT
PACK ROBOT UROLOGY CUSTOM (CUSTOM PROCEDURE TRAY) ×2 IMPLANT
PAD POSITIONING PINK XL (MISCELLANEOUS) ×2 IMPLANT
PORT ACCESS TROCAR AIRSEAL 12 (TROCAR) ×2 IMPLANT
SCISSORS LAP 5X35 DISP (ENDOMECHANICALS) IMPLANT
SCISSORS MNPLR CVD DVNC XI (INSTRUMENTS) ×2 IMPLANT
SEAL UNIV 5-12 XI (MISCELLANEOUS) ×8 IMPLANT
SET CYSTO W/LG BORE CLAMP LF (SET/KITS/TRAYS/PACK) IMPLANT
SET TRI-LUMEN FLTR TB AIRSEAL (TUBING) ×2 IMPLANT
SLEEVE ADV FIXATION 12X100MM (TROCAR) ×2 IMPLANT
SOL ELECTROSURG ANTI STICK (MISCELLANEOUS) ×2
SOLUTION ELECTROSURG ANTI STCK (MISCELLANEOUS) ×2 IMPLANT
SPIKE FLUID TRANSFER (MISCELLANEOUS) ×2 IMPLANT
SPONGE T-LAP 4X18 ~~LOC~~+RFID (SPONGE) ×2 IMPLANT
STENT URET 6FRX26 CONTOUR (STENTS) IMPLANT
SUT ETHILON 3 0 PS 1 (SUTURE) ×2 IMPLANT
SUT MNCRL AB 4-0 PS2 18 (SUTURE) ×4 IMPLANT
SUT NOVA NAB GS-21 0 18 T12 DT (SUTURE) IMPLANT
SUT PDS AB 0 CT1 36 (SUTURE) ×6 IMPLANT
SUT V-LOC BARB 180 2/0GR6 GS22 (SUTURE) ×2
SUT VICRYL 0 27 CT2 27 ABS (SUTURE) IMPLANT
SUT VICRYL 0 UR6 27IN ABS (SUTURE) ×2 IMPLANT
SUT VLOC BARB 180 ABS3/0GR12 (SUTURE) ×2
SUTURE V-LC BRB 180 2/0GR6GS22 (SUTURE) IMPLANT
SUTURE VLOC BRB 180 ABS3/0GR12 (SUTURE) IMPLANT
SYS BAG RETRIEVAL 10MM (BASKET)
SYSTEM BAG RETRIEVAL 10MM (BASKET) IMPLANT
TOWEL OR NON WOVEN STRL DISP B (DISPOSABLE) ×2 IMPLANT
TROCAR ADV FIXATION 12X100MM (TROCAR) ×2 IMPLANT
TROCAR Z-THREAD FIOS 5X100MM (TROCAR) IMPLANT
TUBE PU 8FR 16IN ENFIT (TUBING) IMPLANT
TUBING CONNECTING 10 (TUBING) ×2 IMPLANT
TUBING UROLOGY SET (TUBING) IMPLANT
WATER STERILE IRR 1000ML POUR (IV SOLUTION) ×2 IMPLANT

## 2023-05-13 NOTE — Transfer of Care (Signed)
Immediate Anesthesia Transfer of Care Note  Patient: Darren Black  Procedure(s) Performed: XI ROBOTIC ASSISTED LAPAROSCOPIC PARTIAL CYSTECTOMY, OPNE UMBILICAL HERNIA REPAIR CYSTOSCOPY WITH RETROGRADE PYELOGRAM/URETERAL STENT PLACEMENT AND INDOCYANINE GREEN DYE INJECTION (Left)  Patient Location: PACU  Anesthesia Type:General  Level of Consciousness: awake, alert , and oriented  Airway & Oxygen Therapy: Patient Spontanous Breathing and Patient connected to face mask oxygen  Post-op Assessment: Report given to RN, Post -op Vital signs reviewed and stable, and Patient moving all extremities X 4  Post vital signs: Reviewed and stable  Last Vitals:  Vitals Value Taken Time  BP 128/87 05/13/23 1636  Temp    Pulse 65 05/13/23 1641  Resp 19 05/13/23 1641  SpO2 99 % 05/13/23 1641  Vitals shown include unfiled device data.  Last Pain:  Vitals:   05/13/23 0949  TempSrc: Oral  PainSc: 0-No pain      Patients Stated Pain Goal: 4 (05/13/23 0949)  Complications: No notable events documented.

## 2023-05-13 NOTE — Progress Notes (Signed)
   05/13/23 2141  BiPAP/CPAP/SIPAP  BiPAP/CPAP/SIPAP Pt Type Adult  BiPAP/CPAP/SIPAP DREAMSTATIOND  Mask Type Nasal mask (from home)  FiO2 (%) 21 %  Patient Home Equipment No (mask and tubing from home)  Auto Titrate Yes (6-20)  BiPAP/CPAP /SiPAP Vitals  Pulse Rate 87  Resp 18  SpO2 99 %  Bilateral Breath Sounds Clear;Diminished  MEWS Score/Color  MEWS Score 0  MEWS Score Color Green

## 2023-05-13 NOTE — Discharge Instructions (Addendum)
1- Drain Sites - You may have some mild persistent drainage from old drain site for several days, this is normal. This can be covered with cotton gauze for convenience.  2 - Stiches - Your stitches are all dissolvable. You may notice a "loose thread" at your incisions, these are normal and require no intervention. You may cut them flush to the skin with fingernail clippers if needed for comfort.  3 - Diet - No restrictions  4 - Activity - No heavy lifting / straining (any activities that require valsalva or "bearing down") x 4 weeks. Otherwise, no restrictions.  5 - Bathing - You may shower immediately. Do not take a bath or get into swimming pool where incision sites are submersed in water x 4 weeks.   6 - Catheter - Will remain in place until removed at your next appointment. It may be cleaned with soap and water in the shower. It may be disconnected from the drain bag while in the shower to avoid tripping over the tube. You may apply Neosporin or Vaseline ointment as needed to the tip of the penis where the catheter inserts to reduce friction and irritation in this spot.   7 - When to Call the Doctor - Call MD for any fever >102, any acute wound problems, or any severe nausea / vomiting. You can call the [facility] [phone]) 24 hours a day 365 days a year. It will roll-over to the answering service and on-call physician after hours.   You may resume aspirin, advil, aleve, vitamins, and supplements 7 days after surgery.  You may resume Semaglutide 2 weeks after surgery.

## 2023-05-13 NOTE — H&P (Signed)
Darren Black is an 57 y.o. male.    Chief Complaint: Pre-OP Partial Cystectomy / Left ureteral stent  HPI:   1 - Large Left Bladder Diverticulum, Bacteruria, Retention - at least capacit diveriticulum on imaging x many. Mouth appears superior lateral to left UO but close. Manages with alpha blockers and prostate NOT enlarged at 35gm buy CT ellipsoid calculation 2023. Most recent UCX strep.   2 - Lower Urinary Tract Symtpom - on doxazosin at [place]. PVR's normall range.   3 - Urethral Stricure - h/o stricture req dilation in 2016 per report.   4 - Prostate Screening - [name] brother with prostate cancer  12/2022 - PSA 1.04 (recent PCP labs)   PMH sig for obestiy, DM2 (A1c <6), open appy, lap chole. 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 His PCP is Oliver Barre MD.   Today " Darren Black " is seen to proceed with cysto / LEFT stent and partial cystectomy for large bladder diverticulum with chronic bacteruria. Cr 1.4, Hgb 17. No interval fevers. Most recent UA without infectious parameters.    Past Medical History:  Diagnosis Date   Anxiety    BPH (benign prostatic hypertrophy)    Chronic lower back pain    nerve damage w/ leg pain   Depression    Diarrhea 03/23/2019   Family history of adverse reaction to anesthesia    MOTHER--- HARD TO WAKE   GERD (gastroesophageal reflux disease)    History of panic attacks    Hyperlipidemia    Hypertension    Meatal stenosis    OSA on CPAP    moderate to severe OSA  per study 04-28-2007 uses CPAP   PONV (postoperative nausea and vomiting)    Hard to wake up    Past Surgical History:  Procedure Laterality Date   APPENDECTOMY  2003   BREATH TEK H PYLORI  04/27/2012   Procedure: BREATH TEK H PYLORI;  Surgeon: Valarie Merino, MD;  Location: [facility];  Service: General;  Laterality: N/A;   CHOLECYSTECTOMY N/A 03/24/2019   Procedure: LAPAROSCOPIC CHOLECYSTECTOMY WITH  INTRAOPERATIVE CHOLANGIOGRAM;  Surgeon: Manus Rudd, MD;  Location: [facility];  Service: General;  Laterality: N/A;   CYSTOSCOPY WITH URETHRAL DILATATION N/A 04/12/2015   Procedure: CYSTOSCOPY WITH URETHRAL DILATATION, RETROGRADE AND URETHROGRAM WITH BLADDER BIOPSY;  Surgeon: Bjorn Pippin, MD;  Location: [facility];  Service: Urology;  Laterality: N/A;   ROTATOR CUFF REPAIR Right 01-21-2012   TRANSTHORACIC ECHOCARDIOGRAM  03-26-2007   normal LVSF, ef 60%,  mild AV calcification without stenosis,  mild MR and TR,  mild LAE   URETHROGRAM N/A 04/12/2015   Procedure: URETHROGRAM;  Surgeon: Bjorn Pippin, MD;  Location: [facility];  Service: Urology;  Laterality: N/A;    Family History  Problem Relation Age of Onset   Coronary artery disease Father    Cancer Father        anaplastic thyroid cancer   Stroke Other        1st degree relative   Diabetes Other        1st degree relative   Hypertension Other    Hyperlipidemia Other    Social History:  reports that he has never smoked. He has never used smokeless tobacco. He reports current alcohol use. He reports that he does not use drugs.  Allergies:  Allergies  Allergen Reactions   Gabapentin Swelling   Crestor [employer]  Calcium] Other (See Comments)    abd pain    No medications prior to admission.    No results found for this or any previous visit (from the past 48 hour(s)). No results found.  Review of Systems  All other systems reviewed and are negative.   There were no vitals taken for this visit. Physical Exam Vitals reviewed.  HENT:     Head: Normocephalic.     Nose: Nose normal.  Eyes:     Pupils: Pupils are equal, round, and reactive to light.  Cardiovascular:     Rate and Rhythm: Normal rate.  Pulmonary:     Effort: Pulmonary effort is normal.  Abdominal:     General: Abdomen is flat.  Genitourinary:    Comments: No CVAT at present Musculoskeletal:        General: Normal range  of motion.     Cervical back: Normal range of motion.  Skin:    General: Skin is warm.  Neurological:     General: No focal deficit present.     Mental Status: He is alert.  Psychiatric:        Mood and Affect: Mood normal.      Assessment/Plan  Proceed as planned with cysto, left stent, partial cystectomy with goal of removing large diverticulum / more efficient bladder emptying / less infectious risk. Risks, benefits, alternatives, expected peri-op course discussed previously and reiterated today.   Loletta Parish., MD 05/13/2023, 6:40 AM

## 2023-05-13 NOTE — Anesthesia Preprocedure Evaluation (Signed)
Anesthesia Evaluation  Patient identified by MRN, date of birth, ID band Patient awake    Reviewed: Allergy & Precautions, NPO status , Patient's Chart, lab work & pertinent test results, reviewed documented beta blocker date and time   History of Anesthesia Complications (+) PONV, Family history of anesthesia reaction and history of anesthetic complications  Airway Mallampati: IV  TM Distance: >3 FB Neck ROM: Full    Dental no notable dental hx.    Pulmonary sleep apnea and Continuous Positive Airway Pressure Ventilation , neg COPD, neg PE   breath sounds clear to auscultation       Cardiovascular hypertension, (-) angina (-) CAD, (-) Past MI, (-) Cardiac Stents, (-) CABG and (-) CHF  Rhythm:Regular Rate:Normal     Neuro/Psych neg Seizures PSYCHIATRIC DISORDERS Anxiety Depression     Neuromuscular disease    GI/Hepatic ,GERD  ,,(+) neg Cirrhosis        Endo/Other  neg diabetes    Renal/GU CRFRenal disease     Musculoskeletal   Abdominal   Peds  Hematology   Anesthesia Other Findings   Reproductive/Obstetrics                             Anesthesia Physical Anesthesia Plan  ASA: 2  Anesthesia Plan: General   Post-op Pain Management:    Induction: Intravenous  PONV Risk Score and Plan: 1 and Ondansetron  Airway Management Planned: Oral ETT and Video Laryngoscope Planned  Additional Equipment:   Intra-op Plan:   Post-operative Plan: Extubation in OR  Informed Consent: I have reviewed the patients History and Physical, chart, labs and discussed the procedure including the risks, benefits and alternatives for the proposed anesthesia with the patient or authorized representative who has indicated his/her understanding and acceptance.     Dental advisory given  Plan Discussed with: CRNA  Anesthesia Plan Comments:        Anesthesia Quick Evaluation

## 2023-05-13 NOTE — Anesthesia Postprocedure Evaluation (Signed)
Anesthesia Post Note  Patient: Darren Black  Procedure(s) Performed: XI ROBOTIC ASSISTED LAPAROSCOPIC PARTIAL CYSTECTOMY, OPNE UMBILICAL HERNIA REPAIR CYSTOSCOPY WITH RETROGRADE PYELOGRAM/URETERAL STENT PLACEMENT AND INDOCYANINE GREEN DYE INJECTION (Left)     Patient location during evaluation: PACU Anesthesia Type: General Level of consciousness: awake and alert Pain management: pain level controlled Vital Signs Assessment: post-procedure vital signs reviewed and stable Respiratory status: spontaneous breathing, nonlabored ventilation, respiratory function stable and patient connected to nasal cannula oxygen Cardiovascular status: blood pressure returned to baseline and stable Postop Assessment: no apparent nausea or vomiting Anesthetic complications: no   No notable events documented.  Last Vitals:  Vitals:   05/13/23 1700 05/13/23 1715  BP: (!) 132/92 128/88  Pulse: 67 66  Resp: 11 18  Temp:    SpO2: 94% 95%    Last Pain:  Vitals:   05/13/23 1715  TempSrc:   PainSc: 7                  Mariann Barter

## 2023-05-13 NOTE — Brief Op Note (Signed)
05/13/2023  4:18 PM  PATIENT:  Darren Black  57 y.o. male  PRE-OPERATIVE DIAGNOSIS:  BLADDER DIVERTICULUM  POST-OPERATIVE DIAGNOSIS:  BLADDER DIVERTICULUM, Umbilical Hernia  PROCEDURE:  Procedure(s): XI ROBOTIC ASSISTED LAPAROSCOPIC PARTIAL CYSTECTOMY, OPNE UMBILICAL HERNIA REPAIR (N/A) CYSTOSCOPY WITH RETROGRADE PYELOGRAM/URETERAL STENT PLACEMENT AND INDOCYANINE GREEN DYE INJECTION (Left)  SURGEON:  Surgeons and Role:    * Livana Yerian, Delbert Phenix., MD - Primary  PHYSICIAN ASSISTANT:   ASSISTANTS: Harrie Foreman PA   ANESTHESIA:   local and general  EBL:  minimal   BLOOD ADMINISTERED:none  DRAINS: (1) Jackson-Pratt drain(s) with closed bulb suction in the pelvis; Foley to gravity    LOCAL MEDICATIONS USED:  MARCAINE     SPECIMEN:  Source of Specimen:  bladder diverticulum  DISPOSITION OF SPECIMEN:  PATHOLOGY  COUNTS:  YES  TOURNIQUET:  * No tourniquets in log *  DICTATION: .Other Dictation: Dictation Number [id number]  PLAN OF CARE: Admit for overnight observation  PATIENT DISPOSITION:  PACU - hemodynamically stable.   Delay start of Pharmacological VTE agent (>24hrs) due to surgical blood loss or risk of bleeding: yes

## 2023-05-13 NOTE — Anesthesia Procedure Notes (Signed)
Procedure Name: Intubation Date/Time: 05/13/2023 2:14 PM  Performed by: Deri Fuelling, CRNAPre-anesthesia Checklist: Patient identified, Emergency Drugs available, Suction available and Patient being monitored Patient Re-evaluated:Patient Re-evaluated prior to induction Oxygen Delivery Method: Circle system utilized Preoxygenation: Pre-oxygenation with 100% oxygen Induction Type: IV induction Ventilation: Mask ventilation without difficulty Laryngoscope Size: Glidescope and 4 Grade View: Grade I Tube type: Oral Tube size: 7.5 mm Number of attempts: 1 Airway Equipment and Method: Stylet and Oral airway Placement Confirmation: ETT inserted through vocal cords under direct vision, positive ETCO2 and breath sounds checked- equal and bilateral Secured at: 23 cm Tube secured with: Tape Dental Injury: Teeth and Oropharynx as per pre-operative assessment

## 2023-05-14 ENCOUNTER — Encounter (HOSPITAL_COMMUNITY): Payer: Self-pay | Admitting: Urology

## 2023-05-14 ENCOUNTER — Other Ambulatory Visit: Payer: Self-pay

## 2023-05-14 DIAGNOSIS — Z8042 Family history of malignant neoplasm of prostate: Secondary | ICD-10-CM | POA: Diagnosis not present

## 2023-05-14 DIAGNOSIS — Z8249 Family history of ischemic heart disease and other diseases of the circulatory system: Secondary | ICD-10-CM | POA: Diagnosis not present

## 2023-05-14 DIAGNOSIS — E119 Type 2 diabetes mellitus without complications: Secondary | ICD-10-CM | POA: Diagnosis not present

## 2023-05-14 DIAGNOSIS — F32A Depression, unspecified: Secondary | ICD-10-CM | POA: Diagnosis not present

## 2023-05-14 DIAGNOSIS — E669 Obesity, unspecified: Secondary | ICD-10-CM | POA: Diagnosis not present

## 2023-05-14 DIAGNOSIS — Z808 Family history of malignant neoplasm of other organs or systems: Secondary | ICD-10-CM | POA: Diagnosis not present

## 2023-05-14 DIAGNOSIS — G4733 Obstructive sleep apnea (adult) (pediatric): Secondary | ICD-10-CM | POA: Diagnosis present

## 2023-05-14 DIAGNOSIS — K429 Umbilical hernia without obstruction or gangrene: Secondary | ICD-10-CM | POA: Diagnosis present

## 2023-05-14 DIAGNOSIS — I10 Essential (primary) hypertension: Secondary | ICD-10-CM | POA: Diagnosis not present

## 2023-05-14 DIAGNOSIS — Z833 Family history of diabetes mellitus: Secondary | ICD-10-CM | POA: Diagnosis not present

## 2023-05-14 DIAGNOSIS — Z6841 Body Mass Index (BMI) 40.0 and over, adult: Secondary | ICD-10-CM | POA: Diagnosis not present

## 2023-05-14 DIAGNOSIS — Z823 Family history of stroke: Secondary | ICD-10-CM | POA: Diagnosis not present

## 2023-05-14 DIAGNOSIS — K219 Gastro-esophageal reflux disease without esophagitis: Secondary | ICD-10-CM | POA: Diagnosis present

## 2023-05-14 DIAGNOSIS — E785 Hyperlipidemia, unspecified: Secondary | ICD-10-CM | POA: Diagnosis not present

## 2023-05-14 DIAGNOSIS — N400 Enlarged prostate without lower urinary tract symptoms: Secondary | ICD-10-CM | POA: Diagnosis not present

## 2023-05-14 DIAGNOSIS — N323 Diverticulum of bladder: Secondary | ICD-10-CM | POA: Diagnosis not present

## 2023-05-14 LAB — BASIC METABOLIC PANEL
Anion gap: 10 (ref 5–15)
BUN: 18 mg/dL (ref 6–20)
CO2: 23 mmol/L (ref 22–32)
Calcium: 8.3 mg/dL — ABNORMAL LOW (ref 8.9–10.3)
Chloride: 101 mmol/L (ref 98–111)
Creatinine, Ser: 1.48 mg/dL — ABNORMAL HIGH (ref 0.61–1.24)
GFR, Estimated: 55 mL/min — ABNORMAL LOW (ref >60)
Glucose, Bld: 152 mg/dL — ABNORMAL HIGH (ref 70–99)
Potassium: 3.7 mmol/L (ref 3.5–5.1)
Sodium: 134 mmol/L — ABNORMAL LOW (ref 135–145)

## 2023-05-14 LAB — HEMOGLOBIN AND HEMATOCRIT, BLOOD
HCT: 46.3 % (ref 39.0–52.0)
Hemoglobin: 15.7 g/dL (ref 13.0–17.0)

## 2023-05-14 MED ORDER — CHLORHEXIDINE GLUCONATE CLOTH 2 % EX PADS: 6.0000 | MEDICATED_PAD | Freq: Every day | CUTANEOUS | Status: DC

## 2023-05-14 MED ORDER — CALCIUM CARBONATE ANTACID 500 MG PO CHEW
1.0000 | CHEWABLE_TABLET | ORAL | Status: DC | PRN
  Administered 2023-05-14: 200 mg via ORAL
  Filled 2023-05-14: qty 1

## 2023-05-14 NOTE — Discharge Summary (Signed)
Date of admission: 05/13/2023  Date of discharge: 05/14/2023  Admission diagnosis: bladder diverticulum  Discharge diagnosis: bladder diverticulum, umbilical hernia  Secondary diagnoses:  Patient Active Problem List   Diagnosis Date Noted   Bladder diverticulum 05/13/2023   Superficial phlebitis 01/15/2023   Chronic pain syndrome 10/30/2021   Psoriasis 07/08/2021   Other seborrheic keratosis 07/08/2021   Nerve pain due to spinal stenosis 03/22/2021   Neck pain, chronic 03/22/2021   Aortic atherosclerosis (HCC) 01/01/2021   Sinusitis 11/24/2020   Diarrhea 03/29/2019   Tachycardia 03/29/2019   Hypokalemia 03/28/2019   Cholecystitis 03/25/2019   Abdominal pain 03/23/2019   AKI (acute kidney injury) (HCC) 03/23/2019   Hypotension 03/23/2019   Weight loss 03/15/2019   Hyperglycemia 07/28/2017   Increased prostate specific antigen (PSA) velocity 07/08/2016   Urinary retention 07/04/2015   CKD (chronic kidney disease) stage 3, GFR 30-59 ml/min (HCC) 01/03/2014   Chronic lower back pain 10/04/2013   Right lumbar radiculopathy 01/31/2013   Right shoulder pain 01/07/2012   Knee abrasion 11/08/2011   Morbid obesity (HCC) 08/28/2011   Encounter for well adult exam with abnormal findings 04/22/2011   Hyperlipidemia 10/09/2009   FATIGUE 07/13/2009   HYPOPITUITARISM 05/15/2009   Male hypogonadism 04/20/2009   LIBIDO, DECREASED 04/11/2009   PERIPHERAL EDEMA 02/08/2009   SKIN LESION 04/12/2008   HSV 01/11/2008   ORAL ULCER 01/11/2008   Allergic rhinitis 07/13/2007   Obstructive sleep apnea 05/29/2007   Anxiety state 05/28/2007   Depression 05/28/2007   Essential hypertension 05/28/2007   URINARY TRACT INFECTION, HX OF 05/28/2007    Procedures performed: Procedure(s): XI ROBOTIC ASSISTED LAPAROSCOPIC PARTIAL CYSTECTOMY, OPNE UMBILICAL HERNIA REPAIR CYSTOSCOPY WITH RETROGRADE PYELOGRAM/URETERAL STENT PLACEMENT AND INDOCYANINE GREEN DYE INJECTION  History and Physical: For full  details, please see admission history and physical. Briefly, Darren Black is a 57 y.o. year old patient with a history of 300cc bladder diverticulum with LUTS. He underwent left ureteral stent placement and robotic diverticulectomy with umbilical hernia repair on 05/13/23.   Hospital Course: Patient tolerated the procedure well.  He was then transferred to the floor after an uneventful PACU stay.  His hospital course was uncomplicated.  On POD#1 he had met discharge criteria: was eating a regular diet, was up and ambulating independently,  pain was well controlled, and was ready to for discharge. He will continue his foley catheter until cystogram scheduled for 05/25/23.    Laboratory values:  Recent Labs    05/13/23 1824 05/14/23 0401  HGB 16.2 15.7  HCT 49.6 46.3   Recent Labs    05/14/23 0401  NA 134*  K 3.7  CL 101  CO2 23  GLUCOSE 152*  BUN 18  CREATININE 1.48*  CALCIUM 8.3*   No results for input(s): "LABPT", "INR" in the last 72 hours. No results for input(s): "LABURIN" in the last 72 hours. Results for orders placed or performed during the hospital encounter of 05/29/22  Gastrointestinal Panel by PCR , Stool     Status: None   Collection Time: 05/29/22  2:28 PM   Specimen: Stool  Result Value Ref Range Status   Campylobacter species NOT DETECTED NOT DETECTED Final   Plesimonas shigelloides NOT DETECTED NOT DETECTED Final   Salmonella species NOT DETECTED NOT DETECTED Final   Yersinia enterocolitica NOT DETECTED NOT DETECTED Final   Vibrio species NOT DETECTED NOT DETECTED Final   Vibrio cholerae NOT DETECTED NOT DETECTED Final   Enteroaggregative E coli (EAEC) NOT DETECTED NOT DETECTED Final  Enteropathogenic E coli (EPEC) NOT DETECTED NOT DETECTED Final   Enterotoxigenic E coli (ETEC) NOT DETECTED NOT DETECTED Final   Shiga like toxin producing E coli (STEC) NOT DETECTED NOT DETECTED Final   Shigella/Enteroinvasive E coli (EIEC) NOT DETECTED NOT DETECTED Final    Cryptosporidium NOT DETECTED NOT DETECTED Final   Cyclospora cayetanensis NOT DETECTED NOT DETECTED Final   Entamoeba histolytica NOT DETECTED NOT DETECTED Final   Giardia lamblia NOT DETECTED NOT DETECTED Final   Adenovirus F40/41 NOT DETECTED NOT DETECTED Final   Astrovirus NOT DETECTED NOT DETECTED Final   Norovirus GI/GII NOT DETECTED NOT DETECTED Final   Rotavirus A NOT DETECTED NOT DETECTED Final   Sapovirus (I, II, IV, and V) NOT DETECTED NOT DETECTED Final    Comment: Performed at [facility], [address], [place], [place] [address]    Physical Exam  Gen: NAD Resp: Satting well on RA Card: Regular rate Abd: Soft, appropriately tender, ND, incisions clean dry and intact GU:  Foley catheter in place draining clear yellow urine. Neuro: Alert   Disposition: Home  Discharge instruction: The patient was instructed to be ambulatory but told to refrain from heavy lifting, strenuous activity, or driving.   Discharge medications:  Allergies as of 05/14/2023       Reactions   Gabapentin Swelling   Crestor [rosuvastatin Calcium] Other (See Comments)   abd pain        Medication List     STOP taking these medications    aspirin EC 81 MG tablet   COLLAGEN PO   COQ10 PO   D3 5000 PO   DHEA PO   Fish Oil 1200 MG Caps   GARLIC PO   Ginger Root 550 MG Caps   HYALURONIC ACID PO   LYCOPENE PO   MILK THISTLE PO   multivitamin with minerals tablet   OVER THE COUNTER MEDICATION   OVER THE COUNTER MEDICATION   OVER THE COUNTER MEDICATION   Prostate Caps   Saw Palmetto 1000 MG Caps   Semaglutide (1 MG/DOSE) 4 MG/3ML Sopn   Turmeric 500 MG Caps   vitamin C 1000 MG tablet       TAKE these medications    amLODipine 10 MG tablet Commonly known as: NORVASC Take 1 tablet by mouth once daily   benazepril 40 MG tablet Commonly known as: LOTENSIN Take 1 tablet by mouth once daily   docusate sodium 100 MG capsule Commonly known as:  COLACE Take 100 mg by mouth 2 (two) times daily.   doxazosin 4 MG tablet Commonly known as: CARDURA TAKE 1 TABLET BY MOUTH AT BEDTIME   DULoxetine 60 MG capsule Commonly known as: CYMBALTA Take 1 capsule by mouth once daily in the morning   ezetimibe 10 MG tablet Commonly known as: ZETIA Take 1 tablet by mouth once daily   fluticasone 50 MCG/ACT nasal spray Commonly known as: FLONASE Use 2 spray(s) in each nostril once daily   hydrochlorothiazide 25 MG tablet Commonly known as: HYDRODIURIL Take 1/2 (one-half) tablet by mouth once daily   KLS ALLER-TEC PO Take 10 mg by mouth daily.   LORazepam 1 MG tablet Commonly known as: ATIVAN Take 1 tablet by mouth twice daily as needed for anxiety What changed: when to take this   oxyCODONE-acetaminophen 5-325 MG tablet Commonly known as: PERCOCET/ROXICET Take 1 tablet by mouth every 6 (six) hours as needed for severe pain. G89.4 Chronic pain- What changed:  when to take this additional instructions  potassium chloride 10 MEQ tablet Commonly known as: KLOR-CON Take 2 tablets (20 mEq total) by mouth daily. What changed:  how much to take when to take this   pravastatin 80 MG tablet Commonly known as: PRAVACHOL Take 1 tablet (80 mg total) by mouth daily.   Protein Powd Take 1 Scoop by mouth daily as needed (instead of meal). Also use protein drink or bar   tamsulosin 0.4 MG Caps capsule Commonly known as: FLOMAX Take 1 capsule by mouth once daily   testosterone cypionate 200 MG/ML injection Commonly known as: DEPOTESTOSTERONE CYPIONATE Inject 200 mg into the muscle once a week.   Zepbound 2.5 MG/0.5ML Pen Generic drug: tirzepatide Inject 2.5 mg into the skin once a week. R73.03 prediabetes        Followup:   Follow-up Information     Berneice Heinrich Delbert Phenix., MD Follow up on 05/25/2023.   Specialty: Urology Why: at 8 AM for MD visit, X-Ray, and catheter removal. Contact information: [address] [place]  [place] [address] [phone]

## 2023-05-14 NOTE — TOC CM/SW Note (Signed)
Transition of Care [facility]) - Inpatient Brief Assessment   Patient Details  Name: Darren Black MRN: [id number] Date of Birth: 1966/08/10  Transition of Care [facility]) CM/SW Contact:    Howell Rucks, RN Phone Number: 05/14/2023, 10:57 AM   Clinical Narrative: Met with pt at bedside to introduce role of TOC/NCM and review for dc planning. Pt reports he has a PCP and pharmacy in place, no current home care services, reports he has a CPAP, grab bars in shower and close to toilet. Pt reports she feels safe returning home with support from his spouse, spouse to provide transportation at discharge. TOC Brief Assessment completed. No TOC needs identified.     Transition of Care Asessment: Insurance and Status: Insurance coverage has been reviewed Patient has primary care physician: Yes Home environment has been reviewed: resides in private residence with spouse Prior level of function:: Independent Prior/Current Home Services: No current home services Social Determinants of Health Reivew: SDOH reviewed no interventions necessary Readmission risk has been reviewed: Yes Transition of care needs: no transition of care needs at this time

## 2023-05-14 NOTE — Plan of Care (Signed)
  Problem: Bowel/Gastric: Goal: Gastrointestinal status for postoperative course will improve Outcome: Progressing   Problem: Clinical Measurements: Goal: Postoperative complications will be avoided or minimized Outcome: Progressing   Problem: Skin Integrity: Goal: Demonstration of wound healing without infection will improve Outcome: Progressing   Problem: Urinary Elimination: Goal: Ability to avoid or minimize complications of infection will improve Outcome: Progressing   Problem: Activity: Goal: Risk for activity intolerance will decrease Outcome: Progressing

## 2023-05-14 NOTE — Op Note (Signed)
NAME: Darren Black, [place] MEDICAL RECORD NO: [id number] ACCOUNT NO: [id number] DATE OF BIRTH: 14-Mar-1966 FACILITY: [facility] LOCATION: [id number] PHYSICIAN: Sebastian Ache, MD  Operative Report   DATE OF PROCEDURE: 05/13/2023  PREOPERATIVE DIAGNOSIS:  Large bladder diverticulum  POSTOPERATIVE DIAGNOSES:  Large bladder diverticulum plus umbilical hernia.  PROCEDURE:  1.  Cystoscopy with left retrograde pyelogram interpretation 2.  Insertion of left ureteral stent. 3.  Robotic-assisted laparoscopic partial cystectomy. 4.  Open umbilical hernia repair.  ESTIMATED BLOOD LOSS:  Nil.  COMPLICATIONS:  None.  SPECIMEN: Bladder diverticulum for permanent pathology.  FINDINGS:  1.  Smooth walled simple periureteral left bladder diverticulum. Mouth of diverticulum approximately 2.5 cm superior and lateral to the left ureteral orifice. 2.  Unremarkable left retrograde pyelogram. 3.  Successful placement of left ureteral stent, proximal end in renal pelvis, distal end in urinary bladder. 4.  Approximately 1.5 cm fat-containing umbilical hernia.  INDICATIONS:  The patient is a pleasant 57 year old man.  He was found on workup of incomplete bladder emptying and recurrent infections to have a significant periureteral diverticulum, estimated volume approximately 300 mL. He has minimal outflow  obstruction and normal size prostate suggesting diverticulum was indeed primary. Options discussed for management including observation versus medical regimen versus definitive management with partial cystectomy.  He wished to proceed with the latter with goal of  reducing infection risk.  His preoperative imaging and evaluation showed that the mouth of diverticulum was very close to the left ureteral orifice.  It was clearly felt that concomitant retrograde and stenting would be warranted to identify and protect  the left distal ureter and ureterovesical junction.  Informed consent was obtained and placed in medical  record.  PROCEDURE IN DETAIL:  The patient being verified, procedure being cystoscopy, left retrograde, left stent placement and robotic partial cystectomy was confirmed.  Procedure timeout was performed.  Intravenous antibiotics were administered.  General  endotracheal anesthesia induced.  The patient was placed into a low lithotomy position.  Sterile field was created, prepped and draped the patient's penis, perineum, and proximal thighs using iodine and his infraxiphoid using chlorhexidine gluconate  after clipper shaving, after he was further fastened to operating table using 3-inch tape over foam padding across supraxiphoid chest.  Test of steep Trendelenburg positioning was performed and found to be suitably positioned.  Bed was made to be level.   An LAVH type drape was applied.  Attention was directed at cystoscopy with retrograde and stent placement.  Cystourethroscopy was initially performed using 21-French rigid cystoscope with offset lens.  Inspection of anterior and posterior urethra  revealed a high riding bladder neck, but otherwise unremarkable.  Inspection of urinary bladder revealed single ureteral orifices.  There was a diverticulum mouth, approximately 2.5 cm superior lateral left ureteral orifice and a smooth walled  diverticulum was visualized through this.  The left ureteral orifice was then cannulated with 6-French end-hole catheter, and a left retrograde pyelogram was obtained.  Left retrograde pyelogram demonstrated single left ureter, single system left kidney.  No filling defects or narrowing noted.  A ZIPwire was advanced to the level of upper pole and a new 6 x 26 contour type stent was carefully placed using cystoscopic  guidance.  Good proximal and distal plane were noted.  Next, the 21-French cystoscope was exchanged with the 24-French injection scope set, engaged in 0-degree lens.  Approximately 2 mL of indocyanine green dye was injected in the submucosal blebs x 4 at   the area of the diverticulum mouth for tissue  tattooing to allow better visualization during the partial cystectomy.  A new Foley catheter was placed free to straight drain, 10 mL water in the balloon.  The patient was repositioned into steep  Trendelenburg position and high flow, low pressure pneumoperitoneum was obtained using Veress technique in the supraumbilical midline, having passed the aspiration and drop test.  An 8 mm robotic camera port was then placed in the same location.   Laparoscopic examination of the peritoneal cavity did reveal some omental adhesions in the area of the midline and umbilicus.  With careful palpation, a hernia defect was palpated.  This was difficult to denote preoperatively given his large habitus.   Laparoscopic scissors were used to reduce the umbilical hernia of simple omental fat and additional ports were placed as follows:  Right paramedian 8 mm robotic port, right far lateral 12 mm AirSeal assist port, right paramedian 5 mm suction port, left  paramedian 8 mm robotic port, left far lateral 8 mm robotic port.  Robot was docked and passed the electronic checks.  Initial attention was directed at development of the left sided retroperitoneum.  Incision made lateral to the left median umbilical  ligament coursing along the iliac vessels towards the area of the left ureter.  Left vas deferens was encountered and ligated using medial bucket handle and the peritoneum was swept medially.  Left ureter was encountered as it coursed over the iliac  vessels.  It was clearly stented and this was marked with vessel loop and dissected distally circumferentially to the area of the ureterovesical junction as to identify this and avoid injury during diverticulum dissection.  Next, using indocyanine green  visualization and near infrared fluorescence light, the area of diverticulum mouth was visualized.  Using this guidance, the smooth walled, relatively thin but moderate sized  diverticulum was easily recognized. This was circumferentially mobilized and  dissected free of surrounding fatty tissue.  There was a small arterial pedicle towards the area of the diverticulum, which was controlled using Hem-o-lok clip and a predominantly extravesical approach was used to dissect the diverticulum down to very  near circumferentially at its connection with the bladder.  At this point, the diverticulum was purposely entered, so the entire mouth could be visualized.  Using a combination of internal and external cues, this was circumferentially dissected to the mouth and  then transected with the diverticulum and set aside for permanent pathology.  The mouth of diverticulum was clearly visualized and away from the stented ureteral orifice and ureter by at least 2.5 cm.  The diverticulum mouth was then reapproximated using  running 3-0 V-Loc, reapproximating the bladder mucosa and muscularis tissue in a running fashion.  This was then filled to 300 mL and found to be watertight.  A second imbricating layer of 2-0 V-Loc was also used, imbricating the first suture line, and  I was quite happy with the anatomic reapproximation of the diverticulum mouth. The diverticulum sac was placed into EndoCatch bag for later retrieval.  Sponge and needle counts were correct.  Hemostasis was excellent.  A closed suction drain was brought  through the previous left lateral most robotic port site into the area of the peritoneal cavity.  The vessel loop was removed.  Robot was undocked.  The previous right lateral most 12 mm assistant port site was closed at the level of the fascia using  Carter-Thomason suture passer.  Specimen was retrieved by extending the previous camera port site inferiorly towards the area of the umbilicus, purposely  entering the small umbilical hernia defect for total distance of approximately 6 cm and the  diverticulum specimen was removed, set aside for permanent pathology.  The edges of  the umbilical hernia sac and hernia area were circumferentially mobilized, such that the true fascia could be visualized circumferentially.  Small umbilical hernia sac  containing fat was also transected and removed and this area was reapproximated using figure-of-eight Novafil times approximately 8.  This resulted in excellent reapproximation of the fascia and closure of the umbilical hernia defect.  This was then  closed at the level of Scarpa's using running Vicryl.  All incision sites were infiltrated with dilute lipolyzed Marcaine and closed at the level of the skin using subcuticular Monocryl followed by Dermabond.  Procedure was then terminated.  The patient  tolerated the procedure well, no immediate periprocedural complications.  The patient was taken to postanesthesia care in stable condition.  Please note, first assistant, Harrie Foreman, was crucial for all portions of the surgery today.  She provided invaluable retraction, suctioning, specimen manipulation, vascular clipping and general first assistance.   SHW D: 05/13/2023 4:30:31 pm T: 05/13/2023 10:16:00 pm  JOB: [id number]/ [id number]

## 2023-05-15 LAB — SURGICAL PATHOLOGY

## 2023-05-18 ENCOUNTER — Telehealth: Payer: Self-pay | Admitting: *Deleted

## 2023-05-18 ENCOUNTER — Encounter: Payer: Self-pay | Admitting: *Deleted

## 2023-05-18 NOTE — Transitions of Care (Post Inpatient/ED Visit) (Signed)
05/18/2023  Name: Darren Black MRN: [id number] DOB: 1966-04-14  Today's TOC FU Call Status: Today's TOC FU Call Status:: Unsuccessful Call (1st Attempt) Unsuccessful Call (1st Attempt) Date: 05/18/23  Attempted to reach the patient regarding the most recent Inpatient visit; left HIPAA compliant voice message requesting call back  Follow Up Plan: Additional outreach attempts will be made to reach the patient to complete the Transitions of Care (Post Inpatient visit) call.   Caryl Pina, RN, BSN, CCRN Alumnus RN CM Care Coordination/ Transition of Care- [facility] Care Management [phone]: direct office

## 2023-05-19 ENCOUNTER — Telehealth: Payer: Self-pay | Admitting: *Deleted

## 2023-05-19 NOTE — Transitions of Care (Post Inpatient/ED Visit) (Signed)
05/19/2023  Name: Darren Black MRN: [id number] DOB: [birth year]  Today's TOC FU Call Status: Today's TOC FU Call Status:: Unsuccessful Call (2nd Attempt) Unsuccessful Call (2nd Attempt) Date: 05/19/23  Attempted to reach the patient regarding the most recent Inpatient visit; left HIPAA compliant voice message requesting call back  Follow Up Plan: Additional outreach attempts will be made to reach the patient to complete the Transitions of Care (Post Inpatient visit) call.   Caryl Pina, RN, BSN, CCRN Alumnus RN CM Care Coordination/ Transition of Care- [facility] Care Management [phone]: direct office

## 2023-05-20 ENCOUNTER — Telehealth: Payer: Self-pay | Admitting: *Deleted

## 2023-05-20 NOTE — Transitions of Care (Post Inpatient/ED Visit) (Signed)
05/20/2023  Name: Darren Black MRN: [id number] DOB: 10/26/1965  Today's TOC FU Call Status: Today's TOC FU Call Status:: Successful TOC FU Call Completed TOC FU Call Complete Date: 05/20/23 Patient's Name and Date of Birth confirmed.  Transition Care Management Follow-up Telephone Call Date of Discharge: 05/14/23 Discharge Facility: [facility]) Type of Discharge: Inpatient Admission Primary Inpatient Discharge Diagnosis:: bladder diverticulum/ laparoscopic partial cystectomy/ uretal stent placement/ umbilical hernia repair How have you been since you were released from the hospital?: Better (per spouse: "He is doing fine; we are not having any problems.  Going to see the surgeon on Monday; managing the foley catheter just fine, taking his medications as prescribed") Any questions or concerns?: No  Items Reviewed: Did you receive and understand the discharge instructions provided?: Yes (thoroughly reviewed with patient's spouse who verbalizes good understanding of same) Medications obtained,verified, and reconciled?: Yes (Medications Reviewed) (Full medication reconciliation/ review completed; no concerns or discrepancies identified; self-manages medications with prn assistance from spouse and denies questions/ concerns around medications today) Any new allergies since your discharge?: No Dietary orders reviewed?: Yes Type of Diet Ordered:: Regular Do you have support at home?: Yes People in Home: spouse Name of Support/Comfort Primary Source: Reports independent in self-care activities; supportive spouse assists as/ if needed/ indicated  Medications Reviewed Today: Medications Reviewed Today     Reviewed by Michaela Corner, RN (Registered Nurse) on 05/20/23 at 1011  Med List Status: <None>   Medication Order Taking? Sig Documenting Provider Last Dose Status Informant  amLODipine (NORVASC) 10 MG tablet [id number] Yes Take 1 tablet by mouth once daily Corwin Levins, MD Taking  Active Spouse/Significant Other  benazepril (LOTENSIN) 40 MG tablet [id number] Yes Take 1 tablet by mouth once daily Corwin Levins, MD Taking Active Spouse/Significant Other  Cetirizine HCl (KLS ALLER-TEC PO) [id number] Yes Take 10 mg by mouth daily. [provider] Taking Active Spouse/Significant Other           Med Note Otelia Limes May 20, 2023 10:00 AM) 05/20/23: reports during [facility] call has been held post- recent surgery; spouse reports to resume "next week"   docusate sodium (COLACE) 100 MG capsule [id number] Yes Take 100 mg by mouth 2 (two) times daily. [provider] Taking Active Spouse/Significant Other  doxazosin (CARDURA) 4 MG tablet [id number] Yes TAKE 1 TABLET BY MOUTH AT BEDTIME Corwin Levins, MD Taking Active Spouse/Significant Other  DULoxetine (CYMBALTA) 60 MG capsule [id number] Yes Take 1 capsule by mouth once daily in the morning Corwin Levins, MD Taking Active Spouse/Significant Other  ezetimibe (ZETIA) 10 MG tablet [id number] Yes Take 1 tablet by mouth once daily Corwin Levins, MD Taking Active Spouse/Significant Other  fluticasone (FLONASE) 50 MCG/ACT nasal spray [id number] Yes Use 2 spray(s) in each nostril once daily Corwin Levins, MD Taking Active Spouse/Significant Other           Med Note Otelia Limes May 20, 2023  9:58 AM) 05/20/23: reports during [facility] call has not needed recently  hydrochlorothiazide (HYDRODIURIL) 25 MG tablet [id number] Yes Take 1/2 (one-half) tablet by mouth once daily Corwin Levins, MD Taking Active Spouse/Significant Other  LORazepam (ATIVAN) 1 MG tablet [id number] Yes Take 1 tablet by mouth twice daily as needed for anxiety  Patient taking differently: Take 1 mg by mouth daily. for anxiety   Corwin Levins, MD Taking Active Spouse/Significant Other  oxyCODONE-acetaminophen (PERCOCET/ROXICET) 5-325 MG tablet [id number] Yes Take  1 tablet by mouth every 6 (six) hours as needed for severe pain. G89.4 Chronic pain-  Patient  taking differently: Take 1 tablet by mouth daily. Additional tablet of needed during the day G89.4 Chronic pain-   Jones Bales, NP Taking Active Spouse/Significant Other  potassium chloride (KLOR-CON) 10 MEQ tablet [id number] Yes Take 2 tablets (20 mEq total) by mouth daily.  Patient taking differently: Take 10 mEq by mouth 2 (two) times daily.   Corwin Levins, MD Taking Active Spouse/Significant Other  pravastatin (PRAVACHOL) 80 MG tablet [id number] Yes Take 1 tablet (80 mg total) by mouth daily. Corwin Levins, MD Taking Active Spouse/Significant Other  Protein POWD [id number] No Take 1 Scoop by mouth daily as needed (instead of meal). Also use protein drink or bar  Patient not taking: Reported on 05/20/2023   [provider] Not Taking Active Spouse/Significant Other  tamsulosin (FLOMAX) 0.4 MG CAPS capsule [id number] Yes Take 1 capsule by mouth once daily Corwin Levins, MD Taking Active Spouse/Significant Other  testosterone cypionate (DEPOTESTOSTERONE CYPIONATE) 200 MG/ML injection [id number] Yes Inject 200 mg into the muscle once a week. [provider] Taking Active Spouse/Significant Other           Med Note Otelia Limes May 20, 2023 10:01 AM) 05/20/23: reports during [facility] call has not been taking after recent surgery- spouse reports he is to "resume taking next week"   tirzepatide (ZEPBOUND) 2.5 MG/0.5ML Pen [id number] No Inject 2.5 mg into the skin once a week. R73.03 prediabetes  Patient not taking: Reported on 05/20/2023   Corwin Levins, MD Not Taking Active Spouse/Significant Other           Med Note Otelia Limes May 20, 2023 10:02 AM) 05/20/23: reports during [facility] call has not been taking- reports has not been taking due to insurance "not approving"           Home Care and Equipment/Supplies: Were Home Health Services Ordered?: No Any new equipment or medical supplies ordered?: No  Functional Questionnaire: Do you need assistance with  bathing/showering or dressing?: Yes (wife assisting as indicated; supervising all self-care activities) Do you need assistance with meal preparation?: Yes (wife assisting as indicated; supervising all self-care activities) Do you need assistance with eating?: No Do you have difficulty maintaining continence: No Do you need assistance with getting out of bed/getting out of a chair/moving?: Yes (wife assisting as indicated; supervising all self-care activities) Do you have difficulty managing or taking your medications?: Yes (wife assists as indicated)  Follow up appointments reviewed: PCP Follow-up appointment confirmed?: NA (verified not indicated per hospital discharging provider discharge notes) Specialist Hospital Follow-up appointment confirmed?: Yes Date of Specialist follow-up appointment?: 05/25/23 Follow-Up Specialty Provider:: [facility] Do you need transportation to your follow-up appointment?: No Do you understand care options if your condition(s) worsen?: Yes-patient verbalized understanding  SDOH Interventions Today    Flowsheet Row Most Recent Value  SDOH Interventions   Food Insecurity Interventions Intervention Not Indicated  Transportation Interventions Intervention Not Indicated  [spouse reports that atbaseline patient "is able to" drive,  however she reports she "has always done most of the driving"- she continues to provide transportation]      TOC Interventions Today    Flowsheet Row Most Recent Value  TOC Interventions   TOC Interventions Discussed/Reviewed TOC Interventions Discussed  [Spouse declines need for ongoing/ further care coordination outreach,  offered to provide my direct contact information should  questions/ concerns/ needs arise post-TOC call-- spouse declined taking my number]      Interventions Today    Flowsheet Row Most Recent Value  Chronic Disease   Chronic disease during today's visit Other  [surgical uretal stent placement with  umbilical hernia repair]  General Interventions   General Interventions Discussed/Reviewed General Interventions Discussed, Durable Medical Equipment (DME), Doctor Visits  Doctor Visits Discussed/Reviewed Doctor Visits Discussed, Specialist, PCP  [verified no PCP HFU office visit recommended post-recent hospital discharge]  Durable Medical Equipment (DME) Other  [confirmed not currently requiring/ using assistive devices]  PCP/Specialist Visits Compliance with follow-up visit  Education Interventions   Education Provided Provided Education  Provided Verbal Education On Other, Medication  [reinforced general care/ maintenance of foley catheter at home,  difference between stool softener and laxatives]  Nutrition Interventions   Nutrition Discussed/Reviewed Nutrition Discussed  Pharmacy Interventions   Pharmacy Dicussed/Reviewed Pharmacy Topics Discussed  [Full medication review with updating medication list in EHR per spouse report]  Safety Interventions   Safety Discussed/Reviewed Safety Discussed, Fall Risk  [fall risk due to foley catheter post-recent surgery]      Caryl Pina, RN, BSN, CCRN Alumnus RN CM Care Coordination/ Transition of Care- [facility] Care Management [phone]: direct office

## 2023-05-25 DIAGNOSIS — R3914 Feeling of incomplete bladder emptying: Secondary | ICD-10-CM | POA: Diagnosis not present

## 2023-05-25 DIAGNOSIS — N323 Diverticulum of bladder: Secondary | ICD-10-CM | POA: Diagnosis not present

## 2023-05-30 ENCOUNTER — Other Ambulatory Visit: Payer: Self-pay | Admitting: Internal Medicine

## 2023-06-01 ENCOUNTER — Other Ambulatory Visit: Payer: Self-pay

## 2023-06-01 ENCOUNTER — Telehealth: Payer: Self-pay | Admitting: Specialist

## 2023-06-01 DIAGNOSIS — N323 Diverticulum of bladder: Secondary | ICD-10-CM | POA: Diagnosis not present

## 2023-06-01 NOTE — Telephone Encounter (Signed)
Patient is a patient of Dr. Berline Chough.  Patient needs refill on oxycodone.  Pharmacy is [facility] [address].

## 2023-06-02 MED ORDER — OXYCODONE-ACETAMINOPHEN 5-325 MG PO TABS: 1.0000 | ORAL_TABLET | Freq: Four times a day (QID) | ORAL | 0 refills | Status: DC | PRN

## 2023-06-09 ENCOUNTER — Telehealth: Payer: Self-pay

## 2023-06-09 DIAGNOSIS — E538 Deficiency of other specified B group vitamins: Secondary | ICD-10-CM

## 2023-06-09 DIAGNOSIS — E7800 Pure hypercholesterolemia, unspecified: Secondary | ICD-10-CM

## 2023-06-09 DIAGNOSIS — E559 Vitamin D deficiency, unspecified: Secondary | ICD-10-CM

## 2023-06-09 DIAGNOSIS — R972 Elevated prostate specific antigen [PSA]: Secondary | ICD-10-CM

## 2023-06-09 DIAGNOSIS — R739 Hyperglycemia, unspecified: Secondary | ICD-10-CM

## 2023-06-09 NOTE — Telephone Encounter (Signed)
Patient is scheduled for appt on 07/09/2023 and would like a lab order to be placed to have bloodwork completed before next appt. Please advise.

## 2023-06-09 NOTE — Telephone Encounter (Signed)
Ok labs are ordered 

## 2023-06-09 NOTE — Telephone Encounter (Signed)
Wife advised that lab order has been placed.

## 2023-06-18 ENCOUNTER — Other Ambulatory Visit: Payer: Medicare HMO

## 2023-06-18 DIAGNOSIS — E559 Vitamin D deficiency, unspecified: Secondary | ICD-10-CM

## 2023-06-18 DIAGNOSIS — R739 Hyperglycemia, unspecified: Secondary | ICD-10-CM

## 2023-06-18 DIAGNOSIS — R972 Elevated prostate specific antigen [PSA]: Secondary | ICD-10-CM

## 2023-06-18 DIAGNOSIS — E7800 Pure hypercholesterolemia, unspecified: Secondary | ICD-10-CM | POA: Diagnosis not present

## 2023-06-18 DIAGNOSIS — E538 Deficiency of other specified B group vitamins: Secondary | ICD-10-CM

## 2023-06-18 LAB — URINALYSIS, ROUTINE W REFLEX MICROSCOPIC
Bilirubin Urine: NEGATIVE
Hgb urine dipstick: NEGATIVE
Ketones, ur: NEGATIVE
Leukocytes,Ua: NEGATIVE
Nitrite: NEGATIVE
Specific Gravity, Urine: 1.01 (ref 1.000–1.030)
Total Protein, Urine: NEGATIVE
Urine Glucose: NEGATIVE
Urobilinogen, UA: 0.2 (ref 0.0–1.0)
pH: 6.5 (ref 5.0–8.0)

## 2023-06-18 LAB — MICROALBUMIN / CREATININE URINE RATIO
Creatinine,U: 79.8 mg/dL
Microalb Creat Ratio: 3.3 mg/g (ref 0.0–30.0)
Microalb, Ur: 2.6 mg/dL — ABNORMAL HIGH (ref 0.0–1.9)

## 2023-06-18 LAB — BASIC METABOLIC PANEL
BUN: 15 mg/dL (ref 6–23)
CO2: 32 meq/L (ref 19–32)
Calcium: 9.4 mg/dL (ref 8.4–10.5)
Chloride: 97 meq/L (ref 96–112)
Creatinine, Ser: 1.45 mg/dL (ref 0.40–1.50)
GFR: 53.95 mL/min — ABNORMAL LOW (ref >60.00)
Glucose, Bld: 93 mg/dL (ref 70–99)
Potassium: 3.9 meq/L (ref 3.5–5.1)
Sodium: 137 meq/L (ref 135–145)

## 2023-06-18 LAB — TSH: TSH: 1.28 u[IU]/mL (ref 0.35–5.50)

## 2023-06-18 LAB — LIPID PANEL
Cholesterol: 135 mg/dL (ref 0–200)
HDL: 47.7 mg/dL (ref >39.00)
LDL Cholesterol: 71 mg/dL (ref 0–99)
NonHDL: 87.3
Total CHOL/HDL Ratio: 3
Triglycerides: 83 mg/dL (ref 0.0–149.0)
VLDL: 16.6 mg/dL (ref 0.0–40.0)

## 2023-06-18 LAB — CBC WITH DIFFERENTIAL/PLATELET
Basophils Absolute: 0 10*3/uL (ref 0.0–0.1)
Basophils Relative: 0.8 % (ref 0.0–3.0)
Eosinophils Absolute: 0.4 10*3/uL (ref 0.0–0.7)
Eosinophils Relative: 5.6 % — ABNORMAL HIGH (ref 0.0–5.0)
HCT: 48.3 % (ref 39.0–52.0)
Hemoglobin: 15.7 g/dL (ref 13.0–17.0)
Lymphocytes Relative: 20.5 % (ref 12.0–46.0)
Lymphs Abs: 1.3 10*3/uL (ref 0.7–4.0)
MCHC: 32.4 g/dL (ref 30.0–36.0)
MCV: 82 fL (ref 78.0–100.0)
Monocytes Absolute: 0.5 10*3/uL (ref 0.1–1.0)
Monocytes Relative: 7.7 % (ref 3.0–12.0)
Neutro Abs: 4.2 10*3/uL (ref 1.4–7.7)
Neutrophils Relative %: 65.4 % (ref 43.0–77.0)
Platelets: 179 10*3/uL (ref 150.0–400.0)
RBC: 5.89 Mil/uL — ABNORMAL HIGH (ref 4.22–5.81)
RDW: 14 % (ref 11.5–15.5)
WBC: 6.5 10*3/uL (ref 4.0–10.5)

## 2023-06-18 LAB — HEMOGLOBIN A1C: Hgb A1c MFr Bld: 6.1 % (ref 4.6–6.5)

## 2023-06-18 LAB — HEPATIC FUNCTION PANEL
ALT: 31 U/L (ref 0–53)
AST: 23 U/L (ref 0–37)
Albumin: 4.2 g/dL (ref 3.5–5.2)
Alkaline Phosphatase: 58 U/L (ref 39–117)
Bilirubin, Direct: 0.2 mg/dL (ref 0.0–0.3)
Total Bilirubin: 0.8 mg/dL (ref 0.2–1.2)
Total Protein: 6.8 g/dL (ref 6.0–8.3)

## 2023-06-18 LAB — VITAMIN D 25 HYDROXY (VIT D DEFICIENCY, FRACTURES): VITD: 51.96 ng/mL (ref 30.00–100.00)

## 2023-06-18 LAB — VITAMIN B12: Vitamin B-12: 659 pg/mL (ref 211–911)

## 2023-06-18 LAB — PSA: PSA: 1.86 ng/mL (ref 0.10–4.00)

## 2023-06-22 ENCOUNTER — Encounter: Payer: Medicare HMO | Attending: Registered Nurse | Admitting: Physical Medicine and Rehabilitation

## 2023-06-22 ENCOUNTER — Encounter: Payer: Self-pay | Admitting: Physical Medicine and Rehabilitation

## 2023-06-22 VITALS — BP 122/80 | HR 75 | Ht 67.0 in | Wt 267.8 lb

## 2023-06-22 DIAGNOSIS — G8929 Other chronic pain: Secondary | ICD-10-CM | POA: Insufficient documentation

## 2023-06-22 DIAGNOSIS — M549 Dorsalgia, unspecified: Secondary | ICD-10-CM | POA: Diagnosis not present

## 2023-06-22 DIAGNOSIS — M5416 Radiculopathy, lumbar region: Secondary | ICD-10-CM | POA: Insufficient documentation

## 2023-06-22 DIAGNOSIS — G894 Chronic pain syndrome: Secondary | ICD-10-CM | POA: Insufficient documentation

## 2023-06-22 MED ORDER — OXYCODONE-ACETAMINOPHEN 5-325 MG PO TABS: 1.0000 | ORAL_TABLET | Freq: Four times a day (QID) | ORAL | 0 refills | Status: DC | PRN

## 2023-06-22 MED ORDER — PREDNISONE 20 MG PO TABS: 20.0000 mg | ORAL_TABLET | Freq: Every day | ORAL | 0 refills | Status: DC

## 2023-06-22 MED ORDER — DIAZEPAM 5 MG PO TABS
5.0000 mg | ORAL_TABLET | Freq: Two times a day (BID) | ORAL | 0 refills | Status: DC
Start: 2023-06-22 — End: 2023-09-24

## 2023-06-22 MED ORDER — OXYCODONE-ACETAMINOPHEN 5-325 MG PO TABS: 1.0000 | ORAL_TABLET | Freq: Three times a day (TID) | ORAL | 0 refills | Status: DC | PRN

## 2023-06-22 NOTE — Patient Instructions (Signed)
Pt is a 58 yr old male with hx of R shoulder pain due to complete supraspinatus tear HTN; no DM; HLD, BPH, on Flomax and Prazosin; and allergies and low testosterone, depression/anxiety on Lorazepam/Cymbalta; ;  Here for f/u of chronic low back pain due to lumbar radiculopathy.    If can hold off on Lorazepam- for 5 days, will try Valium 5 mg 2x/day -  5 mg Valium 2x/day  for 5 days.   2. Will also do Prednisone- 20 mg daily for 4 days- to help calm down inflammation for back strain.    3. Percocet 10/325 mg - refill due- will refill 2 different times- to get filled 07/30/23   4. Will have f/u with Riley Lam in 2 months and me in 6 months-    5.  Call me in 1 week to let me know if new treatment worked well- my chart is fine.

## 2023-06-22 NOTE — Progress Notes (Signed)
Subjective:    Patient ID: Darren Black, male    DOB: 1965-11-25, 57 y.o.   MRN: [id number]  HPI Pt is a 57 yr old male with hx of R shoulder pain due to complete supraspinatus tear HTN; no DM; HLD, BPH, on Flomax and Prazosin; and allergies and low testosterone, depression/anxiety on Lorazepam/Cymbalta; ;  Here for f/u of chronic low back pain due to lumbar radiculopathy.   Had surgery in bladder- took that "bubble'- that expanded ot size of coke can out of bladder-  Should stop UTI's since wasn't able to empty his bladder fully before.   Surgery 05/13/23-  Still having a lot of pain from that.  Stitches are dissolvable.   Did something to back, as well.  Sat in car too long on day went to vote.  Also took mother- stiffened him up-   Feels like a knot in back- in R low back- size of fist.    Have tried muscle relaxers in past-   On Lorazepam for Nerves- have panic attacks- 2-3x/week.  Never tried Valium/Or Diazepam.  Cannot barely walk right now- so painful.    Pain Inventory Average Pain 10 Pain Right Now 10 My pain is constant, sharp, dull, stabbing, and aching  In the last 24 hours, has pain interfered with the following? General activity 9 Relation with others 8 Enjoyment of life 9 What TIME of day is your pain at its worst? morning , daytime, evening, and night Sleep (in general) Poor  Pain is worse with: walking, bending, sitting, inactivity, standing, and some activites Pain improves with: medication Relief from Meds:  not specified  Family History  Problem Relation Age of Onset   Coronary artery disease Father    Cancer Father        anaplastic thyroid cancer   Stroke Other        1st degree relative   Diabetes Other        1st degree relative   Hypertension Other    Hyperlipidemia Other    Social History   Socioeconomic History   Marital status: Married    Spouse name: Not on file   Number of children: Not on file   Years of education: Not  on file   Highest education level: Not on file  Occupational History   Occupation: disabled, LBP and anxiety  Tobacco Use   Smoking status: Never   Smokeless tobacco: Never  Vaping Use   Vaping status: Never Used  Substance and Sexual Activity   Alcohol use: Yes    Comment: rare   Drug use: No   Sexual activity: Not on file  Other Topics Concern   Not on file  Social History Narrative   Not on file   Social Determinants of Health   Financial Resource Strain: Low Risk  (01/22/2022)   Overall Financial Resource Strain (CARDIA)    Difficulty of Paying Living Expenses: Not hard at all  Food Insecurity: No Food Insecurity (05/20/2023)   Hunger Vital Sign    Worried About Running Out of Food in the Last Year: Never true    Ran Out of Food in the Last Year: Never true  Transportation Needs: No Transportation Needs (05/20/2023)   PRAPARE - Administrator, Civil Service (Medical): No    Lack of Transportation (Non-Medical): No  Physical Activity: Inactive (01/22/2022)   Exercise Vital Sign    Days of Exercise per Week: 0 days    Minutes of Exercise  per Session: 0 min  Stress: No Stress Concern Present (01/22/2022)   [employer] of Occupational Health - Occupational Stress Questionnaire    Feeling of Stress : Not at all  Social Connections: Moderately Isolated (01/22/2022)   Social Connection and Isolation Panel [NHANES]    Frequency of Communication with Friends and Family: More than three times a week    Frequency of Social Gatherings with Friends and Family: Once a week    Attends Religious Services: Never    Database administrator or Organizations: No    Attends Engineer, structural: Never    Marital Status: Married   Past Surgical History:  Procedure Laterality Date   APPENDECTOMY  2003   BREATH Nelda Bucks  04/27/2012   Procedure: BREATH TEK H PYLORI;  Surgeon: Valarie Merino, MD;  Location: [facility];  Service: General;  Laterality: N/A;    CHOLECYSTECTOMY N/A 03/24/2019   Procedure: LAPAROSCOPIC CHOLECYSTECTOMY WITH INTRAOPERATIVE CHOLANGIOGRAM;  Surgeon: Manus Rudd, MD;  Location: [facility];  Service: General;  Laterality: N/A;   CYSTOSCOPY W/ URETERAL STENT PLACEMENT Left 05/13/2023   Procedure: CYSTOSCOPY WITH RETROGRADE PYELOGRAM/URETERAL STENT PLACEMENT AND INDOCYANINE GREEN DYE INJECTION;  Surgeon: Loletta Parish., MD;  Location: [facility];  Service: Urology;  Laterality: Left;   CYSTOSCOPY WITH URETHRAL DILATATION N/A 04/12/2015   Procedure: CYSTOSCOPY WITH URETHRAL DILATATION, RETROGRADE AND URETHROGRAM WITH BLADDER BIOPSY;  Surgeon: Bjorn Pippin, MD;  Location: [facility];  Service: Urology;  Laterality: N/A;   ROBOTIC ASSISTED LAPAROSCOPIC BLADDER DIVERTICULECTOMY N/A 05/13/2023   Procedure: XI ROBOTIC ASSISTED LAPAROSCOPIC PARTIAL CYSTECTOMY, OPNE UMBILICAL HERNIA REPAIR;  Surgeon: Loletta Parish., MD;  Location: [facility];  Service: Urology;  Laterality: N/A;   ROTATOR CUFF REPAIR Right 01-21-2012   TRANSTHORACIC ECHOCARDIOGRAM  03-26-2007   normal LVSF, ef 60%,  mild AV calcification without stenosis,  mild MR and TR,  mild LAE   URETHROGRAM N/A 04/12/2015   Procedure: URETHROGRAM;  Surgeon: Bjorn Pippin, MD;  Location[facility];  Service: Urology;  Laterality: N/A;   Past Surgical History:  Procedure Laterality Date   APPENDECTOMY  2003   BREATH TEK H PYLORI  04/27/2012   Procedure: BREATH TEK H PYLORI;  Surgeon: Valarie Merino, MD;  Location: [facility];  Service: General;  Laterality: N/A;   CHOLECYSTECTOMY N/A 03/24/2019   Procedure: LAPAROSCOPIC CHOLECYSTECTOMY WITH INTRAOPERATIVE CHOLANGIOGRAM;  Surgeon: Manus Rudd, MD;  Location[facility];  Service: General;  Laterality: N/A;   CYSTOSCOPY W/ URETERAL STENT PLACEMENT Left 05/13/2023   Procedure: CYSTOSCOPY WITH RETROGRADE PYELOGRAM/URETERAL STENT PLACEMENT AND INDOCYANINE GREEN DYE INJECTION;  Surgeon: Loletta Parish., MD;   Location: [facility];  Service: Urology;  Laterality: Left;   CYSTOSCOPY WITH URETHRAL DILATATION N/A 04/12/2015   Procedure: CYSTOSCOPY WITH URETHRAL DILATATION, RETROGRADE AND URETHROGRAM WITH BLADDER BIOPSY;  Surgeon: Bjorn Pippin, MD;  Location: [facility];  Service: Urology;  Laterality: N/A;   ROBOTIC ASSISTED LAPAROSCOPIC BLADDER DIVERTICULECTOMY N/A 05/13/2023   Procedure: XI ROBOTIC ASSISTED LAPAROSCOPIC PARTIAL CYSTECTOMY, OPNE UMBILICAL HERNIA REPAIR;  Surgeon: Loletta Parish., MD;  Location: [facility];  Service: Urology;  Laterality: N/A;   ROTATOR CUFF REPAIR Right 01-21-2012   TRANSTHORACIC ECHOCARDIOGRAM  03-26-2007   normal LVSF, ef 60%,  mild AV calcification without stenosis,  mild MR and TR,  mild LAE   URETHROGRAM N/A 04/12/2015   Procedure: URETHROGRAM;  Surgeon: Bjorn Pippin, MD;  Location: [facility];  Service: Urology;  Laterality: N/A;   Past Medical History:  Diagnosis Date   Anxiety    BPH (benign prostatic hypertrophy)    Chronic lower back pain    nerve damage w/ leg pain   Depression    Diarrhea 03/23/2019   Family history of adverse reaction to anesthesia    MOTHER--- HARD TO WAKE   GERD (gastroesophageal reflux disease)    History of panic attacks    Hyperlipidemia    Hypertension    Meatal stenosis    OSA on CPAP    moderate to severe OSA  per study 04-28-2007 uses CPAP   PONV (postoperative nausea and vomiting)    Hard to wake up   BP 122/80   Pulse 75   Ht 5\' 7"  (1.702 m)   Wt 267 lb 12.8 oz (121.5 kg)   SpO2 97%   BMI 41.94 kg/m   Opioid Risk Score:   Fall Risk Score:  `1  Depression screen PHQ 2/9     06/22/2023    1:04 PM 01/12/2023    3:30 PM 09/22/2022    1:55 PM 07/14/2022    3:01 PM 06/20/2022   11:34 AM 02/21/2022   11:09 AM 01/22/2022    1:30 PM  Depression screen PHQ 2/9  Decreased Interest 1 0 0 0 3 1 1   Down, Depressed, Hopeless 1 0 0 0 3 1 1   PHQ - 2 Score 2 0 0 0 6 2 2   Altered sleeping    0    0  Tired, decreased energy    0   3  Change in appetite    0   0  Feeling bad or failure about yourself     0   0  Trouble concentrating    0   2  Moving slowly or fidgety/restless    0   0  Suicidal thoughts    0   0  PHQ-9 Score    0   7  Difficult doing work/chores    Not difficult at all   Not difficult at all     Review of Systems  Constitutional: Negative.   HENT: Negative.    Eyes: Negative.   Respiratory: Negative.    Cardiovascular: Negative.   Gastrointestinal: Negative.   Endocrine: Negative.   Genitourinary: Negative.   Musculoskeletal:  Positive for back pain and neck pain.       Bil knees, right hip   Skin: Negative.   Allergic/Immunologic: Negative.   Neurological: Negative.   Hematological: Negative.   Psychiatric/Behavioral:  Positive for dysphoric mood.   All other systems reviewed and are negative.      Objective:   Physical Exam  Awake, alert, appropriate, appear sin pain; very still NAD Accompanied by wife Large Trigger point in R low paraspinals on lumbar aspect.  About side of fist     Assessment & Plan:   Pt is a 57 yr old male with hx of R shoulder pain due to complete supraspinatus tear HTN; no DM; HLD, BPH, on Flomax and Prazosin; and allergies and low testosterone, depression/anxiety on Lorazepam/Cymbalta; ;  Here for f/u of chronic low back pain due to lumbar radiculopathy.    If can hold off on Lorazepam- for 5 days, will try Valium 5 mg 2x/day -  5 mg Valium 2x/day  for 5 days.   2. Will also do Prednisone- 20 mg daily for 4 days- to help calm down inflammation for  back strain.    3. Percocet 10/325 mg - refill due- will refill 2 different times- to get filled 07/30/23   4. Will have f/u with Riley Lam in 2 months and me in 6 months-    5.  Call me in 1 week to let me know if new treatment worked well- my chart is fine.    I spent a total of 21   minutes on total care today- >50% coordination of care- due to discussion about  new on chronic pain- will do trP injections if doesn't work.

## 2023-06-24 ENCOUNTER — Other Ambulatory Visit: Payer: Self-pay | Admitting: Internal Medicine

## 2023-07-02 DIAGNOSIS — E291 Testicular hypofunction: Secondary | ICD-10-CM | POA: Diagnosis not present

## 2023-07-02 DIAGNOSIS — E8881 Metabolic syndrome: Secondary | ICD-10-CM | POA: Diagnosis not present

## 2023-07-02 DIAGNOSIS — Z79899 Other long term (current) drug therapy: Secondary | ICD-10-CM | POA: Diagnosis not present

## 2023-07-02 DIAGNOSIS — R972 Elevated prostate specific antigen [PSA]: Secondary | ICD-10-CM | POA: Diagnosis not present

## 2023-07-02 DIAGNOSIS — E079 Disorder of thyroid, unspecified: Secondary | ICD-10-CM | POA: Diagnosis not present

## 2023-07-02 DIAGNOSIS — E279 Disorder of adrenal gland, unspecified: Secondary | ICD-10-CM | POA: Diagnosis not present

## 2023-07-09 ENCOUNTER — Encounter: Payer: Self-pay | Admitting: Internal Medicine

## 2023-07-09 ENCOUNTER — Ambulatory Visit (INDEPENDENT_AMBULATORY_CARE_PROVIDER_SITE_OTHER): Payer: Medicare HMO | Admitting: Internal Medicine

## 2023-07-09 VITALS — BP 128/80 | HR 41 | Temp 98.1°F | Ht 67.0 in | Wt 274.0 lb

## 2023-07-09 DIAGNOSIS — R001 Bradycardia, unspecified: Secondary | ICD-10-CM

## 2023-07-09 DIAGNOSIS — I493 Ventricular premature depolarization: Secondary | ICD-10-CM

## 2023-07-09 DIAGNOSIS — E7800 Pure hypercholesterolemia, unspecified: Secondary | ICD-10-CM | POA: Diagnosis not present

## 2023-07-09 DIAGNOSIS — R972 Elevated prostate specific antigen [PSA]: Secondary | ICD-10-CM | POA: Diagnosis not present

## 2023-07-09 DIAGNOSIS — R739 Hyperglycemia, unspecified: Secondary | ICD-10-CM

## 2023-07-09 DIAGNOSIS — Z23 Encounter for immunization: Secondary | ICD-10-CM

## 2023-07-09 DIAGNOSIS — J309 Allergic rhinitis, unspecified: Secondary | ICD-10-CM

## 2023-07-09 DIAGNOSIS — E559 Vitamin D deficiency, unspecified: Secondary | ICD-10-CM

## 2023-07-09 DIAGNOSIS — N1831 Chronic kidney disease, stage 3a: Secondary | ICD-10-CM

## 2023-07-09 DIAGNOSIS — R3914 Feeling of incomplete bladder emptying: Secondary | ICD-10-CM | POA: Diagnosis not present

## 2023-07-09 DIAGNOSIS — E538 Deficiency of other specified B group vitamins: Secondary | ICD-10-CM | POA: Diagnosis not present

## 2023-07-09 DIAGNOSIS — I10 Essential (primary) hypertension: Secondary | ICD-10-CM | POA: Diagnosis not present

## 2023-07-09 NOTE — Progress Notes (Signed)
Patient ID: Darren Black, male   DOB: 10/24/1965, 57 y.o.   MRN: [id number]        Chief Complaint: follow up low HR on intake with frequent PVCs asymptomatic, hld, hyperglycemia, htn, ckd3a       HPI:  Darren Black is a 57 y.o. male here overall doing ok. Pt denies chest pain, increased sob or doe, wheezing, orthopnea, PND, increased LE swelling, palpitations, dizziness or syncope.  Initial HR on intake today is 41, ECG c/w SR 60 with freq bigeminy, asymptomatic   Pt denies polydipsia, polyuria, or new focal neuro s/s.    Pt denies fever, wt loss, night sweats, loss of appetite, or other constitutional symptoms   Due for flu shot.  Does have several wks ongoing nasal allergy symptoms with clearish congestion, itch and sneezing, without fever, pain, ST, cough, swelling or wheezing.  Wt Readings from Last 3 Encounters:  07/09/23 274 lb (124.3 kg)  06/22/23 267 lb 12.8 oz (121.5 kg)  05/13/23 268 lb 3.2 oz (121.7 kg)   BP Readings from Last 3 Encounters:  07/09/23 128/80  06/22/23 122/80  05/14/23 139/84         Past Medical History:  Diagnosis Date   Anxiety    BPH (benign prostatic hypertrophy)    Chronic lower back pain    nerve damage w/ leg pain   Depression    Diarrhea 03/23/2019   Family history of adverse reaction to anesthesia    MOTHER--- HARD TO WAKE   GERD (gastroesophageal reflux disease)    History of panic attacks    Hyperlipidemia    Hypertension    Meatal stenosis    OSA on CPAP    moderate to severe OSA  per study 04-28-2007 uses CPAP   PONV (postoperative nausea and vomiting)    Hard to wake up   Past Surgical History:  Procedure Laterality Date   APPENDECTOMY  2003   BREATH TEK H PYLORI  04/27/2012   Procedure: BREATH TEK H PYLORI;  Surgeon: Valarie Merino, MD;  Location: [facility];  Service: General;  Laterality: N/A;   CHOLECYSTECTOMY N/A 03/24/2019   Procedure: LAPAROSCOPIC CHOLECYSTECTOMY WITH INTRAOPERATIVE CHOLANGIOGRAM;  Surgeon: Manus Rudd, MD;  [facility]: [facility];  Service: General;  Laterality: N/A;   CYSTOSCOPY W/ URETERAL STENT PLACEMENT Left 05/13/2023   Procedure: CYSTOSCOPY WITH RETROGRADE PYELOGRAM/URETERAL STENT PLACEMENT AND INDOCYANINE GREEN DYE INJECTION;  Surgeon: Loletta Parish., MD;  Location: [facility];  Service: Urology;  Laterality: Left;   CYSTOSCOPY WITH URETHRAL DILATATION N/A 04/12/2015   Procedure: CYSTOSCOPY WITH URETHRAL DILATATION, RETROGRADE AND URETHROGRAM WITH BLADDER BIOPSY;  Surgeon: Bjorn Pippin, MD;  Location: [facility];  Service: Urology;  Laterality: N/A;   ROBOTIC ASSISTED LAPAROSCOPIC BLADDER DIVERTICULECTOMY N/A 05/13/2023   Procedure: XI ROBOTIC ASSISTED LAPAROSCOPIC PARTIAL CYSTECTOMY, OPNE UMBILICAL HERNIA REPAIR;  Surgeon: Loletta Parish., MD;  Location: [facility];  Service: Urology;  Laterality: N/A;   ROTATOR CUFF REPAIR Right 01-21-2012   TRANSTHORACIC ECHOCARDIOGRAM  03-26-2007   normal LVSF, ef 60%,  mild AV calcification without stenosis,  mild MR and TR,  mild LAE   URETHROGRAM N/A 04/12/2015   Procedure: URETHROGRAM;  Surgeon: Bjorn Pippin, MD;  Location[facility];  Service: Urology;  Laterality: N/A;    reports that he has never smoked. He has never used smokeless tobacco. He reports current alcohol use. He reports that he does not use drugs. family history includes Cancer in his  father; Coronary artery disease in his father; Diabetes in an other family member; Hyperlipidemia in an other family member; Hypertension in an other family member; Stroke in an other family member. Allergies  Allergen Reactions   Gabapentin Swelling   Crestor [Rosuvastatin Calcium] Other (See Comments)    abd pain   Current Outpatient Medications on File Prior to Visit  Medication Sig Dispense Refill   amLODipine (NORVASC) 10 MG tablet Take 1 tablet by mouth once daily 90 tablet 3   benazepril (LOTENSIN) 40 MG tablet Take 1 tablet by mouth once daily 90 tablet 2    Cetirizine HCl (KLS ALLER-TEC PO) Take 10 mg by mouth daily.     diazepam (VALIUM) 5 MG tablet Take 1 tablet (5 mg total) by mouth every 12 (twelve) hours. Don't take WITH lorazepam- for 5 days, just valium 10 tablet 0   docusate sodium (COLACE) 100 MG capsule Take 100 mg by mouth 2 (two) times daily.     doxazosin (CARDURA) 4 MG tablet TAKE 1 TABLET BY MOUTH AT BEDTIME 90 tablet 03   DULoxetine (CYMBALTA) 60 MG capsule Take 1 capsule by mouth once daily in the morning 90 capsule 3   ezetimibe (ZETIA) 10 MG tablet Take 1 tablet by mouth once daily 90 tablet 1   fluticasone (FLONASE) 50 MCG/ACT nasal spray Use 2 spray(s) in each nostril once daily 16 g 0   hydrochlorothiazide (HYDRODIURIL) 25 MG tablet Take 1/2 (one-half) tablet by mouth once daily 45 tablet 1   LORazepam (ATIVAN) 1 MG tablet Take 1 tablet by mouth twice daily as needed for anxiety 60 tablet 0   oxyCODONE-acetaminophen (PERCOCET) 5-325 MG tablet Take 1 tablet by mouth every 8 (eight) hours as needed for severe pain (pain score 7-10). For- chronic pain- don't fill til 07/30/23 70 tablet 0   oxyCODONE-acetaminophen (PERCOCET/ROXICET) 5-325 MG tablet Take 1 tablet by mouth every 6 (six) hours as needed for severe pain (pain score 7-10). G89.4 Chronic pain- 70 tablet 0   potassium chloride (KLOR-CON) 10 MEQ tablet Take 2 tablets (20 mEq total) by mouth daily. (Patient taking differently: Take 10 mEq by mouth 2 (two) times daily.) 180 tablet 3   pravastatin (PRAVACHOL) 80 MG tablet Take 1 tablet (80 mg total) by mouth daily. 90 tablet 3   predniSONE (DELTASONE) 20 MG tablet Take 1 tablet (20 mg total) by mouth daily with breakfast. For acute on chronic back pain 4 tablet 0   Protein POWD Take 1 Scoop by mouth daily as needed (instead of meal). Also use protein drink or bar     tamsulosin (FLOMAX) 0.4 MG CAPS capsule Take 1 capsule by mouth once daily 90 capsule 0   testosterone cypionate (DEPOTESTOSTERONE CYPIONATE) 200 MG/ML injection  Inject 200 mg into the muscle once a week.     tirzepatide (ZEPBOUND) 2.5 MG/0.5ML Pen Inject 2.5 mg into the skin once a week. R73.03 prediabetes 2 mL 11   No current facility-administered medications on file prior to visit.        ROS:  All others reviewed and negative.  Objective        PE:  BP 128/80 (BP Location: Right Arm, Patient Position: Sitting, Cuff Size: Normal)   Pulse (!) 41   Temp 98.1 F (36.7 C) (Oral)   Ht 5\' 7"  (1.702 m)   Wt 274 lb (124.3 kg)   SpO2 97%   BMI 42.91 kg/m  Constitutional: Pt appears in NAD               HENT: Head: NCAT.                Right Ear: External ear normal.                 Left Ear: External ear normal.                Eyes: . Pupils are equal, round, and reactive to light. Conjunctivae and EOM are normal               Nose: without d/c or deformity               Neck: Neck supple. Gross normal ROM               Cardiovascular: Normal rate and regular rhythm.                 Pulmonary/Chest: Effort normal and breath sounds without rales or wheezing.                Abd:  Soft, NT, ND, + BS, no organomegaly               Neurological: Pt is alert. At baseline orientation, motor grossly intact               Skin: Skin is warm. No rashes, no other new lesions, LE edema - none               Psychiatric: Pt behavior is normal without agitation   Micro: none  Cardiac tracings I have personally interpreted today:  ECG - SR with freq PVC/bigeminy 60  Pertinent Radiological findings (summarize): none   Lab Results  Component Value Date   WBC 6.5 06/18/2023   HGB 15.7 06/18/2023   HCT 48.3 06/18/2023   PLT 179.0 06/18/2023   GLUCOSE 93 06/18/2023   CHOL 135 06/18/2023   TRIG 83.0 06/18/2023   HDL 47.70 06/18/2023   LDLDIRECT 139.0 01/02/2016   LDLCALC 71 06/18/2023   ALT 31 06/18/2023   AST 23 06/18/2023   NA 137 06/18/2023   K 3.9 06/18/2023   CL 97 06/18/2023   CREATININE 1.45 06/18/2023   BUN 15 06/18/2023    CO2 32 06/18/2023   TSH 1.28 06/18/2023   PSA 1.86 06/18/2023   HGBA1C 6.1 06/18/2023   MICROALBUR 2.6 (H) 06/18/2023   Assessment/Plan:  Dreydan Sotto is a 57 y.o. White or Caucasian [1] male with  has a past medical history of Anxiety, BPH (benign prostatic hypertrophy), Chronic lower back pain, Depression, Diarrhea (03/23/2019), Family history of adverse reaction to anesthesia, GERD (gastroesophageal reflux disease), History of panic attacks, Hyperlipidemia, Hypertension, Meatal stenosis, OSA on CPAP, and PONV (postoperative nausea and vomiting).  Hyperlipidemia Lab Results  Component Value Date   LDLCALC 71 06/18/2023   Uncontrolled, goal ldl < 70, pt to continue current statin zetia 10 every day, pravachol 80 qd   Hyperglycemia Lab Results  Component Value Date   HGBA1C 6.1 06/18/2023   Stable, pt to continue current medical treatment  - diet, wt control   Frequent PVCs Asympt, declines need for prn BB,  to f/u any worsening symptoms or concerns  Essential hypertension BP Readings from Last 3 Encounters:  07/09/23 128/80  06/22/23 122/80  05/14/23 139/84   Stable, pt to continue medical treatment norvasc 10 every day, lotensin 40 every day, cardura 4 at bedtime,  hct 25 qd   CKD (chronic kidney disease) stage 3, GFR 30-59 ml/min (HCC) Lab Results  Component Value Date   CREATININE 1.45 06/18/2023   Stable overall, cont to avoid nephrotoxins   Allergic rhinitis Mild to mod, for otc nasacort asd, to f/u any worsening symptoms or concerns  Followup: Return in about 6 months (around 01/06/2024).  Oliver Barre, MD 07/11/2023 11:48 AM [facility]

## 2023-07-09 NOTE — Patient Instructions (Signed)
Your EKG was done today  Please continue all other medications as before, and refills have been done if requested.  Please have the pharmacy call with any other refills you may need.  Please continue your efforts at being more active, low cholesterol diet, and weight control.  Please keep your appointments with your specialists as you may have planned  Please make an Appointment to return in 6 months, or sooner if needed, also with Lab Appointment for testing done 3-5 days before at the FIRST FLOOR Lab (so this is for TWO appointments - please see the scheduling desk as you leave)

## 2023-07-11 ENCOUNTER — Encounter: Payer: Self-pay | Admitting: Internal Medicine

## 2023-07-11 DIAGNOSIS — I493 Ventricular premature depolarization: Secondary | ICD-10-CM | POA: Insufficient documentation

## 2023-07-11 DIAGNOSIS — R001 Bradycardia, unspecified: Secondary | ICD-10-CM | POA: Insufficient documentation

## 2023-07-11 NOTE — Addendum Note (Signed)
Addended by: Corwin Levins on: 07/11/2023 12:05 PM   Modules accepted: Orders, Level of Service

## 2023-07-11 NOTE — Assessment & Plan Note (Signed)
Asympt, declines need for prn BB,  to f/u any worsening symptoms or concerns

## 2023-07-11 NOTE — Assessment & Plan Note (Signed)
Lab Results  Component Value Date   CREATININE 1.45 06/18/2023   Stable overall, cont to avoid nephrotoxins

## 2023-07-11 NOTE — Assessment & Plan Note (Signed)
Lab Results  Component Value Date   HGBA1C 6.1 06/18/2023   Stable, pt to continue current medical treatment  - diet, wt control

## 2023-07-11 NOTE — Assessment & Plan Note (Signed)
Lab Results  Component Value Date   LDLCALC 71 06/18/2023   Uncontrolled, goal ldl < 70, pt to continue current statin zetia 10 every day, pravachol 80 qd

## 2023-07-11 NOTE — Assessment & Plan Note (Signed)
BP Readings from Last 3 Encounters:  07/09/23 128/80  06/22/23 122/80  05/14/23 139/84   Stable, pt to continue medical treatment norvasc 10 every day, lotensin 40 every day, cardura 4 at bedtime, hct 25 qd

## 2023-07-11 NOTE — Assessment & Plan Note (Signed)
Mild to mod, for otc nasacort asd, to f/u any worsening symptoms or concerns ?

## 2023-07-15 DIAGNOSIS — E291 Testicular hypofunction: Secondary | ICD-10-CM | POA: Diagnosis not present

## 2023-07-31 ENCOUNTER — Other Ambulatory Visit: Payer: Self-pay | Admitting: Internal Medicine

## 2023-08-02 ENCOUNTER — Other Ambulatory Visit: Payer: Self-pay | Admitting: Internal Medicine

## 2023-08-02 DIAGNOSIS — G4733 Obstructive sleep apnea (adult) (pediatric): Secondary | ICD-10-CM | POA: Diagnosis not present

## 2023-08-03 ENCOUNTER — Other Ambulatory Visit: Payer: Self-pay

## 2023-08-03 ENCOUNTER — Telehealth: Payer: Self-pay

## 2023-08-03 NOTE — Telephone Encounter (Signed)
Patient requesting refill for Oxycodone Last Fill per PMP:  06/22/2023 06/22/2023 1  Oxycodone-Acetaminophen 5-325 70.00 18 Me Lov [id number] [facility] ([id number]) 0/0 29.17 MME Medicare [place]

## 2023-08-03 NOTE — Telephone Encounter (Signed)
PMP was Reviewed.  He has Oxycodone prescription on file, call placed to Darren Black, no answer, unable to leave message.

## 2023-08-05 DIAGNOSIS — Z135 Encounter for screening for eye and ear disorders: Secondary | ICD-10-CM | POA: Diagnosis not present

## 2023-08-05 DIAGNOSIS — H5213 Myopia, bilateral: Secondary | ICD-10-CM | POA: Diagnosis not present

## 2023-08-06 ENCOUNTER — Other Ambulatory Visit: Payer: Self-pay

## 2023-08-06 ENCOUNTER — Other Ambulatory Visit: Payer: Self-pay | Admitting: Internal Medicine

## 2023-08-08 ENCOUNTER — Other Ambulatory Visit: Payer: Self-pay | Admitting: Internal Medicine

## 2023-08-10 ENCOUNTER — Other Ambulatory Visit: Payer: Self-pay

## 2023-08-18 ENCOUNTER — Encounter: Payer: Self-pay | Admitting: Registered Nurse

## 2023-08-18 ENCOUNTER — Encounter: Payer: Medicare HMO | Attending: Registered Nurse | Admitting: Registered Nurse

## 2023-08-18 VITALS — BP 120/85 | HR 66 | Ht 67.0 in | Wt 270.0 lb

## 2023-08-18 DIAGNOSIS — M25562 Pain in left knee: Secondary | ICD-10-CM | POA: Insufficient documentation

## 2023-08-18 DIAGNOSIS — G894 Chronic pain syndrome: Secondary | ICD-10-CM | POA: Diagnosis not present

## 2023-08-18 DIAGNOSIS — G8929 Other chronic pain: Secondary | ICD-10-CM | POA: Diagnosis not present

## 2023-08-18 DIAGNOSIS — Z79891 Long term (current) use of opiate analgesic: Secondary | ICD-10-CM | POA: Diagnosis not present

## 2023-08-18 DIAGNOSIS — Z5181 Encounter for therapeutic drug level monitoring: Secondary | ICD-10-CM | POA: Insufficient documentation

## 2023-08-18 DIAGNOSIS — M2550 Pain in unspecified joint: Secondary | ICD-10-CM | POA: Diagnosis not present

## 2023-08-18 DIAGNOSIS — M542 Cervicalgia: Secondary | ICD-10-CM | POA: Diagnosis not present

## 2023-08-18 DIAGNOSIS — M5416 Radiculopathy, lumbar region: Secondary | ICD-10-CM | POA: Insufficient documentation

## 2023-08-18 DIAGNOSIS — M25561 Pain in right knee: Secondary | ICD-10-CM | POA: Diagnosis not present

## 2023-08-18 MED ORDER — OXYCODONE-ACETAMINOPHEN 5-325 MG PO TABS: 1.0000 | ORAL_TABLET | Freq: Three times a day (TID) | ORAL | 0 refills | Status: DC | PRN

## 2023-08-18 NOTE — Progress Notes (Signed)
Subjective:    Patient ID: Darren Black, male    DOB: 1965-09-14, 57 y.o.   MRN: [id number]  HPI: Darren Black is a 57 y.o. male who returns for follow up appointment for chronic pain and medication refill. He states his pain is located in his neck, lower back pain radiating into his right groin and bilateral knee pain. Also reports generalized joint pain. He rates his pain 9. His current exercise regime is walking and performing stretching exercises.  Mr. Viator Morphine equivalent is 21.88 MME. He is also prescribed Lorazepam  by Dr. Jonny Ruiz .We have discussed the black box warning of using opioids and benzodiazepines. I highlighted the dangers of using these drugs together and discussed the adverse events including respiratory suppression, overdose, cognitive impairment and importance of compliance with current regimen. We will continue to monitor and adjust as indicated.    UDS ordered today.   Pain Inventory Average Pain 9 Pain Right Now 9 My pain is sharp, dull, and stabbing  In the last 24 hours, has pain interfered with the following? General activity 9 Relation with others 8 Enjoyment of life 9 What TIME of day is your pain at its worst? morning , daytime, evening, and night Sleep (in general) Poor  Pain is worse with: walking, bending, sitting, inactivity, standing, and some activites Pain improves with: medication Relief from Meds: 1  Family History  Problem Relation Age of Onset   Coronary artery disease Father    Cancer Father        anaplastic thyroid cancer   Stroke Other        1st degree relative   Diabetes Other        1st degree relative   Hypertension Other    Hyperlipidemia Other    Social History   Socioeconomic History   Marital status: Married    Spouse name: Not on file   Number of children: Not on file   Years of education: Not on file   Highest education level: Not on file  Occupational History   Occupation: disabled, LBP and anxiety   Tobacco Use   Smoking status: Never   Smokeless tobacco: Never  Vaping Use   Vaping status: Never Used  Substance and Sexual Activity   Alcohol use: Yes    Comment: rare   Drug use: No   Sexual activity: Not on file  Other Topics Concern   Not on file  Social History Narrative   Not on file   Social Drivers of Health   Financial Resource Strain: Low Risk  (01/22/2022)   Overall Financial Resource Strain (CARDIA)    Difficulty of Paying Living Expenses: Not hard at all  Food Insecurity: No Food Insecurity (05/20/2023)   Hunger Vital Sign    Worried About Running Out of Food in the Last Year: Never true    Ran Out of Food in the Last Year: Never true  Transportation Needs: No Transportation Needs (05/20/2023)   PRAPARE - Administrator, Civil Service (Medical): No    Lack of Transportation (Non-Medical): No  Physical Activity: Inactive (01/22/2022)   Exercise Vital Sign    Days of Exercise per Week: 0 days    Minutes of Exercise per Session: 0 min  Stress: No Stress Concern Present (01/22/2022)   [employer] of Occupational Health - Occupational Stress Questionnaire    Feeling of Stress : Not at all  Social Connections: Moderately Isolated (01/22/2022)   Social Connection and Isolation Panel [  NHANES]    Frequency of Communication with Friends and Family: More than three times a week    Frequency of Social Gatherings with Friends and Family: Once a week    Attends Religious Services: Never    Database administrator or Organizations: No    Attends Engineer, structural: Never    Marital Status: Married   Past Surgical History:  Procedure Laterality Date   APPENDECTOMY  2003   BREATH Nelda Bucks  04/27/2012   Procedure: BREATH TEK H PYLORI;  Surgeon: Valarie Merino, MD;  Location: [facility];  Service: General;  Laterality: N/A;   CHOLECYSTECTOMY N/A 03/24/2019   Procedure: LAPAROSCOPIC CHOLECYSTECTOMY WITH INTRAOPERATIVE CHOLANGIOGRAM;  Surgeon:  Manus Rudd, MD;  Location: [facility];  Service: General;  Laterality: N/A;   CYSTOSCOPY W/ URETERAL STENT PLACEMENT Left 05/13/2023   Procedure: CYSTOSCOPY WITH RETROGRADE PYELOGRAM/URETERAL STENT PLACEMENT AND INDOCYANINE GREEN DYE INJECTION;  Surgeon: Loletta Parish., MD;  Location: [facility];  Service: Urology;  Laterality: Left;   CYSTOSCOPY WITH URETHRAL DILATATION N/A 04/12/2015   Procedure: CYSTOSCOPY WITH URETHRAL DILATATION, RETROGRADE AND URETHROGRAM WITH BLADDER BIOPSY;  Surgeon: Bjorn Pippin, MD;  [facility]: [facility];  Service: Urology;  Laterality: N/A;   ROBOTIC ASSISTED LAPAROSCOPIC BLADDER DIVERTICULECTOMY N/A 05/13/2023   Procedure: XI ROBOTIC ASSISTED LAPAROSCOPIC PARTIAL CYSTECTOMY, OPNE UMBILICAL HERNIA REPAIR;  Surgeon: Loletta Parish., MD;  Location: [facility];  Service: Urology;  Laterality: N/A;   ROTATOR CUFF REPAIR Right 01-21-2012   TRANSTHORACIC ECHOCARDIOGRAM  03-26-2007   normal LVSF, ef 60%,  mild AV calcification without stenosis,  mild MR and TR,  mild LAE   URETHROGRAM N/A 04/12/2015   Procedure: URETHROGRAM;  Surgeon: Bjorn Pippin, MD;  Location: [facility];  Service: Urology;  Laterality: N/A;   Past Surgical History:  Procedure Laterality Date   APPENDECTOMY  2003   BREATH TEK H PYLORI  04/27/2012   Procedure: BREATH TEK H PYLORI;  Surgeon: Valarie Merino, MD;  Location: [facility];  Service: General;  Laterality: N/A;   CHOLECYSTECTOMY N/A 03/24/2019   Procedure: LAPAROSCOPIC CHOLECYSTECTOMY WITH INTRAOPERATIVE CHOLANGIOGRAM;  Surgeon: Manus Rudd, MD;  Location: [facility];  Service: General;  Laterality: N/A;   CYSTOSCOPY W/ URETERAL STENT PLACEMENT Left 05/13/2023   Procedure: CYSTOSCOPY WITH RETROGRADE PYELOGRAM/URETERAL STENT PLACEMENT AND INDOCYANINE GREEN DYE INJECTION;  Surgeon: Loletta Parish., MD;  Location: [facility];  Service: Urology;  Laterality: Left;   CYSTOSCOPY WITH URETHRAL DILATATION N/A 04/12/2015    Procedure: CYSTOSCOPY WITH URETHRAL DILATATION, RETROGRADE AND URETHROGRAM WITH BLADDER BIOPSY;  Surgeon: Bjorn Pippin, MD;  Location: [facility];  Service: Urology;  Laterality: N/A;   ROBOTIC ASSISTED LAPAROSCOPIC BLADDER DIVERTICULECTOMY N/A 05/13/2023   Procedure: XI ROBOTIC ASSISTED LAPAROSCOPIC PARTIAL CYSTECTOMY, OPNE UMBILICAL HERNIA REPAIR;  Surgeon: Loletta Parish., MD;  Location: [facility];  Service: Urology;  Laterality: N/A;   ROTATOR CUFF REPAIR Right 01-21-2012   TRANSTHORACIC ECHOCARDIOGRAM  03-26-2007   normal LVSF, ef 60%,  mild AV calcification without stenosis,  mild MR and TR,  mild LAE   URETHROGRAM N/A 04/12/2015   Procedure: URETHROGRAM;  Surgeon: Bjorn Pippin, MD;  Location: [facility];  Service: Urology;  Laterality: N/A;   Past Medical History:  Diagnosis Date   Anxiety    BPH (benign prostatic hypertrophy)    Chronic lower back pain    nerve damage w/ leg pain   Depression  Diarrhea 03/23/2019   Family history of adverse reaction to anesthesia    MOTHER--- HARD TO WAKE   GERD (gastroesophageal reflux disease)    History of panic attacks    Hyperlipidemia    Hypertension    Meatal stenosis    OSA on CPAP    moderate to severe OSA  per study 04-28-2007 uses CPAP   PONV (postoperative nausea and vomiting)    Hard to wake up   BP 120/85   Pulse 66   Ht 5\' 7"  (1.702 m)   Wt 270 lb (122.5 kg)   SpO2 98%   BMI 42.29 kg/m   Opioid Risk Score:   Fall Risk Score:  `1  Depression screen [facility] 2/9     08/18/2023   11:27 AM 07/09/2023    1:12 PM 06/22/2023    1:04 PM 01/12/2023    3:30 PM 09/22/2022    1:55 PM 07/14/2022    3:01 PM 06/20/2022   11:34 AM  Depression screen PHQ 2/9  Decreased Interest 0 0 1 0 0 0 3  Down, Depressed, Hopeless 0 0 1 0 0 0 3  PHQ - 2 Score 0 0 2 0 0 0 6  Altered sleeping      0   Tired, decreased energy      0   Change in appetite      0   Feeling bad or failure about yourself       0    Trouble concentrating      0   Moving slowly or fidgety/restless      0   Suicidal thoughts      0   PHQ-9 Score      0   Difficult doing work/chores      Not difficult at all       Review of Systems  Musculoskeletal:  Positive for back pain, gait problem and neck pain.  All other systems reviewed and are negative.     Objective:   Physical Exam Vitals and nursing note reviewed.  Constitutional:      Appearance: Normal appearance. He is obese.  Neck:     Comments: Cervical Paraspinal Tenderness: C-5-C-6  Cardiovascular:     Rate and Rhythm: Normal rate and regular rhythm.     Pulses: Normal pulses.     Heart sounds: Normal heart sounds.  Pulmonary:     Effort: Pulmonary effort is normal.     Breath sounds: Normal breath sounds.  Musculoskeletal:     Comments: Normal Muscle Bulk and Muscle Testing Reveals:  Upper Extremities:Full  ROM and Muscle Strength  5/5 Right AC Joint Tenderness Thoracic Paraspinal Tenderness: T-4-T-7  Lumbar Hypersensitivity Bilateral Greater Trochanter Tenderness: R>L Lower Extremities: Decreased ROM and Muscle Strength 5/5 Bilateral Lower Extremities Flexion Produces Pain into her Lumbar and Bilateral Patella's Arises from Table slowly Antalgic  Gait     Skin:    General: Skin is warm and dry.  Neurological:     Mental Status: He is alert and oriented to person, place, and time.  Psychiatric:        Mood and Affect: Mood normal.        Behavior: Behavior normal.         Assessment & Plan:  Lumbar Radiculitis: Continue HEP as Tolerated. Continue current medication regimen. Continue to Monitor. 08/18/2023 Chronic Bilateral Thoracic Pain: No complaints today. Continue HEP as Tolerated. Continue to Monitor. 08/18/2023 Depression/ Anxiety: PCP Following. Continue current Medication Regimen. Continue  to Monitor. 08/18/2023 Chronic Pain Syndrome: Refilled: Oxycodone 5/325 mg one tablet every 6 hours as needed for pain #70. Second script sent  for the following month.We will continue the opioid monitoring program, this consists of regular clinic visits, examinations, urine drug screen, pill counts as well as use of [place] Controlled Substance Reporting system. A 12 month History has been reviewed on the [place] Controlled Substance Reporting System on 08/18/2023.     F/U in 2  months.

## 2023-08-21 ENCOUNTER — Telehealth: Payer: Self-pay | Admitting: Registered Nurse

## 2023-08-21 LAB — TOXASSURE SELECT,+ANTIDEPR,UR

## 2023-08-21 NOTE — Telephone Encounter (Signed)
PMP was Reviewed.  UDS consistent.

## 2023-09-07 ENCOUNTER — Other Ambulatory Visit: Payer: Self-pay

## 2023-09-07 ENCOUNTER — Other Ambulatory Visit: Payer: Self-pay | Admitting: Internal Medicine

## 2023-09-15 ENCOUNTER — Ambulatory Visit: Payer: Self-pay | Admitting: Internal Medicine

## 2023-09-15 ENCOUNTER — Encounter: Payer: Self-pay | Admitting: Internal Medicine

## 2023-09-15 NOTE — Telephone Encounter (Signed)
 Copied from CRM [id number]. Topic: Clinical - Red Word Triage >> Sep 15, 2023  1:31 PM Deidre DASEN wrote: Red Word that prompted transfer to Nurse Triage: patient has a hernia by his belly button   Chief Complaint:  Hernia  at Belly button Symptoms: Painful and tender to touch Frequency:  x 1 week  Disposition: [x] ED /[] Urgent Care (no appt availability in office) / [] Appointment(In office/virtual)/ []  [facility]/ [] Home Care/ [] Refused Recommended Disposition /[] [facility]/ []  Follow-up with PCP Additional Notes:  Patient states he will call his last surgeon  only if Dr Norleen will not call him. RN Agent advised ER Now and not appointments available for his provider. Patient provided review and recommendations. Patient verbalized understanding to go to ER now. Reason for Disposition  Hernia is painful or tender to touch  Answer Assessment - Initial Assessment Questions 1. ONSET:  When did this first appear?     Slowly began to herniate over the least week.  2024 Hernia in and around his  hernia-  Patient reports he had bladder surgery   he sneezed and heard something pop. 2. APPEARANCE: What does it look like?      3. SIZE: How big is it? (inches, cm or compare to coins, fruit)     Size of  lemon 4. LOCATION: Where exactly is the hernia located?     At the belly button  on the  left side  5. PATTERN: Does the swelling come and go, or has it been constant since it started? Same  size  on today.      6. PAIN: Is there any pain? If Yes, ask: How bad is it?  (Scale 1-10; or mild, moderate, severe)    - MILD (1-3): Doesn't interfere with normal activities, abdomen soft and not tender to touch.     - MODERATE (4-7): Interferes with normal activities or awakens from sleep, abdomen tender to touch.       Rates pain as a 6 out of 10        7. DIAGNOSIS: Have you been seen by a doctor (or NP/PA) for this? Did the doctor diagnose you as having a hernia?       No has not been seen. 8. OTHER SYMPTOMS: Do you have any other symptoms? (e.g., fever, abdomen pain, vomiting)      Abdomen pain  x 1 week  Protocols used: Hernia-A-AH

## 2023-09-23 ENCOUNTER — Ambulatory Visit: Payer: Self-pay | Admitting: Surgery

## 2023-09-23 DIAGNOSIS — K432 Incisional hernia without obstruction or gangrene: Secondary | ICD-10-CM | POA: Diagnosis not present

## 2023-09-25 ENCOUNTER — Other Ambulatory Visit: Payer: Self-pay

## 2023-09-25 ENCOUNTER — Other Ambulatory Visit: Payer: Self-pay | Admitting: Internal Medicine

## 2023-09-25 NOTE — Patient Instructions (Signed)
SURGICAL WAITING ROOM VISITATION  Patients having surgery or a procedure may have no more than 2 support people in the waiting area - these visitors may rotate.    Children under the age of 1 must have an adult with them who is not the patient.  Due to an increase in RSV and influenza rates and associated hospitalizations, children ages 75 and under may not visit patients in [facility] hospitals.  Visitors with respiratory illnesses are discouraged from visiting and should remain at home.  If the patient needs to stay at the hospital during part of their recovery, the visitor guidelines for inpatient rooms apply. Pre-op nurse will coordinate an appropriate time for 1 support person to accompany patient in pre-op.  This support person may not rotate.    Please refer to the [facility] website for the visitor guidelines for Inpatients (after your surgery is over and you are in a regular room).       Your procedure is scheduled on: 10/06/23   Report to [facility] Main Entrance    Report to admitting at  7:15 AM   Call this number if you have problems the morning of surgery [phone]   Do not eat food or drink liquids:After Midnight.        Oral Hygiene is also important to reduce your risk of infection.                                    Remember - BRUSH YOUR TEETH THE MORNING OF SURGERY WITH YOUR REGULAR TOOTHPASTE   Stop all vitamins and herbal supplements 7 days before surgery.   Take these medicines the morning of surgery with A SIP OF WATER: Amlodipine, cetirizine, cardura, cymbalta, ezetimibe, pravachol, tamsulosin, ativan if needed, nasal spray if needed.  DO NOT TAKE ANY ORAL DIABETIC MEDICATIONS DAY OF YOUR SURGERY  Bring CPAP mask and tubing day of surgery.                              You may not have any metal on your body including hair pins, jewelry, and body piercing             Do not wear make-up, lotions, powders, perfumes/cologne, or  deodorant              Men may shave face and neck.   Do not bring valuables to the hospital. [place] IS NOT             RESPONSIBLE   FOR VALUABLES.   Contacts, glasses, dentures or bridgework may not be worn into surgery.  DO NOT BRING YOUR HOME MEDICATIONS TO THE HOSPITAL. PHARMACY WILL DISPENSE MEDICATIONS LISTED ON YOUR MEDICATION LIST TO YOU DURING YOUR ADMISSION IN THE HOSPITAL!    Patients discharged on the day of surgery will not be allowed to drive home.  Someone NEEDS to stay with you for the first 24 hours after anesthesia.   Special Instructions: Bring a copy of your healthcare power of attorney and living will documents the day of surgery if you haven't scanned them before.              Please read over the following fact sheets you were given: IF YOU HAVE QUESTIONS ABOUT YOUR PRE-OP INSTRUCTIONS PLEASE CALL [phone] Rosey [place]   If you received a COVID test during  your pre-op visit  it is requested that you wear a mask when out in public, stay away from anyone that may not be feeling well and notify your surgeon if you develop symptoms. If you test positive for Covid or have been in contact with anyone that has tested positive in the last 10 days please notify you surgeon.    [facility] - Preparing for Surgery Before surgery, you can play an important role.  Because skin is not sterile, your skin needs to be as free of germs as possible.  You can reduce the number of germs on your skin by washing with CHG (chlorahexidine gluconate) soap before surgery.  CHG is an antiseptic cleaner which kills germs and bonds with the skin to continue killing germs even after washing. Please DO NOT use if you have an allergy to CHG or antibacterial soaps.  If your skin becomes reddened/irritated stop using the CHG and inform your nurse when you arrive at Short Stay. Do not shave (including legs and underarms) for at least 48 hours prior to the first CHG shower.  You may shave your  face/neck.  Please follow these instructions carefully:  1.  Shower with CHG Soap the night before surgery and the  morning of surgery.  2.  If you choose to wash your hair, wash your hair first as usual with your normal  shampoo.  3.  After you shampoo, rinse your hair and body thoroughly to remove the shampoo.                             4.  Use CHG as you would any other liquid soap.  You can apply chg directly to the skin and wash.  Gently with a scrungie or clean washcloth.  5.  Apply the CHG Soap to your body ONLY FROM THE NECK DOWN.   Do   not use on face/ open                           Wound or open sores. Avoid contact with eyes, ears mouth and   genitals (private parts).                       Wash face,  Genitals (private parts) with your normal soap.             6.  Wash thoroughly, paying special attention to the area where your    surgery  will be performed.  7.  Thoroughly rinse your body with warm water from the neck down.  8.  DO NOT shower/wash with your normal soap after using and rinsing off the CHG Soap.                9.  Pat yourself dry with a clean towel.            10.  Wear clean pajamas.            11.  Place clean sheets on your bed the night of your first shower and do not  sleep with pets. Day of Surgery : Do not apply any lotions/deodorants the morning of surgery.  Please wear clean clothes to the hospital/surgery center.  FAILURE TO FOLLOW THESE INSTRUCTIONS MAY RESULT IN THE CANCELLATION OF YOUR SURGERY  PATIENT SIGNATURE_________________________________  NURSE SIGNATURE__________________________________  ________________________________________________________________________

## 2023-09-25 NOTE — Progress Notes (Signed)
COVID Vaccine received:  []  No [x]  Yes Date of any COVID positive Test in last 90 days: no PCP - Oliver Barre MD Cardiologist - none  Chest x-ray -  EKG -  07/09/23 Epic Stress Test -  ECHO - 03/26/07 Epic Cardiac Cath -   Bowel Prep - [x]  No  []   Yes ______  Pacemaker / ICD device [x]  No []  Yes   Spinal Cord Stimulator:[x]  No []  Yes       History of Sleep Apnea? []  No [x]  Yes   CPAP used?- []  No [x]  Yes    Does the patient monitor blood sugar?          [x]  No []  Yes  []  N/A  Patient has: [x]  NO Hx DM   []  Pre-DM                 []  DM1  []   DM2 Does patient have a [employer] or Dexacom? []  No []  Yes   Fasting Blood Sugar Ranges-  Checks Blood Sugar _____ times a day  GLP1 agonist / usual dose - no GLP1 instructions:  SGLT-2 inhibitors / usual dose - no SGLT-2 instructions:   Blood Thinner / Instructions:no Aspirin Instructions:no  Comments:   Activity level: Patient is able to climb a flight of stairs without difficulty; [x]  No CP  [x]  No SOB, __   Patient can perform ADLs without assistance.   Anesthesia review:   Patient denies shortness of breath, fever, cough and chest pain at PAT appointment.  Patient verbalized understanding and agreement to the Pre-Surgical Instructions that were given to them at this PAT appointment. Patient was also educated of the need to review these PAT instructions again prior to his/her surgery.I reviewed the appropriate phone numbers to call if they have any and questions or concerns.

## 2023-09-28 ENCOUNTER — Other Ambulatory Visit: Payer: Self-pay

## 2023-09-28 ENCOUNTER — Encounter (HOSPITAL_COMMUNITY)
Admission: RE | Admit: 2023-09-28 | Discharge: 2023-09-28 | Disposition: A | Discharge status: Home / Self Care | Payer: Medicare HMO | Source: Ambulatory Visit | Attending: Surgery

## 2023-09-28 ENCOUNTER — Encounter (HOSPITAL_COMMUNITY): Payer: Self-pay

## 2023-09-28 VITALS — BP 127/88 | HR 48 | Temp 97.7°F | Resp 16 | Ht 67.0 in | Wt 270.0 lb

## 2023-09-28 DIAGNOSIS — Z01818 Encounter for other preprocedural examination: Secondary | ICD-10-CM | POA: Diagnosis not present

## 2023-09-28 DIAGNOSIS — Z01812 Encounter for preprocedural laboratory examination: Secondary | ICD-10-CM | POA: Diagnosis not present

## 2023-09-28 DIAGNOSIS — I10 Essential (primary) hypertension: Secondary | ICD-10-CM | POA: Diagnosis not present

## 2023-09-28 LAB — BASIC METABOLIC PANEL
Anion gap: 9 (ref 5–15)
BUN: 15 mg/dL (ref 6–20)
CO2: 28 mmol/L (ref 22–32)
Calcium: 9.5 mg/dL (ref 8.9–10.3)
Chloride: 102 mmol/L (ref 98–111)
Creatinine, Ser: 1.59 mg/dL — ABNORMAL HIGH (ref 0.61–1.24)
GFR, Estimated: 51 mL/min — ABNORMAL LOW (ref >60)
Glucose, Bld: 117 mg/dL — ABNORMAL HIGH (ref 70–99)
Potassium: 3.1 mmol/L — ABNORMAL LOW (ref 3.5–5.1)
Sodium: 139 mmol/L (ref 135–145)

## 2023-09-28 LAB — CBC
HCT: 53.1 % — ABNORMAL HIGH (ref 39.0–52.0)
Hemoglobin: 17.7 g/dL — ABNORMAL HIGH (ref 13.0–17.0)
MCH: 27.8 pg (ref 26.0–34.0)
MCHC: 33.3 g/dL (ref 30.0–36.0)
MCV: 83.4 fL (ref 80.0–100.0)
Platelets: 179 10*3/uL (ref 150–400)
RBC: 6.37 MIL/uL — ABNORMAL HIGH (ref 4.22–5.81)
RDW: 12.8 % (ref 11.5–15.5)
WBC: 7.7 10*3/uL (ref 4.0–10.5)
nRBC: 0 % (ref 0.0–0.2)

## 2023-10-06 ENCOUNTER — Ambulatory Visit (HOSPITAL_BASED_OUTPATIENT_CLINIC_OR_DEPARTMENT_OTHER): Payer: Medicare HMO | Admitting: Anesthesiology

## 2023-10-06 ENCOUNTER — Ambulatory Visit (HOSPITAL_COMMUNITY)
Admission: RE | Admit: 2023-10-06 | Discharge: 2023-10-06 | Disposition: A | Discharge status: Home / Self Care | Payer: Medicare HMO | Source: Ambulatory Visit | Attending: Surgery | Admitting: Surgery

## 2023-10-06 ENCOUNTER — Encounter (HOSPITAL_COMMUNITY): Payer: Self-pay | Admitting: Surgery

## 2023-10-06 ENCOUNTER — Other Ambulatory Visit: Payer: Self-pay

## 2023-10-06 ENCOUNTER — Encounter (HOSPITAL_COMMUNITY)
Admission: RE | Disposition: A | Discharge status: Home / Self Care | Payer: Self-pay | Source: Ambulatory Visit | Attending: Surgery

## 2023-10-06 ENCOUNTER — Ambulatory Visit (HOSPITAL_COMMUNITY): Payer: Medicare HMO | Admitting: Anesthesiology

## 2023-10-06 DIAGNOSIS — N400 Enlarged prostate without lower urinary tract symptoms: Secondary | ICD-10-CM | POA: Diagnosis not present

## 2023-10-06 DIAGNOSIS — Z8249 Family history of ischemic heart disease and other diseases of the circulatory system: Secondary | ICD-10-CM | POA: Insufficient documentation

## 2023-10-06 DIAGNOSIS — K430 Incisional hernia with obstruction, without gangrene: Secondary | ICD-10-CM | POA: Insufficient documentation

## 2023-10-06 DIAGNOSIS — G8929 Other chronic pain: Secondary | ICD-10-CM | POA: Diagnosis not present

## 2023-10-06 DIAGNOSIS — Z6841 Body Mass Index (BMI) 40.0 and over, adult: Secondary | ICD-10-CM

## 2023-10-06 DIAGNOSIS — G4733 Obstructive sleep apnea (adult) (pediatric): Secondary | ICD-10-CM

## 2023-10-06 DIAGNOSIS — G709 Myoneural disorder, unspecified: Secondary | ICD-10-CM | POA: Diagnosis not present

## 2023-10-06 DIAGNOSIS — F418 Other specified anxiety disorders: Secondary | ICD-10-CM | POA: Insufficient documentation

## 2023-10-06 DIAGNOSIS — I10 Essential (primary) hypertension: Secondary | ICD-10-CM | POA: Diagnosis not present

## 2023-10-06 DIAGNOSIS — K436 Other and unspecified ventral hernia with obstruction, without gangrene: Secondary | ICD-10-CM | POA: Diagnosis not present

## 2023-10-06 DIAGNOSIS — E669 Obesity, unspecified: Secondary | ICD-10-CM | POA: Insufficient documentation

## 2023-10-06 DIAGNOSIS — E785 Hyperlipidemia, unspecified: Secondary | ICD-10-CM | POA: Insufficient documentation

## 2023-10-06 HISTORY — PX: VENTRAL HERNIA REPAIR: SHX424

## 2023-10-06 SURGERY — REPAIR, HERNIA, VENTRAL, LAPAROSCOPIC
Anesthesia: General

## 2023-10-06 MED ORDER — BUPIVACAINE LIPOSOME 1.3 % IJ SUSP: 20.0000 mL | Freq: Once | INTRAMUSCULAR | Status: DC

## 2023-10-06 MED ORDER — MIDAZOLAM HCL 2 MG/2ML IJ SOLN: 1.0000 mg | INTRAMUSCULAR | Status: DC

## 2023-10-06 MED ORDER — CHLORHEXIDINE GLUCONATE 0.12 % MT SOLN
15.0000 mL | Freq: Once | OROMUCOSAL | Status: AC
  Administered 2023-10-06: 15 mL via OROMUCOSAL

## 2023-10-06 MED ORDER — BUPIVACAINE LIPOSOME 1.3 % IJ SUSP
INTRAMUSCULAR | Status: AC
  Filled 2023-10-06: qty 20

## 2023-10-06 MED ORDER — MIDAZOLAM HCL 5 MG/5ML IJ SOLN
INTRAMUSCULAR | Status: DC | PRN
  Administered 2023-10-06: 2 mg via INTRAVENOUS

## 2023-10-06 MED ORDER — DROPERIDOL 2.5 MG/ML IJ SOLN: 0.6250 mg | Freq: Once | INTRAMUSCULAR | Status: DC | PRN

## 2023-10-06 MED ORDER — OXYCODONE HCL 5 MG/5ML PO SOLN: 5.0000 mg | Freq: Once | ORAL | Status: DC | PRN

## 2023-10-06 MED ORDER — BUPIVACAINE-EPINEPHRINE 0.25% -1:200000 IJ SOLN
INTRAMUSCULAR | Status: AC
Start: 2023-10-06 — End: ?
  Filled 2023-10-06: qty 1

## 2023-10-06 MED ORDER — ONDANSETRON HCL 4 MG/2ML IJ SOLN
INTRAMUSCULAR | Status: AC
  Filled 2023-10-06: qty 2

## 2023-10-06 MED ORDER — EPHEDRINE 5 MG/ML INJ
INTRAVENOUS | Status: AC
  Filled 2023-10-06: qty 5

## 2023-10-06 MED ORDER — FENTANYL CITRATE PF 50 MCG/ML IJ SOSY: 25.0000 ug | PREFILLED_SYRINGE | INTRAMUSCULAR | Status: DC | PRN

## 2023-10-06 MED ORDER — ACETAMINOPHEN 10 MG/ML IV SOLN: 1000.0000 mg | Freq: Once | INTRAVENOUS | Status: DC | PRN

## 2023-10-06 MED ORDER — BUPIVACAINE LIPOSOME 1.3 % IJ SUSP
INTRAMUSCULAR | Status: DC | PRN
  Administered 2023-10-06: 20 mL

## 2023-10-06 MED ORDER — DEXAMETHASONE SODIUM PHOSPHATE 10 MG/ML IJ SOLN
INTRAMUSCULAR | Status: DC | PRN
  Administered 2023-10-06: 10 mg via INTRAVENOUS

## 2023-10-06 MED ORDER — ACETAMINOPHEN 500 MG PO TABS
ORAL_TABLET | ORAL | Status: AC
  Filled 2023-10-06: qty 2

## 2023-10-06 MED ORDER — PROPOFOL 10 MG/ML IV BOLUS
INTRAVENOUS | Status: AC
  Filled 2023-10-06: qty 20

## 2023-10-06 MED ORDER — OXYCODONE HCL 5 MG PO TABS: 5.0000 mg | ORAL_TABLET | Freq: Once | ORAL | Status: DC | PRN

## 2023-10-06 MED ORDER — ROCURONIUM BROMIDE 10 MG/ML (PF) SYRINGE
PREFILLED_SYRINGE | INTRAVENOUS | Status: AC
Start: 2023-10-06 — End: ?
  Filled 2023-10-06: qty 10

## 2023-10-06 MED ORDER — 0.9 % SODIUM CHLORIDE (POUR BTL) OPTIME
TOPICAL | Status: DC | PRN
  Administered 2023-10-06: 1000 mL

## 2023-10-06 MED ORDER — BUPIVACAINE-EPINEPHRINE 0.25% -1:200000 IJ SOLN
INTRAMUSCULAR | Status: DC | PRN
  Administered 2023-10-06: 30 mL

## 2023-10-06 MED ORDER — ROCURONIUM BROMIDE 10 MG/ML (PF) SYRINGE
PREFILLED_SYRINGE | INTRAVENOUS | Status: DC | PRN
  Administered 2023-10-06: 10 mg via INTRAVENOUS
  Administered 2023-10-06: 70 mg via INTRAVENOUS

## 2023-10-06 MED ORDER — LACTATED RINGERS IV SOLN: INTRAVENOUS | Status: DC

## 2023-10-06 MED ORDER — ACETAMINOPHEN 500 MG PO TABS
1000.0000 mg | ORAL_TABLET | ORAL | Status: AC
  Administered 2023-10-06: 1000 mg via ORAL

## 2023-10-06 MED ORDER — MIDAZOLAM HCL 2 MG/2ML IJ SOLN
INTRAMUSCULAR | Status: AC
  Filled 2023-10-06: qty 2

## 2023-10-06 MED ORDER — ONDANSETRON HCL 4 MG/2ML IJ SOLN
INTRAMUSCULAR | Status: DC | PRN
  Administered 2023-10-06: 4 mg via INTRAVENOUS

## 2023-10-06 MED ORDER — CHLORHEXIDINE GLUCONATE 4 % EX SOLN: 60.0000 mL | Freq: Once | CUTANEOUS | Status: DC

## 2023-10-06 MED ORDER — FENTANYL CITRATE (PF) 100 MCG/2ML IJ SOLN
INTRAMUSCULAR | Status: DC | PRN
  Administered 2023-10-06 (×2): 100 ug via INTRAVENOUS

## 2023-10-06 MED ORDER — OXYCODONE HCL 5 MG PO TABS: 5.0000 mg | ORAL_TABLET | Freq: Three times a day (TID) | ORAL | 0 refills | Status: AC | PRN

## 2023-10-06 MED ORDER — DOCUSATE SODIUM 100 MG PO CAPS: 100.0000 mg | ORAL_CAPSULE | Freq: Two times a day (BID) | ORAL | 0 refills | Status: AC

## 2023-10-06 MED ORDER — LIDOCAINE HCL (PF) 2 % IJ SOLN
INTRAMUSCULAR | Status: AC
  Filled 2023-10-06: qty 5

## 2023-10-06 MED ORDER — SUGAMMADEX SODIUM 200 MG/2ML IV SOLN
INTRAVENOUS | Status: DC | PRN
  Administered 2023-10-06: 400 mg via INTRAVENOUS

## 2023-10-06 MED ORDER — DEXAMETHASONE SODIUM PHOSPHATE 10 MG/ML IJ SOLN
INTRAMUSCULAR | Status: AC
Start: 2023-10-06 — End: ?
  Filled 2023-10-06: qty 1

## 2023-10-06 MED ORDER — ORAL CARE MOUTH RINSE: 15.0000 mL | Freq: Once | OROMUCOSAL | Status: AC

## 2023-10-06 MED ORDER — FENTANYL CITRATE (PF) 100 MCG/2ML IJ SOLN
INTRAMUSCULAR | Status: AC
  Filled 2023-10-06: qty 2

## 2023-10-06 MED ORDER — LIDOCAINE 2% (20 MG/ML) 5 ML SYRINGE
INTRAMUSCULAR | Status: DC | PRN
  Administered 2023-10-06: 100 mg via INTRAVENOUS

## 2023-10-06 MED ORDER — EPHEDRINE 5 MG/ML INJ
INTRAVENOUS | Status: AC
Start: 2023-10-06 — End: ?
  Filled 2023-10-06: qty 5

## 2023-10-06 MED ORDER — SODIUM CHLORIDE 0.9 % IV SOLN
3.0000 g | INTRAVENOUS | Status: AC
  Administered 2023-10-06: 3 g via INTRAVENOUS
  Filled 2023-10-06: qty 3

## 2023-10-06 MED ORDER — FENTANYL CITRATE PF 50 MCG/ML IJ SOSY: 50.0000 ug | PREFILLED_SYRINGE | INTRAMUSCULAR | Status: DC

## 2023-10-06 MED ORDER — EPHEDRINE SULFATE-NACL 50-0.9 MG/10ML-% IV SOSY
PREFILLED_SYRINGE | INTRAVENOUS | Status: DC | PRN
  Administered 2023-10-06: 10 mg via INTRAVENOUS
  Administered 2023-10-06: 5 mg via INTRAVENOUS
  Administered 2023-10-06: 15 mg via INTRAVENOUS

## 2023-10-06 MED ORDER — PROPOFOL 10 MG/ML IV BOLUS
INTRAVENOUS | Status: DC | PRN
  Administered 2023-10-06: 200 mg via INTRAVENOUS

## 2023-10-06 SURGICAL SUPPLY — 38 items
BAG COUNTER SPONGE SURGICOUNT (BAG) IMPLANT
BENZOIN TINCTURE PRP APPL 2/3 (GAUZE/BANDAGES/DRESSINGS) IMPLANT
BINDER ABDOMINAL 12 ML 46-62 (SOFTGOODS) IMPLANT
CABLE HIGH FREQUENCY MONO STRZ (ELECTRODE) ×1 IMPLANT
CHLORAPREP W/TINT 26 (MISCELLANEOUS) ×1 IMPLANT
CLSR STERI-STRIP ANTIMIC 1/2X4 (GAUZE/BANDAGES/DRESSINGS) IMPLANT
COVER SURGICAL LIGHT HANDLE (MISCELLANEOUS) ×1 IMPLANT
DERMABOND ADVANCED .7 DNX12 (GAUZE/BANDAGES/DRESSINGS) ×1 IMPLANT
DEVICE SECURE STRAP 25 ABSORB (INSTRUMENTS) IMPLANT
ELECT REM PT RETURN 15FT ADLT (MISCELLANEOUS) ×1 IMPLANT
GAUZE SPONGE 4X4 12PLY STRL (GAUZE/BANDAGES/DRESSINGS) IMPLANT
GLOVE BIO SURGEON STRL SZ 6 (GLOVE) ×1 IMPLANT
GLOVE INDICATOR 6.5 STRL GRN (GLOVE) ×1 IMPLANT
GLOVE SS BIOGEL STRL SZ 6 (GLOVE) ×1 IMPLANT
GOWN STRL REUS W/ TWL LRG LVL3 (GOWN DISPOSABLE) ×1 IMPLANT
GOWN STRL REUS W/ TWL XL LVL3 (GOWN DISPOSABLE) IMPLANT
GRASPER SUT TROCAR 14GX15 (MISCELLANEOUS) ×1 IMPLANT
IRRIG SUCT STRYKERFLOW 2 WTIP (MISCELLANEOUS)
IRRIGATION SUCT STRKRFLW 2 WTP (MISCELLANEOUS) IMPLANT
KIT BASIN OR (CUSTOM PROCEDURE TRAY) ×1 IMPLANT
KIT TURNOVER KIT A (KITS) IMPLANT
MARKER SKIN DUAL TIP RULER LAB (MISCELLANEOUS) ×1 IMPLANT
MESH VENTRLGHT ELLIPSE 8X6XMFL (Mesh Specialty) IMPLANT
PENCIL SMOKE EVACUATOR (MISCELLANEOUS) IMPLANT
SCISSORS LAP 5X35 DISP (ENDOMECHANICALS) ×1 IMPLANT
SET TUBE SMOKE EVAC HIGH FLOW (TUBING) ×1 IMPLANT
SHEARS HARMONIC 36 ACE (MISCELLANEOUS) IMPLANT
SLEEVE Z-THREAD 5X100MM (TROCAR) ×1 IMPLANT
SPIKE FLUID TRANSFER (MISCELLANEOUS) IMPLANT
STRIP CLOSURE SKIN 1/2X4 (GAUZE/BANDAGES/DRESSINGS) ×1 IMPLANT
SUT ETHIBOND 0 (SUTURE) ×2 IMPLANT
SUT MNCRL AB 4-0 PS2 18 (SUTURE) ×1 IMPLANT
SUT NOVA 0 T19/GS 22DT (SUTURE) IMPLANT
SUT VIC AB 3-0 SH 27XBRD (SUTURE) IMPLANT
TOWEL OR 17X26 10 PK STRL BLUE (TOWEL DISPOSABLE) ×1 IMPLANT
TRAY LAPAROSCOPIC (CUSTOM PROCEDURE TRAY) ×1 IMPLANT
TROCAR ADV FIXATION 12X100MM (TROCAR) ×1 IMPLANT
TROCAR Z-THREAD OPTICAL 5X100M (TROCAR) ×1 IMPLANT

## 2023-10-06 NOTE — Anesthesia Postprocedure Evaluation (Signed)
 Anesthesia Post Note  Patient: Darren Black  Procedure(s) Performed: LAPAROSCOPIC VENTRAL HERNIA repair with mesh     Patient location during evaluation: PACU Anesthesia Type: General Level of consciousness: awake and alert Pain management: pain level controlled Vital Signs Assessment: post-procedure vital signs reviewed and stable Respiratory status: spontaneous breathing, nonlabored ventilation, respiratory function stable and patient connected to nasal cannula oxygen Cardiovascular status: blood pressure returned to baseline and stable Postop Assessment: no apparent nausea or vomiting Anesthetic complications: no   No notable events documented.  Last Vitals:  Vitals:   10/06/23 1215 10/06/23 1230  BP: 103/60 104/73  Pulse: 66 70  Resp: 15   Temp: 36.7 C 36.5 C  SpO2: 93% 90%    Last Pain:  Vitals:   10/06/23 1230  TempSrc: Oral  PainSc: 3                  Thom JONELLE Peoples

## 2023-10-06 NOTE — Discharge Instructions (Addendum)
HERNIA REPAIR: POST OP INSTRUCTIONS   EAT Gradually transition to a high fiber diet with a fiber supplement over the next few weeks after discharge.  Start with a pureed / full liquid diet (see below)  WALK Walk an hour a day (cumulative- not all at once).  Control your pain to do that.    CONTROL PAIN Control pain so that you can walk, sleep, tolerate sneezing/coughing, and go up/down stairs.  HAVE A BOWEL MOVEMENT DAILY Keep your bowels regular to avoid problems.  OK to try a laxative to override constipation.  OK to use an antidiarrheal to slow down diarrhea.  Call if not better after 2 tries  CALL IF YOU HAVE PROBLEMS/CONCERNS Call if you are still struggling despite following these instructions. Call if you have concerns not answered by these instructions  ######################################################################    DIET: Follow a light bland diet & liquids the first 24 hours after arrival home, such as soup, liquids, starches, etc.  Be sure to drink plenty of fluids.  Quickly advance to a usual solid diet within a few days.  Avoid fast food or heavy meals initially as you are more likely to get nauseated or have irregular bowels.  A low-sugar, high-fiber diet for the rest of your life is ideal.   Take your usually prescribed home medications unless otherwise directed.  PAIN CONTROL: Pain is best controlled by a usual combination of three different methods TOGETHER: Ice/Heat Over the counter pain medication Prescription pain medication Most patients will experience some swelling and bruising around the hernia(s) such as the bellybutton, groins, or old incisions.  Ice packs or heating pads (30-60 minutes up to 6 times a day) will help. Use ice for the first few days to help decrease swelling and bruising, then switch to heat to help relax tight/sore spots and speed recovery.  Some people prefer to use ice alone, heat alone, alternating between ice & heat.  Experiment  to what works for you.  Swelling and bruising can take several weeks to resolve.   It is helpful to take an over-the-counter pain medication regularly for the first days: Naproxen (Aleve, etc)  Two 220mg  tabs twice a day OR Ibuprofen (Advil, etc) Three 200mg  tabs four times a day (every meal & bedtime) AND Acetaminophen (Tylenol, etc) 325-650mg  four times a day (every meal & bedtime) A  prescription for pain medication should be given to you upon discharge.  Take your pain medication as prescribed, IF NEEDED.  If you are having problems/concerns with the prescription medicine (does not control pain, nausea, vomiting, rash, itching, etc), please call us [phone] to see if we need to switch you to a different pain medicine that will work better for you and/or control your side effect better. If you need a refill on your pain medication, please contact your pharmacy.  They will contact our office to request authorization. Prescriptions will not be filled after 5 pm or on week-ends.  Avoid getting constipated.  Between the surgery and the pain medications, it is common to experience some constipation.  Increasing fluid intake and taking a fiber supplement (such as Metamucil, Citrucel, FiberCon, MiraLax, etc) 1-2 times a day regularly will usually help prevent this problem from occurring.  A mild laxative (prune juice, Milk of Magnesia, MiraLax, etc) should be taken according to package directions if there are no bowel movements after 48 hours.    Wash / shower every day, starting 2 days after surgery.  You may shower over  the steri strips or skin glue which are waterproof.  No rubbing, scrubbing, lotions or ointments to incision(s). Do not soak or submerge.   Remove your outer bandage 2 days after surgery. Steri strips (if present) will peel off after 1-2 weeks. Glue (if present) will flake off after about 2 weeks.  You may leave the incision open to air.  You may replace a dressing/Band-Aid to cover  an incision for comfort if you wish.  Continue to shower over incision(s) after the dressing is off.  ACTIVITIES as tolerated:   You may resume regular (light) daily activities beginning the next day--such as daily self-care, walking, climbing stairs--gradually increasing activities as tolerated.  Control your pain so that you can walk an hour a day.  If you can walk 30 minutes without difficulty, it is safe to try more intense activity such as jogging, treadmill, bicycling, low-impact aerobics, swimming, etc. Wear your abdominal binder at all times for the first week after surgery. After this, wear it when you are up and moving around.  Refrain from the most intensive and strenuous activity such as sit-ups, heavy lifting, contact sports, etc  Refrain from any heavy lifting or straining until 6 weeks after surgery.   DO NOT PUSH THROUGH PAIN.  Let pain be your guide: If it hurts to do something, don't do it.  Pain is your body warning you to avoid that activity for another week until the pain goes down. You may drive when you are no longer taking prescription pain medication, you can comfortably wear a seatbelt, and you can safely maneuver your car and apply brakes. You may have sexual intercourse when it is comfortable.   FOLLOW UP in our office Please call [facility] at [phone] to set up an appointment to see your surgeon in the office for a follow-up appointment approximately 2-3 weeks after your surgery. Make sure that you call for this appointment the day you arrive home to insure a convenient appointment time.  9.  If you have disability of FMLA / Family leave forms, please bring the forms to the office for processing.  (do not give to your surgeon).  WHEN TO CALL us [phone]: Poor pain control Reactions / problems with new medications (rash/itching, nausea, etc)  Fever over 101.5 F (38.5 C) Inability to urinate Nausea and/or vomiting Worsening swelling or bruising Continued  bleeding from incision. Increased pain, redness, or drainage from the incision   The clinic staff is available to answer your questions during regular business hours (8:30am-5pm).  Please don't hesitate to call and ask to speak to one of our nurses for clinical concerns.   If you have a medical emergency, go to the nearest emergency room or call 911.  A surgeon from [facility] is always on call at the hospitals in [facility], [place] [address], [place], [place]  [address] ?  [address] [address], [place], [place]   [address] MAIN: [phone] ? TOLL FREE: [phone] ? FAX: [phone] [email or web address]

## 2023-10-06 NOTE — Transfer of Care (Signed)
 Immediate Anesthesia Transfer of Care Note  Patient: Darren Black  Procedure(s) Performed: LAPAROSCOPIC VENTRAL HERNIA repair with mesh  Patient Location: PACU  Anesthesia Type:General  Level of Consciousness: sedated  Airway & Oxygen Therapy: Patient Spontanous Breathing and Patient connected to face mask oxygen  Post-op Assessment: Report given to RN and Post -op Vital signs reviewed and stable  Post vital signs: Reviewed and stable  Last Vitals:  Vitals Value Taken Time  BP    Temp    Pulse 67 10/06/23 1137  Resp 16 10/06/23 1137  SpO2 91 % 10/06/23 1137  Vitals shown include unfiled device data.  Last Pain:  Vitals:   10/06/23 0726  TempSrc: Oral  PainSc: 2          Complications: No notable events documented.

## 2023-10-06 NOTE — Anesthesia Preprocedure Evaluation (Addendum)
Anesthesia Evaluation  Patient identified by MRN, date of birth, ID band Patient awake    Reviewed: Allergy & Precautions, H&P , NPO status , Patient's Chart, lab work & pertinent test results  History of Anesthesia Complications Negative for: history of anesthetic complications  Airway Mallampati: II  TM Distance: >3 FB Neck ROM: Full    Dental no notable dental hx.    Pulmonary sleep apnea    Pulmonary exam normal breath sounds clear to auscultation       Cardiovascular hypertension, Normal cardiovascular exam Rhythm:Regular Rate:Normal     Neuro/Psych  PSYCHIATRIC DISORDERS Anxiety Depression     Neuromuscular disease    GI/Hepatic Neg liver ROS,GERD  ,,  Endo/Other  negative endocrine ROS    Renal/GU Renal disease  negative genitourinary   Musculoskeletal negative musculoskeletal ROS (+)    Abdominal   Peds negative pediatric ROS (+)  Hematology negative hematology ROS (+)   Anesthesia Other Findings Family hx of complications were " mother slow to wake up" and "sister had episode of vomiting during a colonoscopy while on ozempic".   He admits to PONV on one occasion, but otherwise several uncomplicated anesthetics in his hx.  Reproductive/Obstetrics negative OB ROS                             Anesthesia Physical Anesthesia Plan  ASA: 2  Anesthesia Plan: General   Post-op Pain Management: Tylenol PO (pre-op)*   Induction: Intravenous  PONV Risk Score and Plan: 3 and Ondansetron, Dexamethasone and Treatment may vary due to age or medical condition  Airway Management Planned: Oral ETT  Additional Equipment:   Intra-op Plan:   Post-operative Plan: Extubation in OR  Informed Consent: I have reviewed the patients History and Physical, chart, labs and discussed the procedure including the risks, benefits and alternatives for the proposed anesthesia with the patient or  authorized representative who has indicated his/her understanding and acceptance.     Dental advisory given  Plan Discussed with: CRNA  Anesthesia Plan Comments:         Anesthesia Quick Evaluation

## 2023-10-06 NOTE — H&P (Signed)
 Darren Black [id number]   Referring Provider: Alvaro Hummer, MD  Subjective   Chief Complaint: New Consultation (hernia)   History of Present Illness:  58 year old man with history of anxiety depression, BPH, chronic low back pain with associated leg pain; GERD, hyperlipidemia, hypertension, obstructive sleep apnea on CPAP, obesity, postop nausea vomiting and previous abdominal surgery including appendectomy (2003), cholecystectomy (Dr. Belinda, 2020), robotic assisted laparoscopic bladder diverticulectomy concomitant umbilical hernia repair (Dr. Alvaro, September 2024) presents with recurrent ventral hernia. He noticed this a few weeks ago and has intermittent discomfort in the area. No associated GI symptoms.  Of note he does take a GLP-1 inhibitor  Review of Systems: A complete review of systems was obtained from the patient. I have reviewed this information and discussed as appropriate with the patient. See HPI as well for other ROS.  Medical History: Past Medical History:  Diagnosis Date  Anxiety  Hyperlipidemia  Hypertension  Sleep apnea   There is no problem list on file for this patient.  Past Surgical History:  Procedure Laterality Date  APPENDECTOMY  CHOLECYSTECTOMY  Robotic assisted laparoscopic bladder diverticulectomy  Rotator cuff repair (Right)    Allergies  Allergen Reactions  Gabapentin  Swelling  Rosuvastatin  Calcium  Other (See Comments)  abd pain   Current Outpatient Medications on File Prior to Visit  Medication Sig Dispense Refill  amLODIPine  (NORVASC ) 10 MG tablet Take 1 tablet by mouth once daily  benazepriL  (LOTENSIN ) 40 MG tablet Take 1 tablet by mouth once daily  cetirizine (ZYRTEC) 10 MG tablet Take 10 mg by mouth once daily  diazePAM  (VALIUM ) 5 MG tablet Take 5 mg by mouth  docusate (COLACE) 100 MG capsule Take 100 mg by mouth 2 (two) times daily  doxazosin  (CARDURA ) 4 MG tablet Take 1 tablet by mouth at bedtime  DULoxetine  (CYMBALTA ) 60 MG  DR capsule Take 1 capsule by mouth every morning  ezetimibe  (ZETIA ) 10 mg tablet Take 1 tablet by mouth once daily  fluticasone  propionate (FLONASE ) 50 mcg/actuation nasal spray Place 2 sprays into both nostrils once daily  hydroCHLOROthiazide  (HYDRODIURIL ) 25 MG tablet Take 0.5 tablets by mouth once daily  LORazepam  (ATIVAN ) 1 MG tablet Take 1 mg by mouth 2 (two) times daily as needed  oxyCODONE -acetaminophen  (PERCOCET) 5-325 mg tablet TAKE 1 TABLET BY MOUTH EVERY 8 HOURS AS NEEDED FOR SEVERE PAIN, FOR CHRONIC PAIN-DO NOT FILL BEFORE 09/24/2023  potassium chloride  (KLOR-CON ) 10 MEQ ER tablet Take 20 mEq by mouth  pravastatin  (PRAVACHOL ) 80 MG tablet Take 1 tablet by mouth once daily  predniSONE  (DELTASONE ) 20 MG tablet Take 20 mg by mouth  protein Powd Take 1 Scoop by mouth  tamsulosin  (FLOMAX ) 0.4 mg capsule Take 1 capsule by mouth once daily  testosterone  cypionate (DEPO-TESTOSTERONE ) 200 mg/mL injection Inject 200 mg into the muscle  tirzepatide  (ZEPBOUND ) 2.5 mg/0.5 mL pen injector Inject 2.5 mg subcutaneously   No current facility-administered medications on file prior to visit.   Family History  Problem Relation Age of Onset  Obesity Mother  High blood pressure (Hypertension) Mother  Hyperlipidemia (Elevated cholesterol) Mother  Skin cancer Father  Obesity Father  High blood pressure (Hypertension) Father  Hyperlipidemia (Elevated cholesterol) Father  Coronary Artery Disease (Blocked arteries around heart) Father  Diabetes Father  Obesity Sister  High blood pressure (Hypertension) Sister  Deep vein thrombosis (DVT or abnormal blood clot formation) Sister  Obesity Brother    Social History   Tobacco Use  Smoking Status Never  Smokeless Tobacco Never    Social  History   Socioeconomic History  Marital status: Married  Tobacco Use  Smoking status: Never  Smokeless tobacco: Never  Vaping Use  Vaping status: Never Used  Substance and Sexual Activity  Alcohol use:  Not Currently  Drug use: Never   Social Drivers of Health   Financial Resource Strain: Low Risk (01/22/2022)  Received from [facility]  Overall Financial Resource Strain (CARDIA)  Difficulty of Paying Living Expenses: Not hard at all  Food Insecurity: No Food Insecurity (05/20/2023)  Received from [facility]  Hunger Vital Sign  Worried About Running Out of Food in the Last Year: Never true  Ran Out of Food in the Last Year: Never true  Transportation Needs: No Transportation Needs (05/20/2023)  Received from [facility] - Transportation  Lack of Transportation (Medical): No  Lack of Transportation (Non-Medical): No  Physical Activity: Inactive (01/22/2022)  Received from [facility]  Exercise Vital Sign  Days of Exercise per Week: 0 days  Minutes of Exercise per Session: 0 min  Stress: No Stress Concern Present (01/22/2022)  Received from [facility] - Occupational Stress Questionnaire  Feeling of Stress : Not at all  Social Connections: Moderately Isolated (01/22/2022)  Received from [facility]  Social Connection and Isolation Panel [NHANES]  Frequency of Communication with Friends and Family: More than three times a week  Frequency of Social Gatherings with Friends and Family: Once a week  Attends Religious Services: Never  Database Administrator or Organizations: No  Attends Banker Meetings: Never  Marital Status: Married  Housing Stability: Unknown (09/23/2023)  Housing Stability Vital Sign  Homeless in the Last Year: No   Objective:   Vitals:  09/23/23 1105  BP: 132/84  Pulse: 88  Temp: 36.8 C (98.3 F)  SpO2: 98%  Weight: (!) 125.5 kg (276 lb 9.6 oz)  Height: 170.2 cm (5' 7)  PainSc: 0-No pain   Body mass index is 43.32 kg/m.  Gen: A&Ox3, no distress  Chest: respiratory effort is normal.  Cardiovascular: RRR  Gastrointestinal: Obese, soft, nondistended, nontender; he has a broad midline  diastases at the inferior aspect of which is a vertical incision just above the umbilicus; there is a chronically incarcerated fat-containing hernia at the level of the umbilicus which is mildly tender. Fascial defect is not palpable. Muscoloskeletal: no clubbing or cyanosis of the fingers. Strength is symmetrical throughout. Range of motion of bilateral upper and lower extremities normal without pain, crepitation or contracture. Neuro: cranial nerves grossly intact. Sensation intact to light touch diffusely. Psych: appropriate mood and affect, normal insight/judgment intact  Skin: warm and dry  Assessment and Plan:  Diagnoses and all orders for this visit:  Recurrent ventral incisional hernia  Chronically incarcerated and fat-containing at this point. I recommend a laparoscopic repair and went over the technique of the procedure, use of mesh, and risks including bleeding, infection, pain, scarring, injury to intra-abdominal structures, conversion to open surgery, poor wound healing or undesired cosmetic result, hematoma/seroma, mesh infection, adhesions, bowel obstruction, ileus, and hernia recurrence which we discussed is typically about an 8 to 10% risk, discussed that his risk recurrence or other complications is elevated by habitus. Questions welcomed and answered to his satisfaction. He wishes to proceed with surgery.  Deitrich Steve ALAN FREUND, MD

## 2023-10-06 NOTE — Anesthesia Procedure Notes (Signed)
 Procedure Name: Intubation Date/Time: 10/06/2023 9:48 AM  Performed by: Carleton Garnette SAUNDERS, CRNAPre-anesthesia Checklist: Patient identified, Suction available, Emergency Drugs available, Patient being monitored and Timeout performed Patient Re-evaluated:Patient Re-evaluated prior to induction Oxygen Delivery Method: Circle system utilized Preoxygenation: Pre-oxygenation with 100% oxygen Induction Type: IV induction Ventilation: Mask ventilation without difficulty and Oral airway inserted - appropriate to patient size Laryngoscope Size: Mac and 4 Grade View: Grade I Tube type: Oral Tube size: 7.5 mm Number of attempts: 1 Airway Equipment and Method: Stylet Placement Confirmation: ETT inserted through vocal cords under direct vision, positive ETCO2 and breath sounds checked- equal and bilateral Secured at: 23 cm Tube secured with: Tape Dental Injury: Teeth and Oropharynx as per pre-operative assessment

## 2023-10-06 NOTE — Op Note (Signed)
 Operative Note  Darren Black  [id number]  [id number]  10/06/2023   Surgeon: Mitzie Freund MD FACS   Procedure performed: Laparoscopic repair of recurrent ventral incisional hernia 5x3cm, chronically incarcerated containing omentum   Preop diagnosis: Recurrent ventral incisional hernia repair, chronically incarcerated.  Defect size not known prior to surgery/not palpable Post-op diagnosis/intraop findings: Same, fascial defect measured 5 cm x 3 cm after dissection   Specimens: no Retained items: no  EBL: Minimal cc Complications: none   Description of procedure: After obtaining informed consent the patient was taken to the operating room and placed supine on operating room table where general endotracheal anesthesia was initiated, preoperative antibiotics were administered, SCDs applied, and a formal timeout was performed.  The abdomen was clipped, prepped and draped in usual sterile fashion.  Peritoneal access was gained with an optical entry in the left upper quadrant followed by uneventful insufflation to 15 mmHg.  There was no injury from our entry.  2 additional 5 mm trocars were placed on the left hemiabdomen under direct visualization after infiltration with local (Exparel  mixed with quarter percent Marcaine  with epinephrine ).  There were omental adhesions to the abdominal wall which were taken down with combination of blunt dissection and harmonic scalpel.  This revealed a periumbilical ventral incisional hernia.  The median umbilical ligament was taken down several centimeters inferiorly as was the falciform ligament superiorly to provide a landing zone for the mesh.  At this point an incision was made over the hernia and the hernia sac dissected free of the surrounding soft tissue.  This was skeletonized levels of fascia where it was excised.  The fascial defect was cleared off circumferentially and measured 5 cm cephalocaudad by 3 cm transverse.  A 6 inch x 8 inch ventral light mesh was  selected.  Stay sutures of 0 Ethibond were placed at the 4 cardinal directions and this was introduced into the abdominal cavity.  The fascial defect was then closed with interrupted figure-of-eight 0 Novafil's.  The abdomen was then reinsufflated and the fascial closure inspected and confirmed to be airtight and free of any entrapped structures.  The previously inserted mesh was unfurled and the stay sutures brought through the abdominal wall with a laparoscopic suture passer for transfascial fixation in the 4 cardinal directions.  The coated side was confirmed to be facing the viscera and the rough/uncoated side facing the abdominal wall.  The secure strap tacker was then used to further secure the mesh to the abdominal wall with a circumferential outer crown as well as an inner crown of tacks.  On completion the mesh is flush against the abdominal wall and there is no exposed rough surface.  The abdominal cavity was inspected and hemostasis confirmed.  Omentum was brought down over the bowel.  Bilateral laparoscopic assisted tap blocks were performed with the remaining local.  The abdomen was then desufflated and trocars removed.  The midline incision was closed with interrupted 3-0 Vicryl and the deeper soft tissue to close down dead space.  The umbilical skin was sutured down with another 3-0 Vicryl.  All skin incisions were then closed with subcuticular 4 Monocryl followed by benzoin, Steri-Strips, and sterile dressings.  The patient was then awakened, extubated and taken to PACU in stable condition.    All counts were correct at the completion of the case.

## 2023-10-07 ENCOUNTER — Encounter (HOSPITAL_COMMUNITY): Payer: Self-pay | Admitting: Surgery

## 2023-10-26 ENCOUNTER — Encounter: Payer: Medicare HMO | Attending: Registered Nurse | Admitting: Registered Nurse

## 2023-10-26 ENCOUNTER — Other Ambulatory Visit: Payer: Self-pay | Admitting: Internal Medicine

## 2023-10-26 ENCOUNTER — Encounter: Payer: Self-pay | Admitting: Registered Nurse

## 2023-10-26 VITALS — BP 129/85 | HR 85 | Ht 67.0 in | Wt 281.0 lb

## 2023-10-26 DIAGNOSIS — M542 Cervicalgia: Secondary | ICD-10-CM | POA: Insufficient documentation

## 2023-10-26 DIAGNOSIS — G894 Chronic pain syndrome: Secondary | ICD-10-CM | POA: Insufficient documentation

## 2023-10-26 DIAGNOSIS — G8929 Other chronic pain: Secondary | ICD-10-CM

## 2023-10-26 DIAGNOSIS — M25562 Pain in left knee: Secondary | ICD-10-CM | POA: Insufficient documentation

## 2023-10-26 DIAGNOSIS — Z5181 Encounter for therapeutic drug level monitoring: Secondary | ICD-10-CM | POA: Insufficient documentation

## 2023-10-26 DIAGNOSIS — Y92009 Unspecified place in unspecified non-institutional (private) residence as the place of occurrence of the external cause: Secondary | ICD-10-CM | POA: Diagnosis not present

## 2023-10-26 DIAGNOSIS — M545 Low back pain, unspecified: Secondary | ICD-10-CM | POA: Insufficient documentation

## 2023-10-26 DIAGNOSIS — W19XXXD Unspecified fall, subsequent encounter: Secondary | ICD-10-CM | POA: Insufficient documentation

## 2023-10-26 DIAGNOSIS — Z79891 Long term (current) use of opiate analgesic: Secondary | ICD-10-CM | POA: Insufficient documentation

## 2023-10-26 MED ORDER — OXYCODONE-ACETAMINOPHEN 5-325 MG PO TABS: 1.0000 | ORAL_TABLET | Freq: Three times a day (TID) | ORAL | 0 refills | Status: DC | PRN

## 2023-10-26 NOTE — Progress Notes (Unsigned)
 Subjective:    Patient ID: Darren Black, male    DOB: 01-29-1966, 58 y.o.   MRN: [id number]  HPI: Darren Black is a 58 y.o. male who returns for follow up appointment for chronic pain and medication refill. He states his pain is located in his neck, lower back and left knee pain S/P  fall three weeks ago. He reports on 10/07/2023, he was walking in his home and stumbled and fell forward and landed on left knee pain. His wife helped him up, he didn't seek medical attention. Educated on falls prevention, he verbalizes understanding.   He rates his pain 9., he reports his current medication regimen offers no relief to his pain, his medication frequency will be changed, he verbalizes understanding. He was instructed to send a My-Chart message with update on his medication change, he verbalizes understanding.  His current exercise regime is walking and performing stretching exercises.  Darren Black Morphine equivalent is 22.50 MME. He is also prescribed Lorazepam  by Dr. Jonny Ruiz .We have discussed the black box warning of using opioids and benzodiazepines. I highlighted the dangers of using these drugs together and discussed the adverse events including respiratory suppression, overdose, cognitive impairment and importance of compliance with current regimen. We will continue to monitor and adjust as indicated.   Last UDS was Performed on 08/17/2024, it was consistent.    Pain Inventory Average Pain 9 Pain Right Now 9 My pain is constant, dull, stabbing, and tingling  In the last 24 hours, has pain interfered with the following? General activity 10 Relation with others 8 Enjoyment of life 9 What TIME of day is your pain at its worst? morning , daytime, evening, and night Sleep (in general) Poor  Pain is worse with: walking, bending, sitting, inactivity, standing, and some activites Pain improves with: medication Relief from Meds: 1  Family History  Problem Relation Age of Onset   Coronary  artery disease Father    Cancer Father        anaplastic thyroid cancer   Stroke Other        1st degree relative   Diabetes Other        1st degree relative   Hypertension Other    Hyperlipidemia Other    Social History   Socioeconomic History   Marital status: Married    Spouse name: Not on file   Number of children: Not on file   Years of education: Not on file   Highest education level: Not on file  Occupational History   Occupation: disabled, LBP and anxiety  Tobacco Use   Smoking status: Never   Smokeless tobacco: Never  Vaping Use   Vaping status: Never Used  Substance and Sexual Activity   Alcohol use: Not Currently   Drug use: No   Sexual activity: Not on file  Other Topics Concern   Not on file  Social History Narrative   Not on file   Social Drivers of Health   Financial Resource Strain: Low Risk  (01/22/2022)   Overall Financial Resource Strain (CARDIA)    Difficulty of Paying Living Expenses: Not hard at all  Food Insecurity: No Food Insecurity (05/20/2023)   Hunger Vital Sign    Worried About Running Out of Food in the Last Year: Never true    Ran Out of Food in the Last Year: Never true  Transportation Needs: No Transportation Needs (05/20/2023)   PRAPARE - Administrator, Civil Service (Medical): No  Lack of Transportation (Non-Medical): No  Physical Activity: Inactive (01/22/2022)   Exercise Vital Sign    Days of Exercise per Week: 0 days    Minutes of Exercise per Session: 0 min  Stress: No Stress Concern Present (01/22/2022)   [facility] Black [facility] - Occupational Stress Questionnaire    Feeling of Stress : Not at all  Social Connections: Moderately Isolated (01/22/2022)   Social Connection and Isolation Panel [NHANES]    Frequency of Communication with Friends and Family: More than three times a week    Frequency of Social Gatherings with Friends and Family: Once a week    Attends Religious Services: Never     Database administrator or Organizations: No    Attends Engineer, structural: Never    Marital Status: Married   Past Surgical History:  Procedure Laterality Date   APPENDECTOMY  2003   BREATH Nelda Bucks  04/27/2012   Procedure: BREATH TEK H PYLORI;  Surgeon: Valarie Merino, MD;  Location: [facility];  Service: General;  Laterality: N/A;   CHOLECYSTECTOMY N/A 03/24/2019   Procedure: LAPAROSCOPIC CHOLECYSTECTOMY WITH INTRAOPERATIVE CHOLANGIOGRAM;  Surgeon: Manus Rudd, MD;  Location: [facility];  Service: General;  Laterality: N/A;   CYSTOSCOPY W/ URETERAL STENT PLACEMENT Left 05/13/2023   Procedure: CYSTOSCOPY WITH RETROGRADE PYELOGRAM/URETERAL STENT PLACEMENT AND INDOCYANINE GREEN DYE INJECTION;  Surgeon: Loletta Parish., MD;  Location: [facility];  Service: Urology;  Laterality: Left;   CYSTOSCOPY WITH URETHRAL DILATATION N/A 04/12/2015   Procedure: CYSTOSCOPY WITH URETHRAL DILATATION, RETROGRADE AND URETHROGRAM WITH BLADDER BIOPSY;  Surgeon: Bjorn Pippin, MD;  Location: [facility];  Service: Urology;  Laterality: N/A;   ROBOTIC ASSISTED LAPAROSCOPIC BLADDER DIVERTICULECTOMY N/A 05/13/2023   Procedure: XI ROBOTIC ASSISTED LAPAROSCOPIC PARTIAL CYSTECTOMY, OPNE UMBILICAL HERNIA REPAIR;  Surgeon: Loletta Parish., MD;  Location: [facility];  Service: Urology;  Laterality: N/A;   ROTATOR CUFF REPAIR Right 01-21-2012   TRANSTHORACIC ECHOCARDIOGRAM  03-26-2007   normal LVSF, ef 60%,  mild AV calcification without stenosis,  mild MR and TR,  mild LAE   URETHROGRAM N/A 04/12/2015   Procedure: URETHROGRAM;  Surgeon: Bjorn Pippin, MD;  Location: [facility];  Service: Urology;  Laterality: N/A;   VENTRAL HERNIA REPAIR N/A 10/06/2023   Procedure: LAPAROSCOPIC VENTRAL HERNIA repair with mesh;  Surgeon: Berna Bue, MD;  Location: [facility];  Service: General;  Laterality: N/A;   Past Surgical History:  Procedure Laterality Date   APPENDECTOMY  2003   BREATH TEK  H PYLORI  04/27/2012   Procedure: BREATH TEK H PYLORI;  Surgeon: Valarie Merino, MD;  Location: [facility];  Service: General;  Laterality: N/A;   CHOLECYSTECTOMY N/A 03/24/2019   Procedure: LAPAROSCOPIC CHOLECYSTECTOMY WITH INTRAOPERATIVE CHOLANGIOGRAM;  Surgeon: Manus Rudd, MD;  Location: [facility];  Service: General;  Laterality: N/A;   CYSTOSCOPY W/ URETERAL STENT PLACEMENT Left 05/13/2023   Procedure: CYSTOSCOPY WITH RETROGRADE PYELOGRAM/URETERAL STENT PLACEMENT AND INDOCYANINE GREEN DYE INJECTION;  Surgeon: Loletta Parish., MD;  Location: [facility];  Service: Urology;  Laterality: Left;   CYSTOSCOPY WITH URETHRAL DILATATION N/A 04/12/2015   Procedure: CYSTOSCOPY WITH URETHRAL DILATATION, RETROGRADE AND URETHROGRAM WITH BLADDER BIOPSY;  Surgeon: Bjorn Pippin, MD;  Location[facility];  Service: Urology;  Laterality: N/A;   ROBOTIC ASSISTED LAPAROSCOPIC BLADDER DIVERTICULECTOMY N/A 05/13/2023   Procedure: XI ROBOTIC ASSISTED LAPAROSCOPIC PARTIAL CYSTECTOMY, OPNE UMBILICAL HERNIA REPAIR;  Surgeon: Gypsy Balsam  Montez Hageman., MD;  Location: [facility];  Service: Urology;  Laterality: N/A;   ROTATOR CUFF REPAIR Right 01-21-2012   TRANSTHORACIC ECHOCARDIOGRAM  03-26-2007   normal LVSF, ef 60%,  mild AV calcification without stenosis,  mild MR and TR,  mild LAE   URETHROGRAM N/A 04/12/2015   Procedure: URETHROGRAM;  Surgeon: Bjorn Pippin, MD;  Location: [facility];  Service: Urology;  Laterality: N/A;   VENTRAL HERNIA REPAIR N/A 10/06/2023   Procedure: LAPAROSCOPIC VENTRAL HERNIA repair with mesh;  Surgeon: Berna Bue, MD;  Location: [facility];  Service: General;  Laterality: N/A;   Past Medical History:  Diagnosis Date   Anxiety    BPH (benign prostatic hypertrophy)    Chronic lower back pain    nerve damage w/ leg pain   Depression    Diarrhea 03/23/2019   Family history of adverse reaction to anesthesia    MOTHER--- HARD TO WAKE   GERD (gastroesophageal reflux  disease)    History of panic attacks    Hyperlipidemia    Hypertension    Meatal stenosis    OSA on CPAP    moderate to severe OSA  per study 04-28-2007 uses CPAP   PONV (postoperative nausea and vomiting)    Hard to wake up   There were no vitals taken for this visit.  Opioid Risk Score:   Fall Risk Score:  `1  Depression screen [facility] 2/9     08/18/2023   11:27 AM 07/09/2023    1:12 PM 06/22/2023    1:04 PM 01/12/2023    3:30 PM 09/22/2022    1:55 PM 07/14/2022    3:01 PM 06/20/2022   11:34 AM  Depression screen PHQ 2/9  Decreased Interest 0 0 1 0 0 0 3  Down, Depressed, Hopeless 0 0 1 0 0 0 3  PHQ - 2 Score 0 0 2 0 0 0 6  Altered sleeping      0   Tired, decreased energy      0   Change in appetite      0   Feeling bad or failure about yourself       0   Trouble concentrating      0   Moving slowly or fidgety/restless      0   Suicidal thoughts      0   PHQ-9 Score      0   Difficult doing work/chores      Not difficult at all     Review of Systems  Gastrointestinal:  Positive for abdominal pain.  Musculoskeletal:  Positive for back pain and neck pain.       Right hip pain Pain in both knees  All other systems reviewed and are negative.     Objective:   Physical Exam Vitals and nursing note reviewed.  Constitutional:      Appearance: Normal appearance. He is obese.  Neck:     Comments: Cervical Paraspinal Tenderness: C-4-C-6  Cardiovascular:     Rate and Rhythm: Normal rate and regular rhythm.     Pulses: Normal pulses.     Heart sounds: Normal heart sounds.  Pulmonary:     Effort: Pulmonary effort is normal.     Breath sounds: Normal breath sounds.  Musculoskeletal:     Comments: Normal Muscle Bulk and Muscle Testing Reveals:  Upper Extremities: Full ROM and Muscle Strength 5/5 Lumbar Paraspinal Tenderness: L-4-L-5 Lower Extremities: Full ROM and Muscle Strength 5/5 Arises from  Table slowly Narrow Based  Gait     Skin:    General: Skin is warm and  dry.  Neurological:     Mental Status: He is alert and oriented to person, place, and time.  Psychiatric:        Mood and Affect: Mood normal.        Behavior: Behavior normal.         Assessment & Plan:  Lumbar Radiculitis: Continue HEP as Tolerated. Continue current medication regimen. Continue to Monitor. 10/26/2023 Chronic Bilateral Thoracic Pain: No complaints today. Continue HEP as Tolerated. Continue to Monitor. 10/26/2023 Depression/ Anxiety: PCP Following. Continue current Medication Regimen. Continue to Monitor. 10/26/2023 Chronic Pain Syndrome: Refilled: Oxycodone 5/325 mg one tablet every 8 hours as needed for pain #80..We will continue the opioid monitoring program, this consists of regular clinic visits, examinations, urine drug screen, pill counts as well as use of [place] Controlled Substance Reporting system. A 12 month History has been reviewed on the [place] Controlled Substance Reporting System on 10/26/2023.     F/U in 2  months.

## 2023-10-28 DIAGNOSIS — Z09 Encounter for follow-up examination after completed treatment for conditions other than malignant neoplasm: Secondary | ICD-10-CM | POA: Diagnosis not present

## 2023-10-30 DIAGNOSIS — R972 Elevated prostate specific antigen [PSA]: Secondary | ICD-10-CM | POA: Diagnosis not present

## 2023-10-30 DIAGNOSIS — Z79899 Other long term (current) drug therapy: Secondary | ICD-10-CM | POA: Diagnosis not present

## 2023-10-30 DIAGNOSIS — E291 Testicular hypofunction: Secondary | ICD-10-CM | POA: Diagnosis not present

## 2023-10-30 DIAGNOSIS — E079 Disorder of thyroid, unspecified: Secondary | ICD-10-CM | POA: Diagnosis not present

## 2023-10-30 DIAGNOSIS — E8881 Metabolic syndrome: Secondary | ICD-10-CM | POA: Diagnosis not present

## 2023-10-30 DIAGNOSIS — E349 Endocrine disorder, unspecified: Secondary | ICD-10-CM | POA: Diagnosis not present

## 2023-11-01 DIAGNOSIS — G4733 Obstructive sleep apnea (adult) (pediatric): Secondary | ICD-10-CM | POA: Diagnosis not present

## 2023-11-12 DIAGNOSIS — E291 Testicular hypofunction: Secondary | ICD-10-CM | POA: Diagnosis not present

## 2023-11-16 ENCOUNTER — Telehealth: Payer: Self-pay | Admitting: Physical Medicine and Rehabilitation

## 2023-11-16 MED ORDER — OXYCODONE-ACETAMINOPHEN 5-325 MG PO TABS: 1.0000 | ORAL_TABLET | Freq: Three times a day (TID) | ORAL | 0 refills | Status: DC | PRN

## 2023-11-16 NOTE — Telephone Encounter (Signed)
 Patient requesting refill on Ocyocodone please send to [facility] on [address]

## 2023-12-08 ENCOUNTER — Other Ambulatory Visit: Payer: Self-pay

## 2023-12-08 ENCOUNTER — Other Ambulatory Visit: Payer: Self-pay | Admitting: Internal Medicine

## 2023-12-16 ENCOUNTER — Ambulatory Visit: Payer: Medicare HMO | Admitting: Physical Medicine and Rehabilitation

## 2023-12-17 ENCOUNTER — Other Ambulatory Visit: Payer: Self-pay | Admitting: Internal Medicine

## 2023-12-17 ENCOUNTER — Other Ambulatory Visit: Payer: Self-pay

## 2023-12-23 ENCOUNTER — Encounter: Payer: Self-pay | Admitting: Physical Medicine and Rehabilitation

## 2023-12-23 ENCOUNTER — Encounter: Payer: Medicare HMO | Attending: Registered Nurse | Admitting: Physical Medicine and Rehabilitation

## 2023-12-23 VITALS — BP 134/76 | HR 53 | Ht 67.0 in | Wt 283.2 lb

## 2023-12-23 DIAGNOSIS — Z5181 Encounter for therapeutic drug level monitoring: Secondary | ICD-10-CM | POA: Diagnosis not present

## 2023-12-23 DIAGNOSIS — Z79891 Long term (current) use of opiate analgesic: Secondary | ICD-10-CM | POA: Diagnosis not present

## 2023-12-23 DIAGNOSIS — M4800 Spinal stenosis, site unspecified: Secondary | ICD-10-CM | POA: Diagnosis not present

## 2023-12-23 DIAGNOSIS — G894 Chronic pain syndrome: Secondary | ICD-10-CM | POA: Diagnosis not present

## 2023-12-23 MED ORDER — OXYCODONE-ACETAMINOPHEN 5-325 MG PO TABS: 1.0000 | ORAL_TABLET | Freq: Three times a day (TID) | ORAL | 0 refills | Status: DC | PRN

## 2023-12-23 MED ORDER — PREDNISONE 20 MG PO TABS: 20.0000 mg | ORAL_TABLET | Freq: Every day | ORAL | 0 refills | Status: AC

## 2023-12-23 MED ORDER — DIAZEPAM 5 MG PO TABS: 5.0000 mg | ORAL_TABLET | Freq: Two times a day (BID) | ORAL | 0 refills | Status: DC

## 2023-12-23 NOTE — Patient Instructions (Signed)
 Pt is a 58 yr old male with hx of R shoulder pain due to complete supraspinatus tear HTN; no DM; HLD, BPH, on Flomax  and Prazosin; and allergies and low testosterone , depression/anxiety on Lorazepam /Cymbalta ; ;  Here for f/u of chronic low back pain due to lumbar radiculopathy  Oral drug screen since on chronic pain meds- due per clinic policy- last few tests have been consistent with his meds.   2. Will try Prednisone -  20 mg daily x 5 days- since so out of control pain- will send in to his pharmacy- take in AM.    3. Cannot take Valium  AND Lorazepam  at the same time-  Take off 5 days of Lorazepam  why on Valium   4. Will increase his Percocet to 10/325 mg #90 pills-  from 80 pills.    5. F/U in 4 months- 2 months with Emilia Harbour

## 2023-12-23 NOTE — Progress Notes (Signed)
 Subjective:    Patient ID: Darren Black, male    DOB: [birth year], 58 y.o.   MRN: [id number]  HPI  Pt is a 58 yr old male with hx of R shoulder pain due to complete supraspinatus tear HTN; no DM; HLD, BPH, on Flomax  and Prazosin; and allergies and low testosterone , depression/anxiety on Lorazepam /Cymbalta ; ;  Here for f/u of chronic low back pain due to lumbar radiculopathy.    Had a hernia repair- and got up too soon and stumbled and tripped over runner in house.     [name] Percocet was increased to 80 tabs/month at last visit- because he had a fall  early February and had neck, low back and L knee pain- pain meds didn't feel like they were enough at that time.   In so much pain today- just came back  by plane- came back 12/05/23- got rerouted - was 12 hours behind. And so sat in airport for 12 hours     Fingers on  R hand were going numb.  Just the tips- but would come back when hung arm down.   Having a lot of burning feeling in legs- down to the knee.  Also rates pain is >10/10- usually at 9/10- but says pain si SO much worse today.     Is on Duloxetine - 60 mg daily. From PCP.    Valium  and Steroids last fall were very helpful for pain.  Wants to try that again.       Pain Inventory Average Pain 9 Pain Right Now 10 My pain is constant, sharp, burning, dull, stabbing, tingling, and aching  In the last 24 hours, has pain interfered with the following? General activity 10 Relation with others 9 Enjoyment of life 9 What TIME of day is your pain at its worst? morning , daytime, evening, and night Sleep (in general) Poor  Pain is worse with: walking, bending, sitting, inactivity, standing, and some activites Pain improves with: medication Relief from Meds: 1  Family History  Problem Relation Age of Onset   Coronary artery disease Father    Cancer Father        anaplastic thyroid  cancer   Stroke Other        1st degree relative   Diabetes Other        1st  degree relative   Hypertension Other    Hyperlipidemia Other    Social History   Socioeconomic History   Marital status: Married    Spouse name: Not on file   Number of children: Not on file   Years of education: Not on file   Highest education level: Not on file  Occupational History   Occupation: disabled, LBP and anxiety  Tobacco Use   Smoking status: Never   Smokeless tobacco: Never  Vaping Use   Vaping status: Never Used  Substance and Sexual Activity   Alcohol use: Not Currently   Drug use: No   Sexual activity: Not on file  Other Topics Concern   Not on file  Social History Narrative   Not on file   Social Drivers of Health   Financial Resource Strain: Low Risk  (01/22/2022)   Overall Financial Resource Strain (CARDIA)    Difficulty of Paying Living Expenses: Not hard at all  Food Insecurity: No Food Insecurity (05/20/2023)   Hunger Vital Sign    Worried About Running Out of Food in the Last Year: Never true    Ran Out of Food in the Last  Year: Never true  Transportation Needs: No Transportation Needs (05/20/2023)   PRAPARE - Administrator, Civil Service (Medical): No    Lack of Transportation (Non-Medical): No  Physical Activity: Inactive (01/22/2022)   Exercise Vital Sign    Days of Exercise per Week: 0 days    Minutes of Exercise per Session: 0 min  Stress: No Stress Concern Present (01/22/2022)   [employer] of Occupational Health - Occupational Stress Questionnaire    Feeling of Stress : Not at all  Social Connections: Moderately Isolated (01/22/2022)   Social Connection and Isolation Panel [NHANES]    Frequency of Communication with Friends and Family: More than three times a week    Frequency of Social Gatherings with Friends and Family: Once a week    Attends Religious Services: Never    Database administrator or Organizations: No    Attends Engineer, structural: Never    Marital Status: Married   Past Surgical History:   Procedure Laterality Date   APPENDECTOMY  2003   BREATH Sonjia Durie  04/27/2012   Procedure: BREATH TEK H PYLORI;  Surgeon: Azucena Bollard, MD;  Location: [facility];  Service: General;  Laterality: N/A;   CHOLECYSTECTOMY N/A 03/24/2019   Procedure: LAPAROSCOPIC CHOLECYSTECTOMY WITH INTRAOPERATIVE CHOLANGIOGRAM;  Surgeon: Dareen Ebbing, MD;  Location: [facility];  Service: General;  Laterality: N/A;   CYSTOSCOPY W/ URETERAL STENT PLACEMENT Left 05/13/2023   Procedure: CYSTOSCOPY WITH RETROGRADE PYELOGRAM/URETERAL STENT PLACEMENT AND INDOCYANINE GREEN DYE INJECTION;  Surgeon: Melody Spurling., MD;  Location: [facility];  Service: Urology;  Laterality: Left;   CYSTOSCOPY WITH URETHRAL DILATATION N/A 04/12/2015   Procedure: CYSTOSCOPY WITH URETHRAL DILATATION, RETROGRADE AND URETHROGRAM WITH BLADDER BIOPSY;  Surgeon: Homero Luster, MD;  Location: [facility];  Service: Urology;  Laterality: N/A;   ROBOTIC ASSISTED LAPAROSCOPIC BLADDER DIVERTICULECTOMY N/A 05/13/2023   Procedure: XI ROBOTIC ASSISTED LAPAROSCOPIC PARTIAL CYSTECTOMY, OPNE UMBILICAL HERNIA REPAIR;  Surgeon: Melody Spurling., MD;  Location: [facility];  Service: Urology;  Laterality: N/A;   ROTATOR CUFF REPAIR Right 01-21-2012   TRANSTHORACIC ECHOCARDIOGRAM  03-26-2007   normal LVSF, ef 60%,  mild AV calcification without stenosis,  mild MR and TR,  mild LAE   URETHROGRAM N/A 04/12/2015   Procedure: URETHROGRAM;  Surgeon: Homero Luster, MD;  Location: [facility];  Service: Urology;  Laterality: N/A;   VENTRAL HERNIA REPAIR N/A 10/06/2023   Procedure: LAPAROSCOPIC VENTRAL HERNIA repair with mesh;  Surgeon: Adalberto Acton, MD;  Location: [facility];  Service: General;  Laterality: N/A;   Past Surgical History:  Procedure Laterality Date   APPENDECTOMY  2003   BREATH TEK H PYLORI  04/27/2012   Procedure: BREATH TEK H PYLORI;  Surgeon: Azucena Bollard, MD;  Location: [facility];  Service: General;  Laterality: N/A;    CHOLECYSTECTOMY N/A 03/24/2019   Procedure: LAPAROSCOPIC CHOLECYSTECTOMY WITH INTRAOPERATIVE CHOLANGIOGRAM;  Surgeon: Dareen Ebbing, MD;  Location[facility];  Service: General;  Laterality: N/A;   CYSTOSCOPY W/ URETERAL STENT PLACEMENT Left 05/13/2023   Procedure: CYSTOSCOPY WITH RETROGRADE PYELOGRAM/URETERAL STENT PLACEMENT AND INDOCYANINE GREEN DYE INJECTION;  Surgeon: Melody Spurling., MD;  Location: [facility];  Service: Urology;  Laterality: Left;   CYSTOSCOPY WITH URETHRAL DILATATION N/A 04/12/2015   Procedure: CYSTOSCOPY WITH URETHRAL DILATATION, RETROGRADE AND URETHROGRAM WITH BLADDER BIOPSY;  Surgeon: Homero Luster, MD;  Location: [facility];  Service: Urology;  Laterality: N/A;  ROBOTIC ASSISTED LAPAROSCOPIC BLADDER DIVERTICULECTOMY N/A 05/13/2023   Procedure: XI ROBOTIC ASSISTED LAPAROSCOPIC PARTIAL CYSTECTOMY, OPNE UMBILICAL HERNIA REPAIR;  Surgeon: Melody Spurling., MD;  [facility];  Service: Urology;  Laterality: N/A;   ROTATOR CUFF REPAIR Right 01-21-2012   TRANSTHORACIC ECHOCARDIOGRAM  03-26-2007   normal LVSF, ef 60%,  mild AV calcification without stenosis,  mild MR and TR,  mild LAE   URETHROGRAM N/A 04/12/2015   Procedure: URETHROGRAM;  Surgeon: Homero Luster, MD;  Location: [facility];  Service: Urology;  Laterality: N/A;   VENTRAL HERNIA REPAIR N/A 10/06/2023   Procedure: LAPAROSCOPIC VENTRAL HERNIA repair with mesh;  Surgeon: Adalberto Acton, MD;  Location: [facility];  Service: General;  Laterality: N/A;   Past Medical History:  Diagnosis Date   Anxiety    BPH (benign prostatic hypertrophy)    Chronic lower back pain    nerve damage w/ leg pain   Depression    Diarrhea 03/23/2019   Family history of adverse reaction to anesthesia    MOTHER--- HARD TO WAKE   GERD (gastroesophageal reflux disease)    History of panic attacks    Hyperlipidemia    Hypertension    Meatal stenosis    OSA on CPAP    moderate to severe OSA  per study  04-28-2007 uses CPAP   PONV (postoperative nausea and vomiting)    Hard to wake up   BP 134/76   Pulse (!) 53   Ht 5\' 7"  (1.702 m)   Wt 283 lb 3.2 oz (128.5 kg)   SpO2 96%   BMI 44.36 kg/m   Opioid Risk Score:   Fall Risk Score:  `1  Depression screen [facility] 2/9     12/23/2023   11:27 AM 10/26/2023    1:39 PM 08/18/2023   11:27 AM 07/09/2023    1:12 PM 06/22/2023    1:04 PM 01/12/2023    3:30 PM 09/22/2022    1:55 PM  Depression screen PHQ 2/9  Decreased Interest 0 1 0 0 1 0 0  Down, Depressed, Hopeless 0 1 0 0 1 0 0  PHQ - 2 Score 0 2 0 0 2 0 0     Review of Systems An entire ROS was completed and found to be negative except for HPI    Objective:   Physical Exam  Awake, alert, appropriate, hard to stand up due to pain; and TTP around lumbar paraspinals- with trigger points palpated.        Assessment & Plan:   Pt is a 58 yr old male with hx of R shoulder pain due to complete supraspinatus tear HTN; no DM; HLD, BPH, on Flomax  and Prazosin; and allergies and low testosterone , depression/anxiety on Lorazepam /Cymbalta ; ;  Here for f/u of chronic low back pain due to lumbar radiculopathy  Oral drug screen since on chronic pain meds- due per clinic policy- last few tests have been consistent with his meds.   2. Will try Prednisone -  20 mg daily x 5 days- since so out of control pain- will send in to his pharmacy- take in AM.    3. Cannot take Valium  AND Lorazepam  at the same time-  Take off 5 days of Lorazepam  why on Valium   4. Will increase his Percocet to 10/325 mg #90 pills-  from 80 pills.    5. F/U in 4 months- 2 months with Emilia Harbour chronic pain   6. If this doesn't work, will think about increasing  Cymbalta .    I spent a total of 23   minutes on total care today- >50% coordination of care- due to d/w about making meds changes.

## 2023-12-24 ENCOUNTER — Telehealth: Payer: Self-pay | Admitting: Physical Medicine and Rehabilitation

## 2023-12-24 NOTE — Telephone Encounter (Signed)
 Patient wife is reporting the pharmacy is  needing verification it is ok for patient to get valium  because he is also on Lorazepam  prescribed by Dr Rosalia Colonel.Patient has information on to stop Lorazepam  while on valium  but pharmacy wants a phone call from us  before filling valium .  Pharmacy [facility] [address] [phone]

## 2023-12-25 NOTE — Telephone Encounter (Addendum)
 Per Pharmacist the matter has been resolved. The returned fax from Dr. Lovorn was received today.  The Rx has been filled and ready for pick up.

## 2023-12-26 LAB — DRUG TOX MONITOR 1 W/CONF, ORAL FLD
Alprazolam: NEGATIVE ng/mL (ref <0.50)
Aminoclonazepam: NEGATIVE ng/mL (ref <0.50)
Amphetamines: NEGATIVE ng/mL (ref <10)
Barbiturates: NEGATIVE ng/mL (ref <10)
Benzodiazepines: POSITIVE ng/mL — AB (ref <0.50)
Buprenorphine: NEGATIVE ng/mL (ref <0.10)
Chlordiazepoxide: NEGATIVE ng/mL (ref <0.50)
Clonazepam: NEGATIVE ng/mL (ref <0.50)
Cocaine: NEGATIVE ng/mL (ref <5.0)
Codeine: NEGATIVE ng/mL (ref <2.5)
Diazepam: NEGATIVE ng/mL (ref <0.50)
Dihydrocodeine: NEGATIVE ng/mL (ref <2.5)
Fentanyl: NEGATIVE ng/mL (ref <0.10)
Flunitrazepam: NEGATIVE ng/mL (ref <0.50)
Flurazepam: NEGATIVE ng/mL (ref <0.50)
Heroin Metabolite: NEGATIVE ng/mL (ref <1.0)
Hydrocodone: NEGATIVE ng/mL (ref <2.5)
Hydromorphone: NEGATIVE ng/mL (ref <2.5)
Lorazepam: 0.89 ng/mL — ABNORMAL HIGH (ref <0.50)
MARIJUANA: NEGATIVE ng/mL (ref <2.5)
MDMA: NEGATIVE ng/mL (ref <10)
Meprobamate: NEGATIVE ng/mL (ref <2.5)
Methadone: NEGATIVE ng/mL (ref <5.0)
Midazolam: NEGATIVE ng/mL (ref <0.50)
Morphine: NEGATIVE ng/mL (ref <2.5)
Nicotine Metabolite: NEGATIVE ng/mL (ref <5.0)
Nordiazepam: NEGATIVE ng/mL (ref <0.50)
Norhydrocodone: NEGATIVE ng/mL (ref <2.5)
Noroxycodone: 15.7 ng/mL — ABNORMAL HIGH (ref <2.5)
Opiates: POSITIVE ng/mL — AB (ref <2.5)
Oxazepam: NEGATIVE ng/mL (ref <0.50)
Oxycodone: 83.7 ng/mL — ABNORMAL HIGH (ref <2.5)
Oxymorphone: NEGATIVE ng/mL (ref <2.5)
Phencyclidine: NEGATIVE ng/mL (ref <10)
Tapentadol: NEGATIVE ng/mL (ref <5.0)
Temazepam: NEGATIVE ng/mL (ref <0.50)
Tramadol: NEGATIVE ng/mL (ref <5.0)
Triazolam: NEGATIVE ng/mL (ref <0.50)
Zolpidem: NEGATIVE ng/mL (ref <5.0)

## 2023-12-26 LAB — DRUG TOX ALC METAB W/CON, ORAL FLD: Alcohol Metabolite: NEGATIVE ng/mL (ref <25)

## 2024-01-04 ENCOUNTER — Telehealth: Payer: Self-pay | Admitting: Internal Medicine

## 2024-01-04 ENCOUNTER — Other Ambulatory Visit (INDEPENDENT_AMBULATORY_CARE_PROVIDER_SITE_OTHER)

## 2024-01-04 DIAGNOSIS — E559 Vitamin D deficiency, unspecified: Secondary | ICD-10-CM

## 2024-01-04 DIAGNOSIS — R739 Hyperglycemia, unspecified: Secondary | ICD-10-CM

## 2024-01-04 DIAGNOSIS — E538 Deficiency of other specified B group vitamins: Secondary | ICD-10-CM

## 2024-01-04 DIAGNOSIS — E7800 Pure hypercholesterolemia, unspecified: Secondary | ICD-10-CM

## 2024-01-04 DIAGNOSIS — R972 Elevated prostate specific antigen [PSA]: Secondary | ICD-10-CM | POA: Diagnosis not present

## 2024-01-04 LAB — HEPATIC FUNCTION PANEL
ALT: 42 U/L (ref 0–53)
AST: 25 U/L (ref 0–37)
Albumin: 4.4 g/dL (ref 3.5–5.2)
Alkaline Phosphatase: 49 U/L (ref 39–117)
Bilirubin, Direct: 0.1 mg/dL (ref 0.0–0.3)
Total Bilirubin: 0.7 mg/dL (ref 0.2–1.2)
Total Protein: 6.7 g/dL (ref 6.0–8.3)

## 2024-01-04 LAB — BASIC METABOLIC PANEL WITH GFR
BUN: 15 mg/dL (ref 6–23)
CO2: 26 meq/L (ref 19–32)
Calcium: 9.1 mg/dL (ref 8.4–10.5)
Chloride: 101 meq/L (ref 96–112)
Creatinine, Ser: 1.43 mg/dL (ref 0.40–1.50)
GFR: 54.64 mL/min — ABNORMAL LOW (ref >60.00)
Glucose, Bld: 80 mg/dL (ref 70–99)
Potassium: 4.5 meq/L (ref 3.5–5.1)
Sodium: 140 meq/L (ref 135–145)

## 2024-01-04 LAB — URINALYSIS, ROUTINE W REFLEX MICROSCOPIC
Bilirubin Urine: NEGATIVE
Hgb urine dipstick: NEGATIVE
Ketones, ur: NEGATIVE
Leukocytes,Ua: NEGATIVE
Nitrite: NEGATIVE
RBC / HPF: NONE SEEN (ref 0–?)
Specific Gravity, Urine: <=1.005 — AB (ref 1.000–1.030)
Urine Glucose: NEGATIVE
Urobilinogen, UA: 0.2 (ref 0.0–1.0)
pH: 6 (ref 5.0–8.0)

## 2024-01-04 LAB — MICROALBUMIN / CREATININE URINE RATIO
Creatinine,U: 56.8 mg/dL
Microalb Creat Ratio: 97.7 mg/g — ABNORMAL HIGH (ref 0.0–30.0)
Microalb, Ur: 5.6 mg/dL — ABNORMAL HIGH (ref 0.0–1.9)

## 2024-01-04 LAB — LIPID PANEL
Cholesterol: 145 mg/dL (ref 0–200)
HDL: 40.7 mg/dL (ref >39.00)
LDL Cholesterol: 86 mg/dL (ref 0–99)
NonHDL: 104.54
Total CHOL/HDL Ratio: 4
Triglycerides: 92 mg/dL (ref 0.0–149.0)
VLDL: 18.4 mg/dL (ref 0.0–40.0)

## 2024-01-04 LAB — CBC WITH DIFFERENTIAL/PLATELET
Basophils Absolute: 0.1 10*3/uL (ref 0.0–0.1)
Basophils Relative: 0.8 % (ref 0.0–3.0)
Eosinophils Absolute: 0.2 10*3/uL (ref 0.0–0.7)
Eosinophils Relative: 3.3 % (ref 0.0–5.0)
HCT: 49.9 % (ref 39.0–52.0)
Hemoglobin: 16.4 g/dL (ref 13.0–17.0)
Lymphocytes Relative: 20 % (ref 12.0–46.0)
Lymphs Abs: 1.5 10*3/uL (ref 0.7–4.0)
MCHC: 32.9 g/dL (ref 30.0–36.0)
MCV: 84.5 fl (ref 78.0–100.0)
Monocytes Absolute: 0.7 10*3/uL (ref 0.1–1.0)
Monocytes Relative: 9 % (ref 3.0–12.0)
Neutro Abs: 4.9 10*3/uL (ref 1.4–7.7)
Neutrophils Relative %: 66.9 % (ref 43.0–77.0)
Platelets: 182 10*3/uL (ref 150.0–400.0)
RBC: 5.91 Mil/uL — ABNORMAL HIGH (ref 4.22–5.81)
RDW: 14.9 % (ref 11.5–15.5)
WBC: 7.3 10*3/uL (ref 4.0–10.5)

## 2024-01-04 LAB — HEMOGLOBIN A1C: Hgb A1c MFr Bld: 5.6 % (ref 4.6–6.5)

## 2024-01-04 LAB — VITAMIN D 25 HYDROXY (VIT D DEFICIENCY, FRACTURES): VITD: 58.2 ng/mL (ref 30.00–100.00)

## 2024-01-04 LAB — PSA: PSA: 6.77 ng/mL — ABNORMAL HIGH (ref 0.10–4.00)

## 2024-01-04 LAB — TSH: TSH: 1.71 u[IU]/mL (ref 0.35–5.50)

## 2024-01-04 LAB — VITAMIN B12: Vitamin B-12: 674 pg/mL (ref 211–911)

## 2024-01-04 NOTE — Telephone Encounter (Signed)
 Copied from CRM [id number]. Topic: Clinical - Request for Lab/Test Order >> Jan 04, 2024 10:26 AM Corin V wrote: Reason for CRM: Patient's wife would like to have labs entered so that any blood work will be available for Dr. Autry Legions to review during the appointment on 01/06/24. Please call [place] back to set up labs if Dr. Autry Legions is okay to order labs before appointment.  ---  Orders in system from 07/2023

## 2024-01-06 ENCOUNTER — Encounter: Payer: Self-pay | Admitting: Internal Medicine

## 2024-01-06 ENCOUNTER — Ambulatory Visit: Payer: Medicare HMO | Admitting: Internal Medicine

## 2024-01-06 VITALS — BP 128/80 | HR 80 | Temp 98.5°F | Ht 67.0 in | Wt 278.0 lb

## 2024-01-06 DIAGNOSIS — I10 Essential (primary) hypertension: Secondary | ICD-10-CM | POA: Diagnosis not present

## 2024-01-06 DIAGNOSIS — G8929 Other chronic pain: Secondary | ICD-10-CM | POA: Diagnosis not present

## 2024-01-06 DIAGNOSIS — N1831 Chronic kidney disease, stage 3a: Secondary | ICD-10-CM | POA: Diagnosis not present

## 2024-01-06 DIAGNOSIS — M5441 Lumbago with sciatica, right side: Secondary | ICD-10-CM | POA: Diagnosis not present

## 2024-01-06 DIAGNOSIS — R739 Hyperglycemia, unspecified: Secondary | ICD-10-CM | POA: Diagnosis not present

## 2024-01-06 DIAGNOSIS — R9431 Abnormal electrocardiogram [ECG] [EKG]: Secondary | ICD-10-CM | POA: Diagnosis not present

## 2024-01-06 DIAGNOSIS — E7800 Pure hypercholesterolemia, unspecified: Secondary | ICD-10-CM | POA: Diagnosis not present

## 2024-01-06 DIAGNOSIS — N390 Urinary tract infection, site not specified: Secondary | ICD-10-CM | POA: Diagnosis not present

## 2024-01-06 DIAGNOSIS — E559 Vitamin D deficiency, unspecified: Secondary | ICD-10-CM

## 2024-01-06 DIAGNOSIS — M5442 Lumbago with sciatica, left side: Secondary | ICD-10-CM | POA: Diagnosis not present

## 2024-01-06 DIAGNOSIS — R972 Elevated prostate specific antigen [PSA]: Secondary | ICD-10-CM | POA: Diagnosis not present

## 2024-01-06 DIAGNOSIS — Z0001 Encounter for general adult medical examination with abnormal findings: Secondary | ICD-10-CM

## 2024-01-06 MED ORDER — CIPROFLOXACIN HCL 500 MG PO TABS: 500.0000 mg | ORAL_TABLET | Freq: Two times a day (BID) | ORAL | 0 refills | Status: AC

## 2024-01-06 NOTE — Assessment & Plan Note (Signed)
 Lab Results  Component Value Date   HGBA1C 5.6 01/04/2024   Stable, pt to continue current medical treatment  - diet, wt control

## 2024-01-06 NOTE — Progress Notes (Signed)
 Patient ID: Darren Black, male   DOB: 03-29-1966, 58 y.o.   MRN: [id number]         Chief Complaint:: wellness exam and uti, elevated psa, htn, ckd3a, hyperglycemia, chronic low back pain       HPI:  Darren Black is a 58 y.o. male here for wellness exam; for shingrix and prevnar 20 at pharmacy; pt willing for cardiac CT score; o/w up to date                        Also s/p umbilical hernia surgury feb 2025, doing well now;  Pt denies chest pain, increased sob or doe, wheezing, orthopnea, PND, increased LE swelling, palpitations, dizziness or syncope.   Pt denies polydipsia, polyuria, or new focal neuro s/s.    Pt denies fever, wt loss, night sweats, loss of appetite, or other constitutional symptoms  Pt continues to have recurring LBP without change in severity, bowel or bladder change, fever, wt loss,  worsening LE pain/numbness/weakness, gait change or falls. Denies urinary symptoms such as dysuria, frequency, urgency, flank pain, hematuria or n/v, fever, chills.    Wt Readings from Last 3 Encounters:  01/06/24 278 lb (126.1 kg)  12/23/23 283 lb 3.2 oz (128.5 kg)  10/26/23 281 lb (127.5 kg)   BP Readings from Last 3 Encounters:  01/06/24 128/80  12/23/23 134/76  10/26/23 129/85   Immunization History  Administered Date(s) Administered   Influenza Split 08/28/2011   Influenza, Seasonal, Injecte, Preservative Fre 07/09/2023   Influenza,inj,Quad PF,6+ Mos 07/28/2017, 07/12/2018, 05/05/2019, 07/14/2022   Influenza-Unspecified 07/20/2020   PFIZER(Purple Top)SARS-COV-2 Vaccination 10/13/2019, 11/07/2019, 05/28/2020, 01/14/2021   Tdap 01/21/2017   Health Maintenance Due  Topic Date Due   Pneumococcal Vaccine 69-32 Years old (1 of 2 - PCV) Never done   Zoster Vaccines- Shingrix (1 of 2) Never done   Medicare Annual Wellness (AWV)  01/23/2023      Past Medical History:  Diagnosis Date   Anxiety    BPH (benign prostatic hypertrophy)    Chronic lower back pain    nerve damage w/ leg  pain   Depression    Diarrhea 03/23/2019   Family history of adverse reaction to anesthesia    MOTHER--- HARD TO WAKE   GERD (gastroesophageal reflux disease)    History of panic attacks    Hyperlipidemia    Hypertension    Meatal stenosis    OSA on CPAP    moderate to severe OSA  per study 04-28-2007 uses CPAP   PONV (postoperative nausea and vomiting)    Hard to wake up   Past Surgical History:  Procedure Laterality Date   APPENDECTOMY  2003   BREATH TEK H PYLORI  04/27/2012   Procedure: BREATH TEK H PYLORI;  Surgeon: Azucena Bollard, MD;  Location: [facility];  Service: General;  Laterality: N/A;   CHOLECYSTECTOMY N/A 03/24/2019   Procedure: LAPAROSCOPIC CHOLECYSTECTOMY WITH INTRAOPERATIVE CHOLANGIOGRAM;  Surgeon: Dareen Ebbing, MD;  Location: [facility];  Service: General;  Laterality: N/A;   CYSTOSCOPY W/ URETERAL STENT PLACEMENT Left 05/13/2023   Procedure: CYSTOSCOPY WITH RETROGRADE PYELOGRAM/URETERAL STENT PLACEMENT AND INDOCYANINE GREEN DYE INJECTION;  Surgeon: Melody Spurling., MD;  Location: [facility];  Service: Urology;  Laterality: Left;   CYSTOSCOPY WITH URETHRAL DILATATION N/A 04/12/2015   Procedure: CYSTOSCOPY WITH URETHRAL DILATATION, RETROGRADE AND URETHROGRAM WITH BLADDER BIOPSY;  Surgeon: Homero Luster, MD;  Location: [facility];  Service: Urology;  Laterality: N/A;  ROBOTIC ASSISTED LAPAROSCOPIC BLADDER DIVERTICULECTOMY N/A 05/13/2023   Procedure: XI ROBOTIC ASSISTED LAPAROSCOPIC PARTIAL CYSTECTOMY, OPNE UMBILICAL HERNIA REPAIR;  Surgeon: Melody Spurling., MD;  Location: [facility];  Service: Urology;  Laterality: N/A;   ROTATOR CUFF REPAIR Right 01-21-2012   TRANSTHORACIC ECHOCARDIOGRAM  03-26-2007   normal LVSF, ef 60%,  mild AV calcification without stenosis,  mild MR and TR,  mild LAE   URETHROGRAM N/A 04/12/2015   Procedure: URETHROGRAM;  Surgeon: Homero Luster, MD;  Location: [facility];  Service: Urology;  Laterality: N/A;   VENTRAL  HERNIA REPAIR N/A 10/06/2023   Procedure: LAPAROSCOPIC VENTRAL HERNIA repair with mesh;  Surgeon: Adalberto Acton, MD;  Location: [facility];  Service: General;  Laterality: N/A;    reports that he has never smoked. He has never used smokeless tobacco. He reports that he does not currently use alcohol. He reports that he does not use drugs. family history includes Cancer in his father; Coronary artery disease in his father; Diabetes in an other family member; Hyperlipidemia in an other family member; Hypertension in an other family member; Stroke in an other family member. Allergies  Allergen Reactions   Gabapentin  Swelling   Crestor  [Rosuvastatin  Calcium ] Other (See Comments)    abd pain   Rosuvastatin  Calcium      Other Reaction(s): Other (See Comments)  abd pain   Current Outpatient Medications on File Prior to Visit  Medication Sig Dispense Refill   amLODipine  (NORVASC ) 10 MG tablet Take 1 tablet by mouth once daily 90 tablet 3   anastrozole (ARIMIDEX) 1 MG tablet Take 1 mg by mouth once a week.     Ascorbic Acid (VITAMIN C) 1000 MG tablet Take 1,000 mg by mouth daily.     ASTRAGALUS PO Take 500 mg by mouth daily.     benazepril  (LOTENSIN ) 40 MG tablet Take 1 tablet by mouth once daily 90 tablet 0   cetirizine (ZYRTEC) 10 MG tablet Take 10 mg by mouth daily as needed for allergies.     Cholecalciferol (VITAMIN D3 ULTRA STRENGTH) 125 MCG (5000 UT) capsule Take 5,000 Units by mouth daily.     Coenzyme Q10 (CO Q-10) 300 MG CAPS Take 300 mg by mouth daily.     diazepam  (VALIUM ) 5 MG tablet Take 1 tablet (5 mg total) by mouth 2 (two) times daily. 10 tablet 0   Docusate Sodium  (DSS) 100 MG CAPS Take 100 mg by mouth.     doxazosin  (CARDURA ) 4 MG tablet TAKE 1 TABLET BY MOUTH AT BEDTIME 90 tablet 3   DULoxetine  (CYMBALTA ) 60 MG capsule Take 1 capsule by mouth once daily in the morning 90 capsule 3   ezetimibe  (ZETIA ) 10 MG tablet Take 1 tablet by mouth once daily 90 tablet 1   fluticasone   (FLONASE ) 50 MCG/ACT nasal spray Use 2 spray(s) in each nostril once daily (Patient taking differently: Place 2 sprays into both nostrils daily as needed for allergies.) 16 g 0   GARLIC PO Take 2 tablets by mouth 2 (two) times daily.     Ginger, Zingiber officinalis, (GINGER ROOT) 550 MG CAPS Take 550 mg by mouth daily.     hydrochlorothiazide  (HYDRODIURIL ) 25 MG tablet Take 1/2 (one-half) tablet by mouth once daily 45 tablet 1   LORazepam  (ATIVAN ) 1 MG tablet Take 1 tablet by mouth twice daily as needed for anxiety 60 tablet 2   Misc Natural Products (BEET ROOT PO) Take 800 mg by mouth daily.  Misc Natural Products (PROSTATE) CAPS Take 1 tablet by mouth daily. Prostate urinary health     Multiple Vitamin (MULTIVITAMIN WITH MINERALS) TABS tablet Take 1 tablet by mouth daily.     Omega-3 Fatty Acids (FISH OIL) 1200 MG CAPS Take 1,200 mg by mouth 2 (two) times daily.     OVER THE COUNTER MEDICATION Take 1 tablet by mouth daily. Kidney Support supplement     oxyCODONE -acetaminophen  (PERCOCET) 5-325 MG tablet Take 1 tablet by mouth every 8 (eight) hours as needed for severe pain (pain score 7-10). For- chronic pain- 90 tablet 0   potassium chloride  (KLOR-CON ) 10 MEQ tablet Take 2 tablets (20 mEq total) by mouth daily. (Patient taking differently: Take 10 mEq by mouth 2 (two) times daily.) 180 tablet 3   Prasterone, DHEA, (DHEA PO) Take 200 mg by mouth 2 (two) times daily.     pravastatin  (PRAVACHOL ) 80 MG tablet Take 1 tablet by mouth once daily 90 tablet 3   predniSONE  (DELTASONE ) 20 MG tablet Take 20 mg by mouth.     Probiotic Product (PROBIOTIC MULTI-ENZYME) TABS Take 1 tablet by mouth daily.     Protein POWD Take 1 Scoop by mouth daily as needed (instead of meal). Also use protein drink or bar     Saw Palmetto 1000 MG CAPS Take 1,000 mg by mouth 2 (two) times daily.     SEMAGLUTIDE PO Place 1 Dose under the tongue daily.     tamsulosin  (FLOMAX ) 0.4 MG CAPS capsule Take 1 capsule by mouth once  daily 90 capsule 0   testosterone  cypionate (DEPOTESTOSTERONE CYPIONATE) 200 MG/ML injection Inject 100 mg into the muscle 2 (two) times a week.     tirzepatide  (ZEPBOUND ) 2.5 MG/0.5ML Pen Inject 2.5 mg into the skin.     Turmeric 500 MG CAPS Take 500 mg by mouth daily.     VITAMIN K PO Take 1 capsule by mouth 2 (two) times daily.     No current facility-administered medications on file prior to visit.        ROS:  All others reviewed and negative.  Objective        PE:  BP 128/80 (BP Location: Right Arm, Patient Position: Sitting, Cuff Size: Normal)   Pulse 80   Temp 98.5 F (36.9 C) (Oral)   Ht 5\' 7"  (1.702 m)   Wt 278 lb (126.1 kg)   SpO2 97%   BMI 43.54 kg/m                 Constitutional: Pt appears in NAD               HENT: Head: NCAT.                Right Ear: External ear normal.                 Left Ear: External ear normal.                Eyes: . Pupils are equal, round, and reactive to light. Conjunctivae and EOM are normal               Nose: without d/c or deformity               Neck: Neck supple. Gross normal ROM               Cardiovascular: Normal rate and regular rhythm.  Pulmonary/Chest: Effort normal and breath sounds without rales or wheezing.                Abd:  Soft, NT, ND, + BS, no organomegaly               Neurological: Pt is alert. At baseline orientation, motor grossly intact               Skin: Skin is warm. No rashes, no other new lesions, LE edema - none               Psychiatric: Pt behavior is normal without agitation   Micro: none  Cardiac tracings I have personally interpreted today:  none  Pertinent Radiological findings (summarize): none   Lab Results  Component Value Date   WBC 7.3 01/04/2024   HGB 16.4 01/04/2024   HCT 49.9 01/04/2024   PLT 182.0 01/04/2024   GLUCOSE 80 01/04/2024   CHOL 145 01/04/2024   TRIG 92.0 01/04/2024   HDL 40.70 01/04/2024   LDLDIRECT 139.0 01/02/2016   LDLCALC 86 01/04/2024   ALT  42 01/04/2024   AST 25 01/04/2024   NA 140 01/04/2024   K 4.5 01/04/2024   CL 101 01/04/2024   CREATININE 1.43 01/04/2024   BUN 15 01/04/2024   CO2 26 01/04/2024   TSH 1.71 01/04/2024   PSA 6.77 (H) 01/04/2024   HGBA1C 5.6 01/04/2024   MICROALBUR 5.6 (H) 01/04/2024   Assessment/Plan:  Darren Black is a 58 y.o. White or Caucasian [1] male with  has a past medical history of Anxiety, BPH (benign prostatic hypertrophy), Chronic lower back pain, Depression, Diarrhea (03/23/2019), Family history of adverse reaction to anesthesia, GERD (gastroesophageal reflux disease), History of panic attacks, Hyperlipidemia, Hypertension, Meatal stenosis, OSA on CPAP, and PONV (postoperative nausea and vomiting).  Encounter for well adult exam with abnormal findings Age and sex appropriate education and counseling updated with regular exercise and diet Referrals for preventative services - none needed Immunizations addressed - for shignrix and prevnar 20 Smoking counseling  - none needed Evidence for depression or other mood disorder - stable anxiety depression Most recent labs reviewed. I have personally reviewed and have noted: 1) the patient's medical and social history 2) The patient's current medications and supplements 3) The patient's height, weight, and BMI have been recorded in the chart   Chronic lower back pain Chronic persistent stable  Hyperlipidemia Lab Results  Component Value Date   LDLCALC 86 01/04/2024   Uncontrolled, goal ldl < 70, pt to continue zetia  10 every day, and pravachol  80 every day, declines other change today   Essential hypertension BP Readings from Last 3 Encounters:  01/06/24 128/80  12/23/23 134/76  10/26/23 129/85   Stable, pt to continue medical treatment norvasc  10 every day, lotension 40 qd   CKD (chronic kidney disease) stage 3, GFR 30-59 ml/min (HCC) Lab Results  Component Value Date   CREATININE 1.43 01/04/2024   Stable overall, cont to  avoid nephrotoxins  Hyperglycemia Lab Results  Component Value Date   HGBA1C 5.6 01/04/2024   Stable, pt to continue current medical treatment  - diet, wt control   UTI (urinary tract infection) Subclinical it seems but also has elevated psa - for cipro  500 bid and f/u psa in 1 month  Elevated PSA Asympt, likely infectious related given UA also, for empiric cipro  course and f/u psa in 1 mo  Followup: Return in about 6 months (around 07/08/2024).  Rosalia Colonel, MD 01/06/2024  9:04 PM [facility]

## 2024-01-06 NOTE — Assessment & Plan Note (Signed)
 BP Readings from Last 3 Encounters:  01/06/24 128/80  12/23/23 134/76  10/26/23 129/85   Stable, pt to continue medical treatment norvasc  10 every day, lotension 40 qd

## 2024-01-06 NOTE — Assessment & Plan Note (Signed)
 Chronic persistent stable

## 2024-01-06 NOTE — Assessment & Plan Note (Signed)
 Lab Results  Component Value Date   LDLCALC 86 01/04/2024   Uncontrolled, goal ldl < 70, pt to continue zetia  10 every day, and pravachol  80 every day, declines other change today

## 2024-01-06 NOTE — Assessment & Plan Note (Signed)
 Lab Results  Component Value Date   CREATININE 1.43 01/04/2024   Stable overall, cont to avoid nephrotoxins

## 2024-01-06 NOTE — Assessment & Plan Note (Signed)
 Age and sex appropriate education and counseling updated with regular exercise and diet Referrals for preventative services - none needed Immunizations addressed - for shignrix and prevnar 20 Smoking counseling  - none needed Evidence for depression or other mood disorder - stable anxiety depression Most recent labs reviewed. I have personally reviewed and have noted: 1) the patient's medical and social history 2) The patient's current medications and supplements 3) The patient's height, weight, and BMI have been recorded in the chart

## 2024-01-06 NOTE — Patient Instructions (Addendum)
 Please have your Shingrix (shingles) shots done at your local pharmacy, and the Prevnar 20 pneumonia shot as well  Please take all new medication as prescribed - the antibiotic  Please continue all other medications as before, and refills have been done if requested.  Please have the pharmacy call with any other refills you may need.  Please continue your efforts at being more active, low cholesterol diet, and weight control.  You are otherwise up to date with prevention measures today.  Please keep your appointments with your specialists as you may have planned  Please plan to recheck the tests in 1 month  You will be contacted regarding the referral for: Cardiac CT score  Please make an Appointment to return in 6 months, or sooner if needed, also with Lab Appointment for testing done 3-5 days before at the FIRST FLOOR Lab (so this is for TWO appointments - please see the scheduling desk as you leave)

## 2024-01-06 NOTE — Assessment & Plan Note (Signed)
 Subclinical it seems but also has elevated psa - for cipro  500 bid and f/u psa in 1 month

## 2024-01-06 NOTE — Assessment & Plan Note (Signed)
 Asympt, likely infectious related given UA also, for empiric cipro  course and f/u psa in 1 mo

## 2024-01-20 ENCOUNTER — Other Ambulatory Visit: Payer: Self-pay

## 2024-01-20 ENCOUNTER — Other Ambulatory Visit: Payer: Self-pay | Admitting: Internal Medicine

## 2024-01-20 NOTE — Telephone Encounter (Signed)
 Need refill

## 2024-01-21 MED ORDER — OXYCODONE-ACETAMINOPHEN 5-325 MG PO TABS: 1.0000 | ORAL_TABLET | Freq: Three times a day (TID) | ORAL | 0 refills | Status: DC | PRN

## 2024-01-26 ENCOUNTER — Other Ambulatory Visit: Payer: Self-pay

## 2024-01-26 ENCOUNTER — Other Ambulatory Visit: Payer: Self-pay | Admitting: Internal Medicine

## 2024-02-02 DIAGNOSIS — G4733 Obstructive sleep apnea (adult) (pediatric): Secondary | ICD-10-CM | POA: Diagnosis not present

## 2024-02-08 ENCOUNTER — Other Ambulatory Visit (INDEPENDENT_AMBULATORY_CARE_PROVIDER_SITE_OTHER)

## 2024-02-08 DIAGNOSIS — R739 Hyperglycemia, unspecified: Secondary | ICD-10-CM | POA: Diagnosis not present

## 2024-02-08 DIAGNOSIS — E7800 Pure hypercholesterolemia, unspecified: Secondary | ICD-10-CM | POA: Diagnosis not present

## 2024-02-08 DIAGNOSIS — E559 Vitamin D deficiency, unspecified: Secondary | ICD-10-CM | POA: Diagnosis not present

## 2024-02-08 DIAGNOSIS — R972 Elevated prostate specific antigen [PSA]: Secondary | ICD-10-CM | POA: Diagnosis not present

## 2024-02-08 DIAGNOSIS — N390 Urinary tract infection, site not specified: Secondary | ICD-10-CM | POA: Diagnosis not present

## 2024-02-08 LAB — HEPATIC FUNCTION PANEL
ALT: 28 U/L (ref 0–53)
AST: 21 U/L (ref 0–37)
Albumin: 4.2 g/dL (ref 3.5–5.2)
Alkaline Phosphatase: 45 U/L (ref 39–117)
Bilirubin, Direct: 0.1 mg/dL (ref 0.0–0.3)
Total Bilirubin: 0.6 mg/dL (ref 0.2–1.2)
Total Protein: 6.5 g/dL (ref 6.0–8.3)

## 2024-02-08 LAB — LIPID PANEL
Cholesterol: 116 mg/dL (ref 0–200)
HDL: 39.1 mg/dL (ref >39.00)
LDL Cholesterol: 53 mg/dL (ref 0–99)
NonHDL: 76.7
Total CHOL/HDL Ratio: 3
Triglycerides: 118 mg/dL (ref 0.0–149.0)
VLDL: 23.6 mg/dL (ref 0.0–40.0)

## 2024-02-08 LAB — BASIC METABOLIC PANEL WITH GFR
BUN: 13 mg/dL (ref 6–23)
CO2: 30 meq/L (ref 19–32)
Calcium: 9.1 mg/dL (ref 8.4–10.5)
Chloride: 100 meq/L (ref 96–112)
Creatinine, Ser: 1.49 mg/dL (ref 0.40–1.50)
GFR: 51.98 mL/min — ABNORMAL LOW (ref >60.00)
Glucose, Bld: 96 mg/dL (ref 70–99)
Potassium: 3.6 meq/L (ref 3.5–5.1)
Sodium: 137 meq/L (ref 135–145)

## 2024-02-08 LAB — HEMOGLOBIN A1C: Hgb A1c MFr Bld: 5.3 % (ref 4.6–6.5)

## 2024-02-09 ENCOUNTER — Ambulatory Visit: Payer: Self-pay | Admitting: Internal Medicine

## 2024-02-09 LAB — URINALYSIS, ROUTINE W REFLEX MICROSCOPIC
Bilirubin Urine: NEGATIVE
Hgb urine dipstick: NEGATIVE
Ketones, ur: NEGATIVE
Leukocytes,Ua: NEGATIVE
Nitrite: NEGATIVE
RBC / HPF: NONE SEEN (ref 0–?)
Specific Gravity, Urine: <=1.005 — AB (ref 1.000–1.030)
Total Protein, Urine: NEGATIVE
Urine Glucose: NEGATIVE
Urobilinogen, UA: 0.2 (ref 0.0–1.0)
WBC, UA: NONE SEEN (ref 0–?)
pH: 6 (ref 5.0–8.0)

## 2024-02-09 LAB — URINE CULTURE
MICRO NUMBER:: 16555675
Result:: NO GROWTH
SPECIMEN QUALITY:: ADEQUATE

## 2024-02-10 ENCOUNTER — Other Ambulatory Visit (HOSPITAL_COMMUNITY)

## 2024-02-11 LAB — PSA: PSA: 1.54 ng/mL (ref 0.10–4.00)

## 2024-02-11 LAB — VITAMIN D 25 HYDROXY (VIT D DEFICIENCY, FRACTURES): VITD: 43.19 ng/mL (ref 30.00–100.00)

## 2024-02-11 NOTE — Progress Notes (Signed)
 The test results show that your current treatment is OK, as the tests are stable.  Please continue the same plan.  There is no other need for change of treatment or further evaluation based on these results, at this time.  thanks

## 2024-02-16 NOTE — Progress Notes (Signed)
 Subjective:    Patient ID: Darren Black, male    DOB: 11-28-1965, 58 y.o.   MRN: [id number]  HPI: Darren Black is a 58 y.o. male who returns for follow up appointment for chronic pain and medication refill. He states his pain is located in his neck radiating into his bilateral shoulders and lower back radiating into his bilateral lower extremities. He rates his pain 9. His current exercise regime is walking and performing stretching exercises.  Darren Black Morphine  equivalent is 22.50 MME. He  is also prescribed Lorazepam  by Dr. Norleen .We have discussed the black box warning of using opioids and benzodiazepines. I highlighted the dangers of using these drugs together and discussed the adverse events including respiratory suppression, overdose, cognitive impairment and importance of compliance with current regimen. We will continue to monitor and adjust as indicated.   Last Oral Swab was Performed on 12/23/2023, it was consistent.       Pain Inventory Average Pain 9 Pain Right Now 9 My pain is constant, sharp, burning, dull, stabbing, and aching  In the last 24 hours, has pain interfered with the following? General activity 9 Relation with others 9 Enjoyment of life 8 What TIME of day is your pain at its worst? morning , daytime, evening, and night Sleep (in general) Poor  Pain is worse with: walking, bending, sitting, inactivity, standing, and some activites Pain improves with: medication Relief from Meds: 1  Family History  Problem Relation Age of Onset   Coronary artery disease Father    Cancer Father        anaplastic thyroid  cancer   Stroke Other        1st degree relative   Diabetes Other        1st degree relative   Hypertension Other    Hyperlipidemia Other    Social History   Socioeconomic History   Marital status: Married    Spouse name: Not on file   Number of children: Not on file   Years of education: Not on file   Highest education level: Not on file   Occupational History   Occupation: disabled, LBP and anxiety  Tobacco Use   Smoking status: Never   Smokeless tobacco: Never  Vaping Use   Vaping status: Never Used  Substance and Sexual Activity   Alcohol use: Not Currently   Drug use: No   Sexual activity: Not on file  Other Topics Concern   Not on file  Social History Narrative   Not on file   Social Drivers of Health   Financial Resource Strain: Low Risk  (01/22/2022)   Overall Financial Resource Strain (CARDIA)    Difficulty of Paying Living Expenses: Not hard at all  Food Insecurity: No Food Insecurity (05/20/2023)   Hunger Vital Sign    Worried About Running Out of Food in the Last Year: Never true    Ran Out of Food in the Last Year: Never true  Transportation Needs: No Transportation Needs (05/20/2023)   PRAPARE - Administrator, Civil Service (Medical): No    Lack of Transportation (Non-Medical): No  Physical Activity: Inactive (01/22/2022)   Exercise Vital Sign    Days of Exercise per Week: 0 days    Minutes of Exercise per Session: 0 min  Stress: No Stress Concern Present (01/22/2022)   [employer] of Occupational Health - Occupational Stress Questionnaire    Feeling of Stress : Not at all  Social Connections: Moderately Isolated (01/22/2022)  Social Connection and Isolation Panel    Frequency of Communication with Friends and Family: More than three times a week    Frequency of Social Gatherings with Friends and Family: Once a week    Attends Religious Services: Never    Database administrator or Organizations: No    Attends Engineer, structural: Never    Marital Status: Married   Past Surgical History:  Procedure Laterality Date   APPENDECTOMY  2003   BREATH SHIRLEAN VEAR LORA  04/27/2012   Procedure: BREATH TEK H PYLORI;  Surgeon: Donnice KATHEE Lunger, MD;  Location: [facility];  Service: General;  Laterality: N/A;   CHOLECYSTECTOMY N/A 03/24/2019   Procedure: LAPAROSCOPIC CHOLECYSTECTOMY  WITH INTRAOPERATIVE CHOLANGIOGRAM;  Surgeon: Belinda Donnice, MD;  Location: [facility];  Service: General;  Laterality: N/A;   CYSTOSCOPY W/ URETERAL STENT PLACEMENT Left 05/13/2023   Procedure: CYSTOSCOPY WITH RETROGRADE PYELOGRAM/URETERAL STENT PLACEMENT AND INDOCYANINE GREEN DYE INJECTION;  Surgeon: Alvaro Ricardo KATHEE Mickey., MD;  Location: [facility];  Service: Urology;  Laterality: Left;   CYSTOSCOPY WITH URETHRAL DILATATION N/A 04/12/2015   Procedure: CYSTOSCOPY WITH URETHRAL DILATATION, RETROGRADE AND URETHROGRAM WITH BLADDER BIOPSY;  Surgeon: Darren Seltzer, MD;  Location: [facility];  Service: Urology;  Laterality: N/A;   ROBOTIC ASSISTED LAPAROSCOPIC BLADDER DIVERTICULECTOMY N/A 05/13/2023   Procedure: XI ROBOTIC ASSISTED LAPAROSCOPIC PARTIAL CYSTECTOMY, OPNE UMBILICAL HERNIA REPAIR;  Surgeon: Alvaro Ricardo KATHEE Mickey., MD;  Location: [facility];  Service: Urology;  Laterality: N/A;   ROTATOR CUFF REPAIR Right 01-21-2012   TRANSTHORACIC ECHOCARDIOGRAM  03-26-2007   normal LVSF, ef 60%,  mild AV calcification without stenosis,  mild MR and TR,  mild LAE   URETHROGRAM N/A 04/12/2015   Procedure: URETHROGRAM;  Surgeon: Darren Seltzer, MD;  Location: [facility];  Service: Urology;  Laterality: N/A;   VENTRAL HERNIA REPAIR N/A 10/06/2023   Procedure: LAPAROSCOPIC VENTRAL HERNIA repair with mesh;  Surgeon: Signe Mitzie LABOR, MD;  Location: [facility];  Service: General;  Laterality: N/A;   Past Surgical History:  Procedure Laterality Date   APPENDECTOMY  2003   BREATH TEK H PYLORI  04/27/2012   Procedure: BREATH TEK H PYLORI;  Surgeon: Donnice KATHEE Lunger, MD;  Location: [facility];  Service: General;  Laterality: N/A;   CHOLECYSTECTOMY N/A 03/24/2019   Procedure: LAPAROSCOPIC CHOLECYSTECTOMY WITH INTRAOPERATIVE CHOLANGIOGRAM;  Surgeon: Belinda Donnice, MD;  Location: [facility];  Service: General;  Laterality: N/A;   CYSTOSCOPY W/ URETERAL STENT PLACEMENT Left 05/13/2023   Procedure: CYSTOSCOPY WITH  RETROGRADE PYELOGRAM/URETERAL STENT PLACEMENT AND INDOCYANINE GREEN DYE INJECTION;  Surgeon: Alvaro Ricardo KATHEE Mickey., MD;  Location: [facility];  Service: Urology;  Laterality: Left;   CYSTOSCOPY WITH URETHRAL DILATATION N/A 04/12/2015   Procedure: CYSTOSCOPY WITH URETHRAL DILATATION, RETROGRADE AND URETHROGRAM WITH BLADDER BIOPSY;  Surgeon: Darren Seltzer, MD;  Location: [facility];  Service: Urology;  Laterality: N/A;   ROBOTIC ASSISTED LAPAROSCOPIC BLADDER DIVERTICULECTOMY N/A 05/13/2023   Procedure: XI ROBOTIC ASSISTED LAPAROSCOPIC PARTIAL CYSTECTOMY, OPNE UMBILICAL HERNIA REPAIR;  Surgeon: Alvaro Ricardo KATHEE Mickey., MD;  Location: [facility];  Service: Urology;  Laterality: N/A;   ROTATOR CUFF REPAIR Right 01-21-2012   TRANSTHORACIC ECHOCARDIOGRAM  03-26-2007   normal LVSF, ef 60%,  mild AV calcification without stenosis,  mild MR and TR,  mild LAE   URETHROGRAM N/A 04/12/2015   Procedure: URETHROGRAM;  Surgeon: Darren Seltzer, MD;  Location: [facility];  Service: Urology;  Laterality: N/A;  VENTRAL HERNIA REPAIR N/A 10/06/2023   Procedure: LAPAROSCOPIC VENTRAL HERNIA repair with mesh;  Surgeon: Signe Mitzie LABOR, MD;  Location: [facility];  Service: General;  Laterality: N/A;   Past Medical History:  Diagnosis Date   Anxiety    BPH (benign prostatic hypertrophy)    Chronic lower back pain    nerve damage w/ leg pain   Depression    Diarrhea 03/23/2019   Family history of adverse reaction to anesthesia    MOTHER--- HARD TO WAKE   GERD (gastroesophageal reflux disease)    History of panic attacks    Hyperlipidemia    Hypertension    Meatal stenosis    OSA on CPAP    moderate to severe OSA  per study 04-28-2007 uses CPAP   PONV (postoperative nausea and vomiting)    Hard to wake up   BP 114/83   Pulse 77   Ht 5' 7 (1.702 m)   Wt 277 lb (125.6 kg)   SpO2 95%   BMI 43.38 kg/m   Opioid Risk Score:   Fall Risk Score:  `1  Depression screen PHQ 2/9     02/17/2024     1:04 PM 01/06/2024    2:23 PM 01/06/2024    2:01 PM 12/23/2023   11:27 AM 10/26/2023    1:39 PM 08/18/2023   11:27 AM 07/09/2023    1:12 PM  Depression screen PHQ 2/9  Decreased Interest 1 0 0 0 1 0 0  Down, Depressed, Hopeless 1 0 0 0 1 0 0  PHQ - 2 Score 2 0 0 0 2 0 0    Review of Systems  Musculoskeletal:  Positive for back pain and neck pain.       Pain in the right hip & both knees  All other systems reviewed and are negative.       Objective:   Physical Exam Vitals and nursing note reviewed.  Constitutional:      Appearance: Normal appearance.  Cardiovascular:     Rate and Rhythm: Normal rate and regular rhythm.     Pulses: Normal pulses.     Heart sounds: Normal heart sounds.  Pulmonary:     Effort: Pulmonary effort is normal.     Breath sounds: Normal breath sounds.  Musculoskeletal:     Comments: Normal Muscle Bulk and Muscle Testing Reveals:  Upper Extremities: Decreased ROM 90 Degrees and Muscle Strength 5/5 Lumbar Paraspinal Tenderness: L-3-L-5 Lower Extremities: Full ROM and Muscle Strength 5/5 Arises from Table slowly Narrow Based  Gait     Skin:    General: Skin is warm and dry.  Neurological:     Mental Status: He is alert and oriented to person, place, and time.  Psychiatric:        Mood and Affect: Mood normal.        Behavior: Behavior normal.          Assessment & Plan:  Cervicalgia/ Cervical Radiculitis: Continue HEP as Tolerated and Continue current medication regimen.  Lumbar Radiculitis: Continue HEP as Tolerated. Continue current medication regimen. Continue to Monitor. 02/17/2024 Chronic Bilateral Thoracic Pain: No complaints today. Continue HEP as Tolerated. Continue to Monitor. 02/17/2024 Depression/ Anxiety: PCP Following. Continue current Medication Regimen. Continue to Monitor. 02/17/2024 Chronic Pain Syndrome: Refilled: Oxycodone  5/325 mg one tablet every 8 hours as needed for pain #80..We will continue the opioid monitoring program,  this consists of regular clinic visits, examinations, urine drug screen, pill counts as well as use of  [place]  Controlled Substance Reporting system. A 12 month History has been reviewed on the [place]  Controlled Substance Reporting System on 02/17/2024.     F/U in 2  months.

## 2024-02-17 ENCOUNTER — Encounter: Attending: Registered Nurse | Admitting: Registered Nurse

## 2024-02-17 ENCOUNTER — Encounter: Payer: Self-pay | Admitting: Registered Nurse

## 2024-02-17 VITALS — BP 114/83 | HR 77 | Ht 67.0 in | Wt 277.0 lb

## 2024-02-17 DIAGNOSIS — Z5181 Encounter for therapeutic drug level monitoring: Secondary | ICD-10-CM | POA: Diagnosis not present

## 2024-02-17 DIAGNOSIS — M542 Cervicalgia: Secondary | ICD-10-CM | POA: Insufficient documentation

## 2024-02-17 DIAGNOSIS — G894 Chronic pain syndrome: Secondary | ICD-10-CM | POA: Insufficient documentation

## 2024-02-17 DIAGNOSIS — M5412 Radiculopathy, cervical region: Secondary | ICD-10-CM | POA: Diagnosis not present

## 2024-02-17 DIAGNOSIS — M5416 Radiculopathy, lumbar region: Secondary | ICD-10-CM | POA: Diagnosis not present

## 2024-02-17 DIAGNOSIS — Z79891 Long term (current) use of opiate analgesic: Secondary | ICD-10-CM | POA: Diagnosis not present

## 2024-02-17 MED ORDER — OXYCODONE-ACETAMINOPHEN 5-325 MG PO TABS: 1.0000 | ORAL_TABLET | Freq: Three times a day (TID) | ORAL | 0 refills | Status: DC | PRN

## 2024-02-29 ENCOUNTER — Other Ambulatory Visit: Payer: Self-pay | Admitting: Internal Medicine

## 2024-03-01 ENCOUNTER — Other Ambulatory Visit: Payer: Self-pay

## 2024-03-02 DIAGNOSIS — E291 Testicular hypofunction: Secondary | ICD-10-CM | POA: Diagnosis not present

## 2024-03-12 ENCOUNTER — Other Ambulatory Visit: Payer: Self-pay | Admitting: Internal Medicine

## 2024-03-16 ENCOUNTER — Other Ambulatory Visit: Payer: Self-pay | Admitting: Internal Medicine

## 2024-03-18 ENCOUNTER — Telehealth: Payer: Self-pay

## 2024-03-18 NOTE — Telephone Encounter (Signed)
 Patient calling requesting on Oxycodone .

## 2024-04-11 ENCOUNTER — Ambulatory Visit (INDEPENDENT_AMBULATORY_CARE_PROVIDER_SITE_OTHER)

## 2024-04-11 ENCOUNTER — Encounter: Payer: Self-pay | Admitting: Internal Medicine

## 2024-04-11 VITALS — Ht 67.0 in | Wt 277.0 lb

## 2024-04-11 DIAGNOSIS — Z0000 Encounter for general adult medical examination without abnormal findings: Secondary | ICD-10-CM | POA: Diagnosis not present

## 2024-04-11 DIAGNOSIS — R300 Dysuria: Secondary | ICD-10-CM

## 2024-04-11 NOTE — Progress Notes (Signed)
 Subjective:   Darren Black is a 58 y.o. who presents for a Medicare Wellness preventive visit.  As a reminder, Annual Wellness Visits don't include a physical exam, and some assessments may be limited, especially if this visit is performed virtually. We may recommend an in-person follow-up visit with your provider if needed.  Visit Complete: Virtual I connected with  Darren Black on 04/11/24 by a audio enabled telemedicine application and verified that I am speaking with the correct person using two identifiers.  Patient Location: Home  Provider Location: Home Office  I discussed the limitations of evaluation and management by telemedicine. The patient expressed understanding and agreed to proceed.  Vital Signs: Because this visit was a virtual/telehealth visit, some criteria may be missing or patient reported. Any vitals not documented were not able to be obtained and vitals that have been documented are patient reported.  VideoDeclined- This patient declined Librarian, academic. Therefore the visit was completed with audio only.  Persons Participating in Visit: Patient.  AWV Questionnaire: No: Patient Medicare AWV questionnaire was not completed prior to this visit.  Cardiac Risk Factors include: advanced age (>45men, >34 women);hypertension;male gender;Other (see comment);dyslipidemia, Risk factor comments: OSA, CKD stage 3     Objective:    Today's Vitals   04/11/24 1319  Weight: 277 lb (125.6 kg)  Height: 5' 7 (1.702 m)   Body mass index is 43.38 kg/m.     04/11/2024    1:38 PM 09/28/2023    2:05 PM 05/13/2023   10:00 PM 05/01/2023    2:26 PM 10/21/2022    4:50 PM 05/29/2022    3:43 AM 02/21/2022   11:09 AM  Advanced Directives  Does Patient Have a Medical Advance Directive? No No Yes No No No No  Type of Advance Directive   Living will      Does patient want to make changes to medical advance directive?   No - Guardian declined       Would patient like information on creating a medical advance directive? No - Patient declined No - Patient declined  Yes (MAU/Ambulatory/Procedural Areas - Information given) No - Patient declined No - Patient declined     Current Medications (verified) Outpatient Encounter Medications as of 04/11/2024  Medication Sig   amLODipine  (NORVASC ) 10 MG tablet Take 1 tablet by mouth once daily   anastrozole (ARIMIDEX) 1 MG tablet Take 1 mg by mouth once a week.   Ascorbic Acid (VITAMIN C) 1000 MG tablet Take 1,000 mg by mouth daily.   ASTRAGALUS PO Take 500 mg by mouth daily.   benazepril  (LOTENSIN ) 40 MG tablet Take 1 tablet by mouth once daily   cetirizine (ZYRTEC) 10 MG tablet Take 10 mg by mouth daily as needed for allergies.   Cholecalciferol (VITAMIN D3 ULTRA STRENGTH) 125 MCG (5000 UT) capsule Take 5,000 Units by mouth daily.   Coenzyme Q10 (CO Q-10) 300 MG CAPS Take 300 mg by mouth daily.   diazepam  (VALIUM ) 5 MG tablet Take 1 tablet (5 mg total) by mouth 2 (two) times daily.   Docusate Sodium  (DSS) 100 MG CAPS Take 100 mg by mouth.   doxazosin  (CARDURA ) 4 MG tablet TAKE 1 TABLET BY MOUTH AT BEDTIME   DULoxetine  (CYMBALTA ) 60 MG capsule Take 1 capsule by mouth once daily in the morning   ezetimibe  (ZETIA ) 10 MG tablet Take 1 tablet by mouth once daily   fluticasone  (FLONASE ) 50 MCG/ACT nasal spray Use 2 spray(s) in each nostril  once daily (Patient taking differently: Place 2 sprays into both nostrils daily as needed for allergies.)   GARLIC PO Take 2 tablets by mouth 2 (two) times daily.   Ginger, Zingiber officinalis, (GINGER ROOT) 550 MG CAPS Take 550 mg by mouth daily.   hydrochlorothiazide  (HYDRODIURIL ) 25 MG tablet Take 1/2 (one-half) tablet by mouth once daily   LORazepam  (ATIVAN ) 1 MG tablet Take 1 tablet by mouth twice daily as needed for anxiety   Misc Natural Products (BEET ROOT PO) Take 800 mg by mouth daily.   Misc Natural Products (PROSTATE) CAPS Take 1 tablet by mouth daily.  Prostate urinary health   Multiple Vitamin (MULTIVITAMIN WITH MINERALS) TABS tablet Take 1 tablet by mouth daily.   Omega-3 Fatty Acids (FISH OIL) 1200 MG CAPS Take 1,200 mg by mouth 2 (two) times daily.   OVER THE COUNTER MEDICATION Take 1 tablet by mouth daily. Kidney Support supplement   oxyCODONE -acetaminophen  (PERCOCET) 5-325 MG tablet Take 1 tablet by mouth every 8 (eight) hours as needed for severe pain (pain score 7-10). For- chronic pain-   potassium chloride  (KLOR-CON ) 10 MEQ tablet Take 2 tablets by mouth once daily   Prasterone, DHEA, (DHEA PO) Take 200 mg by mouth 2 (two) times daily.   pravastatin  (PRAVACHOL ) 80 MG tablet Take 1 tablet by mouth once daily   predniSONE  (DELTASONE ) 20 MG tablet Take 20 mg by mouth.   Probiotic Product (PROBIOTIC MULTI-ENZYME) TABS Take 1 tablet by mouth daily.   Protein POWD Take 1 Scoop by mouth daily as needed (instead of meal). Also use protein drink or bar   Saw Palmetto 1000 MG CAPS Take 1,000 mg by mouth 2 (two) times daily.   SEMAGLUTIDE PO Place 1 Dose under the tongue daily.   tamsulosin  (FLOMAX ) 0.4 MG CAPS capsule Take 1 capsule by mouth once daily   testosterone  cypionate (DEPOTESTOSTERONE CYPIONATE) 200 MG/ML injection Inject 100 mg into the muscle 2 (two) times a week.   tirzepatide  (ZEPBOUND ) 2.5 MG/0.5ML Pen Inject 2.5 mg into the skin.   Turmeric 500 MG CAPS Take 500 mg by mouth daily.   VITAMIN K PO Take 1 capsule by mouth 2 (two) times daily.   No facility-administered encounter medications on file as of 04/11/2024.    Allergies (verified) Gabapentin , Crestor  [rosuvastatin  calcium ], and Rosuvastatin  calcium    History: Past Medical History:  Diagnosis Date   Anxiety    BPH (benign prostatic hypertrophy)    Chronic lower back pain    nerve damage w/ leg pain   Depression    Diarrhea 03/23/2019   Family history of adverse reaction to anesthesia    MOTHER--- HARD TO WAKE   GERD (gastroesophageal reflux disease)     History of panic attacks    Hyperlipidemia    Hypertension    Meatal stenosis    OSA on CPAP    moderate to severe OSA  per study 04-28-2007 uses CPAP   PONV (postoperative nausea and vomiting)    Hard to wake up   Past Surgical History:  Procedure Laterality Date   APPENDECTOMY  2003   BREATH TEK H PYLORI  04/27/2012   Procedure: BREATH TEK H PYLORI;  Surgeon: Donnice KATHEE Lunger, MD;  Location: [facility];  Service: General;  Laterality: N/A;   CHOLECYSTECTOMY N/A 03/24/2019   Procedure: LAPAROSCOPIC CHOLECYSTECTOMY WITH INTRAOPERATIVE CHOLANGIOGRAM;  Surgeon: Belinda Donnice, MD;  Location: [facility];  Service: General;  Laterality: N/A;   CYSTOSCOPY W/ URETERAL STENT PLACEMENT Left 05/13/2023  Procedure: CYSTOSCOPY WITH RETROGRADE PYELOGRAM/URETERAL STENT PLACEMENT AND INDOCYANINE GREEN DYE INJECTION;  Surgeon: Alvaro Ricardo KATHEE Mickey., MD;  Location: [facility];  Service: Urology;  Laterality: Left;   CYSTOSCOPY WITH URETHRAL DILATATION N/A 04/12/2015   Procedure: CYSTOSCOPY WITH URETHRAL DILATATION, RETROGRADE AND URETHROGRAM WITH BLADDER BIOPSY;  Surgeon: Norleen Seltzer, MD;  Location[facility];  Service: Urology;  Laterality: N/A;   ROBOTIC ASSISTED LAPAROSCOPIC BLADDER DIVERTICULECTOMY N/A 05/13/2023   Procedure: XI ROBOTIC ASSISTED LAPAROSCOPIC PARTIAL CYSTECTOMY, OPNE UMBILICAL HERNIA REPAIR;  Surgeon: Alvaro Ricardo KATHEE Mickey., MD;  Location: [facility];  Service: Urology;  Laterality: N/A;   ROTATOR CUFF REPAIR Right 01-21-2012   TRANSTHORACIC ECHOCARDIOGRAM  03-26-2007   normal LVSF, ef 60%,  mild AV calcification without stenosis,  mild MR and TR,  mild LAE   URETHROGRAM N/A 04/12/2015   Procedure: URETHROGRAM;  Surgeon: Norleen Seltzer, MD;  Location[facility];  Service: Urology;  Laterality: N/A;   VENTRAL HERNIA REPAIR N/A 10/06/2023   Procedure: LAPAROSCOPIC VENTRAL HERNIA repair with mesh;  Surgeon: Signe Mitzie LABOR, MD;  Location: [facility];  Service: General;   Laterality: N/A;   Family History  Problem Relation Age of Onset   Coronary artery disease Father    Cancer Father        anaplastic thyroid  cancer   Stroke Other        1st degree relative   Diabetes Other        1st degree relative   Hypertension Other    Hyperlipidemia Other    Social History   Socioeconomic History   Marital status: Married    Spouse name: Kallie   Number of children: Not on file   Years of education: Not on file   Highest education level: Not on file  Occupational History   Occupation: disabled, LBP and anxiety  Tobacco Use   Smoking status: Never   Smokeless tobacco: Never  Vaping Use   Vaping status: Never Used  Substance and Sexual Activity   Alcohol use: Not Currently   Drug use: No   Sexual activity: Not on file  Other Topics Concern   Not on file  Social History Narrative   Live with wife/2025   Social Drivers of Health   Financial Resource Strain: High Risk (04/11/2024)   Overall Financial Resource Strain (CARDIA)    Difficulty of Paying Living Expenses: Very hard  Food Insecurity: No Food Insecurity (04/11/2024)   Hunger Vital Sign    Worried About Running Out of Food in the Last Year: Never true    Ran Out of Food in the Last Year: Never true  Transportation Needs: No Transportation Needs (04/11/2024)   PRAPARE - Administrator, Civil Service (Medical): No    Lack of Transportation (Non-Medical): No  Physical Activity: Inactive (04/11/2024)   Exercise Vital Sign    Days of Exercise per Week: 0 days    Minutes of Exercise per Session: 0 min  Stress: No Stress Concern Present (04/11/2024)   [employer] of Occupational Health - Occupational Stress Questionnaire    Feeling of Stress: Only a little  Social Connections: Socially Isolated (04/11/2024)   Social Connection and Isolation Panel    Frequency of Communication with Friends and Family: Once a week    Frequency of Social Gatherings with Friends and Family: Once a  week    Attends Religious Services: Never    Database administrator or Organizations: No  Attends Banker Meetings: Never    Marital Status: Married    Tobacco Counseling Counseling given: Not Answered    Clinical Intake:  Pre-visit preparation completed: Yes  Pain : No/denies pain     BMI - recorded: 43.38 Nutritional Status: BMI > 30  Obese Nutritional Risks: None Diabetes: No  Lab Results  Component Value Date   HGBA1C 5.3 02/08/2024   HGBA1C 5.6 01/04/2024   HGBA1C 6.1 06/18/2023     How often do you need to have someone help you when you read instructions, pamphlets, or other written materials from your doctor or pharmacy?: 1 - Never  Interpreter Needed?: No  Information entered by :: Darrall Strey, RMA   Activities of Daily Living     04/11/2024    1:20 PM 09/28/2023    2:07 PM  In your present state of health, do you have any difficulty performing the following activities:  Hearing? 0   Vision? 0   Difficulty concentrating or making decisions? 0   Walking or climbing stairs? 0   Dressing or bathing? 0   Doing errands, shopping? 0 0  Comment wife drives him   Preparing Food and eating ? N   Using the Toilet? N   In the past six months, have you accidently leaked urine? Y   Do you have problems with loss of bowel control? N   Managing your Medications? N   Managing your Finances? N   Housekeeping or managing your Housekeeping? N     Patient Care Team: Norleen Lynwood ORN, MD as PCP - General Cammie Batters, OD as Referring Physician (Optometry)  I have updated your Care Teams any recent Medical Services you may have received from other providers in the past year.     Assessment:   This is a routine wellness examination for Kavaughn.  Hearing/Vision screen Hearing Screening - Comments:: Denies hearing difficulties   Vision Screening - Comments:: Wears eyeglasses/Dr. Cammie   Goals Addressed               This Visit's Progress      Patient Stated (pt-stated)   On track     To live another day.       Depression Screen     04/11/2024    1:41 PM 02/17/2024    1:04 PM 01/06/2024    2:23 PM 01/06/2024    2:01 PM 12/23/2023   11:27 AM 10/26/2023    1:39 PM 08/18/2023   11:27 AM  PHQ 2/9 Scores  PHQ - 2 Score 1 2 0 0 0 2 0  PHQ- 9 Score 6          Fall Risk     04/11/2024    1:38 PM 02/17/2024    1:04 PM 01/06/2024    2:11 PM 12/23/2023   11:26 AM 10/26/2023    1:39 PM  Fall Risk   Falls in the past year? 0 0 0 0 0  Number falls in past yr: 0 0 0  0  Injury with Fall? 0 0 0  0  Risk for fall due to :   No Fall Risks    Follow up Falls evaluation completed;Falls prevention discussed  Falls evaluation completed      MEDICARE RISK AT HOME:  Medicare Risk at Home Any stairs in or around the home?: Yes (2 story home) If so, are there any without handrails?: No Home free of loose throw rugs in walkways, pet beds, electrical cords,  etc?: Yes Adequate lighting in your home to reduce risk of falls?: Yes Life alert?: No Use of a cane, walker or w/c?: No Grab bars in the bathroom?: No Shower chair or bench in shower?: Yes Elevated toilet seat or a handicapped toilet?: Yes  TIMED UP AND GO:  Was the test performed?  No  Cognitive Function: Declined/Normal: No cognitive concerns noted by patient or family. Patient alert, oriented, able to answer questions appropriately and recall recent events. No signs of memory loss or confusion.        01/22/2022    1:32 PM  6CIT Screen  What Year? 0 points  What month? 0 points  What time? 0 points  Count back from 20 0 points  Months in reverse 0 points  Repeat phrase 0 points  Total Score 0 points    Immunizations Immunization History  Administered Date(s) Administered   Influenza Split 08/28/2011   Influenza, Seasonal, Injecte, Preservative Fre 07/09/2023   Influenza,inj,Quad PF,6+ Mos 07/28/2017, 07/12/2018, 05/05/2019, 07/14/2022   Influenza-Unspecified  07/20/2020   PFIZER(Purple Top)SARS-COV-2 Vaccination 10/13/2019, 11/07/2019, 05/28/2020, 01/14/2021   Tdap 01/21/2017    Screening Tests Health Maintenance  Topic Date Due   Pneumococcal Vaccine: 19-49 Years (1 of 2 - PCV) Never done   Pneumococcal Vaccine: 50+ Years (1 of 2 - PCV) Never done   Hepatitis B Vaccines (1 of 3 - 19+ 3-dose series) Never done   Zoster Vaccines- Shingrix (1 of 2) Never done   INFLUENZA VACCINE  04/01/2024   Fecal DNA (Cologuard)  08/06/2024   Medicare Annual Wellness (AWV)  04/11/2025   DTaP/Tdap/Td (2 - Td or Tdap) 01/22/2027   Hepatitis C Screening  Completed   HIV Screening  Completed   HPV VACCINES  Aged Out   Meningococcal B Vaccine  Aged Out   COVID-19 Vaccine  Discontinued    Health Maintenance  Health Maintenance Due  Topic Date Due   Pneumococcal Vaccine: 19-49 Years (1 of 2 - PCV) Never done   Pneumococcal Vaccine: 50+ Years (1 of 2 - PCV) Never done   Hepatitis B Vaccines (1 of 3 - 19+ 3-dose series) Never done   Zoster Vaccines- Shingrix (1 of 2) Never done   INFLUENZA VACCINE  04/01/2024   Health Maintenance Items Addressed: See Nurse Notes at the end of this note  Additional Screening:  Vision Screening: Recommended annual ophthalmology exams for early detection of glaucoma and other disorders of the eye. Would you like a referral to an eye doctor? No    Dental Screening: Recommended annual dental exams for proper oral hygiene  Community Resource Referral / Chronic Care Management: CRR required this visit?  No   CCM required this visit?  No   Plan:    I have personally reviewed and noted the following in the patient's chart:   Medical and social history Use of alcohol, tobacco or illicit drugs  Current medications and supplements including opioid prescriptions. Patient is not currently taking opioid prescriptions. Functional ability and status Nutritional status Physical activity Advanced directives List of other  physicians Hospitalizations, surgeries, and ER visits in previous 12 months Vitals Screenings to include cognitive, depression, and falls Referrals and appointments  In addition, I have reviewed and discussed with patient certain preventive protocols, quality metrics, and best practice recommendations. A written personalized care plan for preventive services as well as general preventive health recommendations were provided to patient.   Javani Spratt L Nadeen Shipman, CMA   04/11/2024   After Visit Summary: (  MyChart) Due to this being a telephonic visit, the after visit summary with patients personalized plan was offered to patient via MyChart   Notes: Patient is due for a Shingrix vaccine, a Hep B vaccine, PCV 13 and a PCV 20.  Patient would like to discuss during his up coming visit with Dr. Norleen in November.  He had no other concerns to discuss today.

## 2024-04-11 NOTE — Patient Instructions (Signed)
 Mr. Sorg , Thank you for taking time out of your busy schedule to complete your Annual Wellness Visit with me. I enjoyed our conversation and look forward to speaking with you again next year. I, as well as your care team,  appreciate your ongoing commitment to your health goals. Please review the following plan we discussed and let me know if I can assist you in the future. Your Game plan/ To Do List     Follow up Visits: We will see or speak with you next year for your Next Medicare AWV with our clinical staff Have you seen your provider in the last 6 months (3 months if uncontrolled diabetes)? Yes. Next OV on 07/08/2024.  Clinician Recommendations:  Aim for 30 minutes of exercise or brisk walking, 6-8 glasses of water , and 5 servings of fruits and vegetables each day. You are due for a Shingles vaccine and can get that at your pharmacy.  You are also due for Hep B vaccine, a Pneumococcal Vaccine: 19-49 Years and Pneumococcal Vaccine: 50+ Years and can get those during your office visit or at the pharmacy.        This is a list of the screenings recommended for you:  Health Maintenance  Topic Date Due   Pneumococcal Vaccine for high risk medical condition (1 of 2 - PCV) Never done   Pneumococcal Vaccine for age over 36 (1 of 2 - PCV) Never done   Hepatitis B Vaccine (1 of 3 - 19+ 3-dose series) Never done   Zoster (Shingles) Vaccine (1 of 2) Never done   Flu Shot  04/01/2024   Cologuard (Stool DNA test)  08/06/2024   Medicare Annual Wellness Visit  04/11/2025   DTaP/Tdap/Td vaccine (2 - Td or Tdap) 01/22/2027   Hepatitis C Screening  Completed   HIV Screening  Completed   HPV Vaccine  Aged Out   Meningitis B Vaccine  Aged Out   COVID-19 Vaccine  Discontinued    Advanced directives: (Declined) Advance directive discussed with you today. Even though you declined this today, please call our office should you change your mind, and we can give you the proper paperwork for you to fill  out. Advance Care Planning is important because it:  [x]  Makes sure you receive the medical care that is consistent with your values, goals, and preferences  [x]  It provides guidance to your family and loved ones and reduces their decisional burden about whether or not they are making the right decisions based on your wishes.  Follow the link provided in your after visit summary or read over the paperwork we have mailed to you to help you started getting your Advance Directives in place. If you need assistance in completing these, please reach out to us  so that we can help you!  See attachments for Preventive Care and Fall Prevention Tips.

## 2024-04-12 ENCOUNTER — Other Ambulatory Visit (INDEPENDENT_AMBULATORY_CARE_PROVIDER_SITE_OTHER)

## 2024-04-12 DIAGNOSIS — R300 Dysuria: Secondary | ICD-10-CM

## 2024-04-13 ENCOUNTER — Ambulatory Visit: Payer: Self-pay | Admitting: Internal Medicine

## 2024-04-13 LAB — URINALYSIS, ROUTINE W REFLEX MICROSCOPIC
Bilirubin Urine: NEGATIVE
Hgb urine dipstick: NEGATIVE
Ketones, ur: NEGATIVE
Leukocytes,Ua: NEGATIVE
Nitrite: NEGATIVE
Specific Gravity, Urine: 1.01 (ref 1.000–1.030)
Total Protein, Urine: 30 — AB
Urine Glucose: NEGATIVE
Urobilinogen, UA: 0.2 (ref 0.0–1.0)
pH: 6 (ref 5.0–8.0)

## 2024-04-13 LAB — URINE CULTURE
MICRO NUMBER:: 16819785
Result:: NO GROWTH
SPECIMEN QUALITY:: ADEQUATE

## 2024-04-14 ENCOUNTER — Other Ambulatory Visit: Payer: Self-pay | Admitting: Internal Medicine

## 2024-04-15 ENCOUNTER — Encounter: Attending: Registered Nurse | Admitting: Physical Medicine and Rehabilitation

## 2024-04-15 ENCOUNTER — Encounter: Payer: Self-pay | Admitting: Physical Medicine and Rehabilitation

## 2024-04-15 VITALS — BP 112/79 | HR 85 | Ht 67.0 in | Wt 271.8 lb

## 2024-04-15 DIAGNOSIS — M5442 Lumbago with sciatica, left side: Secondary | ICD-10-CM | POA: Insufficient documentation

## 2024-04-15 DIAGNOSIS — M5416 Radiculopathy, lumbar region: Secondary | ICD-10-CM | POA: Diagnosis not present

## 2024-04-15 DIAGNOSIS — G8929 Other chronic pain: Secondary | ICD-10-CM | POA: Insufficient documentation

## 2024-04-15 DIAGNOSIS — G894 Chronic pain syndrome: Secondary | ICD-10-CM | POA: Insufficient documentation

## 2024-04-15 DIAGNOSIS — M5441 Lumbago with sciatica, right side: Secondary | ICD-10-CM | POA: Diagnosis not present

## 2024-04-15 MED ORDER — DIAZEPAM 5 MG PO TABS: 5.0000 mg | ORAL_TABLET | Freq: Two times a day (BID) | ORAL | 0 refills | Status: AC

## 2024-04-15 MED ORDER — PREDNISONE 20 MG PO TABS: 20.0000 mg | ORAL_TABLET | Freq: Every day | ORAL | 0 refills | Status: AC

## 2024-04-15 MED ORDER — CYCLOBENZAPRINE HCL 10 MG PO TABS: 10.0000 mg | ORAL_TABLET | Freq: Every evening | ORAL | 5 refills | Status: AC | PRN

## 2024-04-15 MED ORDER — OXYCODONE-ACETAMINOPHEN 5-325 MG PO TABS: 1.0000 | ORAL_TABLET | Freq: Three times a day (TID) | ORAL | 0 refills | Status: DC | PRN

## 2024-04-15 MED ORDER — OXYCODONE-ACETAMINOPHEN 5-325 MG PO TABS: 1.0000 | ORAL_TABLET | Freq: Four times a day (QID) | ORAL | 0 refills | Status: DC | PRN

## 2024-04-15 NOTE — Patient Instructions (Signed)
 Pt is a 58 yr old male with hx of R shoulder pain due to complete supraspinatus tear HTN; no DM; HLD, BPH, on Flomax  and Prazosin; and allergies and low testosterone , depression/anxiety on Lorazepam /Cymbalta ; ;  Here for f/u of chronic low back pain due to lumbar radiculopathy.   Pyridium - or AZO- will help  treat the discomfort- for 2-3 days- it will turn your urine reddish orange-   2. If this doesn't treat the symptoms or they recur- then call me back and we will treat for yeast UTI-   3.   Con't Percocet 5/325- up to 3x/day as needed #90-  Sent in 2 Rx's-  added ICD 10 codes to Rx's  4.   We discussed Cyclobenzaprine  vs Seklaxin- will try Flexeril /Cyclobenzaprine   for now- and just see if it's helpful- 5-10 mg at bedtime- as needed.   5. Will do an Rx for Valium /Diazepam  5 mg 2x/day x 5 days and Prednisone  20 mg daily x 5 days- for when pain spikes/flares- only 2x/year  6.  Con't Duloxetine  from Dr Norleen-  7. F/U in 2 months with Fidela and 4 months for chronic pain.

## 2024-04-15 NOTE — Progress Notes (Signed)
 Subjective:    Patient ID: Darren Black, male    DOB: September 12, 1965, 58 y.o.   MRN: [id number]  HPI  Pt is a 58 yr old male with hx of R shoulder pain due to complete supraspinatus tear HTN; no DM; HLD, BPH, on Flomax  and Prazosin; and allergies and low testosterone , depression/anxiety on Lorazepam /Cymbalta ; ;  Here for f/u of chronic low back pain due to lumbar radiculopathy.   Was getting pain in L groin- stung when peed-  U/A (-) but it appears has yeast in urine. Cx was (-).    Brother developed prostate cancer- getting prostate removed.  Wife also dx'd with cervical cancer.    Hurting more now- due to rainy weather-  Wife tries to keep him moving.  Weather just killing him.    New thing that's occurring-  Tried Flexeril - made him feel funny.  But got used to it- kinda of helped him.  Muscled through it.      Pain Inventory Average Pain 9 Pain Right Now 9 My pain is sharp, dull, stabbing, aching, and weather related pain  In the last 24 hours, has pain interfered with the following? General activity 10 Relation with others 8 Enjoyment of life 9 What TIME of day is your pain at its worst? morning , daytime, evening, and night Sleep (in general) Poor  Pain is worse with: walking, bending, sitting, standing, and some activites Pain improves with: medication Relief from Meds: 1  Family History  Problem Relation Age of Onset   Coronary artery disease Father    Cancer Father        anaplastic thyroid  cancer   Stroke Other        1st degree relative   Diabetes Other        1st degree relative   Hypertension Other    Hyperlipidemia Other    Social History   Socioeconomic History   Marital status: Married    Spouse name: Kallie   Number of children: Not on file   Years of education: Not on file   Highest education level: Not on file  Occupational History   Occupation: disabled, LBP and anxiety  Tobacco Use   Smoking status: Never   Smokeless  tobacco: Never  Vaping Use   Vaping status: Never Used  Substance and Sexual Activity   Alcohol use: Not Currently   Drug use: No   Sexual activity: Not on file  Other Topics Concern   Not on file  Social History Narrative   Live with wife/2025   Social Drivers of Health   Financial Resource Strain: High Risk (04/11/2024)   Overall Financial Resource Strain (CARDIA)    Difficulty of Paying Living Expenses: Very hard  Food Insecurity: No Food Insecurity (04/11/2024)   Hunger Vital Sign    Worried About Running Out of Food in the Last Year: Never true    Ran Out of Food in the Last Year: Never true  Transportation Needs: No Transportation Needs (04/11/2024)   PRAPARE - Administrator, Civil Service (Medical): No    Lack of Transportation (Non-Medical): No  Physical Activity: Inactive (04/11/2024)   Exercise Vital Sign    Days of Exercise per Week: 0 days    Minutes of Exercise per Session: 0 min  Stress: No Stress Concern Present (04/11/2024)   [employer] of Occupational Health - Occupational Stress Questionnaire    Feeling of Stress: Only a little  Social Connections: Socially Isolated (04/11/2024)  Social Connection and Isolation Panel    Frequency of Communication with Friends and Family: Once a week    Frequency of Social Gatherings with Friends and Family: Once a week    Attends Religious Services: Never    Database administrator or Organizations: No    Attends Engineer, structural: Never    Marital Status: Married   Past Surgical History:  Procedure Laterality Date   APPENDECTOMY  2003   BREATH SHIRLEAN VEAR LORA  04/27/2012   Procedure: BREATH TEK H PYLORI;  Surgeon: Donnice KATHEE Lunger, MD;  Location: [facility];  Service: General;  Laterality: N/A;   CHOLECYSTECTOMY N/A 03/24/2019   Procedure: LAPAROSCOPIC CHOLECYSTECTOMY WITH INTRAOPERATIVE CHOLANGIOGRAM;  Surgeon: Belinda Donnice, MD;  [facility];  Service: General;  Laterality: N/A;    CYSTOSCOPY W/ URETERAL STENT PLACEMENT Left 05/13/2023   Procedure: CYSTOSCOPY WITH RETROGRADE PYELOGRAM/URETERAL STENT PLACEMENT AND INDOCYANINE GREEN DYE INJECTION;  Surgeon: Alvaro Ricardo KATHEE Mickey., MD;  Location: [facility];  Service: Urology;  Laterality: Left;   CYSTOSCOPY WITH URETHRAL DILATATION N/A 04/12/2015   Procedure: CYSTOSCOPY WITH URETHRAL DILATATION, RETROGRADE AND URETHROGRAM WITH BLADDER BIOPSY;  Surgeon: Darren Seltzer, MD;  Location: [facility];  Service: Urology;  Laterality: N/A;   ROBOTIC ASSISTED LAPAROSCOPIC BLADDER DIVERTICULECTOMY N/A 05/13/2023   Procedure: XI ROBOTIC ASSISTED LAPAROSCOPIC PARTIAL CYSTECTOMY, OPNE UMBILICAL HERNIA REPAIR;  Surgeon: Alvaro Ricardo KATHEE Mickey., MD;  Location: [facility];  Service: Urology;  Laterality: N/A;   ROTATOR CUFF REPAIR Right 01-21-2012   TRANSTHORACIC ECHOCARDIOGRAM  03-26-2007   normal LVSF, ef 60%,  mild AV calcification without stenosis,  mild MR and TR,  mild LAE   URETHROGRAM N/A 04/12/2015   Procedure: URETHROGRAM;  Surgeon: Darren Seltzer, MD;  Location: [facility];  Service: Urology;  Laterality: N/A;   VENTRAL HERNIA REPAIR N/A 10/06/2023   Procedure: LAPAROSCOPIC VENTRAL HERNIA repair with mesh;  Surgeon: Signe Mitzie LABOR, MD;  Location: [facility];  Service: General;  Laterality: N/A;   Past Surgical History:  Procedure Laterality Date   APPENDECTOMY  2003   BREATH TEK H PYLORI  04/27/2012   Procedure: BREATH TEK H PYLORI;  Surgeon: Donnice KATHEE Lunger, MD;  Location: [facility];  Service: General;  Laterality: N/A;   CHOLECYSTECTOMY N/A 03/24/2019   Procedure: LAPAROSCOPIC CHOLECYSTECTOMY WITH INTRAOPERATIVE CHOLANGIOGRAM;  Surgeon: Belinda Donnice, MD;  [facility];  Service: General;  Laterality: N/A;   CYSTOSCOPY W/ URETERAL STENT PLACEMENT Left 05/13/2023   Procedure: CYSTOSCOPY WITH RETROGRADE PYELOGRAM/URETERAL STENT PLACEMENT AND INDOCYANINE GREEN DYE INJECTION;  Surgeon: Alvaro Ricardo KATHEE Mickey., MD;   [facility];  Service: Urology;  Laterality: Left;   CYSTOSCOPY WITH URETHRAL DILATATION N/A 04/12/2015   Procedure: CYSTOSCOPY WITH URETHRAL DILATATION, RETROGRADE AND URETHROGRAM WITH BLADDER BIOPSY;  Surgeon: Darren Seltzer, MD;  Location: [facility];  Service: Urology;  Laterality: N/A;   ROBOTIC ASSISTED LAPAROSCOPIC BLADDER DIVERTICULECTOMY N/A 05/13/2023   Procedure: XI ROBOTIC ASSISTED LAPAROSCOPIC PARTIAL CYSTECTOMY, OPNE UMBILICAL HERNIA REPAIR;  Surgeon: Alvaro Ricardo KATHEE Mickey., MD;  [facility];  Service: Urology;  Laterality: N/A;   ROTATOR CUFF REPAIR Right 01-21-2012   TRANSTHORACIC ECHOCARDIOGRAM  03-26-2007   normal LVSF, ef 60%,  mild AV calcification without stenosis,  mild MR and TR,  mild LAE   URETHROGRAM N/A 04/12/2015   Procedure: URETHROGRAM;  Surgeon: Darren Seltzer, MD;  Location: [facility];  Service: Urology;  Laterality: N/A;   VENTRAL HERNIA  REPAIR N/A 10/06/2023   Procedure: LAPAROSCOPIC VENTRAL HERNIA repair with mesh;  Surgeon: Signe Mitzie LABOR, MD;  [facility];  Service: General;  Laterality: N/A;   Past Medical History:  Diagnosis Date   Anxiety    BPH (benign prostatic hypertrophy)    Chronic lower back pain    nerve damage w/ leg pain   Depression    Diarrhea 03/23/2019   Family history of adverse reaction to anesthesia    MOTHER--- HARD TO WAKE   GERD (gastroesophageal reflux disease)    History of panic attacks    Hyperlipidemia    Hypertension    Meatal stenosis    OSA on CPAP    moderate to severe OSA  per study 04-28-2007 uses CPAP   PONV (postoperative nausea and vomiting)    Hard to wake up   BP 112/79 (BP Location: Left Arm, Patient Position: Sitting, Cuff Size: Large)   Pulse 85   Ht 5' 7 (1.702 m)   Wt 271 lb 12.8 oz (123.3 kg)   SpO2 95%   BMI 42.57 kg/m   Opioid Risk Score:   Fall Risk Score:  `1  Depression screen PHQ 2/9     04/11/2024    1:41 PM 02/17/2024    1:04 PM 01/06/2024     2:23 PM 01/06/2024    2:01 PM 12/23/2023   11:27 AM 10/26/2023    1:39 PM 08/18/2023   11:27 AM  Depression screen PHQ 2/9  Decreased Interest 0 1 0 0 0 1 0  Down, Depressed, Hopeless 1 1 0 0 0 1 0  PHQ - 2 Score 1 2 0 0 0 2 0  Altered sleeping 1        Tired, decreased energy 1        Change in appetite 0        Feeling bad or failure about yourself  0        Trouble concentrating 3        Moving slowly or fidgety/restless 0        Suicidal thoughts 0        PHQ-9 Score 6        Difficult doing work/chores Not difficult at all            Review of Systems  Musculoskeletal:  Positive for back pain, myalgias and neck pain.       Neck pain, mid to lower back pain, right hip pain, bilateral knee pain  All other systems reviewed and are negative.      Objective:   Physical Exam  Awake, alert, appropriate accompanied by wife, NAD Abd protuberant TTP in band across low back       Assessment & Plan:   Pt is a 58 yr old male with hx of R shoulder pain due to complete supraspinatus tear HTN; no DM; HLD, BPH, on Flomax  and Prazosin; and allergies and low testosterone , depression/anxiety on Lorazepam /Cymbalta ; ;  Here for f/u of chronic low back pain due to lumbar radiculopathy.   Pyridium - or AZO- will help  treat the discomfort- for 2-3 days- it will turn your urine reddish orange-   2. If this doesn't treat the symptoms or they recur- then call me back and we will treat for yeast UTI-   3.   Con't Percocet 5/325- up to 3x/day as needed #90-  Sent in 2 Rx's-  added ICD 10 codes to Rx's  4.   We discussed Cyclobenzaprine  vs  Seklaxin- will try Flexeril /Cyclobenzaprine   for now- and just see if it's helpful- 5-10 mg at bedtime- as needed.   5. Will do an Rx for Valium /Diazepam  5 mg 2x/day x 5 days and Prednisone  20 mg daily x 5 days- for when pain spikes/flares- only 2x/year  6.  Con't Duloxetine  from Dr Darren-  7. F/U in 2 months with Fidela and 4 months for chronic pain.     I spent a total of  42   minutes on total care today- >50% coordination of care- due to d/w pt about pain flares- and how to treat. As detailed above

## 2024-04-19 ENCOUNTER — Telehealth: Payer: Self-pay | Admitting: Physical Medicine and Rehabilitation

## 2024-04-19 NOTE — Telephone Encounter (Signed)
 The over the counter medication helped a little bit, but since he stopped taking it, his bladder issue is back.  Dr. Lovorn had mentioned sending in a prescription for something else.

## 2024-04-21 NOTE — Telephone Encounter (Signed)
 Called pt and wife- wife answered- we discussed still having Sx's- I spoke with ID doc- who suggested milking the prostate to see if has prostatitis or and rechecking U/A and Cx If that's negative, call Urology and get checked for other male infections that can occur.   They voiced understanding

## 2024-05-05 DIAGNOSIS — G4733 Obstructive sleep apnea (adult) (pediatric): Secondary | ICD-10-CM | POA: Diagnosis not present

## 2024-05-24 ENCOUNTER — Encounter: Payer: Self-pay | Admitting: *Deleted

## 2024-05-24 NOTE — Progress Notes (Signed)
 Marcelis Wissner                                          MRN: [id number]   05/24/2024   The [facility] Quality Team Specialist reviewed this patient medical record for the purposes of chart review for care gap closure. The following were reviewed: chart review for care gap closure-colorectal cancer screening and diabetic eye exam.    [facility] Quality Team

## 2024-05-24 NOTE — Progress Notes (Signed)
 Darren Black                                          MRN: [id number]   05/24/2024   The [facility] Quality Team Specialist reviewed this patient medical record for the purposes of chart review for care gap closure. The following were reviewed: abstraction for care gap closure-controlling blood pressure.    [facility] Quality Team

## 2024-05-29 ENCOUNTER — Other Ambulatory Visit: Payer: Self-pay | Admitting: Internal Medicine

## 2024-06-08 ENCOUNTER — Other Ambulatory Visit: Payer: Self-pay

## 2024-06-08 ENCOUNTER — Other Ambulatory Visit: Payer: Self-pay | Admitting: Internal Medicine

## 2024-06-09 DIAGNOSIS — E569 Vitamin deficiency, unspecified: Secondary | ICD-10-CM | POA: Diagnosis not present

## 2024-06-09 DIAGNOSIS — E88819 Insulin resistance, unspecified: Secondary | ICD-10-CM | POA: Diagnosis not present

## 2024-06-09 DIAGNOSIS — E618 Deficiency of other specified nutrient elements: Secondary | ICD-10-CM | POA: Diagnosis not present

## 2024-06-09 DIAGNOSIS — E291 Testicular hypofunction: Secondary | ICD-10-CM | POA: Diagnosis not present

## 2024-06-09 DIAGNOSIS — Z79899 Other long term (current) drug therapy: Secondary | ICD-10-CM | POA: Diagnosis not present

## 2024-06-09 DIAGNOSIS — R972 Elevated prostate specific antigen [PSA]: Secondary | ICD-10-CM | POA: Diagnosis not present

## 2024-06-10 ENCOUNTER — Other Ambulatory Visit: Payer: Self-pay

## 2024-06-10 ENCOUNTER — Other Ambulatory Visit: Payer: Self-pay | Admitting: Internal Medicine

## 2024-06-13 NOTE — Progress Notes (Unsigned)
 Subjective:    Patient ID: Darren Black, male    DOB: December 13, 1965, 58 y.o.   MRN: [id number]  HPI: Darren Black is a 58 y.o. male who returns for follow up appointment for chronic pain and medication refill.He  states his pain is located in his neck, mid- lower back and bilateral lower extremities with tingling. He  rates his pain 9. His current exercise regime is walking and performing stretching exercises.  Darren Black  equivalent is 22.50 MME.   Oral Swab was Performed today.     Pain Inventory Average Pain 9 Pain Right Now 9 My pain is sharp, dull, stabbing, and aching  In the last 24 hours, has pain interfered with the following? General activity 9 Relation with others 9 Enjoyment of life 9 What TIME of day is your pain at its worst? morning , daytime, evening, and night Sleep (in general) Poor  Pain is worse with: walking, bending, sitting, inactivity, standing, and some activites Pain improves with: medication Relief from Meds: 1  Family History  Problem Relation Age of Onset  . Coronary artery disease Father   . Cancer Father        anaplastic thyroid  cancer  . Stroke Other        1st degree relative  . Diabetes Other        1st degree relative  . Hypertension Other   . Hyperlipidemia Other    Social History   Socioeconomic History  . Marital status: Married    Spouse name: Kallie  . Number of children: Not on file  . Years of education: Not on file  . Highest education level: Not on file  Occupational History  . Occupation: disabled, LBP and anxiety  Tobacco Use  . Smoking status: Never  . Smokeless tobacco: Never  Vaping Use  . Vaping status: Never Used  Substance and Sexual Activity  . Alcohol use: Not Currently  . Drug use: No  . Sexual activity: Not on file  Other Topics Concern  . Not on file  Social History Narrative   Live with wife/2025   Social Drivers of Health   Financial Resource Strain: High Risk (04/11/2024)   Overall  Financial Resource Strain (CARDIA)   . Difficulty of Paying Living Expenses: Very hard  Food Insecurity: No Food Insecurity (04/11/2024)   Hunger Vital Sign   . Worried About Programme researcher, broadcasting/film/video in the Last Year: Never true   . Ran Out of Food in the Last Year: Never true  Transportation Needs: No Transportation Needs (04/11/2024)   PRAPARE - Transportation   . Lack of Transportation (Medical): No   . Lack of Transportation (Non-Medical): No  Physical Activity: Inactive (04/11/2024)   Exercise Vital Sign   . Days of Exercise per Week: 0 days   . Minutes of Exercise per Session: 0 min  Stress: No Stress Concern Present (04/11/2024)   [employer] of Occupational Health - Occupational Stress Questionnaire   . Feeling of Stress: Only a little  Social Connections: Socially Isolated (04/11/2024)   Social Connection and Isolation Panel   . Frequency of Communication with Friends and Family: Once a week   . Frequency of Social Gatherings with Friends and Family: Once a week   . Attends Religious Services: Never   . Active Member of Clubs or Organizations: No   . Attends Banker Meetings: Never   . Marital Status: Married   Past Surgical History:  Procedure Laterality Date  .  APPENDECTOMY  2003  . BREATH TEK H PYLORI  04/27/2012   Procedure: BREATH TEK H PYLORI;  Surgeon: Donnice KATHEE Lunger, MD;  Location: [facility];  Service: General;  Laterality: N/A;  . CHOLECYSTECTOMY N/A 03/24/2019   Procedure: LAPAROSCOPIC CHOLECYSTECTOMY WITH INTRAOPERATIVE CHOLANGIOGRAM;  Surgeon: Belinda Donnice, MD;  Location: [facility];  Service: General;  Laterality: N/A;  . CYSTOSCOPY W/ URETERAL STENT PLACEMENT Left 05/13/2023   Procedure: CYSTOSCOPY WITH RETROGRADE PYELOGRAM/URETERAL STENT PLACEMENT AND INDOCYANINE GREEN DYE INJECTION;  Surgeon: Alvaro Ricardo KATHEE Mickey., MD;  Location: [facility];  Service: Urology;  Laterality: Left;  . CYSTOSCOPY WITH URETHRAL DILATATION N/A 04/12/2015   Procedure:  CYSTOSCOPY WITH URETHRAL DILATATION, RETROGRADE AND URETHROGRAM WITH BLADDER BIOPSY;  Surgeon: Darren Seltzer, MD;  Location: [facility];  Service: Urology;  Laterality: N/A;  . ROBOTIC ASSISTED LAPAROSCOPIC BLADDER DIVERTICULECTOMY N/A 05/13/2023   Procedure: XI ROBOTIC ASSISTED LAPAROSCOPIC PARTIAL CYSTECTOMY, OPNE UMBILICAL HERNIA REPAIR;  Surgeon: Alvaro Ricardo KATHEE Mickey., MD;  Location: [facility];  Service: Urology;  Laterality: N/A;  . ROTATOR CUFF REPAIR Right 01-21-2012  . TRANSTHORACIC ECHOCARDIOGRAM  03-26-2007   normal LVSF, ef 60%,  mild AV calcification without stenosis,  mild MR and TR,  mild LAE  . URETHROGRAM N/A 04/12/2015   Procedure: URETHROGRAM;  Surgeon: Darren Seltzer, MD;  Location: [facility];  Service: Urology;  Laterality: N/A;  . VENTRAL HERNIA REPAIR N/A 10/06/2023   Procedure: LAPAROSCOPIC VENTRAL HERNIA repair with mesh;  Surgeon: Signe Mitzie LABOR, MD;  Location: [facility];  Service: General;  Laterality: N/A;   Past Surgical History:  Procedure Laterality Date  . APPENDECTOMY  2003  . BREATH TEK H PYLORI  04/27/2012   Procedure: BREATH TEK H PYLORI;  Surgeon: Donnice KATHEE Lunger, MD;  Location: [facility];  Service: General;  Laterality: N/A;  . CHOLECYSTECTOMY N/A 03/24/2019   Procedure: LAPAROSCOPIC CHOLECYSTECTOMY WITH INTRAOPERATIVE CHOLANGIOGRAM;  Surgeon: Belinda Donnice, MD;  Location[facility];  Service: General;  Laterality: N/A;  . CYSTOSCOPY W/ URETERAL STENT PLACEMENT Left 05/13/2023   Procedure: CYSTOSCOPY WITH RETROGRADE PYELOGRAM/URETERAL STENT PLACEMENT AND INDOCYANINE GREEN DYE INJECTION;  Surgeon: Alvaro Ricardo KATHEE Mickey., MD;  Location: [facility];  Service: Urology;  Laterality: Left;  . CYSTOSCOPY WITH URETHRAL DILATATION N/A 04/12/2015   Procedure: CYSTOSCOPY WITH URETHRAL DILATATION, RETROGRADE AND URETHROGRAM WITH BLADDER BIOPSY;  Surgeon: Darren Seltzer, MD;  Location: [facility];  Service: Urology;  Laterality: N/A;  . ROBOTIC  ASSISTED LAPAROSCOPIC BLADDER DIVERTICULECTOMY N/A 05/13/2023   Procedure: XI ROBOTIC ASSISTED LAPAROSCOPIC PARTIAL CYSTECTOMY, OPNE UMBILICAL HERNIA REPAIR;  Surgeon: Alvaro Ricardo KATHEE Mickey., MD;  Location: [facility];  Service: Urology;  Laterality: N/A;  . ROTATOR CUFF REPAIR Right 01-21-2012  . TRANSTHORACIC ECHOCARDIOGRAM  03-26-2007   normal LVSF, ef 60%,  mild AV calcification without stenosis,  mild MR and TR,  mild LAE  . URETHROGRAM N/A 04/12/2015   Procedure: URETHROGRAM;  Surgeon: Darren Seltzer, MD;  Location[facility];  Service: Urology;  Laterality: N/A;  . VENTRAL HERNIA REPAIR N/A 10/06/2023   Procedure: LAPAROSCOPIC VENTRAL HERNIA repair with mesh;  Surgeon: Signe Mitzie LABOR, MD;  Location: [facility];  Service: General;  Laterality: N/A;   Past Medical History:  Diagnosis Date  . Anxiety   . BPH (benign prostatic hypertrophy)   . Chronic lower back pain    nerve damage w/ leg pain  . Depression   . Diarrhea 03/23/2019  . Family history of adverse  reaction to anesthesia    MOTHER--- HARD TO WAKE  . GERD (gastroesophageal reflux disease)   . History of panic attacks   . Hyperlipidemia   . Hypertension   . Meatal stenosis   . OSA on CPAP    moderate to severe OSA  per study 04-28-2007 uses CPAP  . PONV (postoperative nausea and vomiting)    Hard to wake up   There were no vitals taken for this visit.  Opioid Risk Score:   Fall Risk Score:  `1  Depression screen PHQ 2/9     04/11/2024    1:41 PM 02/17/2024    1:04 PM 01/06/2024    2:23 PM 01/06/2024    2:01 PM 12/23/2023   11:27 AM 10/26/2023    1:39 PM 08/18/2023   11:27 AM  Depression screen PHQ 2/9  Decreased Interest 0 1 0 0 0 1 0  Down, Depressed, Hopeless 1 1 0 0 0 1 0  PHQ - 2 Score 1 2 0 0 0 2 0  Altered sleeping 1        Tired, decreased energy 1        Change in appetite 0        Feeling bad or failure about yourself  0        Trouble concentrating 3        Moving slowly or fidgety/restless 0         Suicidal thoughts 0        PHQ-9 Score 6        Difficult doing work/chores Not difficult at all           Review of Systems  Constitutional: Negative.   HENT: Negative.    Eyes: Negative.   Respiratory: Negative.    Cardiovascular: Negative.   Gastrointestinal: Negative.   Endocrine: Negative.   Genitourinary: Negative.   Musculoskeletal:  Positive for back pain and neck pain.  Skin: Negative.   Allergic/Immunologic: Negative.   Neurological: Negative.   Hematological: Negative.   Psychiatric/Behavioral: Negative.         Objective:   Physical Exam Vitals and nursing note reviewed.  Constitutional:      Appearance: Normal appearance. He is obese.  Neck:     Comments: Cervical Paraspinal Tenderness: C-5-C-6 Cardiovascular:     Rate and Rhythm: Normal rate and regular rhythm.     Pulses: Normal pulses.     Heart sounds: Normal heart sounds.  Pulmonary:     Effort: Pulmonary effort is normal.     Breath sounds: Normal breath sounds.  Musculoskeletal:     Comments: Normal Muscle Bulk and Muscle Testing Reveals:  Upper Extremities: Full ROM and Muscle Strength 5/5  Thoracic Paraspinal Tenderness: T-7-T-10 Lumbar Paraspinal Tenderness: L-3-L-5 Bilateral Greater Trochanter Tendenress Lower Extremities: Full ROM and Muscle Strength 5/5 Arises from Table Slowly Narrow Based  Gait     Skin:    General: Skin is warm and dry.  Neurological:     Mental Status: He is alert and oriented to person, place, and time.  Psychiatric:        Mood and Affect: Mood normal.        Behavior: Behavior normal.          Assessment & Plan:  Cervicalgia/ Cervical Radiculitis: Continue HEP as Tolerated and Continue current medication regimen. 06/14/2024 Chronic Low Back Pain without Sciatica/ Lumbar Radiculitis: Continue HEP as Tolerated. Continue current medication regimen. Continue to Monitor. 06/14/2024 Chronic Bilateral Thoracic Pain:  Continue HEP as Tolerated. Continue to  Monitor. 06/14/2024 Depression/ Anxiety: PCP Following. Continue current Medication Regimen. Continue to Monitor. 06/14/2024 Chronic Pain Syndrome: Refilled: Oxycodone  5/325 mg one tablet every 8 hours as needed for pain #90.Second script sent for the following month. .We will continue the opioid monitoring program, this consists of regular clinic visits, examinations, urine drug screen, pill counts as well as use of [place]  Controlled Substance Reporting system. A 12 month History has been reviewed on the   Controlled Substance Reporting System on 06/14/2024.    6. Bilateral Neuropathic Pain to Lower Extremities: Continue current medication regimen. Continue to Monitor.   F/U in 2  months.

## 2024-06-14 ENCOUNTER — Encounter: Payer: Self-pay | Admitting: Registered Nurse

## 2024-06-14 ENCOUNTER — Encounter: Attending: Registered Nurse | Admitting: Registered Nurse

## 2024-06-14 VITALS — BP 137/87 | HR 80 | Ht 66.0 in | Wt 272.6 lb

## 2024-06-14 DIAGNOSIS — M546 Pain in thoracic spine: Secondary | ICD-10-CM | POA: Diagnosis not present

## 2024-06-14 DIAGNOSIS — G8929 Other chronic pain: Secondary | ICD-10-CM | POA: Diagnosis not present

## 2024-06-14 DIAGNOSIS — Z79891 Long term (current) use of opiate analgesic: Secondary | ICD-10-CM | POA: Diagnosis not present

## 2024-06-14 DIAGNOSIS — M542 Cervicalgia: Secondary | ICD-10-CM | POA: Diagnosis not present

## 2024-06-14 DIAGNOSIS — M545 Low back pain, unspecified: Secondary | ICD-10-CM | POA: Diagnosis not present

## 2024-06-14 DIAGNOSIS — G894 Chronic pain syndrome: Secondary | ICD-10-CM | POA: Insufficient documentation

## 2024-06-14 DIAGNOSIS — Z5181 Encounter for therapeutic drug level monitoring: Secondary | ICD-10-CM | POA: Diagnosis not present

## 2024-06-14 DIAGNOSIS — G5793 Unspecified mononeuropathy of bilateral lower limbs: Secondary | ICD-10-CM | POA: Insufficient documentation

## 2024-06-14 MED ORDER — OXYCODONE-ACETAMINOPHEN 5-325 MG PO TABS
1.0000 | ORAL_TABLET | Freq: Four times a day (QID) | ORAL | 0 refills | Status: AC | PRN
Start: 2024-06-14 — End: ?

## 2024-06-14 MED ORDER — OXYCODONE-ACETAMINOPHEN 5-325 MG PO TABS: 1.0000 | ORAL_TABLET | Freq: Four times a day (QID) | ORAL | 0 refills | Status: DC | PRN

## 2024-06-17 LAB — DRUG TOX MONITOR 1 W/CONF, ORAL FLD
AMINOCLONAZEPAM: NEGATIVE ng/mL (ref <0.50)
Alprazolam: NEGATIVE ng/mL (ref <0.50)
Amphetamines: NEGATIVE ng/mL (ref <10)
Barbiturates: NEGATIVE ng/mL (ref <10)
Benzodiazepines: POSITIVE ng/mL — AB (ref <0.50)
Buprenorphine: NEGATIVE ng/mL (ref <0.10)
Chlordiazepoxide: NEGATIVE ng/mL (ref <0.50)
Clonazepam: NEGATIVE ng/mL (ref <0.50)
Cocaine: NEGATIVE ng/mL (ref <5.0)
Codeine: NEGATIVE ng/mL (ref <2.5)
Diazepam: NEGATIVE ng/mL (ref <0.50)
Dihydrocodeine: NEGATIVE ng/mL (ref <2.5)
Fentanyl: NEGATIVE ng/mL (ref <0.10)
Flunitrazepam: NEGATIVE ng/mL (ref <0.50)
Flurazepam: NEGATIVE ng/mL (ref <0.50)
Heroin Metabolite: NEGATIVE ng/mL (ref <1.0)
Hydrocodone: NEGATIVE ng/mL (ref <2.5)
Hydromorphone: NEGATIVE ng/mL (ref <2.5)
Lorazepam: 5.39 ng/mL — ABNORMAL HIGH (ref <0.50)
MARIJUANA: NEGATIVE ng/mL (ref <2.5)
MDMA: NEGATIVE ng/mL (ref <10)
Meprobamate: NEGATIVE ng/mL (ref <2.5)
Methadone: NEGATIVE ng/mL (ref <5.0)
Midazolam: NEGATIVE ng/mL (ref <0.50)
Morphine: NEGATIVE ng/mL (ref <2.5)
Nicotine Metabolite: NEGATIVE ng/mL (ref <5.0)
Nordiazepam: NEGATIVE ng/mL (ref <0.50)
Norhydrocodone: NEGATIVE ng/mL (ref <2.5)
Noroxycodone: 12.7 ng/mL — ABNORMAL HIGH (ref <2.5)
Opiates: POSITIVE ng/mL — AB (ref <2.5)
Oxazepam: NEGATIVE ng/mL (ref <0.50)
Oxycodone: 22.3 ng/mL — ABNORMAL HIGH (ref <2.5)
Oxymorphone: NEGATIVE ng/mL (ref <2.5)
Phencyclidine: NEGATIVE ng/mL (ref <10)
Tapentadol: NEGATIVE ng/mL (ref <5.0)
Temazepam: NEGATIVE ng/mL (ref <0.50)
Tramadol: NEGATIVE ng/mL (ref <5.0)
Triazolam: NEGATIVE ng/mL (ref <0.50)
Zolpidem: NEGATIVE ng/mL (ref <5.0)

## 2024-06-17 LAB — DRUG TOX ALC METAB W/CON, ORAL FLD: Alcohol Metabolite: NEGATIVE ng/mL (ref <25)

## 2024-06-27 ENCOUNTER — Telehealth: Payer: Self-pay

## 2024-06-27 DIAGNOSIS — R739 Hyperglycemia, unspecified: Secondary | ICD-10-CM

## 2024-06-27 DIAGNOSIS — E7800 Pure hypercholesterolemia, unspecified: Secondary | ICD-10-CM

## 2024-06-27 DIAGNOSIS — R972 Elevated prostate specific antigen [PSA]: Secondary | ICD-10-CM

## 2024-06-27 NOTE — Addendum Note (Signed)
 Addended by: NORLEEN LYNWOOD ORN on: 06/27/2024 04:21 PM   Modules accepted: Orders

## 2024-06-27 NOTE — Telephone Encounter (Signed)
 Copied from CRM #[id number]. Topic: Clinical - Request for Lab/Test Order >> Jun 27, 2024 12:54 PM Charolett L wrote: Reason for CRM: patient is request lab orders to come in on 10/31

## 2024-06-27 NOTE — Telephone Encounter (Addendum)
 Ok the lab orders are done

## 2024-06-28 DIAGNOSIS — E291 Testicular hypofunction: Secondary | ICD-10-CM | POA: Diagnosis not present

## 2024-07-01 ENCOUNTER — Other Ambulatory Visit (INDEPENDENT_AMBULATORY_CARE_PROVIDER_SITE_OTHER)

## 2024-07-01 DIAGNOSIS — R972 Elevated prostate specific antigen [PSA]: Secondary | ICD-10-CM | POA: Diagnosis not present

## 2024-07-01 DIAGNOSIS — R739 Hyperglycemia, unspecified: Secondary | ICD-10-CM

## 2024-07-01 DIAGNOSIS — E7800 Pure hypercholesterolemia, unspecified: Secondary | ICD-10-CM | POA: Diagnosis not present

## 2024-07-01 LAB — BASIC METABOLIC PANEL WITH GFR
BUN: 13 mg/dL (ref 6–23)
CO2: 31 meq/L (ref 19–32)
Calcium: 9.1 mg/dL (ref 8.4–10.5)
Chloride: 99 meq/L (ref 96–112)
Creatinine, Ser: 1.65 mg/dL — ABNORMAL HIGH (ref 0.40–1.50)
GFR: 45.86 mL/min — ABNORMAL LOW (ref >60.00)
Glucose, Bld: 74 mg/dL (ref 70–99)
Potassium: 3.8 meq/L (ref 3.5–5.1)
Sodium: 139 meq/L (ref 135–145)

## 2024-07-01 LAB — HEPATIC FUNCTION PANEL
ALT: 23 U/L (ref 0–53)
AST: 21 U/L (ref 0–37)
Albumin: 4.3 g/dL (ref 3.5–5.2)
Alkaline Phosphatase: 43 U/L (ref 39–117)
Bilirubin, Direct: 0.2 mg/dL (ref 0.0–0.3)
Total Bilirubin: 0.8 mg/dL (ref 0.2–1.2)
Total Protein: 6.6 g/dL (ref 6.0–8.3)

## 2024-07-01 LAB — CBC WITH DIFFERENTIAL/PLATELET
Basophils Absolute: 0 K/uL (ref 0.0–0.1)
Basophils Relative: 0.6 % (ref 0.0–3.0)
Eosinophils Absolute: 0.2 K/uL (ref 0.0–0.7)
Eosinophils Relative: 2.6 % (ref 0.0–5.0)
HCT: 51.1 % (ref 39.0–52.0)
Hemoglobin: 17.3 g/dL — ABNORMAL HIGH (ref 13.0–17.0)
Lymphocytes Relative: 18 % (ref 12.0–46.0)
Lymphs Abs: 1.1 K/uL (ref 0.7–4.0)
MCHC: 33.9 g/dL (ref 30.0–36.0)
MCV: 82.7 fl (ref 78.0–100.0)
Monocytes Absolute: 0.6 K/uL (ref 0.1–1.0)
Monocytes Relative: 9.7 % (ref 3.0–12.0)
Neutro Abs: 4.3 K/uL (ref 1.4–7.7)
Neutrophils Relative %: 69.1 % (ref 43.0–77.0)
Platelets: 177 K/uL (ref 150.0–400.0)
RBC: 6.18 Mil/uL — ABNORMAL HIGH (ref 4.22–5.81)
RDW: 13.6 % (ref 11.5–15.5)
WBC: 6.2 K/uL (ref 4.0–10.5)

## 2024-07-01 LAB — LIPID PANEL
Cholesterol: 115 mg/dL (ref 0–200)
HDL: 39.8 mg/dL (ref >39.00)
LDL Cholesterol: 60 mg/dL (ref 0–99)
NonHDL: 75.19
Total CHOL/HDL Ratio: 3
Triglycerides: 76 mg/dL (ref 0.0–149.0)
VLDL: 15.2 mg/dL (ref 0.0–40.0)

## 2024-07-01 LAB — PSA: PSA: 2.89 ng/mL (ref 0.10–4.00)

## 2024-07-01 LAB — HEMOGLOBIN A1C: Hgb A1c MFr Bld: 5.3 % (ref 4.6–6.5)

## 2024-07-04 ENCOUNTER — Other Ambulatory Visit: Payer: Self-pay | Admitting: Internal Medicine

## 2024-07-04 ENCOUNTER — Other Ambulatory Visit: Payer: Self-pay

## 2024-07-08 ENCOUNTER — Encounter: Payer: Self-pay | Admitting: Internal Medicine

## 2024-07-08 ENCOUNTER — Ambulatory Visit: Admitting: Internal Medicine

## 2024-07-08 VITALS — BP 120/80 | HR 80 | Temp 98.5°F | Ht 66.0 in | Wt 265.0 lb

## 2024-07-08 DIAGNOSIS — R972 Elevated prostate specific antigen [PSA]: Secondary | ICD-10-CM | POA: Diagnosis not present

## 2024-07-08 DIAGNOSIS — Z23 Encounter for immunization: Secondary | ICD-10-CM | POA: Diagnosis not present

## 2024-07-08 DIAGNOSIS — N1831 Chronic kidney disease, stage 3a: Secondary | ICD-10-CM | POA: Diagnosis not present

## 2024-07-08 DIAGNOSIS — E538 Deficiency of other specified B group vitamins: Secondary | ICD-10-CM

## 2024-07-08 DIAGNOSIS — E7800 Pure hypercholesterolemia, unspecified: Secondary | ICD-10-CM | POA: Diagnosis not present

## 2024-07-08 DIAGNOSIS — I10 Essential (primary) hypertension: Secondary | ICD-10-CM | POA: Diagnosis not present

## 2024-07-08 DIAGNOSIS — E291 Testicular hypofunction: Secondary | ICD-10-CM

## 2024-07-08 DIAGNOSIS — R739 Hyperglycemia, unspecified: Secondary | ICD-10-CM

## 2024-07-08 DIAGNOSIS — E559 Vitamin D deficiency, unspecified: Secondary | ICD-10-CM

## 2024-07-08 NOTE — Patient Instructions (Signed)
You had the flu shot today  Please continue all other medications as before, and refills have been done if requested.  Please have the pharmacy call with any other refills you may need.  Please continue your efforts at being more active, low cholesterol diet, and weight control.  You are otherwise up to date with prevention measures today.  Please keep your appointments with your specialists as you may have planned  Please make an Appointment to return in 6 months, or sooner if needed, also with Lab Appointment for testing done 3-5 days before at the FIRST FLOOR Lab (so this is for TWO appointments - please see the scheduling desk as you leave)  

## 2024-07-08 NOTE — Progress Notes (Signed)
 Patient ID: Darren Black, male   DOB: 1966/08/15, 58 y.o.   MRN: [id number]        Chief Complaint: follow up HTN, HLD and hyperglycemia, ckd3a       HPI:  Darren Black is a 58 y.o. male here overall doing ok,  Pt denies chest pain, increased sob or doe, wheezing, orthopnea, PND, increased LE swelling, palpitations, dizziness or syncope.   Pt denies polydipsia, polyuria, or new focal neuro s/s.    Pt denies fever, wt loss, night sweats, loss of appetite, or other constitutional symptoms   S/p bladder surgury 2024, then umbilical hernia repair successful.  Denies worsening reflux, abd pain, dysphagia, n/v, bowel change or blood.  Denies urinary symptoms such as dysuria, frequency, urgency, flank pain, hematuria or n/v, fever, chills.   Has been able to lose several lbs with better diet.   Due for flu shot  Pt continues to have recurring LBP without change in severity, bowel or bladder change, fever, wt loss,  worsening LE pain/numbness/weakness, gait change or falls.  Wt Readings from Last 3 Encounters:  07/08/24 265 lb (120.2 kg)  06/14/24 272 lb 9.6 oz (123.7 kg)  04/15/24 271 lb 12.8 oz (123.3 kg)   BP Readings from Last 3 Encounters:  07/08/24 120/80  06/14/24 137/87  04/15/24 112/79         Past Medical History:  Diagnosis Date   Anxiety    BPH (benign prostatic hypertrophy)    Chronic lower back pain    nerve damage w/ leg pain   Depression    Diarrhea 03/23/2019   Family history of adverse reaction to anesthesia    MOTHER--- HARD TO WAKE   GERD (gastroesophageal reflux disease)    History of panic attacks    Hyperlipidemia    Hypertension    Meatal stenosis    OSA on CPAP    moderate to severe OSA  per study 04-28-2007 uses CPAP   PONV (postoperative nausea and vomiting)    Hard to wake up   Past Surgical History:  Procedure Laterality Date   APPENDECTOMY  2003   BREATH TEK H PYLORI  04/27/2012   Procedure: BREATH TEK H PYLORI;  Surgeon: Donnice KATHEE Lunger, MD;   Location: [facility];  Service: General;  Laterality: N/A;   CHOLECYSTECTOMY N/A 03/24/2019   Procedure: LAPAROSCOPIC CHOLECYSTECTOMY WITH INTRAOPERATIVE CHOLANGIOGRAM;  Surgeon: Belinda Donnice, MD;  [facility];  Service: General;  Laterality: N/A;   CYSTOSCOPY W/ URETERAL STENT PLACEMENT Left 05/13/2023   Procedure: CYSTOSCOPY WITH RETROGRADE PYELOGRAM/URETERAL STENT PLACEMENT AND INDOCYANINE GREEN DYE INJECTION;  Surgeon: Alvaro Ricardo KATHEE Mickey., MD;  Location: [facility];  Service: Urology;  Laterality: Left;   CYSTOSCOPY WITH URETHRAL DILATATION N/A 04/12/2015   Procedure: CYSTOSCOPY WITH URETHRAL DILATATION, RETROGRADE AND URETHROGRAM WITH BLADDER BIOPSY;  Surgeon: Norleen Seltzer, MD;  Location: [facility];  Service: Urology;  Laterality: N/A;   ROBOTIC ASSISTED LAPAROSCOPIC BLADDER DIVERTICULECTOMY N/A 05/13/2023   Procedure: XI ROBOTIC ASSISTED LAPAROSCOPIC PARTIAL CYSTECTOMY, OPNE UMBILICAL HERNIA REPAIR;  Surgeon: Alvaro Ricardo KATHEE Mickey., MD;  Location: [facility];  Service: Urology;  Laterality: N/A;   ROTATOR CUFF REPAIR Right 01-21-2012   TRANSTHORACIC ECHOCARDIOGRAM  03-26-2007   normal LVSF, ef 60%,  mild AV calcification without stenosis,  mild MR and TR,  mild LAE   URETHROGRAM N/A 04/12/2015   Procedure: URETHROGRAM;  Surgeon: Norleen Seltzer, MD;  Location[facility];  Service: Urology;  Laterality: N/A;  VENTRAL HERNIA REPAIR N/A 10/06/2023   Procedure: LAPAROSCOPIC VENTRAL HERNIA repair with mesh;  Surgeon: Signe Mitzie LABOR, MD;  Location: [facility];  Service: General;  Laterality: N/A;    reports that he has never smoked. He has never used smokeless tobacco. He reports that he does not currently use alcohol. He reports that he does not use drugs. family history includes Cancer in his father; Coronary artery disease in his father; Diabetes in an other family member; Hyperlipidemia in an other family member; Hypertension in an other family member; Stroke in an other  family member. Allergies  Allergen Reactions   Gabapentin  Swelling   Crestor  [Rosuvastatin  Calcium ] Other (See Comments)    abd pain   Rosuvastatin  Calcium      Other Reaction(s): Other (See Comments)  abd pain   Current Outpatient Medications on File Prior to Visit  Medication Sig Dispense Refill   amLODipine  (NORVASC ) 10 MG tablet Take 1 tablet by mouth once daily 90 tablet 3   anastrozole (ARIMIDEX) 1 MG tablet Take 1 mg by mouth once a week.     Ascorbic Acid (VITAMIN C) 1000 MG tablet Take 1,000 mg by mouth daily.     ASTRAGALUS PO Take 500 mg by mouth daily.     benazepril  (LOTENSIN ) 40 MG tablet Take 1 tablet by mouth once daily 90 tablet 0   cetirizine (ZYRTEC) 10 MG tablet Take 10 mg by mouth daily as needed for allergies.     Cholecalciferol (VITAMIN D3 ULTRA STRENGTH) 125 MCG (5000 UT) capsule Take 5,000 Units by mouth daily.     Coenzyme Q10 (CO Q-10) 300 MG CAPS Take 300 mg by mouth daily.     cyclobenzaprine  (FLEXERIL ) 10 MG tablet Take 1 tablet (10 mg total) by mouth at bedtime as needed for muscle spasms. 30 tablet 5   diazepam  (VALIUM ) 5 MG tablet Take 1 tablet (5 mg total) by mouth 2 (two) times daily. To take if has severe pain/spasms don't take with Flexeril -or other benzos 10 tablet 0   Docusate Sodium  (DSS) 100 MG CAPS Take 100 mg by mouth.     doxazosin  (CARDURA ) 4 MG tablet TAKE 1 TABLET BY MOUTH AT BEDTIME 90 tablet 3   DULoxetine  (CYMBALTA ) 60 MG capsule Take 1 capsule by mouth once daily in the morning 90 capsule 0   ezetimibe  (ZETIA ) 10 MG tablet Take 1 tablet by mouth once daily 90 tablet 0   fluticasone  (FLONASE ) 50 MCG/ACT nasal spray Use 2 spray(s) in each nostril once daily (Patient taking differently: Place 2 sprays into both nostrils daily as needed for allergies.) 16 g 0   GARLIC PO Take 2 tablets by mouth 2 (two) times daily.     Ginger, Zingiber officinalis, (GINGER ROOT) 550 MG CAPS Take 550 mg by mouth daily.     hydrochlorothiazide  (HYDRODIURIL )  25 MG tablet Take 1/2 (one-half) tablet by mouth once daily 45 tablet 0   LORazepam  (ATIVAN ) 1 MG tablet Take 1 tablet by mouth twice daily as needed for anxiety 60 tablet 2   Misc Natural Products (BEET ROOT PO) Take 800 mg by mouth daily.     Misc Natural Products (PROSTATE) CAPS Take 1 tablet by mouth daily. Prostate urinary health     Multiple Vitamin (MULTIVITAMIN WITH MINERALS) TABS tablet Take 1 tablet by mouth daily.     Omega-3 Fatty Acids (FISH OIL) 1200 MG CAPS Take 1,200 mg by mouth 2 (two) times daily.     OVER THE COUNTER MEDICATION  Take 1 tablet by mouth daily. Kidney Support supplement     oxyCODONE -acetaminophen  (PERCOCET) 5-325 MG tablet Take 1 tablet by mouth every 8 (eight) hours as needed for severe pain (pain score 7-10). For- chronic pain- G89.21 90 tablet 0   oxyCODONE -acetaminophen  (PERCOCET) 5-325 MG tablet Take 1 tablet by mouth every 6 (six) hours as needed for moderate pain (pain score 4-6). G89.21- chronic pain. Do Not Fill Before 07/15/2024 90 tablet 0   potassium chloride  (KLOR-CON ) 10 MEQ tablet Take 2 tablets by mouth once daily 180 tablet 3   Prasterone, DHEA, (DHEA PO) Take 200 mg by mouth 2 (two) times daily.     pravastatin  (PRAVACHOL ) 80 MG tablet Take 1 tablet by mouth once daily 90 tablet 3   predniSONE  (DELTASONE ) 20 MG tablet Take 1 tablet (20 mg total) by mouth daily with breakfast. Only take when pain flares occur- 5 tablet 0   Probiotic Product (PROBIOTIC MULTI-ENZYME) TABS Take 1 tablet by mouth daily.     Protein POWD Take 1 Scoop by mouth daily as needed (instead of meal). Also use protein drink or bar     Saw Palmetto 1000 MG CAPS Take 1,000 mg by mouth 2 (two) times daily.     SEMAGLUTIDE PO Place 1 Dose under the tongue daily.     tamsulosin  (FLOMAX ) 0.4 MG CAPS capsule Take 1 capsule by mouth once daily 90 capsule 0   testosterone  cypionate (DEPOTESTOSTERONE CYPIONATE) 200 MG/ML injection Inject 100 mg into the muscle 2 (two) times a week.      tirzepatide  (ZEPBOUND ) 2.5 MG/0.5ML Pen Inject 2.5 mg into the skin.     Turmeric 500 MG CAPS Take 500 mg by mouth daily.     VITAMIN K PO Take 1 capsule by mouth 2 (two) times daily.     No current facility-administered medications on file prior to visit.        ROS:  All others reviewed and negative.  Objective        PE:  BP 120/80 (BP Location: Right Arm, Patient Position: Sitting, Cuff Size: Normal)   Pulse 80   Temp 98.5 F (36.9 C) (Oral)   Ht 5' 6 (1.676 m)   Wt 265 lb (120.2 kg)   SpO2 95%   BMI 42.77 kg/m                 Constitutional: Pt appears in NAD               HENT: Head: NCAT.                Right Ear: External ear normal.                 Left Ear: External ear normal.                Eyes: . Pupils are equal, round, and reactive to light. Conjunctivae and EOM are normal               Nose: without d/c or deformity               Neck: Neck supple. Gross normal ROM               Cardiovascular: Normal rate and regular rhythm.                 Pulmonary/Chest: Effort normal and breath sounds without rales or wheezing.  Abd:  Soft, NT, ND, + BS, no organomegaly               Neurological: Pt is alert. At baseline orientation, motor grossly intact               Skin: Skin is warm. No rashes, no other new lesions, LE edema - none               Psychiatric: Pt behavior is normal without agitation   Micro: none  Cardiac tracings I have personally interpreted today:  none  Pertinent Radiological findings (summarize): none   Lab Results  Component Value Date   WBC 6.2 07/01/2024   HGB 17.3 (H) 07/01/2024   HCT 51.1 07/01/2024   PLT 177.0 07/01/2024   GLUCOSE 74 07/01/2024   CHOL 115 07/01/2024   TRIG 76.0 07/01/2024   HDL 39.80 07/01/2024   LDLDIRECT 139.0 01/02/2016   LDLCALC 60 07/01/2024   ALT 23 07/01/2024   AST 21 07/01/2024   NA 139 07/01/2024   K 3.8 07/01/2024   CL 99 07/01/2024   CREATININE 1.65 (H) 07/01/2024   BUN 13  07/01/2024   CO2 31 07/01/2024   TSH 1.71 01/04/2024   PSA 2.89 07/01/2024   HGBA1C 5.3 07/01/2024   MICROALBUR 5.6 (H) 01/04/2024   Assessment/Plan:  Darren Black is a 58 y.o. White or Caucasian [1] male with  has a past medical history of Anxiety, BPH (benign prostatic hypertrophy), Chronic lower back pain, Depression, Diarrhea (03/23/2019), Family history of adverse reaction to anesthesia, GERD (gastroesophageal reflux disease), History of panic attacks, Hyperlipidemia, Hypertension, Meatal stenosis, OSA on CPAP, and PONV (postoperative nausea and vomiting).  Hyperlipidemia Lab Results  Component Value Date   LDLCALC 60 07/01/2024   Stable, pt to continue current statin pravachol  80 qd  Hyperglycemia Lab Results  Component Value Date   HGBA1C 5.3 07/01/2024   Stable, pt to continue current medical treatment  - diet, wt control   Essential hypertension BP Readings from Last 3 Encounters:  07/08/24 120/80  06/14/24 137/87  04/15/24 112/79   Stable, pt to continue medical treatment lotensin  40 mg every day, hct 12.5 every day, norvasc  10 qd   CKD (chronic kidney disease) stage 3, GFR 30-59 ml/min (HCC) Ckd3a Lab Results  Component Value Date   CREATININE 1.65 (H) 07/01/2024   Stable overall, cont to avoid nephrotoxins  Followup: Return in about 6 months (around 01/05/2025).  Lynwood Norleen, MD 07/09/2024 1:04 PM [facility]

## 2024-07-09 ENCOUNTER — Encounter: Payer: Self-pay | Admitting: Internal Medicine

## 2024-07-09 NOTE — Assessment & Plan Note (Signed)
 BP Readings from Last 3 Encounters:  07/08/24 120/80  06/14/24 137/87  04/15/24 112/79   Stable, pt to continue medical treatment lotensin  40 mg every day, hct 12.5 every day, norvasc  10 qd

## 2024-07-09 NOTE — Assessment & Plan Note (Signed)
 Lab Results  Component Value Date   LDLCALC 60 07/01/2024   Stable, pt to continue current statin pravachol  80 qd

## 2024-07-09 NOTE — Assessment & Plan Note (Signed)
 Ckd3a Lab Results  Component Value Date   CREATININE 1.65 (H) 07/01/2024   Stable overall, cont to avoid nephrotoxins

## 2024-07-09 NOTE — Assessment & Plan Note (Signed)
 Lab Results  Component Value Date   HGBA1C 5.3 07/01/2024   Stable, pt to continue current medical treatment  - diet, wt control

## 2024-07-12 ENCOUNTER — Other Ambulatory Visit: Payer: Self-pay | Admitting: Internal Medicine

## 2024-07-13 ENCOUNTER — Other Ambulatory Visit: Payer: Self-pay | Admitting: Internal Medicine

## 2024-07-13 ENCOUNTER — Other Ambulatory Visit: Payer: Self-pay

## 2024-07-21 ENCOUNTER — Other Ambulatory Visit: Payer: Self-pay

## 2024-07-21 ENCOUNTER — Other Ambulatory Visit: Payer: Self-pay | Admitting: Internal Medicine

## 2024-08-02 ENCOUNTER — Other Ambulatory Visit: Payer: Self-pay | Admitting: Internal Medicine

## 2024-08-02 ENCOUNTER — Other Ambulatory Visit: Payer: Self-pay

## 2024-08-05 DIAGNOSIS — G4733 Obstructive sleep apnea (adult) (pediatric): Secondary | ICD-10-CM | POA: Diagnosis not present

## 2024-08-11 ENCOUNTER — Telehealth: Payer: Self-pay | Admitting: *Deleted

## 2024-08-11 MED ORDER — OXYCODONE-ACETAMINOPHEN 5-325 MG PO TABS: 1.0000 | ORAL_TABLET | Freq: Three times a day (TID) | ORAL | 0 refills | Status: DC | PRN

## 2024-08-11 NOTE — Telephone Encounter (Signed)
 Mrs Cortina called for a refil on Mr Minckler's oxycodone .

## 2024-08-15 ENCOUNTER — Encounter: Attending: Registered Nurse | Admitting: Physical Medicine and Rehabilitation

## 2024-08-15 ENCOUNTER — Encounter: Payer: Self-pay | Admitting: Physical Medicine and Rehabilitation

## 2024-08-15 VITALS — BP 126/86 | HR 83 | Ht 66.0 in | Wt 266.0 lb

## 2024-08-15 DIAGNOSIS — M4800 Spinal stenosis, site unspecified: Secondary | ICD-10-CM | POA: Diagnosis not present

## 2024-08-15 DIAGNOSIS — Z125 Encounter for screening for malignant neoplasm of prostate: Secondary | ICD-10-CM | POA: Diagnosis not present

## 2024-08-15 DIAGNOSIS — M542 Cervicalgia: Secondary | ICD-10-CM | POA: Diagnosis not present

## 2024-08-15 DIAGNOSIS — M5442 Lumbago with sciatica, left side: Secondary | ICD-10-CM | POA: Diagnosis not present

## 2024-08-15 DIAGNOSIS — G8929 Other chronic pain: Secondary | ICD-10-CM | POA: Diagnosis present

## 2024-08-15 DIAGNOSIS — M5441 Lumbago with sciatica, right side: Secondary | ICD-10-CM

## 2024-08-15 DIAGNOSIS — G894 Chronic pain syndrome: Secondary | ICD-10-CM

## 2024-08-15 DIAGNOSIS — M546 Pain in thoracic spine: Secondary | ICD-10-CM | POA: Diagnosis not present

## 2024-08-15 DIAGNOSIS — M545 Low back pain, unspecified: Secondary | ICD-10-CM | POA: Insufficient documentation

## 2024-08-15 MED ORDER — OXYCODONE-ACETAMINOPHEN 5-325 MG PO TABS: 1.0000 | ORAL_TABLET | Freq: Four times a day (QID) | ORAL | 0 refills | Status: AC | PRN

## 2024-08-15 MED ORDER — OXYCODONE-ACETAMINOPHEN 5-325 MG PO TABS: 1.0000 | ORAL_TABLET | Freq: Three times a day (TID) | ORAL | 0 refills | Status: AC | PRN

## 2024-08-15 NOTE — Progress Notes (Signed)
 Subjective:    Patient ID: Darren Black, male    DOB: 1966/01/08, 58 y.o.   MRN: [id number]  HPI  Pt is a 58 yr old male with hx of R shoulder pain due to complete supraspinatus tear HTN; no DM; HLD, BPH, on Flomax  and Prazosin; and allergies and low testosterone , depression/anxiety on Lorazepam /Cymbalta ; ;  Here for f/u of chronic low back pain due to lumbar radiculopathy.     In a lot of pain- destroyed by the weather.   Cold weather is really affecting him.    Marked more spots than usually does - distal thighs, calves and hamstrings- and low back, mid back and neck.  Asks wife to rub neck all the time-  massager too much vibration.  Roller is good.   Has tried tennis ball- against wall- but rather her do the roller than tennis ball.   Has felt somewhat better when takes muscle relaxants- 15% is a better improvement- than expected and has gotten used to them.   PCP is retiring- 18 years or so that he's been going to him.  Using Flexeril -  has gotten used to it. Taking 2x/day on bad days- usually 1x/day at night- helps him sleep.    Didn't need Prednisone /valium  - because muscle relaxant helped enough to avoid it.    Not waking up with bad spasms anymore   Urinary UTI's doing better.  Will see Urologist next Monday.  Seeing/getting labs before sees Urology.        Pain Inventory Average Pain 9 Pain Right Now 9 My pain is constant, sharp, dull, stabbing, and aching  In the last 24 hours, has pain interfered with the following? General activity 9 Relation with others 9 Enjoyment of life 9 What TIME of day is your pain at its worst? morning , daytime, evening, and night Sleep (in general) Poor  Pain is worse with: walking, bending, sitting, inactivity, standing, and some activites Pain improves with: medication Relief from Meds: 1  Family History  Problem Relation Age of Onset   Coronary artery disease Father    Cancer Father        anaplastic thyroid   cancer   Stroke Other        1st degree relative   Diabetes Other        1st degree relative   Hypertension Other    Hyperlipidemia Other    Social History   Socioeconomic History   Marital status: Married    Spouse name: Kallie   Number of children: Not on file   Years of education: Not on file   Highest education level: Not on file  Occupational History   Occupation: disabled, LBP and anxiety  Tobacco Use   Smoking status: Never   Smokeless tobacco: Never  Vaping Use   Vaping status: Never Used  Substance and Sexual Activity   Alcohol use: Not Currently   Drug use: No   Sexual activity: Not on file  Other Topics Concern   Not on file  Social History Narrative   Live with wife/2025   Social Drivers of Health   Tobacco Use: Low Risk (07/09/2024)   Patient History    Smoking Tobacco Use: Never    Smokeless Tobacco Use: Never    Passive Exposure: Not on file  Financial Resource Strain: High Risk (04/11/2024)   Overall Financial Resource Strain (CARDIA)    Difficulty of Paying Living Expenses: Very hard  Food Insecurity: No Food Insecurity (04/11/2024)   Epic  Worried About Programme Researcher, Broadcasting/film/video in the Last Year: Never true    Ran Out of Food in the Last Year: Never true  Transportation Needs: No Transportation Needs (04/11/2024)   Epic    Lack of Transportation (Medical): No    Lack of Transportation (Non-Medical): No  Physical Activity: Inactive (04/11/2024)   Exercise Vital Sign    Days of Exercise per Week: 0 days    Minutes of Exercise per Session: 0 min  Stress: No Stress Concern Present (04/11/2024)   [employer] of Occupational Health - Occupational Stress Questionnaire    Feeling of Stress: Only a little  Social Connections: Socially Isolated (04/11/2024)   Social Connection and Isolation Panel    Frequency of Communication with Friends and Family: Once a week    Frequency of Social Gatherings with Friends and Family: Once a week    Attends Religious  Services: Never    Database Administrator or Organizations: No    Attends Banker Meetings: Never    Marital Status: Married  Depression (PHQ2-9): Low Risk (07/08/2024)   Depression (PHQ2-9)    PHQ-2 Score: 0  Recent Concern: Depression (PHQ2-9) - Medium Risk (04/11/2024)   Depression (PHQ2-9)    PHQ-2 Score: 6  Alcohol Screen: Low Risk (04/11/2024)   Alcohol Screen    Last Alcohol Screening Score (AUDIT): 0  Housing: Unknown (04/11/2024)   Epic    Unable to Pay for Housing in the Last Year: No    Number of Times Moved in the Last Year: Not on file    Homeless in the Last Year: No  Utilities: Not At Risk (04/11/2024)   Epic    Threatened with loss of utilities: No  Health Literacy: Inadequate Health Literacy (04/11/2024)   B1300 Health Literacy    Frequency of need for help with medical instructions: Always   Past Surgical History:  Procedure Laterality Date   APPENDECTOMY  2003   BREATH SHIRLEAN VEAR LORA  04/27/2012   Procedure: BREATH TEK H PYLORI;  Surgeon: Donnice KATHEE Lunger, MD;  Location: [facility];  Service: General;  Laterality: N/A;   CHOLECYSTECTOMY N/A 03/24/2019   Procedure: LAPAROSCOPIC CHOLECYSTECTOMY WITH INTRAOPERATIVE CHOLANGIOGRAM;  Surgeon: Belinda Donnice, MD;  Location: [facility];  Service: General;  Laterality: N/A;   CYSTOSCOPY W/ URETERAL STENT PLACEMENT Left 05/13/2023   Procedure: CYSTOSCOPY WITH RETROGRADE PYELOGRAM/URETERAL STENT PLACEMENT AND INDOCYANINE GREEN DYE INJECTION;  Surgeon: Alvaro Ricardo KATHEE Mickey., MD;  Location: [facility];  Service: Urology;  Laterality: Left;   CYSTOSCOPY WITH URETHRAL DILATATION N/A 04/12/2015   Procedure: CYSTOSCOPY WITH URETHRAL DILATATION, RETROGRADE AND URETHROGRAM WITH BLADDER BIOPSY;  Surgeon: Darren Seltzer, MD;  Location[facility];  Service: Urology;  Laterality: N/A;   ROBOTIC ASSISTED LAPAROSCOPIC BLADDER DIVERTICULECTOMY N/A 05/13/2023   Procedure: XI ROBOTIC ASSISTED LAPAROSCOPIC PARTIAL CYSTECTOMY, OPNE  UMBILICAL HERNIA REPAIR;  Surgeon: Alvaro Ricardo KATHEE Mickey., MD;  Location: [facility];  Service: Urology;  Laterality: N/A;   ROTATOR CUFF REPAIR Right 01-21-2012   TRANSTHORACIC ECHOCARDIOGRAM  03-26-2007   normal LVSF, ef 60%,  mild AV calcification without stenosis,  mild MR and TR,  mild LAE   URETHROGRAM N/A 04/12/2015   Procedure: URETHROGRAM;  Surgeon: Darren Seltzer, MD;  Location: [facility];  Service: Urology;  Laterality: N/A;   VENTRAL HERNIA REPAIR N/A 10/06/2023   Procedure: LAPAROSCOPIC VENTRAL HERNIA repair with mesh;  Surgeon: Signe Mitzie LABOR, MD;  Location: [facility];  Service: General;  Laterality: N/A;   Past Surgical History:  Procedure Laterality Date   APPENDECTOMY  2003   BREATH TEK H PYLORI  04/27/2012   Procedure: BREATH TEK H PYLORI;  Surgeon: Donnice KATHEE Lunger, MD;  Location: [facility];  Service: General;  Laterality: N/A;   CHOLECYSTECTOMY N/A 03/24/2019   Procedure: LAPAROSCOPIC CHOLECYSTECTOMY WITH INTRAOPERATIVE CHOLANGIOGRAM;  Surgeon: Belinda Donnice, MD;  Location: [facility];  Service: General;  Laterality: N/A;   CYSTOSCOPY W/ URETERAL STENT PLACEMENT Left 05/13/2023   Procedure: CYSTOSCOPY WITH RETROGRADE PYELOGRAM/URETERAL STENT PLACEMENT AND INDOCYANINE GREEN DYE INJECTION;  Surgeon: Alvaro Ricardo KATHEE Mickey., MD;  Location: [facility];  Service: Urology;  Laterality: Left;   CYSTOSCOPY WITH URETHRAL DILATATION N/A 04/12/2015   Procedure: CYSTOSCOPY WITH URETHRAL DILATATION, RETROGRADE AND URETHROGRAM WITH BLADDER BIOPSY;  Surgeon: Darren Seltzer, MD;  Location: [facility];  Service: Urology;  Laterality: N/A;   ROBOTIC ASSISTED LAPAROSCOPIC BLADDER DIVERTICULECTOMY N/A 05/13/2023   Procedure: XI ROBOTIC ASSISTED LAPAROSCOPIC PARTIAL CYSTECTOMY, OPNE UMBILICAL HERNIA REPAIR;  Surgeon: Alvaro Ricardo KATHEE Mickey., MD;  Location: [facility];  Service: Urology;  Laterality: N/A;   ROTATOR CUFF REPAIR Right 01-21-2012   TRANSTHORACIC ECHOCARDIOGRAM  03-26-2007   normal  LVSF, ef 60%,  mild AV calcification without stenosis,  mild MR and TR,  mild LAE   URETHROGRAM N/A 04/12/2015   Procedure: URETHROGRAM;  Surgeon: Darren Seltzer, MD;  Location: [facility];  Service: Urology;  Laterality: N/A;   VENTRAL HERNIA REPAIR N/A 10/06/2023   Procedure: LAPAROSCOPIC VENTRAL HERNIA repair with mesh;  Surgeon: Signe Mitzie LABOR, MD;  Location: [facility];  Service: General;  Laterality: N/A;   Past Medical History:  Diagnosis Date   Anxiety    BPH (benign prostatic hypertrophy)    Chronic lower back pain    nerve damage w/ leg pain   Depression    Diarrhea 03/23/2019   Family history of adverse reaction to anesthesia    MOTHER--- HARD TO WAKE   GERD (gastroesophageal reflux disease)    History of panic attacks    Hyperlipidemia    Hypertension    Meatal stenosis    OSA on CPAP    moderate to severe OSA  per study 04-28-2007 uses CPAP   PONV (postoperative nausea and vomiting)    Hard to wake up   There were no vitals taken for this visit.  Opioid Risk Score:   Fall Risk Score:  `1  Depression screen PHQ 2/9     07/08/2024    2:27 PM 04/11/2024    1:41 PM 02/17/2024    1:04 PM 01/06/2024    2:23 PM 01/06/2024    2:01 PM 12/23/2023   11:27 AM 10/26/2023    1:39 PM  Depression screen PHQ 2/9  Decreased Interest 0 0 1 0 0 0 1  Down, Depressed, Hopeless 0 1 1 0 0 0 1  PHQ - 2 Score 0 1 2 0 0 0 2  Altered sleeping  1       Tired, decreased energy  1       Change in appetite  0       Feeling bad or failure about yourself   0       Trouble concentrating  3       Moving slowly or fidgety/restless  0       Suicidal thoughts  0       PHQ-9 Score  6  Difficult doing work/chores  Not difficult at all          Data saved with a previous flowsheet row definition    Review of Systems  Musculoskeletal:  Positive for back pain and neck pain.       Pain in both legs, right hip pain  All other systems reviewed and are negative.      Objective:    Physical Exam   Awake, alert, appropriate, NAD Accompanied by wife No assistive device Has trigger points in calves, distal thighs/vastus medialis-       Assessment & Plan:   Pt is a 58 yr old male with hx of R shoulder pain due to complete supraspinatus tear HTN; no DM; HLD, BPH, on Flomax  and Prazosin; and allergies and low testosterone , depression/anxiety on Lorazepam /Cymbalta ; ;  Here for f/u of chronic low back pain due to lumbar radiculopathy.     Pain meds don't treat myofascial pain- muscle relaxants only work  15-20% better- bodywork is the most important to work.  2.  Con't Percocet 5/325 mg 3x/day-  up to 3x/day- as needed- #90- 2 months of pain meds-   3.   Do rollers- even rolling pin- on calves, thighs- TOWARDS heart!   4. If has another flare- give me a call and will steroids and Valium -    5.  F/U in 2 months with Fidela and 4 months with me-    I spent a total of  21   minutes on total care today- >50% coordination of care- due to  d/w pt about pain, muscle relaxants-  and how to treat it-  And refill of meds

## 2024-08-15 NOTE — Patient Instructions (Signed)
 Pt is a 58 yr old male with hx of R shoulder pain due to complete supraspinatus tear HTN; no DM; HLD, BPH, on Flomax  and Prazosin; and allergies and low testosterone , depression/anxiety on Lorazepam /Cymbalta ; ;  Here for f/u of chronic low back pain due to lumbar radiculopathy.     Pain meds don't treat myofascial pain- muscle relaxants only work  15-20% better- bodywork is the most important to work.  2.  Con't Percocet 5/325 mg 3x/day-  up to 3x/day- as needed- #90- 2 months of pain meds-   3.   Do rollers- even rolling pin- on calves, thighs- TOWARDS heart!   4. If has another flare- give me a call and will steroids and Valium -    5.  F/U in 2 months with Fidela and 4months with me-

## 2024-08-16 DIAGNOSIS — Z125 Encounter for screening for malignant neoplasm of prostate: Secondary | ICD-10-CM | POA: Diagnosis not present

## 2024-08-22 DIAGNOSIS — R3915 Urgency of urination: Secondary | ICD-10-CM | POA: Diagnosis not present

## 2024-08-22 DIAGNOSIS — N401 Enlarged prostate with lower urinary tract symptoms: Secondary | ICD-10-CM | POA: Diagnosis not present

## 2024-08-22 DIAGNOSIS — N323 Diverticulum of bladder: Secondary | ICD-10-CM | POA: Diagnosis not present

## 2024-08-26 ENCOUNTER — Other Ambulatory Visit: Payer: Self-pay | Admitting: Internal Medicine

## 2024-09-03 ENCOUNTER — Other Ambulatory Visit: Payer: Self-pay | Admitting: Internal Medicine

## 2024-09-05 ENCOUNTER — Other Ambulatory Visit: Payer: Self-pay

## 2024-09-07 ENCOUNTER — Other Ambulatory Visit: Payer: Self-pay | Admitting: Internal Medicine

## 2024-09-07 ENCOUNTER — Other Ambulatory Visit: Payer: Self-pay

## 2024-09-23 ENCOUNTER — Other Ambulatory Visit: Payer: Self-pay | Admitting: Internal Medicine

## 2024-10-05 ENCOUNTER — Other Ambulatory Visit: Payer: Self-pay | Admitting: Internal Medicine

## 2024-10-13 ENCOUNTER — Encounter: Admitting: Family Medicine

## 2024-10-17 ENCOUNTER — Encounter: Admitting: Registered Nurse

## 2024-12-14 ENCOUNTER — Encounter: Admitting: Physical Medicine and Rehabilitation

## 2025-04-12 ENCOUNTER — Ambulatory Visit
